# Patient Record
Sex: Male | Born: 1950 | Race: White | Hispanic: No | Smoking: Former smoker
Health system: Southern US, Community
[De-identification: ages and names within clinical notes are randomized; demographics above are authoritative.]

## PROBLEM LIST (undated history)

## (undated) DIAGNOSIS — I509 Heart failure, unspecified: Secondary | ICD-10-CM

## (undated) DIAGNOSIS — E119 Type 2 diabetes mellitus without complications: Secondary | ICD-10-CM

## (undated) DIAGNOSIS — M419 Scoliosis, unspecified: Secondary | ICD-10-CM

## (undated) DIAGNOSIS — I10 Essential (primary) hypertension: Secondary | ICD-10-CM

## (undated) DIAGNOSIS — E039 Hypothyroidism, unspecified: Secondary | ICD-10-CM

## (undated) DIAGNOSIS — T8859XA Other complications of anesthesia, initial encounter: Secondary | ICD-10-CM

## (undated) DIAGNOSIS — J45909 Unspecified asthma, uncomplicated: Secondary | ICD-10-CM

## (undated) DIAGNOSIS — Z8619 Personal history of other infectious and parasitic diseases: Secondary | ICD-10-CM

## (undated) DIAGNOSIS — E0590 Thyrotoxicosis, unspecified without thyrotoxic crisis or storm: Secondary | ICD-10-CM

## (undated) DIAGNOSIS — M545 Low back pain, unspecified: Secondary | ICD-10-CM

## (undated) DIAGNOSIS — N289 Disorder of kidney and ureter, unspecified: Secondary | ICD-10-CM

## (undated) DIAGNOSIS — Z85828 Personal history of other malignant neoplasm of skin: Secondary | ICD-10-CM

## (undated) DIAGNOSIS — G629 Polyneuropathy, unspecified: Secondary | ICD-10-CM

## (undated) DIAGNOSIS — G8929 Other chronic pain: Secondary | ICD-10-CM

## (undated) DIAGNOSIS — G4730 Sleep apnea, unspecified: Secondary | ICD-10-CM

## (undated) HISTORY — DX: Disorder of kidney and ureter, unspecified: N28.9

## (undated) HISTORY — DX: Thyrotoxicosis, unspecified without thyrotoxic crisis or storm: E05.90

## (undated) HISTORY — DX: Scoliosis, unspecified: M41.9

## (undated) HISTORY — PX: TONSILLECTOMY AND ADENOIDECTOMY: SUR1326

## (undated) HISTORY — DX: Essential (primary) hypertension: I10

## (undated) HISTORY — DX: Sleep apnea, unspecified: G47.30

## (undated) HISTORY — DX: Personal history of other malignant neoplasm of skin: Z85.828

## (undated) HISTORY — DX: Personal history of other infectious and parasitic diseases: Z86.19

---
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
## 2003-04-25 HISTORY — PX: NASAL SINUS SURGERY: SHX719

## 2004-01-28 ENCOUNTER — Ambulatory Visit: Payer: Self-pay | Admitting: Family Medicine

## 2004-06-16 ENCOUNTER — Other Ambulatory Visit: Payer: Self-pay

## 2004-06-23 ENCOUNTER — Ambulatory Visit: Payer: Self-pay | Admitting: Otolaryngology

## 2004-10-06 ENCOUNTER — Ambulatory Visit: Payer: Self-pay | Admitting: Otolaryngology

## 2005-01-06 ENCOUNTER — Ambulatory Visit: Payer: Self-pay | Admitting: Unknown Physician Specialty

## 2005-01-06 LAB — HM COLONOSCOPY

## 2009-10-27 ENCOUNTER — Ambulatory Visit: Payer: Self-pay | Admitting: Family Medicine

## 2010-02-10 ENCOUNTER — Ambulatory Visit: Payer: Self-pay | Admitting: Family Medicine

## 2011-02-09 ENCOUNTER — Observation Stay: Payer: Self-pay | Admitting: Internal Medicine

## 2011-06-06 ENCOUNTER — Ambulatory Visit: Payer: Self-pay | Admitting: Family Medicine

## 2011-06-21 ENCOUNTER — Ambulatory Visit: Payer: Self-pay | Admitting: Family Medicine

## 2011-07-25 ENCOUNTER — Encounter: Payer: Self-pay | Admitting: Family Medicine

## 2011-08-01 DIAGNOSIS — C44320 Squamous cell carcinoma of skin of unspecified parts of face: Secondary | ICD-10-CM | POA: Insufficient documentation

## 2011-08-01 DIAGNOSIS — C4330 Malignant melanoma of unspecified part of face: Secondary | ICD-10-CM | POA: Insufficient documentation

## 2011-08-23 ENCOUNTER — Encounter: Payer: Self-pay | Admitting: Family Medicine

## 2011-09-23 ENCOUNTER — Encounter: Payer: Self-pay | Admitting: Family Medicine

## 2011-10-23 ENCOUNTER — Encounter: Payer: Self-pay | Admitting: Family Medicine

## 2011-11-08 ENCOUNTER — Ambulatory Visit: Payer: Self-pay | Admitting: Family Medicine

## 2011-11-28 DIAGNOSIS — M419 Scoliosis, unspecified: Secondary | ICD-10-CM | POA: Insufficient documentation

## 2013-05-29 ENCOUNTER — Ambulatory Visit (INDEPENDENT_AMBULATORY_CARE_PROVIDER_SITE_OTHER): Payer: Medicare Other | Admitting: Pulmonary Disease

## 2013-05-29 ENCOUNTER — Encounter (INDEPENDENT_AMBULATORY_CARE_PROVIDER_SITE_OTHER): Payer: Self-pay

## 2013-05-29 ENCOUNTER — Encounter: Payer: Self-pay | Admitting: Pulmonary Disease

## 2013-05-29 VITALS — BP 138/90 | HR 81 | Ht 68.0 in | Wt 298.0 lb

## 2013-05-29 DIAGNOSIS — G4733 Obstructive sleep apnea (adult) (pediatric): Secondary | ICD-10-CM

## 2013-05-29 DIAGNOSIS — E669 Obesity, unspecified: Secondary | ICD-10-CM | POA: Insufficient documentation

## 2013-05-29 DIAGNOSIS — Z9989 Dependence on other enabling machines and devices: Secondary | ICD-10-CM

## 2013-05-29 DIAGNOSIS — J45909 Unspecified asthma, uncomplicated: Secondary | ICD-10-CM | POA: Insufficient documentation

## 2013-05-29 DIAGNOSIS — R0602 Shortness of breath: Secondary | ICD-10-CM

## 2013-05-29 NOTE — Assessment & Plan Note (Signed)
He has gained 100 pounds since his last CPAP titration study. He still has symptoms of obstructive sleep apnea including daytime somnolence.  Plan: -We need to repeat CPAP titration study to ensure that the pressure is adequate.

## 2013-05-29 NOTE — Progress Notes (Signed)
Subjective:    Patient ID: Jacob Herrera, male    DOB: [birth year], 63 y.o.   MRN: [id number]  HPI  [name] has been having trouble getting a deep breath for about 3-4 months.  He had a breathing test in his PCP's office which didn't show COPD.  He was referred to me for further evaluation.  He often wakes up in the middle of night gasping for air.  He used to be around 200 lbs, but in recent years he has gained about 90 lbs.  He has a lot of back pain and so he can't exercise so his weight has gone up.  He has had pain in his joints in his hands and joints, but he has not had frank weakness other than the weakness in his legs.  He has been told that the weakness in his legs is due to the spine problems.    He has had a lot of sinus infections and had sinus surgery in the past.  He can breath through his lungs.  He does cough and produce mucus in the mornings, but he doesn't usually cough during the daytime.  He smoked 1 ppd, maybe more but quit 40 years ago after about 15-17 years.    His childhood was noral without respiratory problems.  He has had walking pneumonia, and annual episodes of bronchitis.  He wa told that he had asthmatic bronchitis and was treated with prednisone and an antibiotic.    He notes that when he walks to the mailbox he gets back pain and shortness of breath and leg weakness.  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  He was evaluated by a neurosurgeon at [facility] for this who recommended weight loss alone and no surgery.  He went to rehab but he couldn't tolerate the exercise due to pain.  He lost about ten pounds.  He hasn't tried any other diet or weight loss plan.    Past Medical History  Diagnosis Date  . Hypertension   . Scoliosis   . Diabetes   . Hx of skin cancer, basal cell   . Sinus trouble   . Kidney disease   . Sleep apnea      Family History  Problem Relation Age of Onset  . Emphysema Father   . Cancer - Lung  Father   . Cancer - Ovarian Mother      History   Social History  . Marital Status: Single    Spouse Name: N/A    Number of Children: N/A  . Years of Education: N/A   Occupational History  . Not on file.   Social History Main Topics  . Smoking status: Former Smoker -- 1.00 packs/day for 25 years    Types: Cigarettes    Quit date: 05/29/1988  . Smokeless tobacco: Current User    Types: Chew     Comment: chews 1 bag of loose leaf chew/week  . Alcohol Use: Yes     Comment: moderate drinking per pt  . Drug Use: No  . Sexual Activity: Not on file   Other Topics Concern  . Not on file   Social History Narrative  . No narrative on file     Not on File   No outpatient prescriptions prior to visit.   No facility-administered medications prior to visit.      Review of Systems  Constitutional: Positive for unexpected weight change. Negative for fever.  HENT:  Negative for congestion, dental problem, ear pain, nosebleeds, postnasal drip, rhinorrhea, sinus pressure, sneezing, sore throat and trouble swallowing.   Eyes: Negative for redness and itching.  Respiratory: Positive for cough and shortness of breath. Negative for chest tightness and wheezing.   Cardiovascular: Negative for palpitations and leg swelling.  Gastrointestinal: Negative for nausea and vomiting.  Genitourinary: Negative for dysuria.  Musculoskeletal: Positive for joint swelling.  Skin: Negative for rash.  Neurological: Negative for headaches.  Hematological: Does not bruise/bleed easily.  Psychiatric/Behavioral: Negative for dysphoric mood. The patient is not nervous/anxious.        Objective:   Physical Exam  Filed Vitals:   05/29/13 1446  BP: 138/90  Pulse: 81  Height: 5\' 8"  (1.727 m)  Weight: 298 lb (135.172 kg)  SpO2: 95%  RA  Walked 500 feet on RA and stayed above 88%  Gen: obese, no acute distress HEENT: NCAT, PERRL, EOMi, OP clear, neck supple without masses PULM: Few crackles in  bases CV: RRR, no mgr, no JVD AB: BS+, soft, nontender, no hsm Ext: warm, no edema, no clubbing, no cyanosis Derm: no rash or skin breakdown Neuro: A&Ox4, CN II-XII intact, strength 5/5 in all 4 extremities  2013 chest x-ray> no frank parenchymal abnormality, I question pulmonary vascular hypertension.     Assessment & Plan:   Shortness of breath I explained to Remo Lipps today that I think the most likely cause for his shortness of breath is his obesity. His recent spirometry from his doctor's office was suggestive of restriction but certainly diagnostic diagnostic since there were no lung volumes. I did not see clear airflow obstruction. He has been told on occasion that he has asthmatic bronchitis and this may be true. Sometimes you can see asthma with normal spirometry.  I am going to give him a trial of Dulera to see if that helps with his shortness of breath. However, I think with the help him the most is to lose weight. We will get a set of full pulmonary function testing and a new chest x-ray to rule out any other pathology such as scarring or fibrosis.  I would also like to know the results of his cardiac evaluation including an echocardiogram from 2013.  Plan: -Obtain records from 2013 hospitalization including stress test and echocardiogram -Full pulmonary function testing -Chest x-ray -Advised to lose weight -Trial of Dulera 2 puffs twice a day.  OSA on CPAP He has gained 100 pounds since his last CPAP titration study. He still has symptoms of obstructive sleep apnea including daytime somnolence.  Plan: -We need to repeat CPAP titration study to ensure that the pressure is adequate.  Obesity, unspecified I advised him today at length to try to exercise and lose weight more. Unfortunately this is limited by severe back pain.   Updated Medication List Outpatient Encounter Prescriptions as of 05/29/2013  Medication Sig  . albuterol (PROVENTIL HFA;VENTOLIN HFA) 108 (90 BASE)  MCG/ACT inhaler Inhale 2 puffs into the lungs every 4 (four) hours as needed for wheezing or shortness of breath.  [name] Kitchen amLODipine-benazepril (LOTREL) 10-40 MG per capsule Take 1 capsule by mouth 2 (two) times daily.  [name] Kitchen aspirin 81 MG tablet Take 81 mg by mouth daily.  [name] Kitchen atenolol (TENORMIN) 50 MG tablet Take 50 mg by mouth daily.  [name] Kitchen atorvastatin (LIPITOR) 20 MG tablet Take 20 mg by mouth daily.  . furosemide (LASIX) 20 MG tablet Take 20 mg by mouth 2 (two) times daily.  [name] Kitchen glipiZIDE (GLUCOTROL XL) 10  MG 24 hr tablet Take 10 mg by mouth daily with breakfast.  . levothyroxine (SYNTHROID, LEVOTHROID) 100 MCG tablet Take 100 mcg by mouth daily before breakfast.  . Lysine 500 MG CAPS Take 1 capsule by mouth daily.  . metFORMIN (GLUCOPHAGE) 500 MG tablet Take by mouth 2 (two) times daily with a meal.  . Multiple Vitamins-Minerals ([employer] SPECTRAVITE ADULT 50+ PO) Take 1 tablet by mouth daily.  . sitaGLIPtin (JANUVIA) 100 MG tablet Take 100 mg by mouth daily.  . tamsulosin (FLOMAX) 0.4 MG CAPS capsule Take 0.4 mg by mouth daily.

## 2013-05-29 NOTE — Assessment & Plan Note (Signed)
I explained to Remo Lipps today that I think the most likely cause for his shortness of breath is his obesity. His recent spirometry from his doctor's office was suggestive of restriction but certainly diagnostic diagnostic since there were no lung volumes. I did not see clear airflow obstruction. He has been told on occasion that he has asthmatic bronchitis and this may be true. Sometimes you can see asthma with normal spirometry.  I am going to give him a trial of Dulera to see if that helps with his shortness of breath. However, I think with the help him the most is to lose weight. We will get a set of full pulmonary function testing and a new chest x-ray to rule out any other pathology such as scarring or fibrosis.  I would also like to know the results of his cardiac evaluation including an echocardiogram from 2013.  Plan: -Obtain records from 2013 hospitalization including stress test and echocardiogram -Full pulmonary function testing -Chest x-ray -Advised to lose weight -Trial of Dulera 2 puffs twice a day.

## 2013-05-29 NOTE — Patient Instructions (Signed)
We will get the records from your hospital stay in 2013 We will get a lung function test and chest x-ray We will set up a CPAP titration study Use the Dulera inhaler two puffs twice a day no matter how you feel We will see you back in 3-4 weeks or sooner if needed

## 2013-05-29 NOTE — Assessment & Plan Note (Signed)
I advised him today at length to try to exercise and lose weight more. Unfortunately this is limited by severe back pain.

## 2013-05-30 DIAGNOSIS — R350 Frequency of micturition: Secondary | ICD-10-CM | POA: Insufficient documentation

## 2013-05-30 DIAGNOSIS — N3941 Urge incontinence: Secondary | ICD-10-CM | POA: Insufficient documentation

## 2013-05-30 DIAGNOSIS — R351 Nocturia: Secondary | ICD-10-CM | POA: Insufficient documentation

## 2013-06-05 ENCOUNTER — Ambulatory Visit: Payer: Self-pay | Admitting: Pulmonary Disease

## 2013-06-05 LAB — PULMONARY FUNCTION TEST

## 2013-06-11 ENCOUNTER — Encounter: Payer: Self-pay | Admitting: Pulmonary Disease

## 2013-06-12 ENCOUNTER — Telehealth: Payer: Self-pay

## 2013-06-12 NOTE — Telephone Encounter (Signed)
Pt is aware of results and recs.  Nothing further needed at this time.

## 2013-06-12 NOTE — Telephone Encounter (Signed)
Message copied by Len Blalock on Thu Jun 12, 2013  1:30 PM ------      Message from: Simonne Maffucci B      Created: Wed Jun 11, 2013 10:52 PM       A,      Please let him know that his PFTs showed severe restriction in his ability to take a deep breath due to his obesity.            Thanks      B ------

## 2013-06-23 ENCOUNTER — Ambulatory Visit (INDEPENDENT_AMBULATORY_CARE_PROVIDER_SITE_OTHER): Payer: Medicare Other | Admitting: Pulmonary Disease

## 2013-06-23 ENCOUNTER — Encounter: Payer: Self-pay | Admitting: Pulmonary Disease

## 2013-06-23 VITALS — BP 144/86 | HR 82 | Ht 68.0 in | Wt 299.0 lb

## 2013-06-23 DIAGNOSIS — E669 Obesity, unspecified: Secondary | ICD-10-CM

## 2013-06-23 DIAGNOSIS — J45909 Unspecified asthma, uncomplicated: Secondary | ICD-10-CM

## 2013-06-23 MED ORDER — BUDESONIDE-FORMOTEROL FUMARATE 80-4.5 MCG/ACT IN AERO
2.0000 | INHALATION_SPRAY | Freq: Two times a day (BID) | RESPIRATORY_TRACT | Status: DC
Start: 2013-06-23 — End: 2014-02-02

## 2013-06-23 NOTE — Assessment & Plan Note (Signed)
Exercises is going to be difficult considering his back pain and knee pain.  Plan: -I recommended that he join [employer]

## 2013-06-23 NOTE — Patient Instructions (Signed)
Your insurance company prefers that you take symbicort 2 puffs twice a day, use this instead of the [facility] (they are equivalent) Do your best to lose weight through diet We will see you back in 6 months or sooner if needed

## 2013-06-23 NOTE — Assessment & Plan Note (Addendum)
There is no significant obstruction seen on pulmonary function testing but he responded well to inhaled bronchodilator/inhale corticosteroid therapy. He has a past medical history significant for asthma with intermittent wheezing. Because he has done well, I am inclined to continue the therapy for now and hope that he'll be able to exercise more and lose more weight. It is not uncommon to see asthma and people who have normal pulmonary function testing.  What is encouraging to me is that he did not have severe airflow obstruction.  Clearly, his major issues are deconditioning and obesity. We spent a long time today talking about his obesity and weight loss strategies.  Plan: -Lose weight, lose weight, lose weight -Symbicort medium dose 2 puffs twice a day -Followup 6 months

## 2013-06-23 NOTE — Progress Notes (Signed)
Subjective:    Patient ID: Jacob Herrera, male    DOB: 1950/05/14, 63 y.o.   MRN: [id number]  Synopsis: Jacob Herrera first saw the [facility] pulmonary clinic in February 2015 for evaluation of shortness of breath. He had a past medical history significant for asthma. Pulmonary function testing was normal with the exception of restriction secondary to obesity. He was started on a combination long acting bronchodilator/inhaled corticosteroid inhaler and he noted significant benefit from this.  HPI  06/23/2013 ROV> Jacob Herrera took the [facility] inhaler we gave him and he feels like his dypsnea has been beer. He feels like he could take a deeper breath than before and he doesn't wake up starving for oxygen throughout the night.  He is on a different prostate pill and is only up once per night.   Past Medical History  Diagnosis Date  . Hypertension   . Scoliosis   . Diabetes   . Hx of skin cancer, basal cell   . Sinus trouble   . Kidney disease   . Sleep apnea      Review of Systems  Constitutional: Positive for fatigue. Negative for fever and chills.  Respiratory: Positive for shortness of breath. Negative for chest tightness and wheezing.   Cardiovascular: Positive for leg swelling. Negative for chest pain and palpitations.  Musculoskeletal: Positive for arthralgias, back pain and joint swelling.       Objective:   Physical Exam Filed Vitals:   06/23/13 1517  BP: 144/86  Pulse: 82  Height: 5\' 8"  (1.727 m)  Weight: 299 lb (135.626 kg)  SpO2: 94%  RA   Gen: Morbidly obese, chronically ill appearing, no acute distress HEENT: NCAT, EOMi, OP clear, neck supple without masses PULM: CTA B CV: RRR, no mgr, no JVD AB: BS+, soft, nontender, no hsm Ext: warm,  no clubbing, no cyanosis  05/2013 Full FPT > ratio 82%, FEV1 1.95L (67% pred), no change with BD, TLC 3.91 L (64% pred), ERV 0.26L (18% pred), DLCO 15.6 (54% pred)     Assessment & Plan:   Asthma, chronic There is no  significant obstruction seen on pulmonary function testing but he responded well to inhaled bronchodilator/inhale corticosteroid therapy. He has a past medical history significant for asthma with intermittent wheezing. Because he has done well, I am inclined to continue the therapy for now and hope that he'll be able to exercise more and lose more weight. It is not uncommon to see asthma and people who have normal pulmonary function testing.  What is encouraging to me is that he did not have severe airflow obstruction.  Clearly, his major issues are deconditioning and obesity. We spent a long time today talking about his obesity and weight loss strategies.  Plan: -Lose weight, lose weight, lose weight -Symbicort medium dose 2 puffs twice a day -Followup 6 months  Obesity, unspecified Exercises is going to be difficult considering his back pain and knee pain.  Plan: -I recommended that he join Weight Watchers    Updated Medication List Outpatient Encounter Prescriptions as of 06/23/2013  Medication Sig  . albuterol (PROVENTIL HFA;VENTOLIN HFA) 108 (90 BASE) MCG/ACT inhaler Inhale 2 puffs into the lungs every 4 (four) hours as needed for wheezing or shortness of breath.  [name] Kitchen amLODipine-benazepril (LOTREL) 10-40 MG per capsule Take 1 capsule by mouth 2 (two) times daily.  [name] Kitchen aspirin 81 MG tablet Take 81 mg by mouth daily.  [name] Kitchen atenolol (TENORMIN) 50 MG tablet Take 50 mg by mouth daily.  [name] Kitchen  atorvastatin (LIPITOR) 20 MG tablet Take 20 mg by mouth daily.  . furosemide (LASIX) 20 MG tablet Take 20 mg by mouth 2 (two) times daily.  [name] Kitchen glipiZIDE (GLUCOTROL XL) 10 MG 24 hr tablet Take 10 mg by mouth daily with breakfast.  . levothyroxine (SYNTHROID, LEVOTHROID) 100 MCG tablet Take 100 mcg by mouth daily before breakfast.  . Lysine 500 MG CAPS Take 1 capsule by mouth daily.  . metFORMIN (GLUCOPHAGE) 500 MG tablet Take by mouth 2 (two) times daily with a meal.  . Multiple Vitamins-Minerals ([employer]S SPECTRAVITE  ADULT 50+ PO) Take 1 tablet by mouth daily.  . sitaGLIPtin (JANUVIA) 100 MG tablet Take 100 mg by mouth daily.  . solifenacin (VESICARE) 5 MG tablet Take 5 mg by mouth daily.  . tamsulosin (FLOMAX) 0.4 MG CAPS capsule Take 0.4 mg by mouth daily.  . budesonide-formoterol (SYMBICORT) 80-4.5 MCG/ACT inhaler Inhale 2 puffs into the lungs 2 (two) times daily.

## 2013-06-24 ENCOUNTER — Encounter: Payer: Self-pay | Admitting: Pulmonary Disease

## 2013-06-24 DIAGNOSIS — N401 Enlarged prostate with lower urinary tract symptoms: Secondary | ICD-10-CM

## 2013-06-24 DIAGNOSIS — N138 Other obstructive and reflux uropathy: Secondary | ICD-10-CM | POA: Insufficient documentation

## 2013-07-04 ENCOUNTER — Other Ambulatory Visit: Payer: Self-pay | Admitting: Nephrology

## 2013-07-04 LAB — CBC WITH DIFFERENTIAL/PLATELET
Basophil #: 0.1 10*3/uL (ref 0.0–0.1)
Basophil %: 1.4 %
Eosinophil #: 0.2 10*3/uL (ref 0.0–0.7)
Eosinophil %: 2.6 %
HCT: 42.9 % (ref 40.0–52.0)
HGB: 14.2 g/dL (ref 13.0–18.0)
Lymphocyte #: 2.2 10*3/uL (ref 1.0–3.6)
Lymphocyte %: 27 %
MCH: 30.7 pg (ref 26.0–34.0)
MCHC: 33.1 g/dL (ref 32.0–36.0)
MCV: 93 fL (ref 80–100)
Monocyte #: 0.5 x10 3/mm (ref 0.2–1.0)
Monocyte %: 6.6 %
Neutrophil #: 5.1 10*3/uL (ref 1.4–6.5)
Neutrophil %: 62.4 %
Platelet: 229 10*3/uL (ref 150–440)
RBC: 4.63 10*6/uL (ref 4.40–5.90)
RDW: 14 % (ref 11.5–14.5)
WBC: 8.2 10*3/uL (ref 3.8–10.6)

## 2013-07-04 LAB — COMPREHENSIVE METABOLIC PANEL
Albumin: 3.3 g/dL — ABNORMAL LOW (ref 3.4–5.0)
Alkaline Phosphatase: 53 U/L
Anion Gap: 3 — ABNORMAL LOW (ref 7–16)
BUN: 17 mg/dL (ref 7–18)
Bilirubin,Total: 0.4 mg/dL (ref 0.2–1.0)
Calcium, Total: 9.3 mg/dL (ref 8.5–10.1)
Chloride: 99 mmol/L (ref 98–107)
Co2: 31 mmol/L (ref 21–32)
Creatinine: 1.44 mg/dL — ABNORMAL HIGH (ref 0.60–1.30)
EGFR (African American): 59 — ABNORMAL LOW
EGFR (Non-African Amer.): 51 — ABNORMAL LOW
Glucose: 201 mg/dL — ABNORMAL HIGH (ref 65–99)
Osmolality: 274 (ref 275–301)
Potassium: 4.6 mmol/L (ref 3.5–5.1)
SGOT(AST): 32 U/L (ref 15–37)
SGPT (ALT): 34 U/L (ref 12–78)
Sodium: 133 mmol/L — ABNORMAL LOW (ref 136–145)
Total Protein: 7.5 g/dL (ref 6.4–8.2)

## 2013-07-04 LAB — URINALYSIS, COMPLETE
Bilirubin,UR: NEGATIVE
Blood: NEGATIVE
Glucose,UR: 50 mg/dL (ref 0–75)
Ketone: NEGATIVE
Leukocyte Esterase: NEGATIVE
Nitrite: NEGATIVE
Ph: 5 (ref 4.5–8.0)
Protein: >=500
RBC,UR: 1 /HPF (ref 0–5)
Specific Gravity: 1.02 (ref 1.003–1.030)
Squamous Epithelial: NONE SEEN
WBC UR: 2 /HPF (ref 0–5)

## 2013-07-04 LAB — APTT: Activated PTT: 29.1 secs (ref 23.6–35.9)

## 2013-07-04 LAB — PROTEIN / CREATININE RATIO, URINE
Creatinine, Urine: 152.8 mg/dL — ABNORMAL HIGH (ref 30.0–125.0)
Protein, Random Urine: 1013 mg/dL — ABNORMAL HIGH (ref 0–12)
Protein/Creat. Ratio: 6630 mg/gCREAT — ABNORMAL HIGH (ref 0–200)

## 2013-07-04 LAB — PROTIME-INR
INR: 1
Prothrombin Time: 12.8 secs (ref 11.5–14.7)

## 2013-07-07 ENCOUNTER — Observation Stay: Payer: Self-pay | Admitting: Nephrology

## 2013-07-07 LAB — CBC WITH DIFFERENTIAL/PLATELET
Basophil #: 0.1 10*3/uL (ref 0.0–0.1)
Basophil %: 1.2 %
Eosinophil #: 0.2 10*3/uL (ref 0.0–0.7)
Eosinophil %: 2.6 %
HCT: 43.3 % (ref 40.0–52.0)
HGB: 14 g/dL (ref 13.0–18.0)
Lymphocyte #: 2.8 10*3/uL (ref 1.0–3.6)
Lymphocyte %: 29.2 %
MCH: 29.9 pg (ref 26.0–34.0)
MCHC: 32.4 g/dL (ref 32.0–36.0)
MCV: 92 fL (ref 80–100)
Monocyte #: 0.8 x10 3/mm (ref 0.2–1.0)
Monocyte %: 7.8 %
Neutrophil #: 5.7 10*3/uL (ref 1.4–6.5)
Neutrophil %: 59.2 %
Platelet: 219 10*3/uL (ref 150–440)
RBC: 4.7 10*6/uL (ref 4.40–5.90)
RDW: 14.4 % (ref 11.5–14.5)
WBC: 9.6 10*3/uL (ref 3.8–10.6)

## 2013-07-08 LAB — CBC WITH DIFFERENTIAL/PLATELET
Basophil #: 0.1 10*3/uL (ref 0.0–0.1)
Basophil %: 0.8 %
Eosinophil #: 0.2 10*3/uL (ref 0.0–0.7)
Eosinophil %: 2.4 %
HCT: 41.7 % (ref 40.0–52.0)
HGB: 13.5 g/dL (ref 13.0–18.0)
Lymphocyte #: 2.5 10*3/uL (ref 1.0–3.6)
Lymphocyte %: 31 %
MCH: 30 pg (ref 26.0–34.0)
MCHC: 32.4 g/dL (ref 32.0–36.0)
MCV: 93 fL (ref 80–100)
Monocyte #: 0.4 x10 3/mm (ref 0.2–1.0)
Monocyte %: 5.5 %
Neutrophil #: 4.8 10*3/uL (ref 1.4–6.5)
Neutrophil %: 60.3 %
Platelet: 220 10*3/uL (ref 150–440)
RBC: 4.5 10*6/uL (ref 4.40–5.90)
RDW: 14.2 % (ref 11.5–14.5)
WBC: 8 10*3/uL (ref 3.8–10.6)

## 2013-07-08 LAB — BASIC METABOLIC PANEL
Anion Gap: 3 — ABNORMAL LOW (ref 7–16)
BUN: 20 mg/dL — ABNORMAL HIGH (ref 7–18)
Calcium, Total: 9.3 mg/dL (ref 8.5–10.1)
Chloride: 102 mmol/L (ref 98–107)
Co2: 31 mmol/L (ref 21–32)
Creatinine: 1.36 mg/dL — ABNORMAL HIGH (ref 0.60–1.30)
EGFR (African American): >60
EGFR (Non-African Amer.): 54 — ABNORMAL LOW
Glucose: 175 mg/dL — ABNORMAL HIGH (ref 65–99)
Osmolality: 279 (ref 275–301)
Potassium: 4 mmol/L (ref 3.5–5.1)
Sodium: 136 mmol/L (ref 136–145)

## 2013-07-12 ENCOUNTER — Encounter: Payer: Self-pay | Admitting: Pulmonary Disease

## 2013-07-17 ENCOUNTER — Encounter: Payer: Self-pay | Admitting: Pulmonary Disease

## 2013-11-19 ENCOUNTER — Ambulatory Visit: Payer: Self-pay | Admitting: Pain Medicine

## 2013-11-26 ENCOUNTER — Ambulatory Visit: Payer: Self-pay | Admitting: Pain Medicine

## 2013-12-23 ENCOUNTER — Ambulatory Visit: Payer: Self-pay | Admitting: Pain Medicine

## 2014-01-22 ENCOUNTER — Ambulatory Visit: Payer: Self-pay | Admitting: Pain Medicine

## 2014-02-02 ENCOUNTER — Encounter: Payer: Self-pay | Admitting: Pulmonary Disease

## 2014-02-02 ENCOUNTER — Ambulatory Visit (INDEPENDENT_AMBULATORY_CARE_PROVIDER_SITE_OTHER): Payer: Medicare Other | Admitting: Pulmonary Disease

## 2014-02-02 VITALS — BP 146/88 | HR 80 | Ht 68.0 in | Wt 288.0 lb

## 2014-02-02 DIAGNOSIS — J4530 Mild persistent asthma, uncomplicated: Secondary | ICD-10-CM

## 2014-02-02 MED ORDER — BUDESONIDE-FORMOTEROL FUMARATE 80-4.5 MCG/ACT IN AERO: 2.0000 | INHALATION_SPRAY | Freq: Two times a day (BID) | RESPIRATORY_TRACT | Status: DC

## 2014-02-02 NOTE — Assessment & Plan Note (Addendum)
Jacob Herrera has been experiencing more dyspnea and wheezing lately mostly because he has not been using his Symbicort.  He is not taking the medication for financial reasons.  Certainly a lot of his shortness of breath is related to deconditioning and obesity.  His flu shot is up-to-date  Plan: -Restart Symbicort -We will apply for financial assistance for the Symbicort -I encouraged him to rinse out mouth after using Symbicort -if the Symbicort is still too expensive after the financial assistance then we can change him to Pulmicort and Brovana -Stay active, keep losing weight -Followup 6 months

## 2014-02-02 NOTE — Progress Notes (Signed)
Subjective:    Patient ID: Jacob Herrera, male    DOB: 1951-01-23, 63 y.o.   MRN: [id number]  Synopsis: Jacob Herrera first saw the [facility] pulmonary clinic in February 2015 for evaluation of shortness of breath. He had a past medical history significant for asthma. Pulmonary function testing was normal with the exception of restriction secondary to obesity. He was started on a combination long acting bronchodilator/inhaled corticosteroid inhaler and he noted significant benefit from this.  HPI  Chief Complaint  Patient presents with  . Follow-up    Pt has been struggling to keep his glucose down, was in the 400's until being put on insulin X6 weeks ago.  C/o SOB with exertion, dificulty taking in a deep breath.      02/02/2014 ROV > Jacob Herrera has lost about 11 pounds since the last visit.  He has been having a lot of back and joint pain since the last visit.  He has been having nerve block type procedures on his lumbar spine lately.  The last one helped for a about 4-6 weeks. He had some injections in his knee which helped a lot.  His blood sugar has been higher.  He has not been prescribed prednisone, but apparentl the injection in his back was a steroid injection.  This has controlled his blood sugar better.   His breathing has been OK during this time.  He says that every once in a while he will feel some shortness of breath, primarily when sleeping at night.  He will wake up in the middle of the night with dypnea.  He will wheeze sometimes when this happens.  Sinus drainage will make this worse.  He has not taken the symbicort in several months because of the cost of the medicine. His flu shot is up to date and he was given a pneumonia shot with it 4 weeks ago.  Past Medical History  Diagnosis Date  . Hypertension   . Scoliosis   . Diabetes   . Hx of skin cancer, basal cell   . Sinus trouble   . Kidney disease   . Sleep apnea      Review of Systems  Constitutional:  Positive for fatigue. Negative for fever and chills.  Respiratory: Positive for shortness of breath. Negative for chest tightness and wheezing.   Cardiovascular: Positive for leg swelling. Negative for chest pain and palpitations.  Musculoskeletal: Positive for arthralgias, back pain and joint swelling.       Objective:   Physical Exam  Filed Vitals:   02/02/14 1132  BP: 146/88  Pulse: 80  Height: 5\' 8"  (1.727 m)  Weight: 288 lb (130.636 kg)  SpO2: 96%  RA   Gen: Morbidly obese, chronically ill appearing, no acute distress HEENT: NCAT, EOMi, OP clear, neck supple without masses PULM: CTA B CV: RRR, no mgr, no JVD AB: BS+, soft, nontender Ext: warm,  no clubbing, no cyanosis  05/2013 Full FPT > ratio 82%, FEV1 1.95L (67% pred), no change with BD, TLC 3.91 L (64% pred), ERV 0.26L (18% pred), DLCO 15.6 (54% pred)     Assessment & Plan:   Asthma, chronic Jacob Herrera has been experiencing more dyspnea and wheezing lately mostly because he has not been using his Symbicort.  He is not taking the medication for financial reasons.  Certainly a lot of his shortness of breath is related to deconditioning and obesity.  His flu shot is up-to-date  Plan: -Restart Symbicort -We will apply for  financial assistance for the Symbicort -I encouraged him to rinse out mouth after using Symbicort -if the Symbicort is still too expensive after the financial assistance then we can change him to Pulmicort and Brovana -Stay active, keep losing weight -Followup 6 months    Updated Medication List Outpatient Encounter Prescriptions as of 02/02/2014  Medication Sig  . acetaminophen (TYLENOL) 500 MG tablet Take 500 mg by mouth every 6 (six) hours as needed.  [name] Kitchen albuterol (PROVENTIL HFA;VENTOLIN HFA) 108 (90 BASE) MCG/ACT inhaler Inhale 2 puffs into the lungs every 4 (four) hours as needed for wheezing or shortness of breath.  [name] Kitchen amLODipine-benazepril (LOTREL) 10-40 MG per capsule Take 1 capsule by  mouth 2 (two) times daily.  [name] Kitchen aspirin 81 MG tablet Take 81 mg by mouth daily.  [name] Kitchen atorvastatin (LIPITOR) 20 MG tablet Take 20 mg by mouth daily.  . budesonide-formoterol (SYMBICORT) 80-4.5 MCG/ACT inhaler Inhale 2 puffs into the lungs 2 (two) times daily.  . carvedilol (COREG) 12.5 MG tablet Take 12.5 mg by mouth 2 (two) times daily with a meal.  . furosemide (LASIX) 20 MG tablet Take 20 mg by mouth 2 (two) times daily.  [name] Kitchen glipiZIDE (GLUCOTROL XL) 10 MG 24 hr tablet Take 10 mg by mouth daily with breakfast.  . HYDROcodone-acetaminophen (NORCO) 7.5-325 MG per tablet Take 1 tablet by mouth every 6 (six) hours as needed for moderate pain.  [name] Kitchen insulin aspart (NOVOLOG) 100 UNIT/ML injection Inject into the skin 3 (three) times daily before meals. Sliding scale based on glucose readings  . levothyroxine (SYNTHROID, LEVOTHROID) 100 MCG tablet Take 100 mcg by mouth daily before breakfast.  . Lysine 500 MG CAPS Take 1 capsule by mouth daily.  . metFORMIN (GLUCOPHAGE) 500 MG tablet Take by mouth 2 (two) times daily with a meal.  . Multiple Vitamins-Minerals (CVS SPECTRAVITE ADULT 50+ PO) Take 1 tablet by mouth daily.  [name] Kitchen omeprazole (PRILOSEC) 40 MG capsule Take 40 mg by mouth daily.  . pioglitazone (ACTOS) 30 MG tablet Take 30 mg by mouth daily.  . sitaGLIPtin (JANUVIA) 100 MG tablet Take 100 mg by mouth daily.  . solifenacin (VESICARE) 5 MG tablet Take 5 mg by mouth daily.  . tamsulosin (FLOMAX) 0.4 MG CAPS capsule Take 0.4 mg by mouth daily.  . traMADol (ULTRAM) 50 MG tablet Take 50 mg by mouth every 8 (eight) hours as needed.  . [DISCONTINUED] budesonide-formoterol (SYMBICORT) 80-4.5 MCG/ACT inhaler Inhale 2 puffs into the lungs 2 (two) times daily.  . [DISCONTINUED] atenolol (TENORMIN) 50 MG tablet Take 50 mg by mouth daily.

## 2014-02-02 NOTE — Patient Instructions (Addendum)
Resume the symbicort 2 puffs twice a day and apply for financial assistance for this Try to stay active to keep your weight down We will see you back in 6 months or sooner if needed

## 2014-02-04 ENCOUNTER — Ambulatory Visit: Payer: Self-pay | Admitting: Pain Medicine

## 2014-02-06 ENCOUNTER — Other Ambulatory Visit: Payer: Self-pay

## 2014-02-06 MED ORDER — BUDESONIDE-FORMOTEROL FUMARATE 80-4.5 MCG/ACT IN AERO: 2.0000 | INHALATION_SPRAY | Freq: Two times a day (BID) | RESPIRATORY_TRACT | Status: DC

## 2014-03-03 ENCOUNTER — Ambulatory Visit: Payer: Self-pay | Admitting: Pain Medicine

## 2014-03-09 ENCOUNTER — Ambulatory Visit: Payer: Self-pay | Admitting: Pain Medicine

## 2014-03-16 ENCOUNTER — Ambulatory Visit: Payer: Self-pay | Admitting: Family Medicine

## 2014-03-24 ENCOUNTER — Ambulatory Visit: Payer: Self-pay | Admitting: Family Medicine

## 2014-03-31 ENCOUNTER — Ambulatory Visit: Payer: Self-pay | Admitting: Pain Medicine

## 2014-04-08 ENCOUNTER — Ambulatory Visit: Payer: Self-pay | Admitting: Pain Medicine

## 2014-04-24 ENCOUNTER — Ambulatory Visit: Payer: Self-pay | Admitting: Family Medicine

## 2014-04-30 ENCOUNTER — Ambulatory Visit: Payer: Self-pay | Admitting: Pain Medicine

## 2014-05-20 LAB — LIPID PANEL
Cholesterol: 212 mg/dL — AB (ref 0–200)
HDL: 45 mg/dL (ref 35–70)
LDL Cholesterol: 114 mg/dL
Triglycerides: 263 mg/dL — AB (ref 40–160)

## 2014-05-20 LAB — TSH: TSH: 5.89 u[IU]/mL (ref <=5.90)

## 2014-06-01 ENCOUNTER — Ambulatory Visit: Payer: Self-pay | Admitting: Pain Medicine

## 2014-06-30 ENCOUNTER — Ambulatory Visit: Payer: Self-pay | Admitting: Pain Medicine

## 2014-07-08 ENCOUNTER — Ambulatory Visit: Payer: Self-pay | Admitting: Pain Medicine

## 2014-08-11 ENCOUNTER — Ambulatory Visit
Admit: 2014-08-11 | Disposition: A | Discharge status: Home / Self Care | Payer: Self-pay | Attending: Pain Medicine | Admitting: Pain Medicine

## 2014-08-12 ENCOUNTER — Encounter: Payer: Self-pay | Admitting: Pulmonary Disease

## 2014-08-12 ENCOUNTER — Ambulatory Visit (INDEPENDENT_AMBULATORY_CARE_PROVIDER_SITE_OTHER): Payer: PPO | Admitting: Pulmonary Disease

## 2014-08-12 ENCOUNTER — Encounter (INDEPENDENT_AMBULATORY_CARE_PROVIDER_SITE_OTHER): Payer: Self-pay

## 2014-08-12 VITALS — BP 138/76 | HR 88 | Ht 68.0 in | Wt 300.0 lb

## 2014-08-12 DIAGNOSIS — J4540 Moderate persistent asthma, uncomplicated: Secondary | ICD-10-CM | POA: Diagnosis not present

## 2014-08-12 DIAGNOSIS — G4733 Obstructive sleep apnea (adult) (pediatric): Secondary | ICD-10-CM | POA: Diagnosis not present

## 2014-08-12 DIAGNOSIS — Z9989 Dependence on other enabling machines and devices: Secondary | ICD-10-CM

## 2014-08-12 DIAGNOSIS — J3089 Other allergic rhinitis: Secondary | ICD-10-CM

## 2014-08-12 DIAGNOSIS — J309 Allergic rhinitis, unspecified: Secondary | ICD-10-CM | POA: Insufficient documentation

## 2014-08-12 MED ORDER — MOMETASONE FUROATE 50 MCG/ACT NA SUSP: 2.0000 | Freq: Every day | NASAL | Status: DC

## 2014-08-12 NOTE — Assessment & Plan Note (Signed)
This problem has been worsening recently and I have not previously evaluated him for this in the past. His symptoms are consistent with seasonal allergic rhinitis. No further workup as needed.  Plan: -Restart Nasonex, I will prescribe, he was given advice on the basic pharmacokinetics and appropriate use of this medication

## 2014-08-12 NOTE — Assessment & Plan Note (Signed)
This has been a stable interval for Jacob Herrera. He continues to use and benefit from his Symbicort.  Plan: -Continue Symbicort 2 puffs twice a day -Flu shot in the fall -Follow-up in 6 months

## 2014-08-12 NOTE — Patient Instructions (Signed)
Keep taking the Symbicort 2 puffs twice a day no matter how you feel Get a flu shot when they come out in the fall Take Nasonex 2 puffs each nostril daily Keep using her CPAP machine at night We will see you back in 6 months or sooner if needed

## 2014-08-12 NOTE — Assessment & Plan Note (Signed)
He has remained compliant with his CPAP therapy and he has minimal symptoms of fatigue.  Plan: -Continue CPAP daily at bedtime

## 2014-08-12 NOTE — Progress Notes (Signed)
Subjective:    Patient ID: Jacob Herrera, male    DOB: [birth year], 64 y.o.   MRN: [id number]  Synopsis: Jacob Herrera first saw the [facility] pulmonary clinic in February 2015 for evaluation of shortness of breath. He had a past medical history significant for asthma. Pulmonary function testing was normal with the exception of restriction secondary to obesity. He was started on a combination long acting bronchodilator/inhaled corticosteroid inhaler and he noted significant benefit from this.  HPI Chief Complaint  Patient presents with  . Follow-up    pt c/o sob with any exertion, nonprod cough with exertion.      Jacob Herrera has been doing well recently and enjoying the warm weather. He went fishing a couple of days ago.    He says that his breathing has been about the same recently.  He continues to have some dyspnea when he exerts himself, but he paces himself.  He has not limited his activity due to breathing.  He continues to take Symbicort and he has received assistance from the company for this.  He has a new IT consultant and he has been able to afford it now.  He still feels like it helps with his breathing and it clears up the mucus out of lungs.    He complains of sinus drainage, it has been a little worse lately with the pollen in the last few weeks.  He does not take anything for his sinus congestion or allergies.  It is not associated with fever or headache.  He continues to have back and knee pain which limits him significantly.  He says that even standing still causes pain in his back.  He is supposed to see Dr. Sherwood Gambler in [place] soon.  He continues to use his CPAP at night and is not having problems with it.  He sometimes takes a nap in the afternoon but he doesn't feel excessively sleepy.     Past Medical History  Diagnosis Date  . Hypertension   . Scoliosis   . Diabetes   . Hx of skin cancer, basal cell   . Sinus trouble   . Kidney disease   . Sleep  apnea      Review of Systems  Constitutional: Positive for fatigue. Negative for fever and chills.  HENT: Positive for postnasal drip, rhinorrhea and sinus pressure.   Respiratory: Negative for chest tightness, shortness of breath and wheezing.   Cardiovascular: Positive for leg swelling. Negative for chest pain and palpitations.  Musculoskeletal: Positive for back pain, joint swelling and arthralgias.       Objective:   Physical Exam Filed Vitals:   08/12/14 1159  BP: 138/76  Pulse: 88  Height: 5\' 8"  (1.727 m)  Weight: 300 lb (136.079 kg)  SpO2: 96%  RA  Gen: Chronically ill appearing, obese, no acute distress HENT: OP clear, TM's clear, neck supple PULM: CTA B, normal percussion CV: RRR, no mgr,  notable leg edema GI: BS+, soft, nontender Derm: no cyanosis or rash Psyche: normal mood and affect   05/2013 Full FPT > ratio 82%, FEV1 1.95L (67% pred), no change with BD, TLC 3.91 L (64% pred), ERV 0.26L (18% pred), DLCO 15.6 (54% pred)     Assessment & Plan:   Asthma, chronic This has been a stable interval for Jacob Herrera. He continues to use and benefit from his Symbicort.  Plan: -Continue Symbicort 2 puffs twice a day -Flu shot in the fall -Follow-up in 6 months  OSA on CPAP He has remained compliant with his CPAP therapy and he has minimal symptoms of fatigue.  Plan: -Continue CPAP daily at bedtime   Allergic rhinitis This problem has been worsening recently and I have not previously evaluated him for this in the past. His symptoms are consistent with seasonal allergic rhinitis. No further workup as needed.  Plan: -Restart Nasonex, I will prescribe, he was given advice on the basic pharmacokinetics and appropriate use of this medication     Updated Medication List Outpatient Encounter Prescriptions as of 08/12/2014  Medication Sig  . acetaminophen (TYLENOL) 500 MG tablet Take 500 mg by mouth every 6 (six) hours as needed.  [name] Kitchen albuterol (PROVENTIL  HFA;VENTOLIN HFA) 108 (90 BASE) MCG/ACT inhaler Inhale 2 puffs into the lungs every 4 (four) hours as needed for wheezing or shortness of breath.  [name] Kitchen amLODipine-benazepril (LOTREL) 10-40 MG per capsule Take 1 capsule by mouth 2 (two) times daily.  [name] Kitchen aspirin 81 MG tablet Take 81 mg by mouth daily.  [name] Kitchen atorvastatin (LIPITOR) 20 MG tablet Take 20 mg by mouth daily.  . budesonide-formoterol (SYMBICORT) 80-4.5 MCG/ACT inhaler Inhale 2 puffs into the lungs 2 (two) times daily.  . carvedilol (COREG) 12.5 MG tablet Take 12.5 mg by mouth 2 (two) times daily with a meal.  . furosemide (LASIX) 20 MG tablet Take 20 mg by mouth 2 (two) times daily.  [name] Kitchen glipiZIDE (GLUCOTROL XL) 10 MG 24 hr tablet Take 10 mg by mouth daily with breakfast.  . HYDROcodone-acetaminophen (NORCO) 7.5-325 MG per tablet Take 1 tablet by mouth every 6 (six) hours as needed for moderate pain.  [name] Kitchen insulin aspart (NOVOLOG) 100 UNIT/ML injection Inject into the skin 3 (three) times daily before meals. Sliding scale based on glucose readings  . levothyroxine (SYNTHROID, LEVOTHROID) 100 MCG tablet Take 100 mcg by mouth daily before breakfast.  . Lysine 500 MG CAPS Take 1 capsule by mouth daily.  . metFORMIN (GLUCOPHAGE) 500 MG tablet Take by mouth 2 (two) times daily with a meal.  . Multiple Vitamins-Minerals ([facility] SPECTRAVITE ADULT 50+ PO) Take 1 tablet by mouth daily.  [name] Kitchen omeprazole (PRILOSEC) 40 MG capsule Take 40 mg by mouth daily.  [name] Kitchen oxybutynin (DITROPAN) 5 MG tablet Take 5 mg by mouth 2 (two) times daily.  . pioglitazone (ACTOS) 30 MG tablet Take 30 mg by mouth daily.  . sitaGLIPtin (JANUVIA) 100 MG tablet Take 100 mg by mouth daily.  . tamsulosin (FLOMAX) 0.4 MG CAPS capsule Take 0.4 mg by mouth daily.  . traMADol (ULTRAM) 50 MG tablet Take 50 mg by mouth every 8 (eight) hours as needed.  . mometasone (NASONEX) 50 MCG/ACT nasal spray Place 2 sprays into the nose daily.  . [DISCONTINUED] solifenacin (VESICARE) 5 MG tablet Take 5 mg by mouth  daily.

## 2014-09-08 LAB — HEMOGLOBIN A1C: Hgb A1c MFr Bld: 8.4 % — AB (ref 4.0–6.0)

## 2014-09-09 ENCOUNTER — Other Ambulatory Visit: Payer: Self-pay | Admitting: Pain Medicine

## 2014-09-09 ENCOUNTER — Encounter: Payer: Self-pay | Admitting: Pain Medicine

## 2014-09-09 ENCOUNTER — Ambulatory Visit: Payer: PPO | Attending: Pain Medicine | Admitting: Pain Medicine

## 2014-09-09 VITALS — BP 170/87 | HR 81 | Temp 97.6°F | Resp 18 | Ht 68.0 in | Wt 303.0 lb

## 2014-09-09 DIAGNOSIS — M47816 Spondylosis without myelopathy or radiculopathy, lumbar region: Secondary | ICD-10-CM

## 2014-09-09 DIAGNOSIS — M5136 Other intervertebral disc degeneration, lumbar region: Secondary | ICD-10-CM | POA: Diagnosis not present

## 2014-09-09 DIAGNOSIS — M5126 Other intervertebral disc displacement, lumbar region: Secondary | ICD-10-CM | POA: Diagnosis not present

## 2014-09-09 DIAGNOSIS — M7061 Trochanteric bursitis, right hip: Secondary | ICD-10-CM

## 2014-09-09 DIAGNOSIS — M7062 Trochanteric bursitis, left hip: Secondary | ICD-10-CM

## 2014-09-09 DIAGNOSIS — M79605 Pain in left leg: Secondary | ICD-10-CM | POA: Diagnosis present

## 2014-09-09 DIAGNOSIS — M79604 Pain in right leg: Secondary | ICD-10-CM | POA: Diagnosis present

## 2014-09-09 DIAGNOSIS — M533 Sacrococcygeal disorders, not elsewhere classified: Secondary | ICD-10-CM | POA: Diagnosis not present

## 2014-09-09 DIAGNOSIS — M545 Low back pain: Secondary | ICD-10-CM | POA: Diagnosis present

## 2014-09-09 DIAGNOSIS — M51369 Other intervertebral disc degeneration, lumbar region without mention of lumbar back pain or lower extremity pain: Secondary | ICD-10-CM

## 2014-09-09 MED ORDER — OXYCODONE HCL 5 MG PO CAPS: ORAL_CAPSULE | ORAL | Status: DC

## 2014-09-09 NOTE — Progress Notes (Signed)
   Subjective:    Patient ID: Jacob Herrera, male    DOB: [birth year], 64 y.o.   MRN: [id number]  HPI    Review of Systems     Objective:   Physical Exam        Assessment & Plan:

## 2014-09-09 NOTE — Progress Notes (Signed)
   Subjective:    Patient ID: Jacob Herrera, male    DOB: 17-Sep-1950, 64 y.o.   MRN: [id number]  HPI   patient is a 64 year old gentleman who [facility] and treatment of pain involving the lower back and lower extremity regions. Patient has some improvement of his pain with prior interventional treatment and [facility] at this time will undergo further evaluation consisting of neurosurgical evaluation. We will continue present medications oxycodone and remain available to consider modification of treatment regimen pending follow-up evaluation certification time we will discuss neurosurgical evaluation. Patient without trauma or change in visit dated 11 of significant degree at this time.           Review of Systems     Objective:   Physical Exam   there was tenderness over the splenius capitis muscles andpalpation over the occipitalis muscles reproduce milddiscomfort palpation of the thoracic paraspinal muscles and lumbar paraspinal musculature region reproduces moderate discomfort. Crepitus of the thoracic region noted patient with bilaterally equal grip strength and Tinel's and Phalen's maneuver without increased pain of significant degree. Patient of the PSIS and PII S region reproduced pain of moderate degree. Raising tolerates approximately 20 without a definite increase of pain with dorsiflexion noted mild tenderness of the greater trochanteric region and iliotibial band region noted no definite sensory deficit of dermatomal distribution detected. Abdomen nontender without tenderness to palpation noted is to vertebral angle tenderness noted.        Assessment & Plan:         degenerative disc disease lumbar spine   multilevel involvement L2-3 to L5-S1 , annular disc bulging, facet proceed with hypertrophy, areas of neural foraminal narrowing,   Ligamentum flavum hypertrophy flattening of the lateral recesses.    sacroiliac joint dysfunction sacroiliac joint disease    lumbar facet syndrome

## 2014-09-09 NOTE — Patient Instructions (Addendum)
Continue present medications.  F/U PCP for evaliation of  BP and general medical  condition.  F/U surgical evaluation. [name] asked [name] bleeding you will have your neurosurgical evaluation try to get the appointment date for you leaves today  F/U neurological evaluation.  May consider radiofrequency rhizolysis or intraspinal procedures pending response to present treatment and F/U evaluation.  Patient to call [facility] should patient have concerns prior to scheduled return appointment.

## 2014-09-09 NOTE — Progress Notes (Signed)
0944   Patient discharged to home. Script for oxycodone given. Teachback 3 complete.     Bethann Humble RN

## 2014-09-15 DIAGNOSIS — Z85828 Personal history of other malignant neoplasm of skin: Secondary | ICD-10-CM | POA: Insufficient documentation

## 2014-09-30 ENCOUNTER — Encounter: Payer: Self-pay | Admitting: Family Medicine

## 2014-10-02 ENCOUNTER — Other Ambulatory Visit: Payer: Self-pay | Admitting: Family Medicine

## 2014-10-02 NOTE — Telephone Encounter (Signed)
Pt stated he is out of medication and refills at [facility]. [facility] stated [facility] to contact our office for a refill request. We last sent RX on 02/15/14 with 3 refills and pt stated he had it filled last on 08/24/14. Pt's last OV was 09/08/14. Thanks TNP

## 2014-10-04 ENCOUNTER — Other Ambulatory Visit: Payer: Self-pay | Admitting: Family Medicine

## 2014-10-05 ENCOUNTER — Other Ambulatory Visit: Payer: Self-pay | Admitting: Family Medicine

## 2014-10-05 NOTE — Telephone Encounter (Signed)
Pt is requesting Samples of Toujeo Slolstar 300 unit. If we do nor have samples please send a refill to [place].  [id number]

## 2014-10-06 ENCOUNTER — Telehealth: Payer: Self-pay

## 2014-10-06 MED ORDER — INSULIN GLARGINE 300 UNIT/ML ~~LOC~~ SOPN: 1.0000 | PEN_INJECTOR | Freq: Every day | SUBCUTANEOUS | Status: DC

## 2014-10-06 NOTE — Telephone Encounter (Signed)
Please advise we are out of samples, but have sent rx to pharamacy.

## 2014-10-06 NOTE — Telephone Encounter (Signed)
May fill 3 pens. He may use up to 50 units per day.

## 2014-10-06 NOTE — Telephone Encounter (Signed)
Pharmacist notified.

## 2014-10-06 NOTE — Telephone Encounter (Signed)
Pharmacist Tristen [facility] called stating she received a e rx for [employer]. She needs to know the maximum amount of units the patient will be using daily. Also the rx was sent in for 5 pens, but the pens only come 3 to a box and she is not allowed to take from another box. So she needs the max amount of daily units in order to calculate how many pens the patient will need. Call back is [phone].

## 2014-10-13 ENCOUNTER — Encounter: Payer: PPO | Admitting: Pain Medicine

## 2014-10-21 ENCOUNTER — Telehealth: Payer: Self-pay | Admitting: Family Medicine

## 2014-10-21 MED ORDER — INSULIN GLARGINE 100 UNIT/ML SOLOSTAR PEN: PEN_INJECTOR | SUBCUTANEOUS | Status: DC

## 2014-10-21 NOTE — Telephone Encounter (Signed)
Please advise patient that his insurance does not cover Tujeo, but it does cover Lantus, which is the same thing. Need to know how many units of Tujeo he has been using in order to send in rx for Lantus. Thanks.

## 2014-10-21 NOTE — Telephone Encounter (Signed)
Spoke with patient. He states he has been using 14-18 units of Tujeo daily.

## 2014-10-27 ENCOUNTER — Telehealth: Payer: Self-pay | Admitting: Family Medicine

## 2014-10-27 DIAGNOSIS — E1329 Other specified diabetes mellitus with other diabetic kidney complication: Secondary | ICD-10-CM

## 2014-10-27 MED ORDER — INSULIN PEN NEEDLE 32G X 4 MM MISC: Status: DC

## 2014-10-27 NOTE — Telephone Encounter (Signed)
Needs needle for his insulin.  Pins/  [facility].

## 2014-10-27 NOTE — Telephone Encounter (Signed)
Ok to call in rx.  Thanks.  

## 2014-10-27 NOTE — Telephone Encounter (Signed)
Patient request refill on insulin pen needles. Patient was previously using Novofine 32G . Patient request a generic pen needle be sent into his pharmacy to be sure his insurance will cover it.

## 2014-11-09 ENCOUNTER — Encounter: Payer: Self-pay | Admitting: Family Medicine

## 2014-11-09 ENCOUNTER — Ambulatory Visit (INDEPENDENT_AMBULATORY_CARE_PROVIDER_SITE_OTHER): Payer: PPO | Admitting: Family Medicine

## 2014-11-09 VITALS — BP 152/90 | HR 68 | Temp 97.8°F | Resp 18 | Ht 68.0 in | Wt 313.0 lb

## 2014-11-09 DIAGNOSIS — J984 Other disorders of lung: Secondary | ICD-10-CM

## 2014-11-09 DIAGNOSIS — F329 Major depressive disorder, single episode, unspecified: Secondary | ICD-10-CM | POA: Diagnosis not present

## 2014-11-09 DIAGNOSIS — E669 Obesity, unspecified: Secondary | ICD-10-CM

## 2014-11-09 DIAGNOSIS — N183 Chronic kidney disease, stage 3 unspecified: Secondary | ICD-10-CM

## 2014-11-09 DIAGNOSIS — G4700 Insomnia, unspecified: Secondary | ICD-10-CM | POA: Diagnosis not present

## 2014-11-09 DIAGNOSIS — M545 Low back pain: Secondary | ICD-10-CM

## 2014-11-09 DIAGNOSIS — R0609 Other forms of dyspnea: Secondary | ICD-10-CM | POA: Diagnosis not present

## 2014-11-09 DIAGNOSIS — Z86010 Personal history of colonic polyps: Secondary | ICD-10-CM | POA: Insufficient documentation

## 2014-11-09 DIAGNOSIS — E785 Hyperlipidemia, unspecified: Secondary | ICD-10-CM

## 2014-11-09 DIAGNOSIS — E1165 Type 2 diabetes mellitus with hyperglycemia: Secondary | ICD-10-CM

## 2014-11-09 DIAGNOSIS — E039 Hypothyroidism, unspecified: Secondary | ICD-10-CM

## 2014-11-09 DIAGNOSIS — E1121 Type 2 diabetes mellitus with diabetic nephropathy: Secondary | ICD-10-CM | POA: Diagnosis not present

## 2014-11-09 DIAGNOSIS — F32A Depression, unspecified: Secondary | ICD-10-CM | POA: Insufficient documentation

## 2014-11-09 DIAGNOSIS — R0600 Dyspnea, unspecified: Secondary | ICD-10-CM | POA: Insufficient documentation

## 2014-11-09 DIAGNOSIS — I10 Essential (primary) hypertension: Secondary | ICD-10-CM

## 2014-11-09 DIAGNOSIS — N184 Chronic kidney disease, stage 4 (severe): Secondary | ICD-10-CM | POA: Insufficient documentation

## 2014-11-09 DIAGNOSIS — K219 Gastro-esophageal reflux disease without esophagitis: Secondary | ICD-10-CM | POA: Insufficient documentation

## 2014-11-09 DIAGNOSIS — M47816 Spondylosis without myelopathy or radiculopathy, lumbar region: Secondary | ICD-10-CM

## 2014-11-09 DIAGNOSIS — IMO0002 Reserved for concepts with insufficient information to code with codable children: Secondary | ICD-10-CM

## 2014-11-09 LAB — POCT GLYCOSYLATED HEMOGLOBIN (HGB A1C)
Est. average glucose Bld gHb Est-mCnc: 166
Hemoglobin A1C: 7.4

## 2014-11-09 NOTE — Progress Notes (Signed)
Patient: Jacob Herrera Male    DOB: 06/19/1950   64 y.o.   MRN: [id number] Visit Date: 11/09/2014  Today's Provider: Lelon Huh, MD   Chief Complaint  Patient presents with  . Diabetes    follow up   Subjective:    HPI      Diabetes Mellitus Type II, Follow-up:   Lab Results  Component Value Date   HGBA1C 8.4* 09/08/2014    Last seen for diabetes 2 months ago.  Management changes included  Discontinuing Januvia and starting Toujeo. He reports good compliance with treatment. He is not having side effects.  Current symptoms include polydipsia and have been stable. Home blood sugar records: 120-160 fasting  Episodes of hypoglycemia? Yes (occurs when taking Insulin in the mornings)   Current Insulin Regimen:  Toujeo 14-15 units daily Most Recent Eye Exam: <1 year Weight trend: increasing steadily Prior visit with dietician: no Current diet: in general, an "unhealthy" diet Current exercise: none  Pertinent Labs:    Component Value Date/Time   CHOL 212* 05/20/2014   TRIG 263* 05/20/2014   CREATININE 1.36* 07/08/2013 0555    Wt Readings from Last 3 Encounters:  11/09/14 313 lb (141.976 kg)  09/09/14 303 lb (137.44 kg)  08/12/14 300 lb (136.079 kg)    ------------------------------------------------------------------------  Dyspnea on exertion  States over the last several weeks he has had increasing dyspnea on exertion limiting his activities. No exacerbating factors. Not associated with chest pain or pressure. No change in medications.    Lipid/Cholesterol, Follow-up:   Last seen for this3 months ago.  Management changes since that visit include none. . Last Lipid Panel:    Component Value Date/Time   CHOL 144 11/09/2014 1030   CHOL 212* 05/20/2014   TRIG 109 11/09/2014 1030   HDL 46 11/09/2014 1030   HDL 45 05/20/2014   CHOLHDL 3.1 11/09/2014 1030   LDLCALC 76 11/09/2014 1030   LDLCALC 114 05/20/2014      He reports good  compliance with treatment. He is not having side effects.  Current symptoms include none Current diet: in general, an "unhealthy" diet Current exercise: none  Wt Readings from Last 3 Encounters:  11/09/14 313 lb (141.976 kg)  09/09/14 303 lb (137.44 kg)  08/12/14 300 lb (136.079 kg)    -------------------------------------------------------------------    No Known Allergies Previous Medications   ALBUTEROL (PROVENTIL HFA;VENTOLIN HFA) 108 (90 BASE) MCG/ACT INHALER    Inhale 2 puffs into the lungs every 4 (four) hours as needed for wheezing or shortness of breath.   AMLODIPINE-BENAZEPRIL (LOTREL) 10-40 MG PER CAPSULE    Take 1 capsule by mouth daily.    ASPIRIN 81 MG TABLET    Take 81 mg by mouth daily.   ATORVASTATIN (LIPITOR) 40 MG TABLET    Take 40 mg by mouth daily.   BUDESONIDE-FORMOTEROL (SYMBICORT) 80-4.5 MCG/ACT INHALER    Inhale 2 puffs into the lungs 2 (two) times daily.   CARVEDILOL (COREG) 12.5 MG TABLET    Take 12.5 mg by mouth 2 (two) times daily with a meal.   FLUTICASONE (FLONASE) 50 MCG/ACT NASAL SPRAY    Place 2 sprays into both nostrils daily.   FUROSEMIDE (LASIX) 40 MG TABLET    Take 40 mg by mouth daily.   GLIPIZIDE (GLUCOTROL XL) 10 MG 24 HR TABLET    Take 10 mg by mouth daily with breakfast.   HYDRALAZINE (APRESOLINE) 10 MG TABLET    Take 10 mg  by mouth 2 (two) times daily.   HYDROCODONE-ACETAMINOPHEN (NORCO) 7.5-325 MG PER TABLET    Take 1 tablet by mouth every 6 (six) hours as needed for moderate pain.   INSULIN ASPART (NOVOLOG) 100 UNIT/ML INJECTION    Inject into the skin 3 (three) times daily before meals. Sliding scale based on glucose readings   INSULIN GLARGINE (LANTUS SOLOSTAR) 100 UNIT/ML SOLOSTAR PEN    Inject up to 30 units a day as directed   INSULIN GLARGINE (TOUJEO SOLOSTAR) 300 UNIT/ML SOPN    Inject 14-15 Units into the skin daily.   INSULIN PEN NEEDLE 32G X 4 MM MISC    Use daily as directed   LEVOTHYROXINE (SYNTHROID, LEVOTHROID) 100 MCG  TABLET    Take 100 mcg by mouth daily before breakfast.   LYSINE 500 MG CAPS    Take 1 capsule by mouth daily.   METFORMIN (GLUCOPHAGE) 500 MG TABLET    Take by mouth 2 (two) times daily with a meal.   MULTIPLE VITAMINS-MINERALS ([facility] SPECTRAVITE ADULT 50+ PO)    Take 1 tablet by mouth daily.   NEBIVOLOL (BYSTOLIC) 10 MG TABLET    Take 10 mg by mouth daily.   OMEPRAZOLE (PRILOSEC) 40 MG CAPSULE    Take 1 capsule (40 mg total) by mouth daily.   OXYBUTYNIN (DITROPAN) 5 MG TABLET    Take 5 mg by mouth 3 (three) times daily.    OXYCODONE (OXY-IR) 5 MG CAPSULE    Limit 1/2-1 tab by mouth per day if tolerated   PIOGLITAZONE (ACTOS) 30 MG TABLET    Take 1 tablet (30 mg total) by mouth daily.   SITAGLIPTIN (JANUVIA) 100 MG TABLET    Take 100 mg by mouth daily.   TAMSULOSIN (FLOMAX) 0.4 MG CAPS CAPSULE    Take 0.4 mg by mouth daily.   TRAMADOL (ULTRAM) 50 MG TABLET    Take 50 mg by mouth every 8 (eight) hours as needed.    Review of Systems  Constitutional: Negative for fever, chills and fatigue.  Eyes: Negative for visual disturbance.  Respiratory: Positive for cough (dry cough started 2 weeks ago), shortness of breath and wheezing. Negative for chest tightness.   Cardiovascular: Positive for leg swelling.  Gastrointestinal: Negative for abdominal pain.  Endocrine: Positive for polydipsia.  Genitourinary: Positive for frequency. Negative for dysuria.  Musculoskeletal: Positive for myalgias and arthralgias. Negative for joint swelling, neck pain and neck stiffness.  Neurological: Negative for dizziness, tremors, light-headedness, numbness and headaches.    History  Substance Use Topics  . Smoking status: Former Smoker -- 1.00 packs/day for 25 years    Types: Cigarettes    Quit date: 05/29/1988  . Smokeless tobacco: Current User    Types: Chew     Comment: chews 1 bag of loose leaf chew/week  . Alcohol Use: Yes     Comment: moderate drinking per pt   Objective:   BP 152/90 mmHg  Pulse 68   Temp(Src) 97.8 F (36.6 C) (Oral)  Resp 18  Ht 5\' 8"  (1.727 m)  Wt 313 lb (141.976 kg)  BMI 47.60 kg/m2  SpO2 90%  Physical Exam  Results for orders placed or performed in visit on 11/09/14  POCT HgB A1C  Result Value Ref Range   Hemoglobin A1C 7.4    Est. average glucose Bld gHb Est-mCnc 166        Assessment & Plan:      1. Diabetes type 2, uncontrolled Doing well current medications. Doing  well since starting samples of Tujeo. Will change to Lantus when samples run out due to formulary.  - POCT HgB A1C  - Insulin Glargine (TOUJEO SOLOSTAR) 300 UNIT/ML SOPN; Inject 14-15 Units into the skin daily.  2. Hypothyroidism, unspecified hypothyroidism type Due to check TSG - TSH  3. Type 2 diabetes mellitus with diabetic nephropathy   4. Obesity Counseled to improve diet and increase physical activity.  5. Hyperlipidemia He is tolerating atorvastatin well with no adverse effects.   - Lipid panel   8. Essential hypertension Farily well controlled. Continue current medications.    9. Chronic kidney disease (CKD), stage III (moderate) Stable. Continue routine follow up nephrology.   10. Dyspnea on exertion  - Brain natriuretic peptide - CBC  11. Facet syndrome, lumbar Reasonably well controlled pain. Continue current medications.    12. Restrictive lung disease May be contributing to DOE.      Addressed extensive list of chronic and acute medical problems today requiring extensive time in counseling and coordination care.  Over half of this 45 minute visit were spent in counseling and coordinating care of multiple medical problems.   Lelon Huh, MD  [facility]

## 2014-11-09 NOTE — Patient Instructions (Signed)
   Don't take Novolog Insulin until just before you start your meals.   May change Tujeo to Lantus Insulin using the same number of units when the Tujeo runs out.

## 2014-11-10 DIAGNOSIS — J984 Other disorders of lung: Secondary | ICD-10-CM | POA: Insufficient documentation

## 2014-11-10 LAB — LIPID PANEL
Chol/HDL Ratio: 3.1 ratio units (ref 0.0–5.0)
Cholesterol, Total: 144 mg/dL (ref 100–199)
HDL: 46 mg/dL (ref >39)
LDL Calculated: 76 mg/dL (ref 0–99)
Triglycerides: 109 mg/dL (ref 0–149)
VLDL Cholesterol Cal: 22 mg/dL (ref 5–40)

## 2014-11-10 LAB — CBC
Hematocrit: 38.9 % (ref 37.5–51.0)
Hemoglobin: 12.4 g/dL — ABNORMAL LOW (ref 12.6–17.7)
MCH: 27.3 pg (ref 26.6–33.0)
MCHC: 31.9 g/dL (ref 31.5–35.7)
MCV: 86 fL (ref 79–97)
Platelets: 241 10*3/uL (ref 150–379)
RBC: 4.55 x10E6/uL (ref 4.14–5.80)
RDW: 13.9 % (ref 12.3–15.4)
WBC: 6.6 10*3/uL (ref 3.4–10.8)

## 2014-11-10 LAB — BRAIN NATRIURETIC PEPTIDE: BNP: 154.9 pg/mL — ABNORMAL HIGH (ref 0.0–100.0)

## 2014-11-10 LAB — TSH: TSH: 6.51 u[IU]/mL — ABNORMAL HIGH (ref 0.450–4.500)

## 2014-11-13 ENCOUNTER — Telehealth: Payer: Self-pay | Admitting: *Deleted

## 2014-11-13 ENCOUNTER — Telehealth: Payer: Self-pay | Admitting: Family Medicine

## 2014-11-13 ENCOUNTER — Other Ambulatory Visit: Payer: Self-pay | Admitting: Family Medicine

## 2014-11-13 DIAGNOSIS — R079 Chest pain, unspecified: Secondary | ICD-10-CM

## 2014-11-13 DIAGNOSIS — R0600 Dyspnea, unspecified: Secondary | ICD-10-CM

## 2014-11-13 DIAGNOSIS — R0609 Other forms of dyspnea: Secondary | ICD-10-CM

## 2014-11-13 MED ORDER — LEVOTHYROXINE SODIUM 125 MCG PO TABS: 125.0000 ug | ORAL_TABLET | Freq: Every day | ORAL | Status: DC

## 2014-11-13 NOTE — Progress Notes (Unsigned)
Please refer cardiology for evaluation of chest pain and dyspnea on exertion

## 2014-11-13 NOTE — Telephone Encounter (Signed)
Please advise pt's lab results.

## 2014-11-13 NOTE — Telephone Encounter (Signed)
Patient notified of results. Patient stated that he is still having sob and chest tightness. Patient wants to know if its ok to wait 3 months to come back or should he come in sooner?

## 2014-11-13 NOTE — Telephone Encounter (Signed)
-----   Message from [name], MD sent at 11/13/2014  4:31 PM EDT ----- Thyroid functions are a little off. Need to increase levothyroxine to 125 mcg one tablet daily, #30, rf x 3. Otherwise labs are normal. Follow up in 3 months.

## 2014-11-13 NOTE — Telephone Encounter (Signed)
Pt was in Monday for labs .  He hasnt heard back.  Please call him at [phone].  Thanks, [employer]

## 2014-11-14 ENCOUNTER — Other Ambulatory Visit: Payer: Self-pay | Admitting: Family Medicine

## 2014-11-16 NOTE — Telephone Encounter (Signed)
Please refer to cardiology for chest pain and shortness of breath on exertion. Thanks.

## 2014-11-18 NOTE — Telephone Encounter (Signed)
FYI--Pt refused appointment to see cardiologist.He states he is able to breath better and does not feel he needs referral

## 2014-11-18 NOTE — Telephone Encounter (Signed)
Pt states that he is no longer having shortness of breath and has not had any chest pain.Pt refused referral

## 2014-12-29 ENCOUNTER — Other Ambulatory Visit: Payer: Self-pay | Admitting: Family Medicine

## 2015-01-20 LAB — HM DIABETES EYE EXAM

## 2015-01-27 ENCOUNTER — Encounter: Payer: Self-pay | Admitting: *Deleted

## 2015-02-09 ENCOUNTER — Encounter: Payer: Self-pay | Admitting: Family Medicine

## 2015-02-09 ENCOUNTER — Ambulatory Visit
Admission: RE | Admit: 2015-02-09 | Discharge: 2015-02-09 | Disposition: A | Discharge status: Home / Self Care | Payer: PPO | Source: Ambulatory Visit | Attending: Family Medicine | Admitting: Family Medicine

## 2015-02-09 ENCOUNTER — Other Ambulatory Visit: Payer: Self-pay | Admitting: Family Medicine

## 2015-02-09 ENCOUNTER — Ambulatory Visit (INDEPENDENT_AMBULATORY_CARE_PROVIDER_SITE_OTHER): Payer: PPO | Admitting: Family Medicine

## 2015-02-09 VITALS — BP 124/88 | HR 72 | Temp 98.0°F | Resp 20 | Wt 323.0 lb

## 2015-02-09 DIAGNOSIS — R0609 Other forms of dyspnea: Secondary | ICD-10-CM

## 2015-02-09 DIAGNOSIS — I517 Cardiomegaly: Secondary | ICD-10-CM | POA: Insufficient documentation

## 2015-02-09 DIAGNOSIS — E039 Hypothyroidism, unspecified: Secondary | ICD-10-CM | POA: Diagnosis not present

## 2015-02-09 DIAGNOSIS — R05 Cough: Secondary | ICD-10-CM

## 2015-02-09 DIAGNOSIS — R609 Edema, unspecified: Secondary | ICD-10-CM | POA: Insufficient documentation

## 2015-02-09 DIAGNOSIS — Z23 Encounter for immunization: Secondary | ICD-10-CM

## 2015-02-09 DIAGNOSIS — E1121 Type 2 diabetes mellitus with diabetic nephropathy: Secondary | ICD-10-CM

## 2015-02-09 DIAGNOSIS — R0600 Dyspnea, unspecified: Secondary | ICD-10-CM

## 2015-02-09 DIAGNOSIS — I10 Essential (primary) hypertension: Secondary | ICD-10-CM

## 2015-02-09 DIAGNOSIS — R059 Cough, unspecified: Secondary | ICD-10-CM

## 2015-02-09 LAB — POCT GLYCOSYLATED HEMOGLOBIN (HGB A1C)
Est. average glucose Bld gHb Est-mCnc: 186
Hemoglobin A1C: 8.1

## 2015-02-09 NOTE — Progress Notes (Signed)
Patient: Jacob Herrera Male    DOB: 02-19-1951   64 y.o.   MRN: [id number] Visit Date: 02/09/2015  Today's Provider: Lelon Huh, MD   Chief Complaint  Patient presents with  . Follow-up  . Hypertension  . Hypothyroidism  . Diabetes   Subjective:    HPI   Follow-up for hypothyroidism  Last seen 11/09/2014; increased levothyroxine to 125 mcg qd.  Lab Results  Component Value Date   TSH 6.510* 11/09/2014      Diabetes Mellitus Type II, Follow-up:   Lab Results  Component Value Date   HGBA1C 7.4 11/09/2014   HGBA1C 8.4* 09/08/2014   Last seen for diabetes 3 months ago.  Management since then includes; changed from tujeo to lantus due to formulary. He reports good compliance with treatment. He is not having side effects. none Current symptoms include none and have been resolved. Home blood sugar records: fasting range: 160-180  Episodes of hypoglycemia? no   Current Insulin Regimen: Lantus and Humalog Most Recent Eye Exam: 2 weeks ago Weight trend: fluctuating a bit Prior visit with dietician: no Current diet: in general, an "unhealthy" diet Current exercise: none  ------------------------------------------------------------------------   Hypertension, follow-up:  BP Readings from Last 3 Encounters:  02/09/15 124/88  11/09/14 152/90  09/09/14 170/87    He was last seen for hypertension 3 months ago.  BP at that visit was 152/90. Management since that visit includes; none.He reports good compliance with treatment. He is not having side effects. none  He is not exercising. He is not adherent to low salt diet.   Outside blood pressures are n/a. He is experiencing none.  Patient denies none.   Cardiovascular risk factors include diabetes mellitus.  Use of agents associated with hypertension: none.    . Wt Readings from Last 3 Encounters:  02/09/15 323 lb (146.512 kg)  11/09/14 313 lb (141.976 kg)  09/09/14 303 lb (137.44 kg)     ----------------------------------------------------------------------    Dyspnea on exertion: Has felt increasingly short of breath worsening as he continues to gain weight. He had mildly elevated BNP in July likely affected by CKD. He has also developed a persistent hacking cough over the last several weeks. Has not had chest XR for a few years.     No Known Allergies Previous Medications   ALBUTEROL (PROVENTIL HFA;VENTOLIN HFA) 108 (90 BASE) MCG/ACT INHALER    Inhale 2 puffs into the lungs every 4 (four) hours as needed for wheezing or shortness of breath.   AMLODIPINE (NORVASC) 10 MG TABLET    Take 10 mg by mouth daily.   AMLODIPINE-BENAZEPRIL (LOTREL) 10-40 MG PER CAPSULE    Take 1 capsule by mouth daily.    ASPIRIN 81 MG TABLET    Take 81 mg by mouth daily.   ATORVASTATIN (LIPITOR) 40 MG TABLET    TAKE 1 TABLET BY MOUTH AT BEDTIME   BUDESONIDE-FORMOTEROL (SYMBICORT) 80-4.5 MCG/ACT INHALER    Inhale 2 puffs into the lungs 2 (two) times daily.   CARVEDILOL (COREG) 12.5 MG TABLET    Take 12.5 mg by mouth 2 (two) times daily with a meal.   FLUTICASONE (FLONASE) 50 MCG/ACT NASAL SPRAY    Place 2 sprays into both nostrils daily.   FUROSEMIDE (LASIX) 40 MG TABLET    Take 40 mg by mouth daily.   GLIPIZIDE (GLUCOTROL XL) 10 MG 24 HR TABLET    Take 10 mg by mouth daily with breakfast.   HYDRALAZINE (APRESOLINE)  10 MG TABLET    Take 10 mg by mouth 2 (two) times daily.   HYDROCODONE-ACETAMINOPHEN (NORCO) 7.5-325 MG PER TABLET    Take 1 tablet by mouth every 6 (six) hours as needed for moderate pain.   INSULIN ASPART (NOVOLOG) 100 UNIT/ML INJECTION    Inject into the skin 3 (three) times daily before meals. Sliding scale based on glucose readings   INSULIN GLARGINE (LANTUS SOLOSTAR) 100 UNIT/ML SOLOSTAR PEN    Inject into the skin daily at 10 pm.   INSULIN GLARGINE (TOUJEO SOLOSTAR) 300 UNIT/ML SOPN    Inject 14-15 Units into the skin daily.   INSULIN LISPRO (HUMALOG KWIKPEN) 100 UNIT/ML  KIWKPEN    Inject into the skin.   INSULIN PEN NEEDLE 32G X 4 MM MISC    Use daily as directed   LEVOTHYROXINE (SYNTHROID, LEVOTHROID) 100 MCG TABLET    TAKE 1 TABLET EVERY DAY   LEVOTHYROXINE (SYNTHROID, LEVOTHROID) 125 MCG TABLET    Take 1 tablet (125 mcg total) by mouth daily before breakfast.   LOSARTAN (COZAAR) 100 MG TABLET    Take 100 mg by mouth daily.   LYSINE 500 MG CAPS    Take 1 capsule by mouth daily.   METFORMIN (GLUCOPHAGE) 500 MG TABLET    Take by mouth 2 (two) times daily with a meal.   METFORMIN (GLUCOPHAGE-XR) 500 MG 24 HR TABLET    TAKE 1 (ONE) TABLET BY MOUTH 2 TIMES DAILY   MULTIPLE VITAMINS-MINERALS ([employer] SPECTRAVITE ADULT 50+ PO)    Take 1 tablet by mouth daily.   NEBIVOLOL (BYSTOLIC) 10 MG TABLET    Take 10 mg by mouth daily.   OMEPRAZOLE (PRILOSEC) 40 MG CAPSULE    TAKE 1 CAPSULE BY MOUTH DAILY   OXYBUTYNIN (DITROPAN) 5 MG TABLET    Take 5 mg by mouth 3 (three) times daily.    OXYCODONE (OXY-IR) 5 MG CAPSULE    Limit 1/2-1 tab by mouth per day if tolerated   PIOGLITAZONE (ACTOS) 30 MG TABLET    TAKE 1 TABLET DAILY   SITAGLIPTIN (JANUVIA) 100 MG TABLET    Take 100 mg by mouth daily.   TAMSULOSIN (FLOMAX) 0.4 MG CAPS CAPSULE    Take 0.4 mg by mouth daily.   TRAMADOL (ULTRAM) 50 MG TABLET    Take 50 mg by mouth every 8 (eight) hours as needed.    Review of Systems  Respiratory: Positive for cough, shortness of breath and wheezing.   Cardiovascular: Positive for leg swelling. Negative for chest pain and palpitations.  Musculoskeletal: Positive for back pain.  Neurological: Positive for light-headedness. Negative for dizziness and headaches.    Social History  Substance Use Topics  . Smoking status: Former Smoker -- 1.00 packs/day for 25 years    Types: Cigarettes    Quit date: 05/29/1988  . Smokeless tobacco: Current User    Types: Chew     Comment: chews 1 bag of loose leaf chew/week  . Alcohol Use: Yes     Comment: moderate drinking per pt   Objective:    BP 124/88 mmHg  Pulse 72  Temp(Src) 98 F (36.7 C) (Oral)  Resp 20  Wt 323 lb (146.512 kg)  SpO2 92%  Physical Exam  General Appearance:    Alert, cooperative, no distress, morbidly obese  Eyes:    PERRL, conjunctiva/corneas clear, EOM's intact       Lungs:     Clear to auscultation bilaterally, respirations unlabored  Heart:  Regular rate and rhythm  Neurologic:   Awake, alert, oriented x 3. No apparent focal neurological           defect.   Ext:   2+ bilateral LE edema.       Results for orders placed or performed in visit on 02/09/15  POCT glycosylated hemoglobin (Hb A1C)  Result Value Ref Range   Hemoglobin A1C 8.1    Est. average glucose Bld gHb Est-mCnc 186        Assessment & Plan:     1. Diabetes mellitus with nephropathy (HCC) A1c slightly improved on current insulin regiment, but he is not compliant with diet and continuing to gain weight. Counseled extensively on importance on reducing calories to lose weight. Discussed getting back to [facility] which he declined  - POCT glycosylated hemoglobin (Hb A1C)  2. Essential hypertension Well controlled.  Mostly managed by nephrology. Continue current medications.    3. Hypothyroidism, unspecified hypothyroidism type Tolerating increased dose of levothyroxine.   4. Morbid obesity, unspecified obesity type ([place]) Strongly encouraged he make drastic reduction in calorie count. He decline referral to [facility].   5. Dyspnea on exertion Likely due to deconditioning. Mildly elevated BNP in July.  - DG Chest 2 View; Future  6. Cough  - DG Chest 2 View; Future  7. Need for influenza vaccination  - Flu vaccine HIGH DOSE PF  Return in about 3 months (around 05/12/2015).       Lelon Huh, MD  [facility]

## 2015-02-09 NOTE — Patient Instructions (Signed)
Go to [facility] for chest X-ray.   Reduce portion sizes of meals and avoid all sweets and starchy foods  Walk for exercise at least 20 minutes every day.

## 2015-02-09 NOTE — Progress Notes (Unsigned)
Please schedule echocardiogram for cardiomegaly, dyspnea on exertion.

## 2015-02-12 ENCOUNTER — Ambulatory Visit: Payer: Self-pay | Admitting: Family Medicine

## 2015-02-15 ENCOUNTER — Other Ambulatory Visit: Payer: Self-pay | Admitting: Family Medicine

## 2015-02-15 ENCOUNTER — Ambulatory Visit
Admission: RE | Admit: 2015-02-15 | Discharge: 2015-02-15 | Disposition: A | Discharge status: Home / Self Care | Payer: PPO | Source: Ambulatory Visit | Attending: Family Medicine | Admitting: Family Medicine

## 2015-02-15 DIAGNOSIS — R0609 Other forms of dyspnea: Secondary | ICD-10-CM

## 2015-02-15 DIAGNOSIS — I517 Cardiomegaly: Secondary | ICD-10-CM | POA: Insufficient documentation

## 2015-02-15 DIAGNOSIS — I5032 Chronic diastolic (congestive) heart failure: Secondary | ICD-10-CM

## 2015-02-15 DIAGNOSIS — R931 Abnormal findings on diagnostic imaging of heart and coronary circulation: Secondary | ICD-10-CM | POA: Insufficient documentation

## 2015-02-15 DIAGNOSIS — I5030 Unspecified diastolic (congestive) heart failure: Secondary | ICD-10-CM | POA: Insufficient documentation

## 2015-02-15 DIAGNOSIS — R0600 Dyspnea, unspecified: Secondary | ICD-10-CM | POA: Insufficient documentation

## 2015-02-15 NOTE — Progress Notes (Signed)
*  PRELIMINARY RESULTS* Echocardiogram 2D Echocardiogram has been performed.  Laqueta Jean Hege 02/15/2015, 11:16 AM

## 2015-03-06 ENCOUNTER — Other Ambulatory Visit: Payer: Self-pay | Admitting: Family Medicine

## 2015-03-31 ENCOUNTER — Encounter
Admission: RE | Disposition: A | Discharge status: Home / Self Care | Payer: Self-pay | Source: Ambulatory Visit | Attending: Cardiology

## 2015-03-31 ENCOUNTER — Observation Stay
Admission: RE | Admit: 2015-03-31 | Discharge: 2015-04-02 | Disposition: A | Discharge status: Home / Self Care | Payer: PPO | Source: Ambulatory Visit | Attending: Cardiology | Admitting: Cardiology

## 2015-03-31 ENCOUNTER — Encounter: Payer: Self-pay | Admitting: Cardiology

## 2015-03-31 DIAGNOSIS — I259 Chronic ischemic heart disease, unspecified: Secondary | ICD-10-CM | POA: Diagnosis not present

## 2015-03-31 DIAGNOSIS — I5032 Chronic diastolic (congestive) heart failure: Secondary | ICD-10-CM | POA: Insufficient documentation

## 2015-03-31 DIAGNOSIS — I517 Cardiomegaly: Secondary | ICD-10-CM | POA: Diagnosis not present

## 2015-03-31 DIAGNOSIS — I130 Hypertensive heart and chronic kidney disease with heart failure and stage 1 through stage 4 chronic kidney disease, or unspecified chronic kidney disease: Secondary | ICD-10-CM | POA: Insufficient documentation

## 2015-03-31 DIAGNOSIS — G4733 Obstructive sleep apnea (adult) (pediatric): Secondary | ICD-10-CM | POA: Diagnosis not present

## 2015-03-31 DIAGNOSIS — E782 Mixed hyperlipidemia: Secondary | ICD-10-CM | POA: Insufficient documentation

## 2015-03-31 DIAGNOSIS — R0602 Shortness of breath: Secondary | ICD-10-CM

## 2015-03-31 DIAGNOSIS — M419 Scoliosis, unspecified: Secondary | ICD-10-CM | POA: Insufficient documentation

## 2015-03-31 DIAGNOSIS — R079 Chest pain, unspecified: Secondary | ICD-10-CM | POA: Diagnosis present

## 2015-03-31 DIAGNOSIS — E669 Obesity, unspecified: Secondary | ICD-10-CM | POA: Insufficient documentation

## 2015-03-31 DIAGNOSIS — Z87891 Personal history of nicotine dependence: Secondary | ICD-10-CM | POA: Insufficient documentation

## 2015-03-31 DIAGNOSIS — Z794 Long term (current) use of insulin: Secondary | ICD-10-CM | POA: Diagnosis not present

## 2015-03-31 DIAGNOSIS — Z7982 Long term (current) use of aspirin: Secondary | ICD-10-CM | POA: Insufficient documentation

## 2015-03-31 DIAGNOSIS — J449 Chronic obstructive pulmonary disease, unspecified: Secondary | ICD-10-CM | POA: Diagnosis not present

## 2015-03-31 DIAGNOSIS — Z85828 Personal history of other malignant neoplasm of skin: Secondary | ICD-10-CM | POA: Insufficient documentation

## 2015-03-31 DIAGNOSIS — I25110 Atherosclerotic heart disease of native coronary artery with unstable angina pectoris: Secondary | ICD-10-CM | POA: Diagnosis not present

## 2015-03-31 DIAGNOSIS — R918 Other nonspecific abnormal finding of lung field: Secondary | ICD-10-CM | POA: Diagnosis not present

## 2015-03-31 DIAGNOSIS — Z6841 Body Mass Index (BMI) 40.0 and over, adult: Secondary | ICD-10-CM | POA: Diagnosis not present

## 2015-03-31 DIAGNOSIS — N183 Chronic kidney disease, stage 3 (moderate): Secondary | ICD-10-CM | POA: Diagnosis not present

## 2015-03-31 DIAGNOSIS — I429 Cardiomyopathy, unspecified: Secondary | ICD-10-CM | POA: Diagnosis present

## 2015-03-31 HISTORY — PX: CARDIAC CATHETERIZATION: SHX172

## 2015-03-31 LAB — GLUCOSE, CAPILLARY: Glucose-Capillary: 173 mg/dL — ABNORMAL HIGH (ref 65–99)

## 2015-03-31 SURGERY — LEFT HEART CATH AND CORONARY ANGIOGRAPHY
Anesthesia: Moderate Sedation

## 2015-03-31 MED ORDER — HEPARIN (PORCINE) IN NACL 2-0.9 UNIT/ML-% IJ SOLN
INTRAMUSCULAR | Status: AC
  Filled 2015-03-31: qty 1000

## 2015-03-31 MED ORDER — SODIUM CHLORIDE 0.9 % IJ SOLN
3.0000 mL | Freq: Two times a day (BID) | INTRAMUSCULAR | Status: DC
  Administered 2015-03-31 – 2015-04-02 (×5): 3 mL via INTRAVENOUS

## 2015-03-31 MED ORDER — ACETAMINOPHEN 325 MG PO TABS: 650.0000 mg | ORAL_TABLET | ORAL | Status: DC | PRN

## 2015-03-31 MED ORDER — SODIUM CHLORIDE 0.9 % IJ SOLN: 3.0000 mL | Freq: Two times a day (BID) | INTRAMUSCULAR | Status: DC

## 2015-03-31 MED ORDER — HYDRALAZINE HCL 20 MG/ML IJ SOLN
INTRAMUSCULAR | Status: AC
  Administered 2015-03-31: 10 mg via INTRAVENOUS
  Filled 2015-03-31: qty 1

## 2015-03-31 MED ORDER — SODIUM CHLORIDE 0.9 % IV SOLN
250.0000 mg | INTRAVENOUS | Status: DC | PRN
  Administered 2015-03-31: 1.75 mg/kg/h via INTRAVENOUS

## 2015-03-31 MED ORDER — FENTANYL CITRATE (PF) 100 MCG/2ML IJ SOLN
INTRAMUSCULAR | Status: AC
Start: 2015-03-31 — End: 2015-03-31
  Filled 2015-03-31: qty 2

## 2015-03-31 MED ORDER — MIDAZOLAM HCL 2 MG/2ML IJ SOLN
INTRAMUSCULAR | Status: AC
  Filled 2015-03-31: qty 2

## 2015-03-31 MED ORDER — TICAGRELOR 90 MG PO TABS
ORAL_TABLET | ORAL | Status: AC
Start: 2015-03-31 — End: 2015-03-31
  Filled 2015-03-31: qty 2

## 2015-03-31 MED ORDER — FENTANYL CITRATE (PF) 100 MCG/2ML IJ SOLN
INTRAMUSCULAR | Status: DC | PRN
Start: 2015-03-31 — End: 2015-03-31
  Administered 2015-03-31: 25 ug via INTRAVENOUS

## 2015-03-31 MED ORDER — SODIUM CHLORIDE 0.9 % IJ SOLN: 3.0000 mL | INTRAMUSCULAR | Status: DC | PRN

## 2015-03-31 MED ORDER — TICAGRELOR 90 MG PO TABS
90.0000 mg | ORAL_TABLET | Freq: Two times a day (BID) | ORAL | Status: DC
  Administered 2015-03-31: 90 mg via ORAL
  Filled 2015-03-31: qty 1

## 2015-03-31 MED ORDER — HYDRALAZINE HCL 20 MG/ML IJ SOLN
10.0000 mg | Freq: Four times a day (QID) | INTRAMUSCULAR | Status: DC | PRN
  Administered 2015-03-31 – 2015-04-01 (×2): 10 mg via INTRAVENOUS
  Filled 2015-03-31: qty 1

## 2015-03-31 MED ORDER — ASPIRIN 81 MG PO CHEW
CHEWABLE_TABLET | ORAL | Status: AC
  Filled 2015-03-31: qty 4

## 2015-03-31 MED ORDER — SODIUM CHLORIDE 0.9 % IV SOLN
INTRAVENOUS | Status: DC
  Administered 2015-03-31: 08:00:00 via INTRAVENOUS

## 2015-03-31 MED ORDER — SODIUM CHLORIDE 0.9 % WEIGHT BASED INFUSION: 3.0000 mL/kg/h | INTRAVENOUS | Status: AC

## 2015-03-31 MED ORDER — SODIUM CHLORIDE 0.9 % IV SOLN: 250.0000 mL | INTRAVENOUS | Status: DC | PRN

## 2015-03-31 MED ORDER — BIVALIRUDIN BOLUS VIA INFUSION - CUPID
INTRAVENOUS | Status: DC | PRN
  Administered 2015-03-31: 104.475 mg via INTRAVENOUS
  Administered 2015-03-31: .25 mg via INTRAVENOUS

## 2015-03-31 MED ORDER — ONDANSETRON HCL 4 MG/2ML IJ SOLN: 4.0000 mg | Freq: Four times a day (QID) | INTRAMUSCULAR | Status: DC | PRN

## 2015-03-31 MED ORDER — IOHEXOL 300 MG/ML  SOLN
INTRAMUSCULAR | Status: DC | PRN
  Administered 2015-03-31: 270 mL via INTRA_ARTERIAL

## 2015-03-31 MED ORDER — BIVALIRUDIN 250 MG IV SOLR
INTRAVENOUS | Status: AC
  Filled 2015-03-31: qty 250

## 2015-03-31 MED ORDER — NITROGLYCERIN 5 MG/ML IV SOLN
INTRAVENOUS | Status: AC
  Filled 2015-03-31: qty 10

## 2015-03-31 MED ORDER — OXYCODONE-ACETAMINOPHEN 5-325 MG PO TABS: 1.0000 | ORAL_TABLET | ORAL | Status: DC | PRN

## 2015-03-31 MED ORDER — MIDAZOLAM HCL 2 MG/2ML IJ SOLN
INTRAMUSCULAR | Status: DC | PRN
  Administered 2015-03-31: 0.5 mg via INTRAVENOUS

## 2015-03-31 MED ORDER — ASPIRIN 81 MG PO CHEW
81.0000 mg | CHEWABLE_TABLET | Freq: Every day | ORAL | Status: DC
  Administered 2015-04-01 – 2015-04-02 (×2): 81 mg via ORAL
  Filled 2015-03-31 (×2): qty 1

## 2015-03-31 SURGICAL SUPPLY — 17 items
BALLN TREK RX 3.0X15 (BALLOONS) ×2
BALLOON TREK RX 3.0X15 (BALLOONS) IMPLANT
CATH INFINITI 5FR ANG PIGTAIL (CATHETERS) ×2 IMPLANT
CATH INFINITI 5FR JL4 (CATHETERS) ×2 IMPLANT
CATH INFINITI JR4 5F (CATHETERS) ×2 IMPLANT
CATH VISTA GUIDE 6FR XB3.5 SH (CATHETERS) ×1 IMPLANT
DEVICE CLOSURE MYNXGRIP 6/7F (Vascular Products) ×1 IMPLANT
DEVICE INFLAT 30 PLUS (MISCELLANEOUS) ×1 IMPLANT
KIT MANI 3VAL PERCEP (MISCELLANEOUS) ×2 IMPLANT
NDL PERC 18GX7CM (NEEDLE) ×1 IMPLANT
NEEDLE PERC 18GX7CM (NEEDLE) ×2 IMPLANT
PACK CARDIAC CATH (CUSTOM PROCEDURE TRAY) ×2 IMPLANT
SHEATH AVANTI 5FR X 11CM (SHEATH) ×2 IMPLANT
SHEATH AVANTI 6FR X 11CM (SHEATH) ×1 IMPLANT
STENT XIENCE ALPINE RX 3.0X15 (Permanent Stent) ×1 IMPLANT
WIRE EMERALD 3MM-J .035X150CM (WIRE) ×2 IMPLANT
WIRE G HI TQ BMW 190 (WIRE) ×1 IMPLANT

## 2015-03-31 NOTE — H&P (Signed)
Chief Complaint: Chief Complaint  Patient presents with  . 4 week followup  started on lasix and started dash diet  Date of Service: 03/24/2015 Date of Birth: 1951/04/01 PCP: [name], MD  History of Present Illness: Jacob Herrera is a 64 y.o.male patient who is referred for evaluation of shortness of breath weight gain and after being noted to have decreased LV function on an echocardiogram. In septal hypokinesis. Symptoms continued to be present. Consideration for invasive evaluation would need to be raised. Will need to evaluate his renal function prior to this. He is being followed by Nephrology for is hypertension. He is hemodynamically stable. He complains of exertional shortness of breath and exertional chest tightness. Etiology of this may be related to ischemia given his regional wall motion abnormality on echo. Risk and benefits a left cardiac catheterization were explained the patient and he agrees to proceed.. Most recent renal function this spring showed a serum creatinine of 1.36 with a GFR of greater than 54.  Past Medical and Surgical History  Past Medical History History reviewed. No pertinent past medical history.  Past Surgical History He has no past surgical history on file.   Medications and Allergies  Current Medications  Current Outpatient Prescriptions  Medication Sig Dispense Refill  . aspirin 81 MG EC tablet Take 81 mg by mouth once daily.  [name] Kitchen atorvastatin (LIPITOR) 40 MG tablet Take 40 mg by mouth once daily.  . budesonide-formoterol (SYMBICORT) 160-4.5 mcg/actuation inhaler Inhale 2 inhalations into the lungs 2 (two) times daily.  . carvedilol (COREG) 12.5 MG tablet Take 12.5 mg by mouth 2 (two) times daily with meals.  . FUROsemide (LASIX) 40 MG tablet Take 40 mg by mouth once daily.  [name] Kitchen glipiZIDE (GLUCOTROL) 10 MG XL tablet  . hydrALAZINE (APRESOLINE) 10 MG tablet Take 10 mg by mouth 2 (two) times daily. 3  . HYDROcodone-acetaminophen (NORCO) 7.5-325 mg  tablet Take by mouth.  . insulin glargine 300 unit/mL (1.5 mL) InPn Inject subcutaneously.  . insulin LISPRO (HUMALOG KWIKPEN) 100 unit/mL pen injector Inject subcutaneously 3 (three) times daily before meals.  [name] Kitchen levothyroxine (SYNTHROID, LEVOTHROID) 100 MCG tablet Take 100 mcg by mouth once daily. Take on an empty stomach with a glass of water at least 30-60 minutes before breakfast.  . losartan (COZAAR) 100 MG tablet TAKE 1 TABLET BY MOUTH ONCE A DAY 3  . lysine 500 mg Cap Take by mouth.  . metFORMIN (GLUCOPHAGE) 500 MG tablet Take 500 mg by mouth 2 (two) times daily with meals.  . multivitamin tablet Take 1 tablet by mouth once daily.  . naproxen (NAPROSYN) 500 MG tablet  . omeprazole (PRILOSEC) 40 MG DR capsule Take 40 mg by mouth once daily.  [name] Kitchen oxybutynin (DITROPAN) 5 mg tablet Take 5 mg by mouth 3 (three) times daily.   No current facility-administered medications for this visit.   Allergies: Review of patient's allergies indicates no known allergies.  Social and Family History  Social History reports that he has quit smoking. He uses smokeless tobacco.  Family History History reviewed. No pertinent family history.  Review of Systems  Review of Systems  Constitutional: Negative for chills, diaphoresis, fever, malaise/fatigue and weight loss.  HENT: Negative for congestion, ear discharge, hearing loss and tinnitus.  Eyes: Negative for blurred vision.  Respiratory: Positive for shortness of breath. Negative for cough, hemoptysis, sputum production and wheezing.  Cardiovascular: Positive for leg swelling. Negative for chest pain, palpitations, orthopnea, claudication and PND.  Gastrointestinal: Negative for  abdominal pain, blood in stool, constipation, diarrhea, heartburn, melena, nausea and vomiting.  Genitourinary: Negative for dysuria, frequency, hematuria and urgency.  Musculoskeletal: Negative for back pain, falls, joint pain and myalgias.  Skin: Negative for itching and  rash.  Neurological: Negative for dizziness, tingling, focal weakness, loss of consciousness, weakness and headaches.  Endo/Heme/Allergies: Negative for polydipsia. Does not bruise/bleed easily.  Psychiatric/Behavioral: Negative for depression, memory loss and substance abuse. The patient is not nervous/anxious.   Physical Examination   Vitals: Visit Vitals  . BP (!) 140/92 (BP Location: Left upper arm, Patient Position: Sitting, BP Cuff Size: Large Adult)  . Pulse 80  . Resp 12  . Ht 172.7 cm (5\' 8" )  . Wt (!) 142.4 kg (314 lb)  . BMI 47.74 kg/m2   Ht:172.7 cm (5\' 8" ) Wt:(!) 142.4 kg (314 lb) [id number] surface area is 2.61 meters squared. Body mass index is 47.74 kg/(m^2).  Wt Readings from Last 3 Encounters:  03/24/15 (!) 142.4 kg (314 lb)  02/23/15 (!) 145.2 kg (320 lb)  09/15/14 (!) 136.1 kg (300 lb)   BP Readings from Last 3 Encounters:  03/24/15 (!) 140/92  02/23/15 130/88  09/15/14 168/72   General appearance appears in no acute distress  Head Mouth and Eye exam Normocephalic, without obvious abnormality, atraumatic Dentition is good Eyes appear anicteric   Neck exam Thyroid: normal  Nodes: no obvious adenopathy  LUNGS Breath Sounds: Normal Percussion: Normal  CARDIOVASCULAR JVP CV wave: no HJR: no Elevation at 90 degrees: None Carotid Pulse: normal pulsation bilaterally Bruit: None Apex: apical impulse normal  Auscultation Rhythm: normal sinus rhythm S1: normal S2: normal Clicks: no Rub: no Murmurs: 1/6 medium pitched mid systolic blowing at lower left sternal border  Gallop: None ABDOMEN Liver enlargement: no Pulsatile aorta: no Ascites: no Bruits: no  EXTREMITIES Clubbing: no Edema: 2+ bilateral pedal edema Pulses: peripheral pulses symmetrical Femoral Bruits: no Amputation: no SKIN Rash: no Cyanosis: no Embolic phemonenon: no Bruising: no NEURO Alert and Oriented to person, place and time: yes Non focal: yes  PSYCH: Pt  appears to have normal affect  Diagnostic Studies Reviewed:  EKG EKG demonstrated normal sinus rhythm, nonspecific ST and T waves changes.  CXR Chest X-Ray: pulmonary edema and cardiomegaly.  ECHO Echocardiogram revealed quantitative regurgitation and Normal aortic valve and Mild reduced LV function with septal hypokinesis with EF of 50 percent.  Assessment and Plan   64 y.o. male with  ICD-10-CM ICD-9-CM  1. Chronic diastolic congestive heart failure-ejection fraction mildly reduced. Does have evidence of regional wall motion abnormality by echo report. Will proceed to left cardiac catheterization to evaluate coronary anatomy. Does not appear to be in heart failure at present. He has lost approximately 12 lb since last being seen. Further recommendations after cardiac catheterization is completed I50.32 428.32  428.0  2. Essential hypertension with goal blood pressure less than 130/80-continue with carvedilol from hydrochlorothiazide and follow blood pressure. Low-sodium diet is recommended I10 401.9  3. Mixed hyperlipidemia. Continue with a statin. E78.2 272.2   Return in about 2 weeks (around 04/07/2015).  These notes generated with voice recognition software. I apologize for typographical errors.  Sydnee Levans, MD

## 2015-03-31 NOTE — Progress Notes (Signed)
Pts is s/p stent placement. Pt is SB on telemetry, HR as low as 26 reported by telemetry clerk. Pt also has a 3.7 sec pause. Pt reports feeling light headed. MD notified. No new orders, will continue to monitor.

## 2015-03-31 NOTE — Progress Notes (Signed)
Pt placed on [facility] Cpap unit C-5 with 2L O2 bled in line. Pt tolerating well.

## 2015-04-01 ENCOUNTER — Telehealth: Payer: Self-pay | Admitting: *Deleted

## 2015-04-01 ENCOUNTER — Encounter: Payer: Self-pay | Admitting: Cardiology

## 2015-04-01 DIAGNOSIS — R600 Localized edema: Secondary | ICD-10-CM

## 2015-04-01 DIAGNOSIS — J4540 Moderate persistent asthma, uncomplicated: Secondary | ICD-10-CM | POA: Diagnosis not present

## 2015-04-01 DIAGNOSIS — R0600 Dyspnea, unspecified: Secondary | ICD-10-CM

## 2015-04-01 DIAGNOSIS — I25110 Atherosclerotic heart disease of native coronary artery with unstable angina pectoris: Secondary | ICD-10-CM | POA: Diagnosis not present

## 2015-04-01 LAB — BASIC METABOLIC PANEL
Anion gap: 8 (ref 5–15)
BUN: 21 mg/dL — ABNORMAL HIGH (ref 6–20)
CO2: 30 mmol/L (ref 22–32)
Calcium: 9.3 mg/dL (ref 8.9–10.3)
Chloride: 99 mmol/L — ABNORMAL LOW (ref 101–111)
Creatinine, Ser: 1.38 mg/dL — ABNORMAL HIGH (ref 0.61–1.24)
GFR calc Af Amer: 60 mL/min — ABNORMAL LOW (ref >60)
GFR calc non Af Amer: 52 mL/min — ABNORMAL LOW (ref >60)
Glucose, Bld: 152 mg/dL — ABNORMAL HIGH (ref 65–99)
Potassium: 3.9 mmol/L (ref 3.5–5.1)
Sodium: 137 mmol/L (ref 135–145)

## 2015-04-01 MED ORDER — CLOPIDOGREL BISULFATE 75 MG PO TABS
75.0000 mg | ORAL_TABLET | Freq: Every day | ORAL | Status: DC
  Administered 2015-04-01 – 2015-04-02 (×2): 75 mg via ORAL
  Filled 2015-04-01 (×2): qty 1

## 2015-04-01 MED ORDER — LINAGLIPTIN 5 MG PO TABS
5.0000 mg | ORAL_TABLET | Freq: Every day | ORAL | Status: DC
  Filled 2015-04-01: qty 1

## 2015-04-01 MED ORDER — NAPROXEN 500 MG PO TABS
500.0000 mg | ORAL_TABLET | Freq: Two times a day (BID) | ORAL | Status: DC
  Administered 2015-04-01: 500 mg via ORAL
  Filled 2015-04-01 (×3): qty 1

## 2015-04-01 MED ORDER — FUROSEMIDE 10 MG/ML IJ SOLN
40.0000 mg | Freq: Once | INTRAMUSCULAR | Status: DC
  Administered 2015-04-01: 40 mg via INTRAVENOUS

## 2015-04-01 MED ORDER — LEVALBUTEROL HCL 1.25 MG/0.5ML IN NEBU
1.2500 mg | INHALATION_SOLUTION | Freq: Four times a day (QID) | RESPIRATORY_TRACT | Status: AC
  Administered 2015-04-01: 1.25 mg via RESPIRATORY_TRACT
  Filled 2015-04-01 (×2): qty 0.5

## 2015-04-01 MED ORDER — AMLODIPINE BESYLATE 10 MG PO TABS
10.0000 mg | ORAL_TABLET | Freq: Every day | ORAL | Status: DC
  Administered 2015-04-01: 10 mg via ORAL
  Filled 2015-04-01 (×2): qty 1

## 2015-04-01 MED ORDER — TAMSULOSIN HCL 0.4 MG PO CAPS
0.4000 mg | ORAL_CAPSULE | Freq: Every day | ORAL | Status: DC
  Administered 2015-04-01 – 2015-04-02 (×2): 0.4 mg via ORAL
  Filled 2015-04-01 (×2): qty 1

## 2015-04-01 MED ORDER — HYDRALAZINE HCL 10 MG PO TABS
10.0000 mg | ORAL_TABLET | Freq: Two times a day (BID) | ORAL | Status: DC
  Administered 2015-04-01 – 2015-04-02 (×3): 10 mg via ORAL
  Filled 2015-04-01 (×3): qty 1

## 2015-04-01 MED ORDER — LOSARTAN POTASSIUM 50 MG PO TABS
100.0000 mg | ORAL_TABLET | Freq: Every day | ORAL | Status: DC
  Administered 2015-04-01 – 2015-04-02 (×2): 100 mg via ORAL
  Filled 2015-04-01 (×2): qty 2

## 2015-04-01 MED ORDER — CLOPIDOGREL BISULFATE 75 MG PO TABS: 75.0000 mg | ORAL_TABLET | Freq: Every day | ORAL | Status: DC

## 2015-04-01 MED ORDER — ADULT MULTIVITAMIN W/MINERALS CH
1.0000 | ORAL_TABLET | Freq: Every day | ORAL | Status: DC
  Administered 2015-04-01 – 2015-04-02 (×2): 1 via ORAL
  Filled 2015-04-01 (×2): qty 1

## 2015-04-01 MED ORDER — MORPHINE SULFATE (PF) 2 MG/ML IV SOLN
1.0000 mg | Freq: Once | INTRAVENOUS | Status: DC
  Filled 2015-04-01: qty 1

## 2015-04-01 MED ORDER — OXYBUTYNIN CHLORIDE 5 MG PO TABS
5.0000 mg | ORAL_TABLET | Freq: Three times a day (TID) | ORAL | Status: DC
  Administered 2015-04-01 – 2015-04-02 (×3): 5 mg via ORAL
  Filled 2015-04-01 (×3): qty 1

## 2015-04-01 MED ORDER — PANTOPRAZOLE SODIUM 40 MG PO TBEC
40.0000 mg | DELAYED_RELEASE_TABLET | Freq: Every day | ORAL | Status: DC
  Administered 2015-04-02: 40 mg via ORAL
  Filled 2015-04-01: qty 1

## 2015-04-01 MED ORDER — CARVEDILOL 12.5 MG PO TABS
12.5000 mg | ORAL_TABLET | Freq: Two times a day (BID) | ORAL | Status: DC
  Administered 2015-04-01 – 2015-04-02 (×2): 12.5 mg via ORAL
  Filled 2015-04-01 (×2): qty 1

## 2015-04-01 MED ORDER — GLIPIZIDE 10 MG PO TABS
10.0000 mg | ORAL_TABLET | Freq: Every day | ORAL | Status: DC
  Administered 2015-04-02: 10 mg via ORAL
  Filled 2015-04-01: qty 1

## 2015-04-01 MED ORDER — BUDESONIDE-FORMOTEROL FUMARATE 80-4.5 MCG/ACT IN AERO
2.0000 | INHALATION_SPRAY | Freq: Two times a day (BID) | RESPIRATORY_TRACT | Status: DC
  Administered 2015-04-01 – 2015-04-02 (×2): 2 via RESPIRATORY_TRACT
  Filled 2015-04-01: qty 6.9

## 2015-04-01 MED ORDER — LEVOTHYROXINE SODIUM 125 MCG PO TABS
125.0000 ug | ORAL_TABLET | Freq: Every day | ORAL | Status: DC
  Administered 2015-04-02: 125 ug via ORAL
  Filled 2015-04-01: qty 1

## 2015-04-01 MED ORDER — ATORVASTATIN CALCIUM 20 MG PO TABS
40.0000 mg | ORAL_TABLET | Freq: Every day | ORAL | Status: DC
  Administered 2015-04-01: 40 mg via ORAL
  Filled 2015-04-01: qty 2

## 2015-04-01 MED ORDER — METFORMIN HCL 500 MG PO TABS
500.0000 mg | ORAL_TABLET | Freq: Two times a day (BID) | ORAL | Status: DC
  Administered 2015-04-01 – 2015-04-02 (×2): 500 mg via ORAL
  Filled 2015-04-01 (×2): qty 1

## 2015-04-01 MED ORDER — FLUTICASONE PROPIONATE 50 MCG/ACT NA SUSP
2.0000 | Freq: Every day | NASAL | Status: DC
  Administered 2015-04-01 – 2015-04-02 (×2): 2 via NASAL
  Filled 2015-04-01 (×2): qty 16

## 2015-04-01 MED ORDER — FUROSEMIDE 40 MG PO TABS
40.0000 mg | ORAL_TABLET | Freq: Every day | ORAL | Status: DC
  Administered 2015-04-02: 40 mg via ORAL
  Filled 2015-04-01: qty 1

## 2015-04-01 MED ORDER — FUROSEMIDE 10 MG/ML IJ SOLN
INTRAMUSCULAR | Status: AC
  Filled 2015-04-01: qty 4

## 2015-04-01 NOTE — Discharge Summary (Signed)
Physician Discharge Summary  Patient ID: Jacob Herrera MRN: [id number] DOB/AGE: 66-May-1952 66 y.o.  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)   Discharged Condition: good  Hospital Course: Pt presented as outpatient for cardiac cath due to cardiomyopathy and chest pain. Cath revealed 85% proximal lad lesion. Left circumflex and rca were without signficiant disease. Pt underwent pci of lad. Has done well post pci with no complications.   Consults: None  Significant Diagnostic Studies: labs: met b  Treatments: procedures: pci  Discharge Exam: Blood pressure 163/78, pulse 85, temperature 98.6 F (37 C), temperature source Oral, resp. rate 20, height $RemoveBe'5\' 8"'PtcPSGnwr$  (1.727 m), weight 140.479 kg (309 lb 11.2 oz), SpO2 91 %. General appearance: alert and cooperative Resp: clear to auscultation bilaterally Cardio: regular rate and rhythm GI: soft, non-tender; bowel sounds normal; no masses,  no organomegaly Extremities: extremities normal, atraumatic, no cyanosis or edema Neurologic: Grossly normal  Disposition: Final discharge disposition not confirmed  Discharge Instructions    AMB Referral to Cardiac Rehabilitation - Phase II    Complete by:  As directed   Diagnosis:  PCI            Medication List    TAKE these medications        aspirin 81 MG tablet  Take 81 mg by mouth daily.     atorvastatin 40 MG tablet  Commonly known as:  LIPITOR  TAKE 1 TABLET BY MOUTH AT BEDTIME     budesonide-formoterol 80-4.5 MCG/ACT inhaler  Commonly known as:  SYMBICORT  Inhale 2 puffs into the lungs 2 (two) times daily.     carvedilol 12.5 MG tablet  Commonly known as:  COREG  Take 12.5 mg by mouth 2 (two) times daily with a meal.     clopidogrel 75 MG tablet  Commonly known as:  PLAVIX  Take 1 tablet (75 mg total) by mouth daily.     [employer]S SPECTRAVITE  ADULT 50+ PO  Take 1 tablet by mouth daily.     furosemide 40 MG tablet  Commonly known as:  LASIX  Take 40 mg by mouth daily.     glipiZIDE 10 MG 24 hr tablet  Commonly known as:  GLUCOTROL XL  Take 10 mg by mouth daily with breakfast.     HUMALOG KWIKPEN 100 UNIT/ML KiwkPen  Generic drug:  insulin lispro  Inject into the skin.     hydrALAZINE 10 MG tablet  Commonly known as:  APRESOLINE  Take 10 mg by mouth 2 (two) times daily.     HYDROcodone-acetaminophen 7.5-325 MG tablet  Commonly known as:  NORCO  Take 1 tablet by mouth every 6 (six) hours as needed for moderate pain.     LANTUS SOLOSTAR 100 UNIT/ML Solostar Pen  Generic drug:  Insulin Glargine  Inject into the skin daily at 10 pm.     levothyroxine 125 MCG tablet  Commonly known as:  SYNTHROID, LEVOTHROID  Take 1 tablet (125 mcg total) by mouth daily.     losartan 100 MG tablet  Commonly known as:  COZAAR  Take 100 mg by mouth daily.     Lysine 500 MG Caps  Take 1 capsule by mouth daily.     metFORMIN 500 MG tablet  Commonly known as:  GLUCOPHAGE  Take by mouth 2 (two) times daily with a meal.     naproxen 500 MG tablet  Commonly known as:  NAPROSYN  Take 500 mg by mouth 2 (two) times daily with a meal.     omeprazole 40 MG capsule  Commonly known as:  PRILOSEC  TAKE 1 CAPSULE BY MOUTH DAILY     oxybutynin 5 MG tablet  Commonly known as:  DITROPAN  Take 5 mg by mouth 3 (three) times daily.           Follow-up Information    Follow up with Bartholome Bill A., MD In 1 week.   Specialty:  Cardiology   Why:  Post PCI   Contact information:   [address] [place] [address] [phone]       Signed: Bartholome Bill A. 04/01/2015, 8:19 AM

## 2015-04-01 NOTE — Telephone Encounter (Signed)
-----   Message from Vilinda Boehringer, MD sent at 04/01/2015  3:53 PM EST ----- Regarding: Hospital Follow-up Please schedule follow-up next available provider. Hospital follow-up in 2 weeks.  Formal [facility] patient.  Thanks

## 2015-04-01 NOTE — Care Management (Signed)
Presented for elective cardiac cath which resulted in PCI. Is not discharging home on a cost prohibitive anti coagulant/anti platelet

## 2015-04-01 NOTE — Consult Note (Signed)
PULMONARY / CRITICAL CARE MEDICINE   Name: Jacob Herrera MRN: [id number] DOB: 1950-12-26    ADMISSION DATE:  03/31/2015 CONSULTATION DATE: 03/31/15  REFERRING MD :  Dr. Clayborn Bigness   CHIEF COMPLAINT:     Reason for consult-shortness of breath   HISTORY OF PRESENT ILLNESS  64 y.o.male patient who is referred for evaluation of shortness of breath weight gain and after being noted to have decreased LV function on an echocardiogram with septal hypokinesis. Symptoms continued to be present. He has a history of coronary artery disease, was seen and evaluated by interventional cardiology, Dr. Ubaldo Glassing. Cardiology performed a cardiac catheterization, he had PCI and stent placed approximately. Also noted to have 80% proximal stenosis and a large diagnostic complex lesion. He has a stent in the LAD and is currently on her Lantus and aspirin. Today noted to have shortness of breath when laying flat. He states that he usually has some shortness of breath with recumbent position, but this is a little worse than normal. He has a history of asthma treated with Symbicort. He is not on any chronic oxygen at home, nor does he require it. Pulmonary consult is for dyspnea after cardiac catheterization      SIGNIFICANT EVENTS   12/7>> cardiac catheterization with stent placement in the LAD   PAST MEDICAL HISTORY    :  Past Medical History  Diagnosis Date  . Scoliosis   . Hx of skin cancer, basal cell   . Kidney disease   . Thyrotoxicosis     2010  . History of chicken pox    Past Surgical History  Procedure Laterality Date  . Nasal sinus surgery  2005  . Tonsillectomy and adenoidectomy  1950  . Cardiac catheterization N/A 03/31/2015    Procedure: Left Heart Cath and Coronary Angiography;  Surgeon: Teodoro Spray, MD;  Location: [facility];  Service: Cardiovascular;  Laterality: N/A;  . Cardiac catheterization N/A 03/31/2015    Procedure: Coronary Stent Intervention;  Surgeon: Teodoro Spray, MD;  Location: [facility];  Service: Cardiovascular;  Laterality: N/A;  . Cardiac catheterization N/A 03/31/2015    Procedure: Coronary Stent Intervention;  Surgeon: Yolonda Kida, MD;  Location: [facility];  Service: Cardiovascular;  Laterality: N/A;   Prior to Admission medications   Medication Sig Start Date End Date Taking? Authorizing Provider  aspirin 81 MG tablet Take 81 mg by mouth daily.   Yes Historical Provider, MD  atorvastatin (LIPITOR) 40 MG tablet TAKE 1 TABLET BY MOUTH AT BEDTIME 11/14/14  Yes [name], MD  budesonide-formoterol [facility]) 80-4.5 MCG/ACT inhaler Inhale 2 puffs into the lungs 2 (two) times daily. 02/06/14  Yes Juanito Doom, MD  carvedilol (COREG) 12.5 MG tablet Take 12.5 mg by mouth 2 (two) times daily with a meal.   Yes Historical Provider, MD  furosemide (LASIX) 40 MG tablet Take 40 mg by mouth daily.   Yes Historical Provider, MD  glipiZIDE (GLUCOTROL XL) 10 MG 24 hr tablet Take 10 mg by mouth daily with breakfast.   Yes Historical Provider, MD  hydrALAZINE (APRESOLINE) 10 MG tablet Take 10 mg by mouth 2 (two) times daily.   Yes Historical Provider, MD  HYDROcodone-acetaminophen (NORCO) 7.5-325 MG per tablet Take 1 tablet by mouth every 6 (six) hours as needed for moderate pain.   Yes Historical Provider, MD  Insulin Glargine (LANTUS SOLOSTAR) 100 UNIT/ML Solostar Pen Inject into the skin daily at 10 pm.  Yes Historical Provider, MD  insulin lispro (HUMALOG KWIKPEN) 100 UNIT/ML KiwkPen Inject into the skin.   Yes Historical Provider, MD  levothyroxine (SYNTHROID, LEVOTHROID) 125 MCG tablet Take 1 tablet (125 mcg total) by mouth daily. 03/06/15  Yes [name], MD  losartan (COZAAR) 100 MG tablet Take 100 mg by mouth daily.   Yes Historical Provider, MD  Lysine 500 MG CAPS Take 1 capsule by mouth daily.   Yes Historical Provider, MD  metFORMIN (GLUCOPHAGE) 500 MG tablet Take by mouth 2 (two) times daily with a meal.    Yes Historical Provider, MD  Multiple Vitamins-Minerals ([employer] SPECTRAVITE ADULT 50+ PO) Take 1 tablet by mouth daily.   Yes Historical Provider, MD  naproxen (NAPROSYN) 500 MG tablet Take 500 mg by mouth 2 (two) times daily with a meal.   Yes Historical Provider, MD  omeprazole (PRILOSEC) 40 MG capsule TAKE 1 CAPSULE BY MOUTH DAILY 11/14/14  Yes [name], MD  oxybutynin (DITROPAN) 5 MG tablet Take 5 mg by mouth 3 (three) times daily.    Yes Historical Provider, MD  clopidogrel (PLAVIX) 75 MG tablet Take 1 tablet (75 mg total) by mouth daily. 04/01/15   Teodoro Spray, MD  clopidogrel (PLAVIX) 75 MG tablet Take 1 tablet (75 mg total) by mouth daily. 04/01/15   Teodoro Spray, MD   No Known Allergies   FAMILY HISTORY   Family History  Problem Relation Age of Onset  . Emphysema Father   . Cancer - Lung Father   . Cancer - Ovarian Mother       SOCIAL HISTORY    reports that he quit smoking about 26 years ago. His smoking use included Cigarettes. He has a 25 pack-year smoking history. His smokeless tobacco use includes Chew. He reports that he drinks alcohol. He reports that he does not use illicit drugs.  Review of Systems  Constitutional: Negative for fever, chills, weight loss, malaise/fatigue and diaphoresis.  Eyes: Negative for blurred vision and double vision.  Respiratory: Positive for cough and shortness of breath. Negative for hemoptysis, sputum production and wheezing.   Cardiovascular: Positive for leg swelling. Negative for chest pain and palpitations.  Gastrointestinal: Negative for heartburn, nausea, vomiting and abdominal pain.  Genitourinary: Negative for dysuria.  Musculoskeletal: Negative for myalgias.  Neurological: Negative for dizziness.  Endo/Heme/Allergies: Does not bruise/bleed easily.  Psychiatric/Behavioral: Negative for depression.      VITAL SIGNS    Temp:  [98 F (36.7 C)-98.6 F (37 C)] 98 F (36.7 C) (12/08 1117) Pulse Rate:  [60-101] 64  (12/08 1117) Resp:  [17-22] 20 (12/08 1117) BP: (158-191)/(78-93) 158/80 mmHg (12/08 1117) SpO2:  [91 %-95 %] 94 % (12/08 1117) HEMODYNAMICS:   VENTILATOR SETTINGS:   INTAKE / OUTPUT:  Intake/Output Summary (Last 24 hours) at 04/01/15 1555 Last data filed at 04/01/15 1130  Gross per 24 hour  Intake    960 ml  Output   1600 ml  Net   -640 ml       PHYSICAL EXAM   Physical Exam  Constitutional: He is oriented to person, place, and time. He appears well-developed and well-nourished.  Eyes: Conjunctivae are normal. Pupils are equal, round, and reactive to light.  Neck: Normal range of motion. Neck supple. No tracheal deviation present. No thyromegaly present.  Cardiovascular: Normal rate, regular rhythm, normal heart sounds and intact distal pulses.   Pulmonary/Chest: Effort normal. No respiratory distress. He has no wheezes. He exhibits no tenderness.  Mild  decreased breath sounds at the bilateral bases, very mild fine crackles at the bases.  Abdominal: Soft. Bowel sounds are normal. He exhibits no distension.  Musculoskeletal: Normal range of motion. He exhibits edema.  1-2+ edema to the knee  Neurological: He is alert and oriented to person, place, and time. No cranial nerve deficit. Coordination normal.  Skin: Skin is warm and dry.  Mild erythema of the bilateral lower extremities  Nursing note and vitals reviewed.      LABS   LABS:  CBC No results for input(s): WBC, HGB, HCT, PLT in the last 168 hours. Coag's No results for input(s): APTT, INR in the last 168 hours. BMET  Recent Labs Lab 04/01/15 0906  NA 137  K 3.9  CL 99*  CO2 30  BUN 21*  CREATININE 1.38*  GLUCOSE 152*   Electrolytes  Recent Labs Lab 04/01/15 0906  CALCIUM 9.3   Sepsis Markers No results for input(s): LATICACIDVEN, PROCALCITON, O2SATVEN in the last 168 hours. ABG No results for input(s): PHART, PCO2ART, PO2ART in the last 168 hours. Liver Enzymes No results for input(s):  AST, ALT, ALKPHOS, BILITOT, ALBUMIN in the last 168 hours. Cardiac Enzymes No results for input(s): TROPONINI, PROBNP in the last 168 hours. Glucose  Recent Labs Lab 03/31/15 2207  GLUCAP 173*     No results found for this or any previous visit (from the past 240 hour(s)).   Current facility-administered medications:  .  0.9 %  sodium chloride infusion, 250 mL, Intravenous, PRN, Dwayne D Callwood, MD .  acetaminophen (TYLENOL) tablet 650 mg, 650 mg, Oral, Q4H PRN, Dwayne D Callwood, MD .  amLODipine (NORVASC) tablet 10 mg, 10 mg, Oral, Daily, Dwayne D Callwood, MD .  aspirin chewable tablet 81 mg, 81 mg, Oral, Daily, Dwayne D Callwood, MD, 81 mg at 04/01/15 0939 .  atorvastatin (LIPITOR) tablet 40 mg, 40 mg, Oral, QHS, Dwayne D Callwood, MD .  budesonide-formoterol (SYMBICORT) 80-4.5 MCG/ACT inhaler 2 puff, 2 puff, Inhalation, BID, Dwayne D Callwood, MD .  carvedilol (COREG) tablet 12.5 mg, 12.5 mg, Oral, BID WC, Dwayne D Callwood, MD .  clopidogrel (PLAVIX) tablet 75 mg, 75 mg, Oral, Daily, Teodoro Spray, MD, 75 mg at 04/01/15 0939 .  fluticasone (FLONASE) 50 MCG/ACT nasal spray 2 spray, 2 spray, Each Nare, Daily, Dwayne D Callwood, MD .  furosemide (LASIX) 10 MG/ML injection, , , ,  .  furosemide (LASIX) tablet 40 mg, 40 mg, Oral, Daily, Dwayne D Callwood, MD, 40 mg at 04/01/15 1533 .  [START ON 04/02/2015] glipiZIDE (GLUCOTROL) tablet 10 mg, 10 mg, Oral, QAC breakfast, Dwayne D Callwood, MD .  hydrALAZINE (APRESOLINE) injection 10 mg, 10 mg, Intravenous, Q6H PRN, Vaughan Basta, MD, 10 mg at 04/01/15 0950 .  hydrALAZINE (APRESOLINE) tablet 10 mg, 10 mg, Oral, BID, Dwayne D Callwood, MD .  Derrill Memo ON 04/02/2015] levothyroxine (SYNTHROID, LEVOTHROID) tablet 125 mcg, 125 mcg, Oral, QAC breakfast, Dwayne D Callwood, MD .  linagliptin (TRADJENTA) tablet 5 mg, 5 mg, Oral, Daily, Dwayne D Callwood, MD .  losartan (COZAAR) tablet 100 mg, 100 mg, Oral, Daily, Dwayne D Callwood, MD .   metFORMIN (GLUCOPHAGE) tablet 500 mg, 500 mg, Oral, BID WC, Dwayne D Callwood, MD .  multivitamin with minerals tablet 1 tablet, 1 tablet, Oral, Daily, Dwayne D Callwood, MD .  naproxen (NAPROSYN) tablet 500 mg, 500 mg, Oral, BID WC, Dwayne D Callwood, MD .  ondansetron (ZOFRAN) injection 4 mg, 4 mg, Intravenous, Q6H PRN, Dwayne D  Callwood, MD .  oxybutynin (DITROPAN) tablet 5 mg, 5 mg, Oral, TID, Dwayne D Callwood, MD .  oxyCODONE-acetaminophen (PERCOCET/ROXICET) 5-325 MG per tablet 1-2 tablet, 1-2 tablet, Oral, Q4H PRN, Yolonda Kida, MD .  Derrill Memo ON 04/02/2015] pantoprazole (PROTONIX) EC tablet 40 mg, 40 mg, Oral, QAC breakfast, Dwayne D Callwood, MD .  sodium chloride 0.9 % injection 3 mL, 3 mL, Intravenous, Q12H, Dwayne D Callwood, MD, 3 mL at 04/01/15 1000 .  sodium chloride 0.9 % injection 3 mL, 3 mL, Intravenous, PRN, Dwayne D Callwood, MD .  tamsulosin (FLOMAX) capsule 0.4 mg, 0.4 mg, Oral, Daily, Dwayne D Callwood, MD  IMAGING    No results found.    Indwelling Urinary Catheter continued, requirement due to   Reason to continue Indwelling Urinary Catheter for strict Intake/Output monitoring for hemodynamic instability   Central Line continued, requirement due to   Reason to continue [employer] Monitoring of central venous pressure or other hemodynamic parameters   Ventilator continued, requirement due to, resp failure    Ventilator Sedation RASS 0 to -2   Cultures: BCx2  UC  Sputum  Antibiotics:  Lines:   ASSESSMENT/PLAN  64 year old male past medical history of coronary artery disease status post cardiac catheterization with stent placement in the LAD. Continued shortness of breath.  Dyspnea Chronic asthma Obesity Obstructive sleep apnea Deconditioning -This episode of dyspnea especially in the recumbent position is most likely due to mild fluid overload possibly occurring during his cardiac catheterization. Unfortunately his creatinine is mildly  elevated and would not recommend at this time any further diuresis. I have added levalbuterol schedule nebulizers and advised patient to continue wearing his CPAP at night. -Monitor I's and O's -Morphine 1 mg 1 dose -Chest x-ray -Restrict fluids -Continue with Symbicort 2 puffs twice a day -Pulmonary office will schedule outpatient follow-up with the patient  Thank you for consulting [facility]. Please feel free to contacts [place] with any questions at [phone] (please enter 7-digits).    Pulmonary consult time devoted to this patient-45 minutes  Vilinda Boehringer, MD [facility] Pager [phone] (please enter 7-digits) On Call Pager [phone] (please enter 7-digits)     04/01/2015, 3:55 PM  Note: This note was prepared with Dragon dictation along with smaller phrase technology. Any transcriptional errors that result from this process are unintentional.

## 2015-04-01 NOTE — CV Procedure (Signed)
Date of procedure March 31, 2015  Have talked PCI and stent to the proximal LAD patient referred by Dr. Ubaldo Glassing  patient had 80% proximal and with a large diagonal involved in a complex lesion  sheath was exchanged for 6 [place]  XB guide 3.5 with side holes  BM[employer] wire  anticoagulation with Angiomax  the patient was treated with Brilinta and aspirin  lesion was pre-dilated with  2.5 x 67mm trek  stent was deployed the 3.0 x 51mm  patient maintained TIMI 3 flow pre and postop  patient was transported to telemetry  anticipated discharge within 23 hours  case discussed and reviewed with Dr. Ubaldo Glassing

## 2015-04-01 NOTE — Progress Notes (Signed)
Pt placed on CPAP for sleep with 2L O2 bled inline.

## 2015-04-01 NOTE — Progress Notes (Signed)
Subjective:  Patient complains of dyspnea shortness of breath cough with leg edema PND orthopnea. Denies chest pain no sputum production no fever. Patient states she is too short of breath to go home.  Objective:  Vital Signs in the last 24 hours: Temp:  [98 F (36.7 C)-98.6 F (37 C)] 98 F (36.7 C) (12/08 1117) Pulse Rate:  [60-101] 64 (12/08 1117) Resp:  [17-22] 20 (12/08 1117) BP: (158-191)/(78-93) 158/80 mmHg (12/08 1117) SpO2:  [91 %-95 %] 94 % (12/08 1117)  Intake/Output from previous day: 12/07 0701 - 12/08 0700 In: 720 [P.O.:720] Out: 2250 [Urine:2250] Intake/Output from this shift: Total I/O In: 240 [P.O.:240] Out: 600 [Urine:600]  Physical Exam: General appearance: alert, cooperative and appears stated age Neck: no adenopathy, no carotid bruit, no JVD, supple, symmetrical, trachea midline and thyroid not enlarged, symmetric, no tenderness/mass/nodules Lungs: diminished breath sounds bibasilar and bilaterally, rhonchi bibasilar and wheezes bibasilar Heart: regular rate and rhythm, S1, S2 normal, no murmur, click, rub or gallop Abdomen: soft, non-tender; bowel sounds normal; no masses,  no organomegaly Extremities: edema 2 3+ pitting edema Pulses: 2+ and symmetric Skin: Skin color, texture, turgor normal. No rashes or lesions Neurologic: Alert and oriented X 3, normal strength and tone. Normal symmetric reflexes. Normal coordination and gait  Lab Results: No results for input(s): WBC, HGB, PLT in the last 72 hours.  Recent Labs  04/01/15 0906  NA 137  K 3.9  CL 99*  CO2 30  GLUCOSE 152*  BUN 21*  CREATININE 1.38*   No results for input(s): TROPONINI in the last 72 hours.  Invalid input(s): CK, MB Hepatic Function Panel No results for input(s): PROT, ALBUMIN, AST, ALT, ALKPHOS, BILITOT, BILIDIR, IBILI in the last 72 hours. No results for input(s): CHOL in the last 72 hours. No results for input(s): PROTIME in the last 72 hours.  Imaging: Imaging  results have been reviewed  Cardiac Studies:  Assessment/Plan:  Angina Chest Pain CHF Coronary Artery Disease Edema Shortness of Breath   obstructive sleep apnea  diabetes type 2  chronic renal insufficiency stage III  COPD  hypertension  hyperlipidemia  obesity  PCI and stent DES to the LAD  hyperlipidemia . PLAN  continue telemetry therapy  continue supplemental oxygen therapy  CPAP for obstructive sleep apnea  recommend weight loss exercise portion control  Lasix therapy intravenously for heart failure  continue inhalers for COPD  diabetes continue current therapy  Metformin glipizide insulin  hypertension continue hydralazine losartan Coreg  Amlodipine  continue lipid management with Lipitor therapy  recommend pulmonary console for evaluation and treatment of dyspnea  possible diastolic heart failure continue Lasix therapy Coreg to losartan  agree with Protonix therapy for reflux symptoms  chronic renal insufficiency with monitor BUN and creatinine consider follow-up with Nephrology  continue aspirin and Brilinta for DES stent placement      Sebastien Jackson D. 04/01/2015, 4:29 PM

## 2015-04-01 NOTE — Telephone Encounter (Signed)
Spoke with [facility] in [facility] 2nd floor and gave f/u appt per VM for 04/15/15  @ 9:20am w/ KK. Nothing further needed.

## 2015-04-01 NOTE — Care Management Obs Status (Signed)
[facility] NOTIFICATION   Patient Details  Name: Gumecindo Kehoe MRN: [id number] Date of Birth: June 09, 1950   Medicare Observation Status Notification Given:  Yes    Katrina Stack, RN 04/01/2015, 9:18 AM

## 2015-04-02 ENCOUNTER — Observation Stay: Payer: PPO

## 2015-04-02 DIAGNOSIS — I25110 Atherosclerotic heart disease of native coronary artery with unstable angina pectoris: Secondary | ICD-10-CM | POA: Diagnosis not present

## 2015-04-02 LAB — GLUCOSE, CAPILLARY: Glucose-Capillary: 310 mg/dL — ABNORMAL HIGH (ref 65–99)

## 2015-04-02 MED ORDER — AMLODIPINE BESYLATE 10 MG PO TABS: 10.0000 mg | ORAL_TABLET | Freq: Every day | ORAL | Status: DC

## 2015-04-02 MED ORDER — TAMSULOSIN HCL 0.4 MG PO CAPS: 0.4000 mg | ORAL_CAPSULE | Freq: Every day | ORAL | Status: DC

## 2015-04-02 MED ORDER — OXYBUTYNIN CHLORIDE 5 MG PO TABS: 5.0000 mg | ORAL_TABLET | Freq: Three times a day (TID) | ORAL | Status: DC

## 2015-04-02 MED ORDER — PANTOPRAZOLE SODIUM 40 MG PO TBEC: 40.0000 mg | DELAYED_RELEASE_TABLET | Freq: Every day | ORAL | Status: DC

## 2015-04-02 MED ORDER — FLUTICASONE PROPIONATE 50 MCG/ACT NA SUSP: 2.0000 | Freq: Every day | NASAL | Status: DC

## 2015-04-02 MED ORDER — GLIPIZIDE 10 MG PO TABS: 10.0000 mg | ORAL_TABLET | Freq: Every day | ORAL | Status: DC

## 2015-04-02 MED ORDER — FUROSEMIDE 40 MG PO TABS: 40.0000 mg | ORAL_TABLET | Freq: Every day | ORAL | Status: DC

## 2015-04-02 MED ORDER — NAPROXEN 500 MG PO TABS: 500.0000 mg | ORAL_TABLET | Freq: Two times a day (BID) | ORAL | Status: DC

## 2015-04-02 MED ORDER — LOSARTAN POTASSIUM 100 MG PO TABS: 100.0000 mg | ORAL_TABLET | Freq: Every day | ORAL | Status: AC

## 2015-04-02 MED ORDER — METFORMIN HCL 500 MG PO TABS: 500.0000 mg | ORAL_TABLET | Freq: Two times a day (BID) | ORAL | Status: DC

## 2015-04-02 MED ORDER — ADULT MULTIVITAMIN W/MINERALS CH: 1.0000 | ORAL_TABLET | Freq: Every day | ORAL | Status: DC

## 2015-04-02 NOTE — Discharge Instructions (Signed)
[place] AND 02  [id number]

## 2015-04-02 NOTE — Care Management (Signed)
Patient admitted for elective cardiac cath that resulted in PCI.  Patient's o2 sats were in the low 80s during the procedure and primary nurse has qualified patient for home 02.  Dr Ubaldo Glassing says that the chronic diagnosis of copd.  Patient does have cpap at home.  Lives alone.  Patient is agreeable to home health nurse.  Agency preference is "one that is in my network."  [facility] is in network and referral made for SN and continuous 02.

## 2015-04-02 NOTE — Progress Notes (Signed)
Pt is a&o, VSS, NSR with a 1st degree HB on tele, no complaints of pain or discomfort. O2 assessed on ambulation was 85%. MD and CM notified and home O2 set up. Orders to d/c pt to home. Discharge instructions, post stent booklet and prescription given to pt with verbal acknowledgment of understanding. IV and tele removed and pt to be escorted off unit via wheelchair by volunteer services.

## 2015-04-02 NOTE — Progress Notes (Signed)
SATURATION QUALIFICATIONS: (This note is used to comply with regulatory documentation for home oxygen)  Patient Saturations on Room Air at Rest = 90%  Patient Saturations on Room Air while Ambulating = 85%  Patient Saturations on 2 Liters of oxygen while Ambulating = 93%  Please briefly explain why patient needs home oxygen:

## 2015-04-02 NOTE — Progress Notes (Signed)
Patient ID: Jacob Herrera, male   DOB: 03/11/1951, 64 y.o.   MRN: [id number] DOing well on discharge day other than desaturating his oxygen to below 85 with ambulation with room air. Will need home oxygen. Home Health will also be required.

## 2015-04-13 ENCOUNTER — Encounter: Payer: Self-pay | Admitting: Cardiology

## 2015-04-14 DIAGNOSIS — I5032 Chronic diastolic (congestive) heart failure: Secondary | ICD-10-CM | POA: Diagnosis not present

## 2015-04-14 DIAGNOSIS — I25111 Atherosclerotic heart disease of native coronary artery with angina pectoris with documented spasm: Secondary | ICD-10-CM | POA: Diagnosis not present

## 2015-04-14 DIAGNOSIS — E1122 Type 2 diabetes mellitus with diabetic chronic kidney disease: Secondary | ICD-10-CM | POA: Diagnosis not present

## 2015-04-15 ENCOUNTER — Encounter: Payer: Self-pay | Admitting: Internal Medicine

## 2015-04-15 ENCOUNTER — Ambulatory Visit (INDEPENDENT_AMBULATORY_CARE_PROVIDER_SITE_OTHER): Payer: PPO | Admitting: Internal Medicine

## 2015-04-15 ENCOUNTER — Telehealth: Payer: Self-pay | Admitting: Internal Medicine

## 2015-04-15 VITALS — BP 136/72 | HR 80 | Ht 68.0 in | Wt 310.4 lb

## 2015-04-15 DIAGNOSIS — G4730 Sleep apnea, unspecified: Secondary | ICD-10-CM | POA: Diagnosis not present

## 2015-04-15 DIAGNOSIS — J45909 Unspecified asthma, uncomplicated: Secondary | ICD-10-CM

## 2015-04-15 MED ORDER — ALBUTEROL SULFATE (2.5 MG/3ML) 0.083% IN NEBU: 2.5000 mg | INHALATION_SOLUTION | RESPIRATORY_TRACT | Status: AC

## 2015-04-15 NOTE — Telephone Encounter (Signed)
Hey ladies, I received this message. Do you know why we need to resend this?

## 2015-04-15 NOTE — Progress Notes (Signed)
   Subjective:    Patient ID: Jacob Herrera, male    DOB: 08-20-1950, 64 y.o.   MRN: [id number]  Synopsis: Jacob Herrera first saw the [facility] in February 2015 for evaluation of shortness of breath. He had a past medical history significant for asthma. Herrera function testing was normal with the exception of restriction secondary to obesity. He was started on a combination long acting bronchodilator/inhaled corticosteroid inhaler and he noted significant benefit from this.   CC: follow up SOB  HPI  Patient is here for follow up hospital admission for acute CHF with CAD s/p cath Patient states that his SOB has improved, still with lower ext swelling Patient with persistent wheezing, is on IC/LABA  No signs of infection at this time  Patient is non compliant with biPAP at this time, has been on oxygen since discharge Patient morbidly obese  Patient does NOT smoke at this time   Review of Systems  Constitutional: Positive for fatigue. Negative for fever and chills.  HENT: Positive for postnasal drip, rhinorrhea and sinus pressure.   Respiratory: Negative for chest tightness, shortness of breath and wheezing.   Cardiovascular: Positive for leg swelling. Negative for chest pain and palpitations.  Musculoskeletal: Positive for back pain, joint swelling and arthralgias.       Objective:   Physical Exam Filed Vitals:   04/15/15 0935  BP: 136/72  Pulse: 80  Height: 5\' 8"  (1.727 m)  Weight: 310 lb 6.4 oz (140.797 kg)  SpO2: 93%  RA  Gen: Chronically ill appearing, obese, no acute distress HENT: OP clear, TM's clear, neck supple PULM: CTA B, normal percussion CV: RRR, no mgr,  notable leg edema GI: BS+, soft, nontender Derm: no cyanosis or rash Psyche: normal mood and affect   05/2013 Full FPT > ratio 82%, FEV1 1.95L (67% pred), no change with BD, TLC 3.91 L (64% pred), ERV 0.26L (18% pred), DLCO 15.6 (54% pred)     Assessment & Plan:   64 yo  morbidly bese white male with untreated OSA with cardiomyopathy with s/p stent with Reactive airways disease   Overall, patient with very poor prognosis. Patient with multiorgan dysfunction.   1.will need to re-evaluate sleep assessment with split night study 2.continue oxygen as needed 3.albuterol as needed 4.continue symbicort 5.follow up cardiology 6.follow up nephrology   The Patient requires high complexity decision making for assessment and support, frequent evaluation and titration of therapies. Patient satisfied with Plan of action and management. All questions answered  Corrin Parker, M.D.  [facility]  Medical Director [place] Director [facility] Card[facility] Department

## 2015-04-15 NOTE — Telephone Encounter (Signed)
I spoke with Darlina Guys @[facility]  and she states Ludwig Clarks works at the [employer].  Lenna Sciara is unsure why the Order needs to be resent.  Anyway I have faxed the Order to Attn: Ludwig Clarks [phone].  I have also advised Misty, LPN for Dr. Mortimer Fries in the [facility] office.

## 2015-04-15 NOTE — Telephone Encounter (Signed)
Neb machine order to be resent to [facility] [phone] attention Ludwig Clarks

## 2015-04-15 NOTE — Patient Instructions (Signed)

## 2015-04-27 DIAGNOSIS — L905 Scar conditions and fibrosis of skin: Secondary | ICD-10-CM | POA: Diagnosis not present

## 2015-04-27 DIAGNOSIS — C44519 Basal cell carcinoma of skin of other part of trunk: Secondary | ICD-10-CM | POA: Diagnosis not present

## 2015-04-28 DIAGNOSIS — E782 Mixed hyperlipidemia: Secondary | ICD-10-CM | POA: Diagnosis not present

## 2015-04-28 DIAGNOSIS — Z794 Long term (current) use of insulin: Secondary | ICD-10-CM | POA: Diagnosis not present

## 2015-04-28 DIAGNOSIS — I130 Hypertensive heart and chronic kidney disease with heart failure and stage 1 through stage 4 chronic kidney disease, or unspecified chronic kidney disease: Secondary | ICD-10-CM | POA: Diagnosis not present

## 2015-04-28 DIAGNOSIS — J449 Chronic obstructive pulmonary disease, unspecified: Secondary | ICD-10-CM | POA: Diagnosis not present

## 2015-04-28 DIAGNOSIS — Z7984 Long term (current) use of oral hypoglycemic drugs: Secondary | ICD-10-CM | POA: Diagnosis not present

## 2015-04-28 DIAGNOSIS — Z720 Tobacco use: Secondary | ICD-10-CM | POA: Diagnosis not present

## 2015-04-28 DIAGNOSIS — N183 Chronic kidney disease, stage 3 (moderate): Secondary | ICD-10-CM | POA: Diagnosis not present

## 2015-04-28 DIAGNOSIS — M419 Scoliosis, unspecified: Secondary | ICD-10-CM | POA: Diagnosis not present

## 2015-04-28 DIAGNOSIS — E1122 Type 2 diabetes mellitus with diabetic chronic kidney disease: Secondary | ICD-10-CM | POA: Diagnosis not present

## 2015-04-28 DIAGNOSIS — Z85828 Personal history of other malignant neoplasm of skin: Secondary | ICD-10-CM | POA: Diagnosis not present

## 2015-04-28 DIAGNOSIS — I5032 Chronic diastolic (congestive) heart failure: Secondary | ICD-10-CM | POA: Diagnosis not present

## 2015-04-28 DIAGNOSIS — E669 Obesity, unspecified: Secondary | ICD-10-CM | POA: Diagnosis not present

## 2015-04-28 DIAGNOSIS — I25111 Atherosclerotic heart disease of native coronary artery with angina pectoris with documented spasm: Secondary | ICD-10-CM | POA: Diagnosis not present

## 2015-04-28 DIAGNOSIS — G4733 Obstructive sleep apnea (adult) (pediatric): Secondary | ICD-10-CM | POA: Diagnosis not present

## 2015-05-03 DIAGNOSIS — I5022 Chronic systolic (congestive) heart failure: Secondary | ICD-10-CM | POA: Diagnosis not present

## 2015-05-05 ENCOUNTER — Telehealth: Payer: Self-pay | Admitting: Internal Medicine

## 2015-05-05 DIAGNOSIS — N183 Chronic kidney disease, stage 3 (moderate): Secondary | ICD-10-CM | POA: Diagnosis not present

## 2015-05-05 DIAGNOSIS — E782 Mixed hyperlipidemia: Secondary | ICD-10-CM | POA: Diagnosis not present

## 2015-05-05 DIAGNOSIS — N2581 Secondary hyperparathyroidism of renal origin: Secondary | ICD-10-CM | POA: Diagnosis not present

## 2015-05-05 DIAGNOSIS — E1122 Type 2 diabetes mellitus with diabetic chronic kidney disease: Secondary | ICD-10-CM | POA: Diagnosis not present

## 2015-05-05 DIAGNOSIS — R809 Proteinuria, unspecified: Secondary | ICD-10-CM | POA: Diagnosis not present

## 2015-05-05 DIAGNOSIS — N041 Nephrotic syndrome with focal and segmental glomerular lesions: Secondary | ICD-10-CM | POA: Diagnosis not present

## 2015-05-05 DIAGNOSIS — I25110 Atherosclerotic heart disease of native coronary artery with unstable angina pectoris: Secondary | ICD-10-CM | POA: Diagnosis not present

## 2015-05-05 DIAGNOSIS — I5032 Chronic diastolic (congestive) heart failure: Secondary | ICD-10-CM | POA: Diagnosis not present

## 2015-05-05 DIAGNOSIS — I10 Essential (primary) hypertension: Secondary | ICD-10-CM | POA: Diagnosis not present

## 2015-05-05 NOTE — Telephone Encounter (Signed)
Please advise 

## 2015-05-05 NOTE — Telephone Encounter (Signed)
Sleep Study is scheduled for Thurs 05/06/15- Just FYI. Rhonda J Cobb

## 2015-05-05 NOTE — Telephone Encounter (Signed)
Annette with [facility] stated that per pt he doesn't use the o2 every night just as needed. [name] stated that to be able to get patient's first part of the study done on o2, pt may not reach the requirements to preform the split, which the patient needs.  Please advise if it is ok for sleep med to start the study off o2 and apply o2 as needed? Rhonda J Cobb

## 2015-05-06 ENCOUNTER — Ambulatory Visit: Payer: PPO | Attending: Pulmonary Disease

## 2015-05-06 DIAGNOSIS — G4733 Obstructive sleep apnea (adult) (pediatric): Secondary | ICD-10-CM | POA: Diagnosis not present

## 2015-05-06 NOTE — Telephone Encounter (Signed)
That's ok.

## 2015-05-06 NOTE — Telephone Encounter (Signed)
Per KK it is ok for pt to start sleep study off of O2 and apply O2 as needed.

## 2015-05-06 NOTE — Telephone Encounter (Signed)
Called [facility] and spoke with [name] and gave verbal order per Bruce "that is is ok for pt to start sleep study off of o2 and apply o2 as needed".  [name] voiced understanding and took this down as a verbal order from [place]. Nothing else needed at this time. Jacob Herrera

## 2015-05-07 DIAGNOSIS — Z794 Long term (current) use of insulin: Secondary | ICD-10-CM | POA: Diagnosis not present

## 2015-05-07 DIAGNOSIS — M419 Scoliosis, unspecified: Secondary | ICD-10-CM | POA: Diagnosis not present

## 2015-05-07 DIAGNOSIS — N183 Chronic kidney disease, stage 3 (moderate): Secondary | ICD-10-CM | POA: Diagnosis not present

## 2015-05-07 DIAGNOSIS — Z720 Tobacco use: Secondary | ICD-10-CM | POA: Diagnosis not present

## 2015-05-07 DIAGNOSIS — E669 Obesity, unspecified: Secondary | ICD-10-CM | POA: Diagnosis not present

## 2015-05-07 DIAGNOSIS — J449 Chronic obstructive pulmonary disease, unspecified: Secondary | ICD-10-CM | POA: Diagnosis not present

## 2015-05-07 DIAGNOSIS — E1122 Type 2 diabetes mellitus with diabetic chronic kidney disease: Secondary | ICD-10-CM | POA: Diagnosis not present

## 2015-05-07 DIAGNOSIS — Z7984 Long term (current) use of oral hypoglycemic drugs: Secondary | ICD-10-CM | POA: Diagnosis not present

## 2015-05-07 DIAGNOSIS — E782 Mixed hyperlipidemia: Secondary | ICD-10-CM | POA: Diagnosis not present

## 2015-05-07 DIAGNOSIS — I5032 Chronic diastolic (congestive) heart failure: Secondary | ICD-10-CM | POA: Diagnosis not present

## 2015-05-07 DIAGNOSIS — I25111 Atherosclerotic heart disease of native coronary artery with angina pectoris with documented spasm: Secondary | ICD-10-CM | POA: Diagnosis not present

## 2015-05-07 DIAGNOSIS — Z85828 Personal history of other malignant neoplasm of skin: Secondary | ICD-10-CM | POA: Diagnosis not present

## 2015-05-07 DIAGNOSIS — I130 Hypertensive heart and chronic kidney disease with heart failure and stage 1 through stage 4 chronic kidney disease, or unspecified chronic kidney disease: Secondary | ICD-10-CM | POA: Diagnosis not present

## 2015-05-07 DIAGNOSIS — G4733 Obstructive sleep apnea (adult) (pediatric): Secondary | ICD-10-CM | POA: Diagnosis not present

## 2015-05-08 ENCOUNTER — Other Ambulatory Visit: Payer: Self-pay | Admitting: Family Medicine

## 2015-05-12 ENCOUNTER — Ambulatory Visit (INDEPENDENT_AMBULATORY_CARE_PROVIDER_SITE_OTHER): Payer: PPO | Admitting: Family Medicine

## 2015-05-12 ENCOUNTER — Encounter: Payer: Self-pay | Admitting: Family Medicine

## 2015-05-12 VITALS — BP 140/80 | HR 76 | Temp 98.3°F | Resp 18 | Ht 68.0 in | Wt 307.0 lb

## 2015-05-12 DIAGNOSIS — E1121 Type 2 diabetes mellitus with diabetic nephropathy: Secondary | ICD-10-CM | POA: Diagnosis not present

## 2015-05-12 DIAGNOSIS — I5032 Chronic diastolic (congestive) heart failure: Secondary | ICD-10-CM

## 2015-05-12 DIAGNOSIS — N183 Chronic kidney disease, stage 3 unspecified: Secondary | ICD-10-CM

## 2015-05-12 LAB — POCT GLYCOSYLATED HEMOGLOBIN (HGB A1C)
Est. average glucose Bld gHb Est-mCnc: 220
Hemoglobin A1C: 9.3

## 2015-05-12 MED ORDER — GLUCOSE BLOOD VI STRP: ORAL_STRIP | Status: DC

## 2015-05-12 MED ORDER — INSULIN GLARGINE 100 UNIT/ML SOLOSTAR PEN: PEN_INJECTOR | SUBCUTANEOUS | Status: DC

## 2015-05-12 NOTE — Progress Notes (Signed)
Patient: Jacob Herrera Male    DOB: 02-03-1951   65 y.o.   MRN: [id number] Visit Date: 05/12/2015  Today's Provider: Lelon Huh, MD   Chief Complaint  Patient presents with  . Follow-up  . Diabetes  . Hypertension  . Hypothyroidism   Subjective:    HPI   No Follow-up on file.-up for hypothyroidism from 02/09/2015;  No changes.    Diabetes Mellitus Type II, Follow-up:   Lab Results  Component Value Date   HGBA1C 8.1 02/09/2015   HGBA1C 7.4 11/09/2014   HGBA1C 8.4* 09/08/2014   Last seen for diabetes 3 months ago.  Management since then includes; improve diet and lose weight. Recommended decreasing calorie intake. He reports good compliance with treatment. He is not having side effects. none Current symptoms include none and have been unchanged. Home blood sugar records: fasting range: 245, 300's, 400's He was hospitalized in December for new onset CHF and had DES placed in his LAD by Dr. Ubaldo Glassing. He had follow up with nephrology and was taken off of metformin, after which he states his blood sugar shot up into the 400s. [name] KitchenHe had creatinine of 1.77 when last checked on 05-06-15  He states he has been taking Lantus 28 units in the morning, and 10-12 units at night, but fasting sugars consistently in the 200s and 300s.   ----------------------------------------------------------------------   Hypertension, follow-up:  BP Readings from Last 3 Encounters:  05/12/15 140/80  04/15/15 136/72  04/02/15 145/69    He was last seen for hypertension 3 months ago.  BP at that visit was 124/88. Management since that visit includes; no changes. Mostly managed by Neurology.He reports good compliance with treatment. He is not having side effects. none  He is not exercising. He is not adherent to low salt diet.   Outside blood pressures are 130/80. He is experiencing none.  Patient denies none.   Cardiovascular risk factors include diabetes mellitus.  Use of agents  associated with hypertension: none.   ----------------------------------------------------------------------  Having some constipation, hard stools. Has seen some blood in stool.   No Known Allergies Previous Medications   ALBUTEROL (PROVENTIL) (2.5 MG/3ML) 0.083% NEBULIZER SOLUTION    Take 3 mLs (2.5 mg total) by nebulization every 4 (four) hours. And PRN   AMLODIPINE (NORVASC) 10 MG TABLET    Take 1 tablet (10 mg total) by mouth daily.   ASPIRIN 81 MG TABLET    Take 81 mg by mouth daily.   ATORVASTATIN (LIPITOR) 40 MG TABLET    TAKE 1 TABLET BY MOUTH AT BEDTIME   BUDESONIDE-FORMOTEROL (SYMBICORT) 80-4.5 MCG/ACT INHALER    Inhale 2 puffs into the lungs 2 (two) times daily.   CARVEDILOL (COREG) 12.5 MG TABLET    Take 12.5 mg by mouth 2 (two) times daily with a meal.   CLOPIDOGREL (PLAVIX) 75 MG TABLET    Take 1 tablet (75 mg total) by mouth daily.   ERGOCALCIFEROL (VITAMIN D2) 50000 UNITS CAPSULE    Take 50,000 Units by mouth every 30 (thirty) days.   FLUTICASONE (FLONASE) 50 MCG/ACT NASAL SPRAY    Place 2 sprays into both nostrils daily.   FUROSEMIDE (LASIX) 40 MG TABLET    Take 40 mg by mouth 2 (two) times daily.    GLIPIZIDE (GLUCOTROL) 10 MG TABLET    Take 1 tablet (10 mg total) by mouth daily before breakfast.   HYDRALAZINE (APRESOLINE) 10 MG TABLET    Take 10 mg by mouth  2 (two) times daily.   INSULIN LISPRO (HUMALOG KWIKPEN) 100 UNIT/ML KIWKPEN    Inject into the skin.   LEVOTHYROXINE (SYNTHROID, LEVOTHROID) 125 MCG TABLET    Take 1 tablet (125 mcg total) by mouth daily.   LOSARTAN (COZAAR) 100 MG TABLET    Take 1 tablet (100 mg total) by mouth daily.   LYSINE 500 MG CAPS    Take 1 capsule by mouth daily.   MULTIPLE VITAMINS-MINERALS ([facility] SPECTRAVITE ADULT 50+ PO)    Take 1 tablet by mouth daily.   OMEPRAZOLE (PRILOSEC) 40 MG CAPSULE    TAKE 1 CAPSULE BY MOUTH DAILY   OXYBUTYNIN (DITROPAN) 5 MG TABLET    Take 1 tablet (5 mg total) by mouth 3 (three) times daily.   PANTOPRAZOLE  (PROTONIX) 40 MG TABLET    Take 1 tablet (40 mg total) by mouth daily before breakfast.   SPIRONOLACTONE (ALDACTONE) 25 MG TABLET    Take 25 mg by mouth daily.   TAMSULOSIN (FLOMAX) 0.4 MG CAPS CAPSULE    Take 1 capsule (0.4 mg total) by mouth daily.    Review of Systems  Constitutional: Negative for fever, chills and appetite change.  Respiratory: Positive for chest tightness, shortness of breath and wheezing.   Cardiovascular: Positive for leg swelling. Negative for chest pain and palpitations.  Gastrointestinal: Positive for constipation. Negative for nausea, vomiting and abdominal pain.       Hard stools    Social History  Substance Use Topics  . Smoking status: Former Smoker -- 1.00 packs/day for 25 years    Types: Cigarettes    Quit date: 05/29/1988  . Smokeless tobacco: Current User    Types: Chew     Comment: chews 1 bag of loose leaf chew/week  . Alcohol Use: Yes     Comment: moderate drinking per pt   Objective:   BP 140/80 mmHg  Pulse 76  Temp(Src) 98.3 F (36.8 C) (Oral)  Resp 18  Ht 5\' 8"  (1.727 m)  Wt 307 lb (139.254 kg)  BMI 46.69 kg/m2  SpO2 93%  Physical Exam  General Appearance:    Alert, cooperative, no distress, obese  Eyes:    PERRL, conjunctiva/corneas clear, EOM's intact       Lungs:     Clear to auscultation bilaterally, respirations unlabored  Heart:    Regular rate and rhythm  Neurologic:   Awake, alert, oriented x 3. No apparent focal neurological           defect.           Assessment & Plan:     1. Diabetes mellitus with nephropathy ([place]) Much worse since being taken off of metformin - POCT glycosylated hemoglobin (Hb A1C) Patient Instructions  Start on 40 units of Lantus once a day Increase by 2 units every day until your fasting blood sugars are under 120 Continue taking Humalog before each meal according to sliding scale.   Follow up to review blood sugar readings in 1 month.   2. Chronic kidney disease (CKD), stage III  (moderate) Follow up nephrology in March, as scheduled.   3. Chronic diastolic heart failure (HCC)        Lelon Huh, MD  [facility]

## 2015-05-12 NOTE — Patient Instructions (Addendum)
Start on 40 units of Lantus once a day Increase by 2 units every day until your fasting blood sugars are under 120 Continue taking Humalog before each meal according to sliding scale.

## 2015-05-13 DIAGNOSIS — M419 Scoliosis, unspecified: Secondary | ICD-10-CM | POA: Diagnosis not present

## 2015-05-13 DIAGNOSIS — Z7984 Long term (current) use of oral hypoglycemic drugs: Secondary | ICD-10-CM | POA: Diagnosis not present

## 2015-05-13 DIAGNOSIS — E782 Mixed hyperlipidemia: Secondary | ICD-10-CM | POA: Diagnosis not present

## 2015-05-13 DIAGNOSIS — E669 Obesity, unspecified: Secondary | ICD-10-CM | POA: Diagnosis not present

## 2015-05-13 DIAGNOSIS — J449 Chronic obstructive pulmonary disease, unspecified: Secondary | ICD-10-CM | POA: Diagnosis not present

## 2015-05-13 DIAGNOSIS — I25111 Atherosclerotic heart disease of native coronary artery with angina pectoris with documented spasm: Secondary | ICD-10-CM | POA: Diagnosis not present

## 2015-05-13 DIAGNOSIS — Z794 Long term (current) use of insulin: Secondary | ICD-10-CM | POA: Diagnosis not present

## 2015-05-13 DIAGNOSIS — I5032 Chronic diastolic (congestive) heart failure: Secondary | ICD-10-CM | POA: Diagnosis not present

## 2015-05-13 DIAGNOSIS — Z720 Tobacco use: Secondary | ICD-10-CM | POA: Diagnosis not present

## 2015-05-13 DIAGNOSIS — I130 Hypertensive heart and chronic kidney disease with heart failure and stage 1 through stage 4 chronic kidney disease, or unspecified chronic kidney disease: Secondary | ICD-10-CM | POA: Diagnosis not present

## 2015-05-13 DIAGNOSIS — G4733 Obstructive sleep apnea (adult) (pediatric): Secondary | ICD-10-CM | POA: Diagnosis not present

## 2015-05-13 DIAGNOSIS — E1122 Type 2 diabetes mellitus with diabetic chronic kidney disease: Secondary | ICD-10-CM | POA: Diagnosis not present

## 2015-05-13 DIAGNOSIS — N183 Chronic kidney disease, stage 3 (moderate): Secondary | ICD-10-CM | POA: Diagnosis not present

## 2015-05-13 DIAGNOSIS — Z85828 Personal history of other malignant neoplasm of skin: Secondary | ICD-10-CM | POA: Diagnosis not present

## 2015-05-15 ENCOUNTER — Other Ambulatory Visit: Payer: Self-pay | Admitting: Family Medicine

## 2015-05-17 ENCOUNTER — Ambulatory Visit: Payer: PPO | Admitting: Family Medicine

## 2015-05-17 DIAGNOSIS — I5022 Chronic systolic (congestive) heart failure: Secondary | ICD-10-CM | POA: Diagnosis not present

## 2015-05-17 DIAGNOSIS — I5032 Chronic diastolic (congestive) heart failure: Secondary | ICD-10-CM | POA: Diagnosis not present

## 2015-05-17 DIAGNOSIS — J45909 Unspecified asthma, uncomplicated: Secondary | ICD-10-CM | POA: Diagnosis not present

## 2015-05-19 DIAGNOSIS — G4733 Obstructive sleep apnea (adult) (pediatric): Secondary | ICD-10-CM | POA: Diagnosis not present

## 2015-05-20 ENCOUNTER — Other Ambulatory Visit: Payer: Self-pay | Admitting: Family Medicine

## 2015-05-20 DIAGNOSIS — E1121 Type 2 diabetes mellitus with diabetic nephropathy: Secondary | ICD-10-CM

## 2015-05-21 ENCOUNTER — Telehealth: Payer: Self-pay | Admitting: *Deleted

## 2015-05-21 DIAGNOSIS — G4733 Obstructive sleep apnea (adult) (pediatric): Secondary | ICD-10-CM

## 2015-05-21 MED ORDER — BUDESONIDE-FORMOTEROL FUMARATE 80-4.5 MCG/ACT IN AERO: 2.0000 | INHALATION_SPRAY | Freq: Two times a day (BID) | RESPIRATORY_TRACT | Status: DC

## 2015-05-21 NOTE — Telephone Encounter (Signed)
BiPAP titration ordered. Pt informed. Symbicort sent to pharmacy for pt. Nothing further needed.

## 2015-05-24 DIAGNOSIS — Z720 Tobacco use: Secondary | ICD-10-CM | POA: Diagnosis not present

## 2015-05-24 DIAGNOSIS — Z7984 Long term (current) use of oral hypoglycemic drugs: Secondary | ICD-10-CM | POA: Diagnosis not present

## 2015-05-24 DIAGNOSIS — N183 Chronic kidney disease, stage 3 (moderate): Secondary | ICD-10-CM | POA: Diagnosis not present

## 2015-05-24 DIAGNOSIS — I25111 Atherosclerotic heart disease of native coronary artery with angina pectoris with documented spasm: Secondary | ICD-10-CM | POA: Diagnosis not present

## 2015-05-24 DIAGNOSIS — G4733 Obstructive sleep apnea (adult) (pediatric): Secondary | ICD-10-CM | POA: Diagnosis not present

## 2015-05-24 DIAGNOSIS — E1122 Type 2 diabetes mellitus with diabetic chronic kidney disease: Secondary | ICD-10-CM | POA: Diagnosis not present

## 2015-05-24 DIAGNOSIS — J449 Chronic obstructive pulmonary disease, unspecified: Secondary | ICD-10-CM | POA: Diagnosis not present

## 2015-05-24 DIAGNOSIS — E782 Mixed hyperlipidemia: Secondary | ICD-10-CM | POA: Diagnosis not present

## 2015-05-24 DIAGNOSIS — I5032 Chronic diastolic (congestive) heart failure: Secondary | ICD-10-CM | POA: Diagnosis not present

## 2015-05-24 DIAGNOSIS — Z85828 Personal history of other malignant neoplasm of skin: Secondary | ICD-10-CM | POA: Diagnosis not present

## 2015-05-24 DIAGNOSIS — I130 Hypertensive heart and chronic kidney disease with heart failure and stage 1 through stage 4 chronic kidney disease, or unspecified chronic kidney disease: Secondary | ICD-10-CM | POA: Diagnosis not present

## 2015-05-24 DIAGNOSIS — E669 Obesity, unspecified: Secondary | ICD-10-CM | POA: Diagnosis not present

## 2015-05-24 DIAGNOSIS — Z794 Long term (current) use of insulin: Secondary | ICD-10-CM | POA: Diagnosis not present

## 2015-05-24 DIAGNOSIS — M419 Scoliosis, unspecified: Secondary | ICD-10-CM | POA: Diagnosis not present

## 2015-06-01 ENCOUNTER — Telehealth: Payer: Self-pay | Admitting: *Deleted

## 2015-06-01 DIAGNOSIS — G4733 Obstructive sleep apnea (adult) (pediatric): Secondary | ICD-10-CM

## 2015-06-01 NOTE — Telephone Encounter (Signed)
Order placed for Auto bipap

## 2015-06-02 ENCOUNTER — Other Ambulatory Visit: Payer: Self-pay | Admitting: *Deleted

## 2015-06-02 MED ORDER — GLIPIZIDE 10 MG PO TABS: 10.0000 mg | ORAL_TABLET | Freq: Every day | ORAL | Status: DC

## 2015-06-02 NOTE — Telephone Encounter (Signed)
Requesting 90 day supply, [facility].

## 2015-06-03 DIAGNOSIS — I5022 Chronic systolic (congestive) heart failure: Secondary | ICD-10-CM | POA: Diagnosis not present

## 2015-06-07 DIAGNOSIS — I5032 Chronic diastolic (congestive) heart failure: Secondary | ICD-10-CM | POA: Diagnosis not present

## 2015-06-07 DIAGNOSIS — I5022 Chronic systolic (congestive) heart failure: Secondary | ICD-10-CM | POA: Diagnosis not present

## 2015-06-07 DIAGNOSIS — G4733 Obstructive sleep apnea (adult) (pediatric): Secondary | ICD-10-CM | POA: Diagnosis not present

## 2015-06-07 DIAGNOSIS — J45909 Unspecified asthma, uncomplicated: Secondary | ICD-10-CM | POA: Diagnosis not present

## 2015-06-11 ENCOUNTER — Telehealth: Payer: Self-pay | Admitting: Family Medicine

## 2015-06-11 ENCOUNTER — Ambulatory Visit (INDEPENDENT_AMBULATORY_CARE_PROVIDER_SITE_OTHER): Payer: PPO | Admitting: Family Medicine

## 2015-06-11 ENCOUNTER — Encounter: Payer: Self-pay | Admitting: Family Medicine

## 2015-06-11 VITALS — BP 124/78 | HR 74 | Temp 98.2°F | Resp 18 | Ht 68.0 in | Wt 313.0 lb

## 2015-06-11 DIAGNOSIS — E1121 Type 2 diabetes mellitus with diabetic nephropathy: Secondary | ICD-10-CM | POA: Diagnosis not present

## 2015-06-11 DIAGNOSIS — G4733 Obstructive sleep apnea (adult) (pediatric): Secondary | ICD-10-CM | POA: Diagnosis not present

## 2015-06-11 LAB — POCT CBG (FASTING - GLUCOSE)-MANUAL ENTRY: Glucose Fasting, POC: 192 mg/dL — AB (ref 70–99)

## 2015-06-11 NOTE — Patient Instructions (Signed)
   Check with insurance coverage for Trulicity, Victoza, or Bydureon    You need to get at least 150 minutes of exercise every week

## 2015-06-11 NOTE — Telephone Encounter (Signed)
Pt calling back regarding his medications and whether insurance is going to pay or not.  He had some information to give but wasn't very clear.  Please call him back [phone]  [facility]

## 2015-06-11 NOTE — Telephone Encounter (Signed)
Patient was calling to let Dr. Caryn Section know that his insurance will cover Victoza with a $45 co-pay. Also patient is requesting that his rx for test strips be rewritten for 2-3 x qd.

## 2015-06-11 NOTE — Progress Notes (Signed)
Patient: Jacob Herrera Male    DOB: Jan 30, 1951   65 y.o.   MRN: [id number] Visit Date: 06/11/2015  Today's Provider: Lelon Huh, MD   Chief Complaint  Patient presents with  . Follow-up  . Diabetes   Subjective:    HPI   Diabetes Mellitus Type II, Follow-up:   Lab Results  Component Value Date   HGBA1C 9.3 05/12/2015   HGBA1C 8.1 02/09/2015   HGBA1C 7.4 11/09/2014    Last seen for diabetes 1 months ago.  Management since then includes; started on 40 units of Lantus qd. Increase by 2 units qd until fasting blood sugars are under 120. Continue taking Humalog before each meal according to sliding scale. He reports good compliance with treatment. He is having side effects. Numbness in left foot Current symptoms include none and have been unchanged. Home blood sugar records: fasting range: 229-374   He was taken off Actos due to CHF and metformin due to CKD last year and sugars have been much worse since. He is very inactive due to fatigue and back pain  Episodes of hypoglycemia? no   Current Insulin Regimen: 98 units of Lantus qd Most Recent Eye Exam: 03/2015 Weight trend: stable Prior visit with dietician: no Current diet: well balanced Current exercise: none  Pertinent Labs:    Component Value Date/Time   CHOL 144 11/09/2014 1030   CHOL 212* 05/20/2014   TRIG 109 11/09/2014 1030   HDL 46 11/09/2014 1030   HDL 45 05/20/2014   LDLCALC 76 11/09/2014 1030   LDLCALC 114 05/20/2014   CREATININE 1.38* 04/01/2015 0906   CREATININE 1.36* 07/08/2013 0555    Wt Readings from Last 3 Encounters:  06/11/15 313 lb (141.976 kg)  05/12/15 307 lb (139.254 kg)  04/15/15 310 lb 6.4 oz (140.797 kg)    ----------------------------------------------------------------------    No Known Allergies Previous Medications   ALBUTEROL (PROVENTIL) (2.5 MG/3ML) 0.083% NEBULIZER SOLUTION    Take 3 mLs (2.5 mg total) by nebulization every 4 (four) hours. And PRN   AMLODIPINE (NORVASC) 10 MG TABLET    Take 1 tablet (10 mg total) by mouth daily.   ASPIRIN 81 MG TABLET    Take 81 mg by mouth daily.   ATORVASTATIN (LIPITOR) 40 MG TABLET    TAKE 1 TABLET BY MOUTH AT BEDTIME   BUDESONIDE-FORMOTEROL (SYMBICORT) 80-4.5 MCG/ACT INHALER    Inhale 2 puffs into the lungs 2 (two) times daily.   CARVEDILOL (COREG) 12.5 MG TABLET    Take 12.5 mg by mouth 2 (two) times daily with a meal.   CLOPIDOGREL (PLAVIX) 75 MG TABLET    Take 1 tablet (75 mg total) by mouth daily.   ERGOCALCIFEROL (VITAMIN D2) 50000 UNITS CAPSULE    Take 50,000 Units by mouth every 30 (thirty) days.   FLUTICASONE (FLONASE) 50 MCG/ACT NASAL SPRAY    Place 2 sprays into both nostrils daily.   FUROSEMIDE (LASIX) 40 MG TABLET    Take 40 mg by mouth 2 (two) times daily.    GLIPIZIDE (GLUCOTROL) 10 MG TABLET    Take 1 tablet (10 mg total) by mouth daily before breakfast.   GLUCOSE BLOOD (ONE TOUCH ULTRA TEST) TEST STRIP    USE ONE STRIP ONCE DAILY   HUMALOG KWIKPEN 100 UNIT/ML KIWKPEN    INJECT UP TO 12 UNITS UNITS SUBCUTANEOUS FOUR TIMES DAILY BEFORE MEALS   HYDRALAZINE (APRESOLINE) 10 MG TABLET    Take 10 mg by mouth 2 (  two) times daily.   INSULIN GLARGINE (LANTUS SOLOSTAR) 100 UNIT/ML SOLOSTAR PEN    Titrate up to 80 units, once a day   LEVOTHYROXINE (SYNTHROID, LEVOTHROID) 125 MCG TABLET    Take 1 tablet (125 mcg total) by mouth daily.   LOSARTAN (COZAAR) 100 MG TABLET    Take 1 tablet (100 mg total) by mouth daily.   LYSINE 500 MG CAPS    Take 1 capsule by mouth daily.   MULTIPLE VITAMINS-MINERALS ([facility] SPECTRAVITE ADULT 50+ PO)    Take 1 tablet by mouth daily.   OMEPRAZOLE (PRILOSEC) 40 MG CAPSULE    TAKE 1 CAPSULE BY MOUTH DAILY   OXYBUTYNIN (DITROPAN) 5 MG TABLET    Take 1 tablet (5 mg total) by mouth 3 (three) times daily.   PANTOPRAZOLE (PROTONIX) 40 MG TABLET    Take 1 tablet (40 mg total) by mouth daily before breakfast.   SPIRONOLACTONE (ALDACTONE) 25 MG TABLET    Take 25 mg by mouth daily.    TAMSULOSIN (FLOMAX) 0.4 MG CAPS CAPSULE    Take 1 capsule (0.4 mg total) by mouth daily.    Review of Systems  Constitutional: Negative for fever, chills and appetite change.  Respiratory: Negative for chest tightness, shortness of breath and wheezing.   Cardiovascular: Negative for chest pain and palpitations.  Gastrointestinal: Negative for nausea, vomiting and abdominal pain.       Gas in upper right side of abdomain    Social History  Substance Use Topics  . Smoking status: Former Smoker -- 1.00 packs/day for 25 years    Types: Cigarettes    Quit date: 05/29/1988  . Smokeless tobacco: Current User    Types: Chew     Comment: chews 1 bag of loose leaf chew/week  . Alcohol Use: Yes     Comment: moderate drinking per pt   Objective:   BP 124/78 mmHg  Pulse 74  Temp(Src) 98.2 F (36.8 C) (Oral)  Resp 18  Ht 5\' 8"  (1.727 m)  Wt 313 lb (141.976 kg)  BMI 47.60 kg/m2  SpO2 94%  Physical Exam  General Appearance:    Alert, cooperative, no distress,  Morbidly obese  Eyes:    PERRL, conjunctiva/corneas clear, EOM's intact       Lungs:     Clear to auscultation bilaterally, respirations unlabored  Heart:    Regular rate and rhythm  Neurologic:   Awake, alert, oriented x 3. No apparent focal neurological           defect.       Results for orders placed or performed in visit on 06/11/15  POCT CBG (Fasting - Glucose)  Result Value Ref Range   Glucose Fasting, POC 192 (A) 70 - 99 mg/dL        Assessment & Plan:     1. Diabetes mellitus with nephropathy ([place]) Much worse since having to stop Actos and metformin. Poor compliance with diet and exercise. Extensively counseled on importance of getting more physical activity and losing weight. He is interested in trying Trulicity or similar medication. He is going to check with his insurance regarding coverage. Also discussed option of starting back on low dose of metformin, however, would like have more significantly improvement  with GLP-1 agonist.   - POCT CBG (Fasting - Glucose)  Over half of this 30 minute visit was spent in counseling and coordinating care of medical problems.   2. OSA Recently started Bipap which he states he is tolerating much  better.       Lelon Huh, MD  [facility]

## 2015-06-14 MED ORDER — GLUCOSE BLOOD VI STRP: ORAL_STRIP | Status: DC

## 2015-06-14 MED ORDER — LIRAGLUTIDE 18 MG/3ML ~~LOC~~ SOPN: 1.8000 mg | PEN_INJECTOR | Freq: Every day | SUBCUTANEOUS | Status: DC

## 2015-06-14 MED ORDER — INSULIN GLARGINE 100 UNIT/ML SOLOSTAR PEN: PEN_INJECTOR | SUBCUTANEOUS | Status: DC

## 2015-06-14 NOTE — Telephone Encounter (Signed)
Pt has called back .  Please call him back at [phone]  [facility]

## 2015-06-14 NOTE — Telephone Encounter (Signed)
OK, he can try sample of Victoza before getting prescription filled to make sure he tolerates it. Can have one sample, start with 0.6mg  daily for 7 days, then 1.2mg  daily for 7 days, then 1.8mg  daily. Have printed prescriptions which he can fill once sample is gone. Follow up o.v. 8 weeks.

## 2015-06-14 NOTE — Telephone Encounter (Signed)
Patient was notified.

## 2015-06-14 NOTE — Telephone Encounter (Signed)
Patient is also requesting that his rx for test strips be rewritten for 2-3 x qd. Patient stated that pharmacy is not giving him enough to last a month.

## 2015-06-17 DIAGNOSIS — I5022 Chronic systolic (congestive) heart failure: Secondary | ICD-10-CM | POA: Diagnosis not present

## 2015-06-17 DIAGNOSIS — J45909 Unspecified asthma, uncomplicated: Secondary | ICD-10-CM | POA: Diagnosis not present

## 2015-06-17 DIAGNOSIS — I5032 Chronic diastolic (congestive) heart failure: Secondary | ICD-10-CM | POA: Diagnosis not present

## 2015-06-18 ENCOUNTER — Ambulatory Visit: Payer: PPO | Admitting: Family Medicine

## 2015-06-24 DIAGNOSIS — I25110 Atherosclerotic heart disease of native coronary artery with unstable angina pectoris: Secondary | ICD-10-CM | POA: Diagnosis not present

## 2015-06-24 DIAGNOSIS — M542 Cervicalgia: Secondary | ICD-10-CM | POA: Diagnosis not present

## 2015-06-24 DIAGNOSIS — E782 Mixed hyperlipidemia: Secondary | ICD-10-CM | POA: Diagnosis not present

## 2015-06-24 DIAGNOSIS — I10 Essential (primary) hypertension: Secondary | ICD-10-CM | POA: Diagnosis not present

## 2015-06-24 DIAGNOSIS — I5032 Chronic diastolic (congestive) heart failure: Secondary | ICD-10-CM | POA: Diagnosis not present

## 2015-07-01 DIAGNOSIS — M542 Cervicalgia: Secondary | ICD-10-CM | POA: Diagnosis not present

## 2015-07-01 DIAGNOSIS — M5412 Radiculopathy, cervical region: Secondary | ICD-10-CM | POA: Diagnosis not present

## 2015-07-01 DIAGNOSIS — I5022 Chronic systolic (congestive) heart failure: Secondary | ICD-10-CM | POA: Diagnosis not present

## 2015-07-01 DIAGNOSIS — M25511 Pain in right shoulder: Secondary | ICD-10-CM | POA: Diagnosis not present

## 2015-07-02 ENCOUNTER — Other Ambulatory Visit: Payer: Self-pay | Admitting: Family Medicine

## 2015-07-05 DIAGNOSIS — G4733 Obstructive sleep apnea (adult) (pediatric): Secondary | ICD-10-CM | POA: Diagnosis not present

## 2015-07-05 DIAGNOSIS — I5022 Chronic systolic (congestive) heart failure: Secondary | ICD-10-CM | POA: Diagnosis not present

## 2015-07-05 DIAGNOSIS — J45909 Unspecified asthma, uncomplicated: Secondary | ICD-10-CM | POA: Diagnosis not present

## 2015-07-05 DIAGNOSIS — I5032 Chronic diastolic (congestive) heart failure: Secondary | ICD-10-CM | POA: Diagnosis not present

## 2015-07-15 DIAGNOSIS — J45909 Unspecified asthma, uncomplicated: Secondary | ICD-10-CM | POA: Diagnosis not present

## 2015-07-15 DIAGNOSIS — I5022 Chronic systolic (congestive) heart failure: Secondary | ICD-10-CM | POA: Diagnosis not present

## 2015-07-15 DIAGNOSIS — I5032 Chronic diastolic (congestive) heart failure: Secondary | ICD-10-CM | POA: Diagnosis not present

## 2015-07-16 DIAGNOSIS — N183 Chronic kidney disease, stage 3 (moderate): Secondary | ICD-10-CM | POA: Diagnosis not present

## 2015-07-16 DIAGNOSIS — R809 Proteinuria, unspecified: Secondary | ICD-10-CM | POA: Diagnosis not present

## 2015-07-16 DIAGNOSIS — I129 Hypertensive chronic kidney disease with stage 1 through stage 4 chronic kidney disease, or unspecified chronic kidney disease: Secondary | ICD-10-CM | POA: Diagnosis not present

## 2015-07-16 DIAGNOSIS — E1122 Type 2 diabetes mellitus with diabetic chronic kidney disease: Secondary | ICD-10-CM | POA: Diagnosis not present

## 2015-07-16 DIAGNOSIS — N2581 Secondary hyperparathyroidism of renal origin: Secondary | ICD-10-CM | POA: Diagnosis not present

## 2015-07-22 ENCOUNTER — Encounter: Payer: Self-pay | Admitting: Internal Medicine

## 2015-07-23 ENCOUNTER — Other Ambulatory Visit: Payer: Self-pay | Admitting: Family Medicine

## 2015-07-23 MED ORDER — LIRAGLUTIDE 18 MG/3ML ~~LOC~~ SOPN: PEN_INJECTOR | SUBCUTANEOUS | Status: DC

## 2015-07-26 DIAGNOSIS — Z85820 Personal history of malignant melanoma of skin: Secondary | ICD-10-CM | POA: Diagnosis not present

## 2015-07-26 DIAGNOSIS — H0015 Chalazion left lower eyelid: Secondary | ICD-10-CM | POA: Diagnosis not present

## 2015-07-26 DIAGNOSIS — L0232 Furuncle of buttock: Secondary | ICD-10-CM | POA: Diagnosis not present

## 2015-07-26 DIAGNOSIS — L905 Scar conditions and fibrosis of skin: Secondary | ICD-10-CM | POA: Diagnosis not present

## 2015-07-26 DIAGNOSIS — C44612 Basal cell carcinoma of skin of right upper limb, including shoulder: Secondary | ICD-10-CM | POA: Diagnosis not present

## 2015-07-26 DIAGNOSIS — D485 Neoplasm of uncertain behavior of skin: Secondary | ICD-10-CM | POA: Diagnosis not present

## 2015-07-26 DIAGNOSIS — Z85828 Personal history of other malignant neoplasm of skin: Secondary | ICD-10-CM | POA: Diagnosis not present

## 2015-07-26 DIAGNOSIS — L570 Actinic keratosis: Secondary | ICD-10-CM | POA: Diagnosis not present

## 2015-07-26 DIAGNOSIS — X32XXXA Exposure to sunlight, initial encounter: Secondary | ICD-10-CM | POA: Diagnosis not present

## 2015-07-28 DIAGNOSIS — M5412 Radiculopathy, cervical region: Secondary | ICD-10-CM | POA: Diagnosis not present

## 2015-07-28 DIAGNOSIS — G5601 Carpal tunnel syndrome, right upper limb: Secondary | ICD-10-CM | POA: Diagnosis not present

## 2015-08-01 DIAGNOSIS — I5022 Chronic systolic (congestive) heart failure: Secondary | ICD-10-CM | POA: Diagnosis not present

## 2015-08-02 DIAGNOSIS — N138 Other obstructive and reflux uropathy: Secondary | ICD-10-CM | POA: Diagnosis not present

## 2015-08-02 DIAGNOSIS — R351 Nocturia: Secondary | ICD-10-CM | POA: Diagnosis not present

## 2015-08-02 DIAGNOSIS — N401 Enlarged prostate with lower urinary tract symptoms: Secondary | ICD-10-CM | POA: Diagnosis not present

## 2015-08-05 DIAGNOSIS — I5032 Chronic diastolic (congestive) heart failure: Secondary | ICD-10-CM | POA: Diagnosis not present

## 2015-08-05 DIAGNOSIS — J45909 Unspecified asthma, uncomplicated: Secondary | ICD-10-CM | POA: Diagnosis not present

## 2015-08-05 DIAGNOSIS — I5022 Chronic systolic (congestive) heart failure: Secondary | ICD-10-CM | POA: Diagnosis not present

## 2015-08-05 DIAGNOSIS — G4733 Obstructive sleep apnea (adult) (pediatric): Secondary | ICD-10-CM | POA: Diagnosis not present

## 2015-08-09 ENCOUNTER — Ambulatory Visit (INDEPENDENT_AMBULATORY_CARE_PROVIDER_SITE_OTHER): Payer: PPO | Admitting: Family Medicine

## 2015-08-09 ENCOUNTER — Encounter: Payer: Self-pay | Admitting: Family Medicine

## 2015-08-09 VITALS — BP 154/75 | HR 72 | Temp 97.7°F | Resp 20 | Wt 305.0 lb

## 2015-08-09 DIAGNOSIS — IMO0002 Reserved for concepts with insufficient information to code with codable children: Secondary | ICD-10-CM

## 2015-08-09 DIAGNOSIS — M545 Low back pain, unspecified: Secondary | ICD-10-CM

## 2015-08-09 DIAGNOSIS — E1165 Type 2 diabetes mellitus with hyperglycemia: Secondary | ICD-10-CM

## 2015-08-09 DIAGNOSIS — E118 Type 2 diabetes mellitus with unspecified complications: Secondary | ICD-10-CM | POA: Diagnosis not present

## 2015-08-09 DIAGNOSIS — Z794 Long term (current) use of insulin: Secondary | ICD-10-CM

## 2015-08-09 LAB — POCT GLYCOSYLATED HEMOGLOBIN (HGB A1C)
Est. average glucose Bld gHb Est-mCnc: 186
Hemoglobin A1C: 8.1

## 2015-08-09 MED ORDER — EXENATIDE ER 2 MG ~~LOC~~ PEN: 2.0000 mg | PEN_INJECTOR | SUBCUTANEOUS | Status: DC

## 2015-08-09 MED ORDER — OXYCODONE HCL 5 MG PO TABS: 5.0000 mg | ORAL_TABLET | Freq: Every day | ORAL | Status: DC | PRN

## 2015-08-09 NOTE — Progress Notes (Signed)
Patient: Jacob Herrera Male    DOB: 08/13/1950   65 y.o.   MRN: [id number] Visit Date: 08/09/2015  Today's Provider: Lelon Huh, MD   Chief Complaint  Patient presents with  . Diabetes    follow up  . Chronic Kidney Disease    follow up   Subjective:    HPI  Diabetes Mellitus Type II, Follow-up:   Lab Results  Component Value Date   HGBA1C 9.3 05/12/2015   HGBA1C 8.1 02/09/2015   HGBA1C 7.4 11/09/2014    Last seen for diabetes 3 months ago.  Management since then includes starting Victoza and Titrating dose up. He reports fair compliance with treatment. Has been out of Victoza since 07/22/2015 due to being in donut hole with insurance. He is not having side effects.  Current symptoms include visual disturbances and have been stable. Home blood sugar records: fasting range: 130-250 since being off Victoza  Episodes of hypoglycemia? yes - last night   Current Insulin Regimen: Lantus 50 units daily, Humalog sliding scale Most Recent Eye Exam: < 1 year Weight trend: decreasing steadily Prior visit with dietician: no Current diet: in general, an "unhealthy" diet Current exercise: none  Pertinent Labs:    Component Value Date/Time   CHOL 144 11/09/2014 1030   CHOL 212* 05/20/2014   TRIG 109 11/09/2014 1030   HDL 46 11/09/2014 1030   HDL 45 05/20/2014   LDLCALC 76 11/09/2014 1030   LDLCALC 114 05/20/2014   CREATININE 1.38* 04/01/2015 0906   CREATININE 1.36* 07/08/2013 0555    Wt Readings from Last 3 Encounters:  08/09/15 305 lb (138.347 kg)  06/11/15 313 lb (141.976 kg)  05/12/15 307 lb (139.254 kg)    ------------------------------------------------------------------------ Follow up CKD: Last office visit was 3 months ago and no changes were made. Patient was to follow up with Nephrologist as scheduled. Patient reports that he has seen his Nephrologist since the last visit.      No Known Allergies Previous Medications   ALBUTEROL  (PROVENTIL) (2.5 MG/3ML) 0.083% NEBULIZER SOLUTION    Take 3 mLs (2.5 mg total) by nebulization every 4 (four) hours. And PRN   AMLODIPINE (NORVASC) 10 MG TABLET    Take 1 tablet (10 mg total) by mouth daily.   ASPIRIN 81 MG TABLET    Take 81 mg by mouth daily.   ATORVASTATIN (LIPITOR) 40 MG TABLET    TAKE 1 TABLET BY MOUTH AT BEDTIME   BUDESONIDE-FORMOTEROL (SYMBICORT) 80-4.5 MCG/ACT INHALER    Inhale 2 puffs into the lungs 2 (two) times daily.   CARVEDILOL (COREG) 12.5 MG TABLET    Take 12.5 mg by mouth 2 (two) times daily with a meal.   CLOPIDOGREL (PLAVIX) 75 MG TABLET    Take 1 tablet (75 mg total) by mouth daily.   ERGOCALCIFEROL (VITAMIN D2) 50000 UNITS CAPSULE    Take 50,000 Units by mouth every 30 (thirty) days. Reported on 08/09/2015   FLUTICASONE (FLONASE) 50 MCG/ACT NASAL SPRAY    Place 2 sprays into both nostrils daily.   FUROSEMIDE (LASIX) 40 MG TABLET    Take 40 mg by mouth 2 (two) times daily.    GLIPIZIDE (GLUCOTROL) 10 MG TABLET    Take 1 tablet (10 mg total) by mouth daily before breakfast.   GLUCOSE BLOOD (ONE TOUCH ULTRA TEST) TEST STRIP    USE ONE STRIP THREE TIMES DAILY   HUMALOG KWIKPEN 100 UNIT/ML KIWKPEN    INJECT UP TO 12  UNITS UNITS SUBCUTANEOUS FOUR TIMES DAILY BEFORE MEALS   HYDRALAZINE (APRESOLINE) 10 MG TABLET    Take 10 mg by mouth 2 (two) times daily.   INSULIN GLARGINE (LANTUS SOLOSTAR) 100 UNIT/ML SOLOSTAR PEN    Titrate up to 80 units, once a day   LEVOTHYROXINE (SYNTHROID, LEVOTHROID) 125 MCG TABLET    TAKE 1 TABLET (125 MCG TOTAL) BY MOUTH DAILY.   LIRAGLUTIDE (VICTOZA) 18 MG/3ML SOPN    Inject 1.8mg  daily   LOSARTAN (COZAAR) 100 MG TABLET    Take 1 tablet (100 mg total) by mouth daily.   LYSINE 500 MG CAPS    Take 1 capsule by mouth daily.   MULTIPLE VITAMINS-MINERALS ([facility] SPECTRAVITE ADULT 50+ PO)    Take 1 tablet by mouth daily.   OMEPRAZOLE (PRILOSEC) 40 MG CAPSULE    TAKE 1 CAPSULE BY MOUTH DAILY   OXYBUTYNIN (DITROPAN) 5 MG TABLET    Take 1 tablet (5  mg total) by mouth 3 (three) times daily.   PANTOPRAZOLE (PROTONIX) 40 MG TABLET    Take 1 tablet (40 mg total) by mouth daily before breakfast.   SPIRONOLACTONE (ALDACTONE) 25 MG TABLET    Take 25 mg by mouth daily.   TAMSULOSIN (FLOMAX) 0.4 MG CAPS CAPSULE    Take 1 capsule (0.4 mg total) by mouth daily.    Review of Systems  Constitutional: Negative for fever, chills and appetite change.  Eyes: Positive for visual disturbance.  Respiratory: Positive for shortness of breath. Negative for chest tightness and wheezing.   Cardiovascular: Positive for leg swelling. Negative for chest pain and palpitations.  Gastrointestinal: Negative for nausea, vomiting and abdominal pain.  Endocrine: Positive for polydipsia and polyuria. Negative for cold intolerance, heat intolerance and polyphagia.  Genitourinary: Positive for frequency.    Social History  Substance Use Topics  . Smoking status: Former Smoker -- 1.00 packs/day for 25 years    Types: Cigarettes    Quit date: 05/29/1988  . Smokeless tobacco: Current User    Types: Chew     Comment: chews 1 bag of loose leaf chew/week  . Alcohol Use: No     Comment: rare   Objective:   BP 154/75 mmHg  Pulse 72  Temp(Src) 97.7 F (36.5 C) (Oral)  Resp 20  Wt 305 lb (138.347 kg)  SpO2 95%  Physical Exam   General Appearance:    Alert, cooperative, no distress, obese  Eyes:    PERRL, conjunctiva/corneas clear, EOM's intact       Lungs:     Clear to auscultation bilaterally, respirations unlabored  Heart:    Regular rate and rhythm  Neurologic:   Awake, alert, oriented x 3. No apparent focal neurological           defect.       Results for orders placed or performed in visit on 08/09/15  POCT HgB A1C  Result Value Ref Range   Hemoglobin A1C 8.1    Est. average glucose Bld gHb Est-mCnc 186         Assessment & Plan:     1. Uncontrolled type 2 diabetes mellitus with complication, with long-term current use of insulin (HCC) Improved  with Victoza, but is two expensive. Bydureon appears to be preferred on formulary, so will change.  - POCT HgB A1C - Exenatide ER (BYDUREON) 2 MG PEN; Inject 2 mg into the skin once a week.  Dispense: 4 each; Refill: 12  2. Bilateral low back pain without sciatica He  occasionally takes 5mg  oxycodone which works well and is well tolerating. He has not had it refilled since last year. - oxyCODONE (OXY IR/ROXICODONE) 5 MG immediate release tablet; Take 1 tablet (5 mg total) by mouth daily as needed for severe pain.  Dispense: 30 tablet; Refill: 0         Lelon Huh, MD  [facility]

## 2015-08-10 ENCOUNTER — Telehealth: Payer: Self-pay | Admitting: Family Medicine

## 2015-08-10 NOTE — Telephone Encounter (Signed)
Pt stated that he was supposed to call back to let us know the dosage that is on his levothyroxine (SYNTHROID, LEVOTHROID) 125 MCG tablet bottle. Pt stated it is 125 MCG and he takes it once a day.  Pt stated that the new medication pt was supposed to start taking once a week Exenatide ER (BYDUREON) 2 MG PEN was going to cost 250$ out of pocket. Pt stated he didn't get it b/c of the cost. Pt request that Linus Salmons return his call. Thanks TNP

## 2015-08-10 NOTE — Telephone Encounter (Signed)
Patient was seen yesterday in the office and while I was reviewing his medications he was unsure of the current dose of Levothyroxine he was taking.  I asked patient to call back when he got home after checking the medication bottle. Patient called back this morning verifying that the correct dose is Levothyroxine 120mcg daily which is what is currently document in his chart.  Also patient states the Bydureon that was prescribed yesterday is going to cost him $250 out of pocket. Patient wants to know if there is something else we can do or prescribe that is more affordable?

## 2015-08-11 NOTE — Telephone Encounter (Signed)
There's not anything cheaper that he can take, but we have got samples of Bydureon. He can have 4 sample pens. Each pen is a single dose which he is to take once a week. We get quite a few samples of Bydureon, just call 4 or 5 days before he runs out to see if we have any more here.

## 2015-08-11 NOTE — Telephone Encounter (Signed)
Patient was notified.

## 2015-08-15 DIAGNOSIS — I5032 Chronic diastolic (congestive) heart failure: Secondary | ICD-10-CM | POA: Diagnosis not present

## 2015-08-15 DIAGNOSIS — J45909 Unspecified asthma, uncomplicated: Secondary | ICD-10-CM | POA: Diagnosis not present

## 2015-08-15 DIAGNOSIS — I5022 Chronic systolic (congestive) heart failure: Secondary | ICD-10-CM | POA: Diagnosis not present

## 2015-08-17 ENCOUNTER — Encounter: Payer: Self-pay | Admitting: Internal Medicine

## 2015-08-17 ENCOUNTER — Ambulatory Visit (INDEPENDENT_AMBULATORY_CARE_PROVIDER_SITE_OTHER): Payer: PPO | Admitting: Internal Medicine

## 2015-08-17 VITALS — BP 150/78 | HR 97 | Ht 68.0 in | Wt 303.4 lb

## 2015-08-17 DIAGNOSIS — G4733 Obstructive sleep apnea (adult) (pediatric): Secondary | ICD-10-CM

## 2015-08-17 MED ORDER — UMECLIDINIUM BROMIDE 62.5 MCG/INH IN AEPB: 1.0000 | INHALATION_SPRAY | Freq: Every day | RESPIRATORY_TRACT | Status: AC

## 2015-08-17 NOTE — Progress Notes (Signed)
   Subjective:    Patient ID: Jacob Herrera, male    DOB: 1951-03-12, 65 y.o.   MRN: [id number]  Synopsis: Jacob Herrera first saw the [facility] pulmonary clinic in February 2015 for evaluation of shortness of breath. He had a past medical history significant for asthma. Pulmonary function testing was normal with the exception of restriction secondary to obesity. He was started on a combination long acting bronchodilator/inhaled corticosteroid inhaler and he noted significant benefit from this.   CC: follow up SOB  HPI  H/o  acute CHF with CAD s/p cath Patient states that his SOB has improved, still with lower ext swelling is on IC/LABA, has chronic SOB/DOE  No signs of infection at this time  Patient is compliant with biPAP at this time, has been on oxygen since discharge Patient morbidly obese  Patient does NOT smoke at this time  30 day Compliance Report:  97% compliance Leak 107 L/Min AHI 10   Review of Systems  Constitutional: Positive for fatigue. Negative for fever and chills.  HENT: Positive for postnasal drip, rhinorrhea and sinus pressure.   Respiratory: Negative for chest tightness, shortness of breath and wheezing.   Cardiovascular: Positive for leg swelling. Negative for chest pain and palpitations.  Musculoskeletal: Positive for back pain, joint swelling and arthralgias.       Objective:   Physical Exam Filed Vitals:   08/17/15 0957  BP: 150/78  Pulse: 97  Height: 5\' 8"  (1.727 m)  Weight: 303 lb 6.4 oz (137.621 kg)  SpO2: 90%  RA  Gen: obese, no acute distress HENT: OP clear, TM's clear, neck supple PULM: CTA B, normal percussion CV: RRR, no mgr,  notable leg edema GI: BS+, soft, nontender Derm: no cyanosis or rash Psyche: normal mood and affect   05/2013 Full FPT > ratio 82%, FEV1 1.95L (67% pred), no change with BD, TLC 3.91 L (64% pred), ERV 0.26L (18% pred), DLCO 15.6 (54% pred)     Assessment & Plan:   64 yo morbidly bese white  male with  OSA with cardiomyopathy with s/p stent with Reactive airways disease and chronic kidney disease   Overall, patient with very poor prognosis. Patient with multiorgan dysfunction.   1.continue biPAP as prescribed, will need to adjust mask to to decrease leaks, referral made 2.re-test overnight pulse oximetry 3.albuterol as needed 4.continue symbicort, will start Incruse 5.follow up cardiology 6.follow up nephrology   The Patient requires high complexity decision making for assessment and support, frequent evaluation and titration of therapies. Patient satisfied with Plan of action and management. All questions answered  Jacob Herrera, M.D.  [facility]  Medical Director [place] Director [facility] Cardio-Pulmonary Department

## 2015-08-17 NOTE — Patient Instructions (Signed)
Sleep Apnea  Sleep apnea is a sleep disorder characterized by abnormal pauses in breathing while you sleep. When your breathing pauses, the level of oxygen in your blood decreases. This causes you to move out of deep sleep and into light sleep. As a result, your quality of sleep is poor, and the system that carries your blood throughout your body (cardiovascular system) experiences stress. If sleep apnea remains untreated, the following conditions can develop:  High blood pressure (hypertension).  Coronary artery disease.  Inability to achieve or maintain an erection (impotence).  Impairment of your thought process (cognitive dysfunction). There are three types of sleep apnea: 1. Obstructive sleep apnea--Pauses in breathing during sleep because of a blocked airway. 2. Central sleep apnea--Pauses in breathing during sleep because the area of the brain that controls your breathing does not send the correct signals to the muscles that control breathing. 3. Mixed sleep apnea--A combination of both obstructive and central sleep apnea. RISK FACTORS The following risk factors can increase your risk of developing sleep apnea:  Being overweight.  Smoking.  Having narrow passages in your nose and throat.  Being of older age.  Being male.  Alcohol use.  Sedative and tranquilizer use.  Ethnicity. Among individuals younger than 35 years, African Americans are at increased risk of sleep apnea. SYMPTOMS   Difficulty staying asleep.  Daytime sleepiness and fatigue.  Loss of energy.  Irritability.  Loud, heavy snoring.  Morning headaches.  Trouble concentrating.  Forgetfulness.  Decreased interest in sex.  Unexplained sleepiness. DIAGNOSIS  In order to diagnose sleep apnea, your caregiver will perform a physical examination. A sleep study done in the comfort of your own home may be appropriate if you are otherwise healthy. Your caregiver may also recommend that you spend the  night in a sleep lab. In the sleep lab, several monitors record information about your heart, lungs, and brain while you sleep. Your leg and arm movements and blood oxygen level are also recorded. TREATMENT The following actions may help to resolve mild sleep apnea:  Sleeping on your side.   Using a decongestant if you have nasal congestion.   Avoiding the use of depressants, including alcohol, sedatives, and narcotics.   Losing weight and modifying your diet if you are overweight. There also are devices and treatments to help open your airway:  Oral appliances. These are custom-made mouthpieces that shift your lower jaw forward and slightly open your bite. This opens your airway.  Devices that create positive airway pressure. This positive pressure "splints" your airway open to help you breathe better during sleep. The following devices create positive airway pressure:  Continuous positive airway pressure (CPAP) device. The CPAP device creates a continuous level of air pressure with an air pump. The air is delivered to your airway through a mask while you sleep. This continuous pressure keeps your airway open.  Nasal expiratory positive airway pressure (EPAP) device. The EPAP device creates positive air pressure as you exhale. The device consists of single-use valves, which are inserted into each nostril and held in place by adhesive. The valves create very little resistance when you inhale but create much more resistance when you exhale. That increased resistance creates the positive airway pressure. This positive pressure while you exhale keeps your airway open, making it easier to breath when you inhale again.  Bilevel positive airway pressure (BPAP) device. The BPAP device is used mainly in patients with central sleep apnea. This device is similar to the CPAP device because   it also uses an air pump to deliver continuous air pressure through a mask. However, with the BPAP machine, the  pressure is set at two different levels. The pressure when you exhale is lower than the pressure when you inhale.  Surgery. Typically, surgery is only done if you cannot comply with less invasive treatments or if the less invasive treatments do not improve your condition. Surgery involves removing excess tissue in your airway to create a wider passage way.   This information is not intended to replace advice given to you by your health care provider. Make sure you discuss any questions you have with your health care provider.   Document Released: 03/31/2002 Document Revised: 05/01/2014 Document Reviewed: 08/17/2011 Elsevier Interactive Patient Education 2016 Elsevier Inc.   

## 2015-08-25 ENCOUNTER — Encounter: Payer: Self-pay | Admitting: Internal Medicine

## 2015-08-25 DIAGNOSIS — I5022 Chronic systolic (congestive) heart failure: Secondary | ICD-10-CM | POA: Diagnosis not present

## 2015-08-25 DIAGNOSIS — G4733 Obstructive sleep apnea (adult) (pediatric): Secondary | ICD-10-CM | POA: Diagnosis not present

## 2015-08-25 DIAGNOSIS — J45909 Unspecified asthma, uncomplicated: Secondary | ICD-10-CM | POA: Diagnosis not present

## 2015-08-25 DIAGNOSIS — I5032 Chronic diastolic (congestive) heart failure: Secondary | ICD-10-CM | POA: Diagnosis not present

## 2015-08-30 ENCOUNTER — Telehealth: Payer: Self-pay | Admitting: *Deleted

## 2015-08-30 DIAGNOSIS — J984 Other disorders of lung: Secondary | ICD-10-CM

## 2015-08-30 NOTE — Telephone Encounter (Signed)
Per KK pt needs 2L o2 attached to BiPAP machine. Pt informed. Order placed.

## 2015-08-31 DIAGNOSIS — I5022 Chronic systolic (congestive) heart failure: Secondary | ICD-10-CM | POA: Diagnosis not present

## 2015-09-01 ENCOUNTER — Telehealth: Payer: Self-pay | Admitting: *Deleted

## 2015-09-01 NOTE — Telephone Encounter (Signed)
Patient called office to schedule ov appt. Patient is having sob, pain in chest, dizziness, and a flushed sensation. Patient stated that he has had this symptoms for 3-4 days. Per Dr. Caryn Section patient was advised to go to ER. Patient refused and stated that he will wait until the morning to see Dr. Caryn Section in office.

## 2015-09-02 ENCOUNTER — Ambulatory Visit
Admission: RE | Admit: 2015-09-02 | Discharge: 2015-09-02 | Disposition: A | Discharge status: Home / Self Care | Payer: PPO | Source: Ambulatory Visit | Attending: Family Medicine | Admitting: Family Medicine

## 2015-09-02 ENCOUNTER — Ambulatory Visit (INDEPENDENT_AMBULATORY_CARE_PROVIDER_SITE_OTHER): Payer: PPO | Admitting: Family Medicine

## 2015-09-02 ENCOUNTER — Encounter: Payer: Self-pay | Admitting: Family Medicine

## 2015-09-02 VITALS — BP 144/66 | HR 76 | Temp 97.8°F | Resp 28 | Wt 304.0 lb

## 2015-09-02 DIAGNOSIS — J189 Pneumonia, unspecified organism: Secondary | ICD-10-CM | POA: Insufficient documentation

## 2015-09-02 DIAGNOSIS — J449 Chronic obstructive pulmonary disease, unspecified: Secondary | ICD-10-CM | POA: Insufficient documentation

## 2015-09-02 DIAGNOSIS — R0602 Shortness of breath: Secondary | ICD-10-CM

## 2015-09-02 DIAGNOSIS — J181 Lobar pneumonia, unspecified organism: Secondary | ICD-10-CM

## 2015-09-02 DIAGNOSIS — R05 Cough: Secondary | ICD-10-CM

## 2015-09-02 DIAGNOSIS — R059 Cough, unspecified: Secondary | ICD-10-CM

## 2015-09-02 DIAGNOSIS — J45909 Unspecified asthma, uncomplicated: Secondary | ICD-10-CM | POA: Diagnosis not present

## 2015-09-02 MED ORDER — LEVOFLOXACIN 500 MG PO TABS: 500.0000 mg | ORAL_TABLET | Freq: Every day | ORAL | Status: DC

## 2015-09-02 NOTE — Progress Notes (Signed)
Patient: Jacob Herrera Male    DOB: 06-06-1950   65 y.o.   MRN: [id number] Visit Date: 09/02/2015  Today's Provider: Lelon Huh, MD   Chief Complaint  Patient presents with  . Shortness of Breath   Subjective:    Shortness of Breath This is a chronic problem. Episode onset: 6 days ago. The problem occurs intermittently. The problem has been gradually worsening. Associated symptoms include abdominal pain (upper abdomen; improved), a fever (subjective), headaches, neck pain (back of the neck), sputum production (yellow colored) and wheezing. Pertinent negatives include no chest pain, ear pain, hemoptysis, leg pain, leg swelling, PND, rash, rhinorrhea, sore throat, syncope or vomiting. The symptoms are aggravated by exercise and any activity (exertion). He has tried rest (presription inhalers) for the symptoms. The treatment provided mild relief.  Was having pain in ribs when taking a breath. He took Tylenol yesterday afternoon and pain has since mostly resolved.   Doing well with Bydureon, fasting sugars have in the low to mid 100s. No low.     No Known Allergies Previous Medications   ALBUTEROL (PROVENTIL) (2.5 MG/3ML) 0.083% NEBULIZER SOLUTION    Take 3 mLs (2.5 mg total) by nebulization every 4 (four) hours. And PRN   AMLODIPINE (NORVASC) 10 MG TABLET    Take 1 tablet (10 mg total) by mouth daily.   ASPIRIN 81 MG TABLET    Take 81 mg by mouth daily.   ATORVASTATIN (LIPITOR) 40 MG TABLET    TAKE 1 TABLET BY MOUTH AT BEDTIME   BUDESONIDE-FORMOTEROL (SYMBICORT) 80-4.5 MCG/ACT INHALER    Inhale 2 puffs into the lungs 2 (two) times daily.   CARVEDILOL (COREG) 12.5 MG TABLET    Take 12.5 mg by mouth 2 (two) times daily with a meal.   CLOPIDOGREL (PLAVIX) 75 MG TABLET    Take 1 tablet (75 mg total) by mouth daily.   EXENATIDE ER (BYDUREON) 2 MG PEN    Inject 2 mg into the skin once a week.   FLUTICASONE (FLONASE) 50 MCG/ACT NASAL SPRAY    Place 2 sprays into both nostrils daily.    FUROSEMIDE (LASIX) 40 MG TABLET    Take 40 mg by mouth 2 (two) times daily.    GLIPIZIDE (GLUCOTROL) 10 MG TABLET    Take 1 tablet (10 mg total) by mouth daily before breakfast.   GLUCOSE BLOOD (ONE TOUCH ULTRA TEST) TEST STRIP    USE ONE STRIP THREE TIMES DAILY   HUMALOG KWIKPEN 100 UNIT/ML KIWKPEN    INJECT UP TO 12 UNITS UNITS SUBCUTANEOUS FOUR TIMES DAILY BEFORE MEALS   HYDRALAZINE (APRESOLINE) 10 MG TABLET    Take 10 mg by mouth 2 (two) times daily.   INSULIN GLARGINE (LANTUS SOLOSTAR) 100 UNIT/ML SOLOSTAR PEN    Titrate up to 80 units, once a day   LEVOTHYROXINE (SYNTHROID, LEVOTHROID) 125 MCG TABLET    TAKE 1 TABLET (125 MCG TOTAL) BY MOUTH DAILY.   LOSARTAN (COZAAR) 100 MG TABLET    Take 1 tablet (100 mg total) by mouth daily.   LYSINE 500 MG CAPS    Take 1 capsule by mouth daily.   MULTIPLE VITAMINS-MINERALS ([employer] SPECTRAVITE ADULT 50+ PO)    Take 1 tablet by mouth daily.   OMEPRAZOLE (PRILOSEC) 40 MG CAPSULE    TAKE 1 CAPSULE BY MOUTH DAILY   OXYBUTYNIN (DITROPAN) 5 MG TABLET    Take 1 tablet (5 mg total) by mouth 3 (three) times daily.  OXYCODONE (OXY IR/ROXICODONE) 5 MG IMMEDIATE RELEASE TABLET    Take 1 tablet (5 mg total) by mouth daily as needed for severe pain.   PANTOPRAZOLE (PROTONIX) 40 MG TABLET    Take 1 tablet (40 mg total) by mouth daily before breakfast.   SPIRONOLACTONE (ALDACTONE) 25 MG TABLET    Take 25 mg by mouth daily.   TAMSULOSIN (FLOMAX) 0.4 MG CAPS CAPSULE    Take 1 capsule (0.4 mg total) by mouth daily.   UMECLIDINIUM BROMIDE (INCRUSE ELLIPTA) 62.5 MCG/INH AEPB    Inhale 1 puff into the lungs daily.    Review of Systems  Constitutional: Positive for fever (subjective), chills and fatigue. Negative for diaphoresis and appetite change.  HENT: Positive for congestion (sinus), postnasal drip and sneezing. Negative for ear pain, mouth sores, nosebleeds, rhinorrhea, sinus pressure and sore throat.   Eyes: Positive for discharge (watery eyes).  Respiratory:  Positive for sputum production (yellow colored), shortness of breath and wheezing. Negative for cough, hemoptysis and chest tightness.   Cardiovascular: Negative for chest pain, palpitations, leg swelling, syncope and PND.  Gastrointestinal: Positive for abdominal pain (upper abdomen; improved). Negative for nausea and vomiting.  Musculoskeletal: Positive for neck pain (back of the neck).  Skin: Negative for rash.  Neurological: Positive for dizziness, light-headedness and headaches.    Social History  Substance Use Topics  . Smoking status: Former Smoker -- 1.00 packs/day for 25 years    Types: Cigarettes    Quit date: 05/29/1988  . Smokeless tobacco: Current User    Types: Chew     Comment: chews 1 bag of loose leaf chew/week  . Alcohol Use: No     Comment: rare   Objective:   BP 144/66 mmHg  Pulse 76  Temp(Src) 97.8 F (36.6 C) (Oral)  Resp 28  Wt 304 lb (137.893 kg)  SpO2 92%  Physical Exam  General Appearance:    Alert, cooperative, no distress  HENT:   ENT exam normal, no neck nodes or sinus tenderness  Eyes:    PERRL, conjunctiva/corneas clear, EOM's intact       Lungs:     Occasional faint left lower lung crackles, no wheezing respirations unlabored  Heart:    Regular rate and rhythm  Neurologic:   Awake, alert, oriented x 3. No apparent focal neurological           defect.       Dg Chest 2 View  09/02/2015  CLINICAL DATA:  4-5 day history of shortness of breath; history of asthma, previous episodes of pneumonia ; coronary stent placement in December 2016, former smoker.  EXAM: CHEST  2 VIEW  COMPARISON:  Portable chest x-ray of April 02, 2015.  FINDINGS: The lungs are adequately inflated. There are coarse lung markings in the left lower lobe. A trace of pleural fluid on the left is suspected. The cardiac silhouette is enlarged. The pulmonary vascularity is not engorged. The trachea is midline. The bony thorax exhibits no acute abnormality.  IMPRESSION: Left lower  lobe pneumonia.  Underlying COPD-reactive airway disease. Followup PA and lateral chest X-ray is recommended in 3-4 weeks following trial of antibiotic therapy to ensure resolution and exclude underlying malignancy. Electronically Signed   By: David  Martinique M.D.   On: 09/02/2015 10:26       Assessment & Plan:     1. Cough  - DG Chest 2 View; Future  2. Shortness of breath  - DG Chest 2 View; Future  3. Left  lower lobe pneumonia  - levofloxacin (LEVAQUIN) 500 MG tablet; Take 1 tablet (500 mg total) by mouth daily.  Dispense: 10 tablet; Refill: 0     The entirety of the information documented in the History of Present Illness, Review of Systems and Physical Exam were personally obtained by me. Portions of this information were initially documented by Meyer Cory, CMA  and reviewed by me for thoroughness and accuracy.       Lelon Huh, MD  [facility]

## 2015-09-04 DIAGNOSIS — I5022 Chronic systolic (congestive) heart failure: Secondary | ICD-10-CM | POA: Diagnosis not present

## 2015-09-04 DIAGNOSIS — I5032 Chronic diastolic (congestive) heart failure: Secondary | ICD-10-CM | POA: Diagnosis not present

## 2015-09-04 DIAGNOSIS — J45909 Unspecified asthma, uncomplicated: Secondary | ICD-10-CM | POA: Diagnosis not present

## 2015-09-04 DIAGNOSIS — G4733 Obstructive sleep apnea (adult) (pediatric): Secondary | ICD-10-CM | POA: Diagnosis not present

## 2015-09-09 ENCOUNTER — Ambulatory Visit: Payer: PPO | Admitting: Family Medicine

## 2015-09-10 ENCOUNTER — Ambulatory Visit
Admission: RE | Admit: 2015-09-10 | Discharge: 2015-09-10 | Disposition: A | Discharge status: Home / Self Care | Payer: PPO | Source: Ambulatory Visit | Attending: Family Medicine | Admitting: Family Medicine

## 2015-09-10 ENCOUNTER — Ambulatory Visit (INDEPENDENT_AMBULATORY_CARE_PROVIDER_SITE_OTHER): Payer: PPO | Admitting: Family Medicine

## 2015-09-10 VITALS — BP 142/60 | HR 80 | Temp 97.7°F | Resp 18 | Wt 300.0 lb

## 2015-09-10 DIAGNOSIS — J90 Pleural effusion, not elsewhere classified: Secondary | ICD-10-CM | POA: Diagnosis not present

## 2015-09-10 DIAGNOSIS — Z8701 Personal history of pneumonia (recurrent): Secondary | ICD-10-CM | POA: Insufficient documentation

## 2015-09-10 DIAGNOSIS — Z09 Encounter for follow-up examination after completed treatment for conditions other than malignant neoplasm: Secondary | ICD-10-CM | POA: Insufficient documentation

## 2015-09-10 DIAGNOSIS — J189 Pneumonia, unspecified organism: Secondary | ICD-10-CM | POA: Diagnosis not present

## 2015-09-10 DIAGNOSIS — Z1211 Encounter for screening for malignant neoplasm of colon: Secondary | ICD-10-CM

## 2015-09-10 DIAGNOSIS — I517 Cardiomegaly: Secondary | ICD-10-CM | POA: Insufficient documentation

## 2015-09-10 DIAGNOSIS — K5900 Constipation, unspecified: Secondary | ICD-10-CM

## 2015-09-10 DIAGNOSIS — K5909 Other constipation: Secondary | ICD-10-CM

## 2015-09-10 NOTE — Progress Notes (Signed)
Patient ID: Jacob Herrera, male   DOB: 17-Oct-1950, 65 y.o.   MRN: [id number]       Patient: Jacob Herrera Male    DOB: 1951/02/23   65 y.o.   MRN: [id number] Visit Date: 09/10/2015  Today's Provider: Lelon Huh, MD   Chief Complaint  Patient presents with  . Pneumonia    follow up   Subjective:    HPI Pt is here today for a follow up of Pneumonia. He was seen on 09/02/15 for l, cough, SOB and pleuritic pain. Chest x ray was ordered and confirmed eft lower lobe pneumonia. Pt was started on Levaquin and advised to cut Glipizide in half while taking the Levaquin due to interaction. Pt reports that he is feeling much better. His chest pain has resolved, his shortness of breath is better. He is slowly getting his appetite back. After walking back to the exam room his O2 stats were 90% then went up to 96%  Also complains of frequent constipation getting worse over the last few years. Is taking 2 stool softeners every day. No blood in stool. Last colonoscopy was in 2006.     No Known Allergies Previous Medications   ALBUTEROL (PROVENTIL) (2.5 MG/3ML) 0.083% NEBULIZER SOLUTION    Take 3 mLs (2.5 mg total) by nebulization every 4 (four) hours. And PRN   AMLODIPINE (NORVASC) 10 MG TABLET    Take 1 tablet (10 mg total) by mouth daily.   ASPIRIN 81 MG TABLET    Take 81 mg by mouth daily.   ATORVASTATIN (LIPITOR) 40 MG TABLET    TAKE 1 TABLET BY MOUTH AT BEDTIME   BUDESONIDE-FORMOTEROL (SYMBICORT) 80-4.5 MCG/ACT INHALER    Inhale 2 puffs into the lungs 2 (two) times daily.   CARVEDILOL (COREG) 12.5 MG TABLET    Take 12.5 mg by mouth 2 (two) times daily with a meal.   CLOPIDOGREL (PLAVIX) 75 MG TABLET    Take 1 tablet (75 mg total) by mouth daily.   EXENATIDE ER (BYDUREON) 2 MG PEN    Inject 2 mg into the skin once a week.   FLUTICASONE (FLONASE) 50 MCG/ACT NASAL SPRAY    Place 2 sprays into both nostrils daily.   FUROSEMIDE (LASIX) 40 MG TABLET    Take 40 mg by mouth 2 (two) times daily.     GLIPIZIDE (GLUCOTROL) 10 MG TABLET    Take 1 tablet (10 mg total) by mouth daily before breakfast.   GLUCOSE BLOOD (ONE TOUCH ULTRA TEST) TEST STRIP    USE ONE STRIP THREE TIMES DAILY   HUMALOG KWIKPEN 100 UNIT/ML KIWKPEN    INJECT UP TO 12 UNITS UNITS SUBCUTANEOUS FOUR TIMES DAILY BEFORE MEALS   HYDRALAZINE (APRESOLINE) 10 MG TABLET    Take 10 mg by mouth 2 (two) times daily.   INSULIN GLARGINE (LANTUS SOLOSTAR) 100 UNIT/ML SOLOSTAR PEN    Titrate up to 80 units, once a day   LEVOFLOXACIN (LEVAQUIN) 500 MG TABLET    Take 1 tablet (500 mg total) by mouth daily.   LEVOTHYROXINE (SYNTHROID, LEVOTHROID) 125 MCG TABLET    TAKE 1 TABLET (125 MCG TOTAL) BY MOUTH DAILY.   LOSARTAN (COZAAR) 100 MG TABLET    Take 1 tablet (100 mg total) by mouth daily.   LYSINE 500 MG CAPS    Take 1 capsule by mouth daily.   MULTIPLE VITAMINS-MINERALS ([employer]S SPECTRAVITE ADULT 50+ PO)    Take 1 tablet by mouth daily.   OMEPRAZOLE (PRILOSEC) 40  MG CAPSULE    TAKE 1 CAPSULE BY MOUTH DAILY   OXYBUTYNIN (DITROPAN) 5 MG TABLET    Take 1 tablet (5 mg total) by mouth 3 (three) times daily.   OXYCODONE (OXY IR/ROXICODONE) 5 MG IMMEDIATE RELEASE TABLET    Take 1 tablet (5 mg total) by mouth daily as needed for severe pain.   PANTOPRAZOLE (PROTONIX) 40 MG TABLET    Take 1 tablet (40 mg total) by mouth daily before breakfast.   SPIRONOLACTONE (ALDACTONE) 25 MG TABLET    Take 25 mg by mouth daily.   TAMSULOSIN (FLOMAX) 0.4 MG CAPS CAPSULE    Take 1 capsule (0.4 mg total) by mouth daily.   UMECLIDINIUM BROMIDE (INCRUSE ELLIPTA) 62.5 MCG/INH AEPB    Inhale 1 puff into the lungs daily.    Review of Systems  Constitutional: Positive for appetite change.  HENT: Negative.   Eyes: Negative.   Respiratory: Positive for cough (mostly in the mornings, not a constant cough) and shortness of breath.   Cardiovascular: Negative.  Negative for chest pain.  Gastrointestinal: Negative.   Endocrine: Negative.   Genitourinary: Negative.     Musculoskeletal: Negative.   Skin: Negative.   Allergic/Immunologic: Negative.   Neurological: Negative.   Hematological: Negative.   Psychiatric/Behavioral: Negative.     Social History  Substance Use Topics  . Smoking status: Former Smoker -- 1.00 packs/day for 25 years    Types: Cigarettes    Quit date: 05/29/1988  . Smokeless tobacco: Current User    Types: Chew     Comment: chews 1 bag of loose leaf chew/week  . Alcohol Use: No     Comment: rare   Objective:   BP 142/60 mmHg  Pulse 80  Temp(Src) 97.7 F (36.5 C) (Oral)  Resp 18  Wt 300 lb (136.079 kg)  SpO2 96%  Physical Exam   General Appearance:    Alert, cooperative, no distress  Eyes:    PERRL, conjunctiva/corneas clear, EOM's intact       Lungs:     Clear to auscultation bilaterally, respirations unlabored  Heart:    Regular rate and rhythm  Neurologic:   Awake, alert, oriented x 3. No apparent focal neurological           defect.          Assessment & Plan:     1. Pneumonia, unspecified laterality, unspecified part of lung Symptomatically resolved. Check for radiologic clearing.  - DG Chest 2 View; Future  2. Chronic constipation OTC metamucil every day  3. Colon cancer screening Does not want another colonoscopy.  - Cologuard    The entirety of the information documented in the History of Present Illness, Review of Systems and Physical Exam were personally obtained by me. Portions of this information were initially documented by Webb Laws, [place] and reviewed by me for thoroughness and accuracy.       Lelon Huh, MD  [facility]

## 2015-09-10 NOTE — Patient Instructions (Addendum)
Recommend that you take OTC Metamucil every night for constipation  Go to the [facility] on [facility] for Chest Xray

## 2015-09-13 ENCOUNTER — Telehealth: Payer: Self-pay

## 2015-09-13 DIAGNOSIS — J189 Pneumonia, unspecified organism: Secondary | ICD-10-CM

## 2015-09-13 DIAGNOSIS — J181 Lobar pneumonia, unspecified organism: Principal | ICD-10-CM

## 2015-09-13 MED ORDER — LEVOFLOXACIN 500 MG PO TABS: 500.0000 mg | ORAL_TABLET | Freq: Every day | ORAL | Status: AC

## 2015-09-13 NOTE — Telephone Encounter (Signed)
Advised  ED 

## 2015-09-13 NOTE — Telephone Encounter (Signed)
-----   Message from [name], MD sent at 09/11/2015 12:58 PM EDT ----- Lung infection has completely cleared. No more antiobiotics are needed.

## 2015-09-13 NOTE — Telephone Encounter (Signed)
Patient advised of the results of his x-ray.  He states he was going to call you today because he finished the antibiotics on Saturday and on Sunday he started having chest pain when he breaths in and it feels just like when he started with the pneumonia.  He said that today it is slightly worse.  Please advised.

## 2015-09-13 NOTE — Telephone Encounter (Signed)
Have sent in 10 more days of Levaquin. Remember to only take 1/2 tablet of Glipizide while he is taking levoquin. Call if not rapidly improving or if not completely better when finished with Levaquin.

## 2015-09-14 DIAGNOSIS — I5032 Chronic diastolic (congestive) heart failure: Secondary | ICD-10-CM | POA: Diagnosis not present

## 2015-09-14 DIAGNOSIS — J45909 Unspecified asthma, uncomplicated: Secondary | ICD-10-CM | POA: Diagnosis not present

## 2015-09-14 DIAGNOSIS — I5022 Chronic systolic (congestive) heart failure: Secondary | ICD-10-CM | POA: Diagnosis not present

## 2015-09-17 DIAGNOSIS — I5022 Chronic systolic (congestive) heart failure: Secondary | ICD-10-CM | POA: Diagnosis not present

## 2015-09-17 DIAGNOSIS — I5032 Chronic diastolic (congestive) heart failure: Secondary | ICD-10-CM | POA: Diagnosis not present

## 2015-09-17 DIAGNOSIS — J45909 Unspecified asthma, uncomplicated: Secondary | ICD-10-CM | POA: Diagnosis not present

## 2015-09-17 DIAGNOSIS — C44612 Basal cell carcinoma of skin of right upper limb, including shoulder: Secondary | ICD-10-CM | POA: Diagnosis not present

## 2015-09-17 DIAGNOSIS — G4733 Obstructive sleep apnea (adult) (pediatric): Secondary | ICD-10-CM | POA: Diagnosis not present

## 2015-09-23 ENCOUNTER — Other Ambulatory Visit: Payer: Self-pay

## 2015-09-23 ENCOUNTER — Telehealth: Payer: Self-pay | Admitting: Family Medicine

## 2015-09-23 MED ORDER — UMECLIDINIUM BROMIDE 62.5 MCG/INH IN AEPB: 1.0000 | INHALATION_SPRAY | Freq: Every day | RESPIRATORY_TRACT | Status: DC

## 2015-09-23 NOTE — Telephone Encounter (Signed)
Pt wanted to see if we have samples of Exenatide ER (BYDUREON) 2 MG PEN and if we do can he pick them up. Pt stated he was advised to contact the office to see if we have samples because his out of pocket cost is so high. Please advise. Thanks TNP

## 2015-09-24 NOTE — Telephone Encounter (Signed)
There is one sample bottle in the fridge he can have.

## 2015-09-24 NOTE — Telephone Encounter (Signed)
Patient was notified.

## 2015-09-24 NOTE — Telephone Encounter (Signed)
Please advise 

## 2015-10-01 DIAGNOSIS — I5022 Chronic systolic (congestive) heart failure: Secondary | ICD-10-CM | POA: Diagnosis not present

## 2015-10-05 DIAGNOSIS — J45909 Unspecified asthma, uncomplicated: Secondary | ICD-10-CM | POA: Diagnosis not present

## 2015-10-05 DIAGNOSIS — I5032 Chronic diastolic (congestive) heart failure: Secondary | ICD-10-CM | POA: Diagnosis not present

## 2015-10-05 DIAGNOSIS — G4733 Obstructive sleep apnea (adult) (pediatric): Secondary | ICD-10-CM | POA: Diagnosis not present

## 2015-10-05 DIAGNOSIS — I5022 Chronic systolic (congestive) heart failure: Secondary | ICD-10-CM | POA: Diagnosis not present

## 2015-10-06 DIAGNOSIS — E1122 Type 2 diabetes mellitus with diabetic chronic kidney disease: Secondary | ICD-10-CM | POA: Diagnosis not present

## 2015-10-06 DIAGNOSIS — R809 Proteinuria, unspecified: Secondary | ICD-10-CM | POA: Diagnosis not present

## 2015-10-06 DIAGNOSIS — N183 Chronic kidney disease, stage 3 (moderate): Secondary | ICD-10-CM | POA: Diagnosis not present

## 2015-10-06 DIAGNOSIS — I129 Hypertensive chronic kidney disease with stage 1 through stage 4 chronic kidney disease, or unspecified chronic kidney disease: Secondary | ICD-10-CM | POA: Diagnosis not present

## 2015-10-15 DIAGNOSIS — J45909 Unspecified asthma, uncomplicated: Secondary | ICD-10-CM | POA: Diagnosis not present

## 2015-10-15 DIAGNOSIS — I5022 Chronic systolic (congestive) heart failure: Secondary | ICD-10-CM | POA: Diagnosis not present

## 2015-10-15 DIAGNOSIS — I5032 Chronic diastolic (congestive) heart failure: Secondary | ICD-10-CM | POA: Diagnosis not present

## 2015-10-22 DIAGNOSIS — E1122 Type 2 diabetes mellitus with diabetic chronic kidney disease: Secondary | ICD-10-CM | POA: Diagnosis not present

## 2015-10-22 DIAGNOSIS — N041 Nephrotic syndrome with focal and segmental glomerular lesions: Secondary | ICD-10-CM | POA: Diagnosis not present

## 2015-10-22 DIAGNOSIS — N183 Chronic kidney disease, stage 3 (moderate): Secondary | ICD-10-CM | POA: Diagnosis not present

## 2015-10-22 DIAGNOSIS — I10 Essential (primary) hypertension: Secondary | ICD-10-CM | POA: Diagnosis not present

## 2015-10-22 DIAGNOSIS — R809 Proteinuria, unspecified: Secondary | ICD-10-CM | POA: Diagnosis not present

## 2015-10-22 DIAGNOSIS — N2581 Secondary hyperparathyroidism of renal origin: Secondary | ICD-10-CM | POA: Diagnosis not present

## 2015-10-31 DIAGNOSIS — I5022 Chronic systolic (congestive) heart failure: Secondary | ICD-10-CM | POA: Diagnosis not present

## 2015-11-04 DIAGNOSIS — G4733 Obstructive sleep apnea (adult) (pediatric): Secondary | ICD-10-CM | POA: Diagnosis not present

## 2015-11-04 DIAGNOSIS — I5032 Chronic diastolic (congestive) heart failure: Secondary | ICD-10-CM | POA: Diagnosis not present

## 2015-11-04 DIAGNOSIS — I5022 Chronic systolic (congestive) heart failure: Secondary | ICD-10-CM | POA: Diagnosis not present

## 2015-11-04 DIAGNOSIS — J45909 Unspecified asthma, uncomplicated: Secondary | ICD-10-CM | POA: Diagnosis not present

## 2015-11-07 ENCOUNTER — Other Ambulatory Visit: Payer: Self-pay | Admitting: Family Medicine

## 2015-11-09 ENCOUNTER — Encounter: Payer: Self-pay | Admitting: Family Medicine

## 2015-11-09 ENCOUNTER — Ambulatory Visit (INDEPENDENT_AMBULATORY_CARE_PROVIDER_SITE_OTHER): Payer: PPO | Admitting: Family Medicine

## 2015-11-09 VITALS — BP 130/80 | HR 95 | Temp 98.0°F | Resp 18 | Wt 289.0 lb

## 2015-11-09 DIAGNOSIS — E1121 Type 2 diabetes mellitus with diabetic nephropathy: Secondary | ICD-10-CM

## 2015-11-09 DIAGNOSIS — I25110 Atherosclerotic heart disease of native coronary artery with unstable angina pectoris: Secondary | ICD-10-CM

## 2015-11-09 DIAGNOSIS — E039 Hypothyroidism, unspecified: Secondary | ICD-10-CM | POA: Diagnosis not present

## 2015-11-09 DIAGNOSIS — M545 Low back pain, unspecified: Secondary | ICD-10-CM

## 2015-11-09 LAB — POCT GLYCOSYLATED HEMOGLOBIN (HGB A1C)
Est. average glucose Bld gHb Est-mCnc: 146
Hemoglobin A1C: 6.7

## 2015-11-09 MED ORDER — OXYCODONE HCL 5 MG PO TABS: 5.0000 mg | ORAL_TABLET | Freq: Every day | ORAL | Status: DC | PRN

## 2015-11-09 NOTE — Progress Notes (Signed)
Patient: Jacob Herrera Male    DOB: 1950/08/11   65 y.o.   MRN: [id number] Visit Date: 11/09/2015  Today's Provider: Lelon Huh, MD   Chief Complaint  Patient presents with  . Follow-up  . Diabetes  . Hypertension  . Back Pain   Subjective:    HPI   Bilateral low back pain without sciatica: He occasionally takes 5mg  oxycodone which works well and is well tolerated  Chronic kidney disease (CKD), stage III (moderate): Stable. Continue regular follow up with nephrology.     Diabetes Mellitus Type II, Follow-up:   Lab Results  Component Value Date   HGBA1C 8.1 08/09/2015   HGBA1C 9.3 05/12/2015   HGBA1C 8.1 02/09/2015   Last seen for diabetes 4 months ago.  Management since then includes; changed victoza to bydureon due to formulary. He reports good compliance with treatment. He is not having side effects. nnoe Current symptoms include none and have been unchanged. Home blood sugar records: fasting range: 147-201  Episodes of hypoglycemia? no   Current Insulin Regimen: 30-60 units of bydureon  Most Recent Eye Exam: 01/20/15 Weight trend: decreasing steadily Prior visit with dietician: no Current diet: well balanced Current exercise: none  ----------------------------------------------------------------------    Hypertension, follow-up:  BP Readings from Last 3 Encounters:  09/10/15 142/60  09/02/15 144/66  08/17/15 150/78    He was last seen for hypertension 10 months ago.  BP at that visit was 124/88. Management since that visit includes; Well controlled. Mostly managed by nephrology. Continue current medications.He reports good compliance with treatment. He is not having side effects. none  He is not exercising. He is adherent to low salt diet.   Outside blood pressures are n/a. He is experiencing none.  Patient denies none.   Cardiovascular risk factors include diabetes mellitus.  Use of agents associated with hypertension: none.    ----------------------------------------------------------------------     No Known Allergies Current Meds  Medication Sig  . albuterol (PROVENTIL) (2.5 MG/3ML) 0.083% nebulizer solution Take 3 mLs (2.5 mg total) by nebulization every 4 (four) hours. And PRN  . amLODipine (NORVASC) 10 MG tablet Take 1 tablet (10 mg total) by mouth daily.  [name] Kitchen aspirin 81 MG tablet Take 81 mg by mouth daily.  [name] Kitchen atorvastatin (LIPITOR) 40 MG tablet TAKE 1 TABLET BY MOUTH AT BEDTIME  . budesonide-formoterol (SYMBICORT) 80-4.5 MCG/ACT inhaler Inhale 2 puffs into the lungs 2 (two) times daily.  . carvedilol (COREG) 12.5 MG tablet Take 12.5 mg by mouth 2 (two) times daily with a meal.  . clopidogrel (PLAVIX) 75 MG tablet Take 1 tablet (75 mg total) by mouth daily.  . Exenatide ER (BYDUREON) 2 MG PEN Inject 2 mg into the skin once a week.  . fluticasone (FLONASE) 50 MCG/ACT nasal spray Place 2 sprays into both nostrils daily.  . furosemide (LASIX) 40 MG tablet Take 40 mg by mouth 2 (two) times daily.   [name] Kitchen glipiZIDE (GLUCOTROL) 10 MG tablet Take 1 tablet (10 mg total) by mouth daily before breakfast.  . glucose blood (ONE TOUCH ULTRA TEST) test strip USE ONE STRIP THREE TIMES DAILY  . HUMALOG KWIKPEN 100 UNIT/ML KiwkPen INJECT UP TO 12 UNITS UNITS SUBCUTANEOUS FOUR TIMES DAILY BEFORE MEALS  . hydrALAZINE (APRESOLINE) 10 MG tablet Take 10 mg by mouth 2 (two) times daily.  . Insulin Glargine (LANTUS SOLOSTAR) 100 UNIT/ML Solostar Pen Titrate up to 80 units, once a day  . levothyroxine (SYNTHROID, LEVOTHROID) 125 MCG tablet  TAKE 1 TABLET (125 MCG TOTAL) BY MOUTH DAILY.  [name] Kitchen losartan (COZAAR) 100 MG tablet Take 1 tablet (100 mg total) by mouth daily.  [name] Kitchen Lysine 500 MG CAPS Take 1 capsule by mouth daily.  . Multiple Vitamins-Minerals ([employer] SPECTRAVITE ADULT 50+ PO) Take 1 tablet by mouth daily.  [name] Kitchen omeprazole (PRILOSEC) 40 MG capsule TAKE 1 CAPSULE BY MOUTH DAILY  . oxybutynin (DITROPAN) 5 MG tablet Take 1 tablet (5 mg  total) by mouth 3 (three) times daily. (Patient taking differently: Take 5 mg by mouth daily. )  . oxyCODONE (OXY IR/ROXICODONE) 5 MG immediate release tablet Take 1 tablet (5 mg total) by mouth daily as needed for severe pain.  . pantoprazole (PROTONIX) 40 MG tablet Take 1 tablet (40 mg total) by mouth daily before breakfast.  . sodium bicarbonate 650 MG tablet Take 650 mg by mouth 2 (two) times daily.  [name] Kitchen spironolactone (ALDACTONE) 25 MG tablet Take 25 mg by mouth daily.  . tamsulosin (FLOMAX) 0.4 MG CAPS capsule Take 1 capsule (0.4 mg total) by mouth daily. (Patient taking differently: Take 0.4 mg by mouth 2 (two) times daily. )  . umeclidinium bromide (INCRUSE ELLIPTA) 62.5 MCG/INH AEPB Inhale 1 puff into the lungs daily.    Review of Systems  Constitutional: Positive for appetite change. Negative for fever and chills.  Respiratory: Negative for chest tightness, shortness of breath and wheezing.   Cardiovascular: Negative for chest pain and palpitations.  Gastrointestinal: Negative for nausea, vomiting and abdominal pain.    Social History  Substance Use Topics  . Smoking status: Former Smoker -- 1.00 packs/day for 25 years    Types: Cigarettes    Quit date: 05/29/1988  . Smokeless tobacco: Current User    Types: Chew     Comment: chews 1 bag of loose leaf chew/week  . Alcohol Use: No     Comment: rare   Objective:   BP 130/80 mmHg  Pulse 95  Temp(Src) 98 F (36.7 C) (Oral)  Resp 18  Wt 289 lb (131.09 kg)  SpO2 95%  Physical Exam  General Appearance:    Alert, cooperative, no distress, obese  Eyes:    PERRL, conjunctiva/corneas clear, EOM's intact       Lungs:     Clear to auscultation bilaterally, respirations unlabored  Heart:    Regular rate and rhythm  Neurologic:   Awake, alert, oriented x 3. No apparent focal neurological           defect.       Results for orders placed or performed in visit on 11/09/15  POCT glycosylated hemoglobin (Hb A1C)  Result Value Ref  Range   Hemoglobin A1C 6.7    Est. average glucose Bld gHb Est-mCnc 146        Assessment & Plan:     1. Diabetes mellitus with nephropathy ([place]) Doing much better with a1c below goal. Continue current medications.   - POCT glycosylated hemoglobin (Hb A1C)  2. Bilateral low back pain without sciatica Stable. Refill oxycodone - oxyCODONE (OXY IR/ROXICODONE) 5 MG immediate release tablet; Take 1 tablet (5 mg total) by mouth daily as needed for severe pain.  Dispense: 30 tablet; Refill: 0  3. Hypothyroidism, unspecified hypothyroidism type Due to check tsh - TSH  4. Coronary artery disease involving native coronary artery of native heart with unstable angina pectoris (HCC) Asymptomatic. Compliant with medication.  Continue aggressive risk factor modification.   - Lipid panel  Return in about 4 months (around  03/11/2016).      The entirety of the information documented in the History of Present Illness, Review of Systems and Physical Exam were personally obtained by me. Portions of this information were initially documented by April M. Sabra Heck, CMA and reviewed by me for thoroughness and accuracy.    Lelon Huh, MD  [facility]

## 2015-11-10 ENCOUNTER — Other Ambulatory Visit: Payer: Self-pay | Admitting: Family Medicine

## 2015-11-10 DIAGNOSIS — I25110 Atherosclerotic heart disease of native coronary artery with unstable angina pectoris: Secondary | ICD-10-CM | POA: Diagnosis not present

## 2015-11-10 DIAGNOSIS — E039 Hypothyroidism, unspecified: Secondary | ICD-10-CM | POA: Diagnosis not present

## 2015-11-11 LAB — LIPID PANEL
Chol/HDL Ratio: 5.5 ratio units — ABNORMAL HIGH (ref 0.0–5.0)
Cholesterol, Total: 153 mg/dL (ref 100–199)
HDL: 28 mg/dL — ABNORMAL LOW (ref >39)
LDL Calculated: 69 mg/dL (ref 0–99)
Triglycerides: 282 mg/dL — ABNORMAL HIGH (ref 0–149)
VLDL Cholesterol Cal: 56 mg/dL — ABNORMAL HIGH (ref 5–40)

## 2015-11-11 LAB — TSH: TSH: 4.08 u[IU]/mL (ref 0.450–4.500)

## 2015-11-14 DIAGNOSIS — I5032 Chronic diastolic (congestive) heart failure: Secondary | ICD-10-CM | POA: Diagnosis not present

## 2015-11-14 DIAGNOSIS — J45909 Unspecified asthma, uncomplicated: Secondary | ICD-10-CM | POA: Diagnosis not present

## 2015-11-14 DIAGNOSIS — I5022 Chronic systolic (congestive) heart failure: Secondary | ICD-10-CM | POA: Diagnosis not present

## 2015-11-15 ENCOUNTER — Telehealth: Payer: Self-pay | Admitting: Family Medicine

## 2015-11-15 ENCOUNTER — Telehealth: Payer: Self-pay

## 2015-11-15 ENCOUNTER — Encounter: Payer: Self-pay | Admitting: Internal Medicine

## 2015-11-15 ENCOUNTER — Ambulatory Visit (INDEPENDENT_AMBULATORY_CARE_PROVIDER_SITE_OTHER): Payer: PPO | Admitting: Internal Medicine

## 2015-11-15 VITALS — BP 124/80 | HR 85 | Ht 68.0 in | Wt 291.8 lb

## 2015-11-15 DIAGNOSIS — J9611 Chronic respiratory failure with hypoxia: Secondary | ICD-10-CM | POA: Diagnosis not present

## 2015-11-15 NOTE — Telephone Encounter (Signed)
Pt advised.   Thanks,   -Gladie Gravette  

## 2015-11-15 NOTE — Progress Notes (Signed)
   Subjective:    Patient ID: Jacob Herrera, male    DOB: [birth year], 65 y.o.   MRN: [id number]  Synopsis: Jacob Herrera first saw the [facility] pulmonary clinic in February 2015 for evaluation of shortness of breath. He had a past medical history significant for asthma. Pulmonary function testing was normal with the exception of restriction secondary to obesity. He was started on a combination long acting bronchodilator/inhaled corticosteroid inhaler and he noted significant benefit from this.   CC: follow up SOB  HPI  H/o  acute CHF with CAD s/p cath Patient states that his SOB has improved, still with lower ext swelling on lasix is on IC/LABA,(symbicort)has chronic SOB/DOE Tried incruse but did not seem to help  No signs of infection at this time  Patient is compliant with biPAP at this time, has been on oxygen since discharge Patient morbidly obese  Patient does NOT smoke at this time  30 day Compliance Report:  97% compliance Leak 107 L/Min AHI 10  10/2015  100% compliance Leak 117.4L/min AHI 12.9  Review of Systems  Constitutional: Negative for chills, fatigue and fever.  HENT: Negative for postnasal drip.   Respiratory: Negative for chest tightness, shortness of breath and wheezing.   Cardiovascular: Positive for leg swelling. Negative for chest pain and palpitations.  Musculoskeletal: Positive for back pain and joint swelling.  All other systems reviewed and are negative.      Objective:   Physical Exam Vitals:   11/15/15 1331  BP: 124/80  BP Location: Left Arm  Cuff Size: Normal  Pulse: 85  SpO2: 92%  Weight: 291 lb 12.8 oz (132.4 kg)  Height: 5\' 8"  (1.727 m)  RA  Gen: obese, no acute distress HENT:  neck supple PULM: CTA B, normal percussion CV: RRR, no mgr,  notable leg edema GI: BS+, soft, nontender Derm: no cyanosis or rash Psyche: normal mood and affect   05/2013 Full FPT > ratio 82%, FEV1 1.95L (67% pred), no change with BD, TLC  3.91 L (64% pred), ERV 0.26L (18% pred), DLCO 15.6 (54% pred)     Assessment & Plan:   65 yo morbidly bese white male with  OSA with cardiomyopathy with s/p stent with Reactive airways disease and chronic kidney disease  With chronic hypoxic resp failure Overall, patient with very poor prognosis. Patient with multiorgan dysfunction.   1.continue biPAP as prescribed, will need to adjust mask to to decrease leaks, referral made-but patient does NOT want to drive to assess mask 2.albuterol as needed 3.continue symbicort, will stop Incruse 4.follow up cardiology as needed 5.follow up nephrology as needed   The Patient requires high complexity decision making for assessment and support, frequent evaluation and titration of therapies. Patient satisfied with Plan of action and management. All questions answered  Corrin Parker, M.D.  [facility]  Medical Director [place] Director [facility] Cardio-Pulmonary Department

## 2015-11-15 NOTE — Patient Instructions (Addendum)
Continue Symbicort Stop Incruse  Sleep Apnea  Sleep apnea is a sleep disorder characterized by abnormal pauses in breathing while you sleep. When your breathing pauses, the level of oxygen in your blood decreases. This causes you to move out of deep sleep and into light sleep. As a result, your quality of sleep is poor, and the system that carries your blood throughout your body (cardiovascular system) experiences stress. If sleep apnea remains untreated, the following conditions can develop:  High blood pressure (hypertension).  Coronary artery disease.  Inability to achieve or maintain an erection (impotence).  Impairment of your thought process (cognitive dysfunction). There are three types of sleep apnea: 1. Obstructive sleep apnea--Pauses in breathing during sleep because of a blocked airway. 2. Central sleep apnea--Pauses in breathing during sleep because the area of the brain that controls your breathing does not send the correct signals to the muscles that control breathing. 3. Mixed sleep apnea--A combination of both obstructive and central sleep apnea. RISK FACTORS The following risk factors can increase your risk of developing sleep apnea:  Being overweight.  Smoking.  Having narrow passages in your nose and throat.  Being of older age.  Being male.  Alcohol use.  Sedative and tranquilizer use.  Ethnicity. Among individuals younger than 35 years, African Americans are at increased risk of sleep apnea. SYMPTOMS   Difficulty staying asleep.  Daytime sleepiness and fatigue.  Loss of energy.  Irritability.  Loud, heavy snoring.  Morning headaches.  Trouble concentrating.  Forgetfulness.  Decreased interest in sex.  Unexplained sleepiness. DIAGNOSIS  In order to diagnose sleep apnea, your caregiver will perform a physical examination. A sleep study done in the comfort of your own home may be appropriate if you are otherwise healthy. Your caregiver may  also recommend that you spend the night in a sleep lab. In the sleep lab, several monitors record information about your heart, lungs, and brain while you sleep. Your leg and arm movements and blood oxygen level are also recorded. TREATMENT The following actions may help to resolve mild sleep apnea:  Sleeping on your side.   Using a decongestant if you have nasal congestion.   Avoiding the use of depressants, including alcohol, sedatives, and narcotics.   Losing weight and modifying your diet if you are overweight. There also are devices and treatments to help open your airway:  Oral appliances. These are custom-made mouthpieces that shift your lower jaw forward and slightly open your bite. This opens your airway.  Devices that create positive airway pressure. This positive pressure "splints" your airway open to help you breathe better during sleep. The following devices create positive airway pressure:  Continuous positive airway pressure (CPAP) device. The CPAP device creates a continuous level of air pressure with an air pump. The air is delivered to your airway through a mask while you sleep. This continuous pressure keeps your airway open.  Nasal expiratory positive airway pressure (EPAP) device. The EPAP device creates positive air pressure as you exhale. The device consists of single-use valves, which are inserted into each nostril and held in place by adhesive. The valves create very little resistance when you inhale but create much more resistance when you exhale. That increased resistance creates the positive airway pressure. This positive pressure while you exhale keeps your airway open, making it easier to breath when you inhale again.  Bilevel positive airway pressure (BPAP) device. The BPAP device is used mainly in patients with central sleep apnea. This device is similar  to the CPAP device because it also uses an air pump to deliver continuous air pressure through a mask.  However, with the BPAP machine, the pressure is set at two different levels. The pressure when you exhale is lower than the pressure when you inhale.  Surgery. Typically, surgery is only done if you cannot comply with less invasive treatments or if the less invasive treatments do not improve your condition. Surgery involves removing excess tissue in your airway to create a wider passage way.   This information is not intended to replace advice given to you by your health care provider. Make sure you discuss any questions you have with your health care provider.   Document Released: 03/31/2002 Document Revised: 05/01/2014 Document Reviewed: 08/17/2011 Elsevier Interactive Patient Education [employer].

## 2015-11-15 NOTE — Telephone Encounter (Signed)
Jacob Herrera had Cologuard ordered in May, but he never heard anything from company. Can you check and see if this was every ordered. Thanks.

## 2015-11-15 NOTE — Telephone Encounter (Signed)
-----   Message from [name], MD sent at 11/15/2015  1:43 PM EDT ----- Cholesterol is very good at 153. Thyroid functions normal. Continue current medications.  Follow up in November as scheduled.

## 2015-11-26 ENCOUNTER — Other Ambulatory Visit: Payer: Self-pay | Admitting: Family Medicine

## 2015-11-26 DIAGNOSIS — E1121 Type 2 diabetes mellitus with diabetic nephropathy: Secondary | ICD-10-CM

## 2015-12-01 DIAGNOSIS — I5022 Chronic systolic (congestive) heart failure: Secondary | ICD-10-CM | POA: Diagnosis not present

## 2015-12-02 NOTE — Telephone Encounter (Signed)
Order for cologuard faxed to [facility] °

## 2015-12-03 ENCOUNTER — Other Ambulatory Visit: Payer: Self-pay | Admitting: Family Medicine

## 2015-12-05 DIAGNOSIS — I5022 Chronic systolic (congestive) heart failure: Secondary | ICD-10-CM | POA: Diagnosis not present

## 2015-12-05 DIAGNOSIS — J45909 Unspecified asthma, uncomplicated: Secondary | ICD-10-CM | POA: Diagnosis not present

## 2015-12-05 DIAGNOSIS — I5032 Chronic diastolic (congestive) heart failure: Secondary | ICD-10-CM | POA: Diagnosis not present

## 2015-12-05 DIAGNOSIS — G4733 Obstructive sleep apnea (adult) (pediatric): Secondary | ICD-10-CM | POA: Diagnosis not present

## 2015-12-15 DIAGNOSIS — I5022 Chronic systolic (congestive) heart failure: Secondary | ICD-10-CM | POA: Diagnosis not present

## 2015-12-15 DIAGNOSIS — I5032 Chronic diastolic (congestive) heart failure: Secondary | ICD-10-CM | POA: Diagnosis not present

## 2015-12-15 DIAGNOSIS — J45909 Unspecified asthma, uncomplicated: Secondary | ICD-10-CM | POA: Diagnosis not present

## 2015-12-24 ENCOUNTER — Other Ambulatory Visit: Payer: Self-pay | Admitting: Family Medicine

## 2015-12-24 DIAGNOSIS — E1121 Type 2 diabetes mellitus with diabetic nephropathy: Secondary | ICD-10-CM

## 2016-01-01 DIAGNOSIS — I5022 Chronic systolic (congestive) heart failure: Secondary | ICD-10-CM | POA: Diagnosis not present

## 2016-01-05 DIAGNOSIS — G4733 Obstructive sleep apnea (adult) (pediatric): Secondary | ICD-10-CM | POA: Diagnosis not present

## 2016-01-05 DIAGNOSIS — I5022 Chronic systolic (congestive) heart failure: Secondary | ICD-10-CM | POA: Diagnosis not present

## 2016-01-05 DIAGNOSIS — I5032 Chronic diastolic (congestive) heart failure: Secondary | ICD-10-CM | POA: Diagnosis not present

## 2016-01-05 DIAGNOSIS — J45909 Unspecified asthma, uncomplicated: Secondary | ICD-10-CM | POA: Diagnosis not present

## 2016-01-11 DIAGNOSIS — E119 Type 2 diabetes mellitus without complications: Secondary | ICD-10-CM | POA: Diagnosis not present

## 2016-01-12 DIAGNOSIS — I10 Essential (primary) hypertension: Secondary | ICD-10-CM | POA: Diagnosis not present

## 2016-01-12 DIAGNOSIS — I5032 Chronic diastolic (congestive) heart failure: Secondary | ICD-10-CM | POA: Diagnosis not present

## 2016-01-12 DIAGNOSIS — E782 Mixed hyperlipidemia: Secondary | ICD-10-CM | POA: Diagnosis not present

## 2016-01-12 DIAGNOSIS — I25110 Atherosclerotic heart disease of native coronary artery with unstable angina pectoris: Secondary | ICD-10-CM | POA: Diagnosis not present

## 2016-01-13 ENCOUNTER — Encounter: Payer: Self-pay | Admitting: Family Medicine

## 2016-01-13 DIAGNOSIS — N183 Chronic kidney disease, stage 3 (moderate): Secondary | ICD-10-CM | POA: Diagnosis not present

## 2016-01-13 DIAGNOSIS — I129 Hypertensive chronic kidney disease with stage 1 through stage 4 chronic kidney disease, or unspecified chronic kidney disease: Secondary | ICD-10-CM | POA: Diagnosis not present

## 2016-01-13 DIAGNOSIS — R809 Proteinuria, unspecified: Secondary | ICD-10-CM | POA: Diagnosis not present

## 2016-01-13 DIAGNOSIS — E1122 Type 2 diabetes mellitus with diabetic chronic kidney disease: Secondary | ICD-10-CM | POA: Diagnosis not present

## 2016-01-13 DIAGNOSIS — N2581 Secondary hyperparathyroidism of renal origin: Secondary | ICD-10-CM | POA: Diagnosis not present

## 2016-01-14 ENCOUNTER — Other Ambulatory Visit: Payer: Self-pay | Admitting: Family Medicine

## 2016-01-15 DIAGNOSIS — J45909 Unspecified asthma, uncomplicated: Secondary | ICD-10-CM | POA: Diagnosis not present

## 2016-01-15 DIAGNOSIS — I5032 Chronic diastolic (congestive) heart failure: Secondary | ICD-10-CM | POA: Diagnosis not present

## 2016-01-15 DIAGNOSIS — I5022 Chronic systolic (congestive) heart failure: Secondary | ICD-10-CM | POA: Diagnosis not present

## 2016-01-18 DIAGNOSIS — D0439 Carcinoma in situ of skin of other parts of face: Secondary | ICD-10-CM | POA: Diagnosis not present

## 2016-01-18 DIAGNOSIS — X32XXXA Exposure to sunlight, initial encounter: Secondary | ICD-10-CM | POA: Diagnosis not present

## 2016-01-18 DIAGNOSIS — B079 Viral wart, unspecified: Secondary | ICD-10-CM | POA: Diagnosis not present

## 2016-01-18 DIAGNOSIS — Z85820 Personal history of malignant melanoma of skin: Secondary | ICD-10-CM | POA: Diagnosis not present

## 2016-01-18 DIAGNOSIS — D485 Neoplasm of uncertain behavior of skin: Secondary | ICD-10-CM | POA: Diagnosis not present

## 2016-01-18 DIAGNOSIS — D2261 Melanocytic nevi of right upper limb, including shoulder: Secondary | ICD-10-CM | POA: Diagnosis not present

## 2016-01-18 DIAGNOSIS — C44311 Basal cell carcinoma of skin of nose: Secondary | ICD-10-CM | POA: Diagnosis not present

## 2016-01-18 DIAGNOSIS — L570 Actinic keratosis: Secondary | ICD-10-CM | POA: Diagnosis not present

## 2016-01-18 DIAGNOSIS — D0472 Carcinoma in situ of skin of left lower limb, including hip: Secondary | ICD-10-CM | POA: Diagnosis not present

## 2016-01-18 DIAGNOSIS — D225 Melanocytic nevi of trunk: Secondary | ICD-10-CM | POA: Diagnosis not present

## 2016-01-18 DIAGNOSIS — D2272 Melanocytic nevi of left lower limb, including hip: Secondary | ICD-10-CM | POA: Diagnosis not present

## 2016-01-24 DIAGNOSIS — D0472 Carcinoma in situ of skin of left lower limb, including hip: Secondary | ICD-10-CM | POA: Diagnosis not present

## 2016-01-26 DIAGNOSIS — C44311 Basal cell carcinoma of skin of nose: Secondary | ICD-10-CM | POA: Diagnosis not present

## 2016-01-26 DIAGNOSIS — Z85828 Personal history of other malignant neoplasm of skin: Secondary | ICD-10-CM | POA: Diagnosis not present

## 2016-01-26 DIAGNOSIS — C44321 Squamous cell carcinoma of skin of nose: Secondary | ICD-10-CM | POA: Diagnosis not present

## 2016-01-31 DIAGNOSIS — I5022 Chronic systolic (congestive) heart failure: Secondary | ICD-10-CM | POA: Diagnosis not present

## 2016-02-04 DIAGNOSIS — I5032 Chronic diastolic (congestive) heart failure: Secondary | ICD-10-CM | POA: Diagnosis not present

## 2016-02-04 DIAGNOSIS — I5022 Chronic systolic (congestive) heart failure: Secondary | ICD-10-CM | POA: Diagnosis not present

## 2016-02-04 DIAGNOSIS — G4733 Obstructive sleep apnea (adult) (pediatric): Secondary | ICD-10-CM | POA: Diagnosis not present

## 2016-02-04 DIAGNOSIS — J45909 Unspecified asthma, uncomplicated: Secondary | ICD-10-CM | POA: Diagnosis not present

## 2016-02-14 DIAGNOSIS — J45909 Unspecified asthma, uncomplicated: Secondary | ICD-10-CM | POA: Diagnosis not present

## 2016-02-14 DIAGNOSIS — I5022 Chronic systolic (congestive) heart failure: Secondary | ICD-10-CM | POA: Diagnosis not present

## 2016-02-14 DIAGNOSIS — I5032 Chronic diastolic (congestive) heart failure: Secondary | ICD-10-CM | POA: Diagnosis not present

## 2016-02-19 ENCOUNTER — Other Ambulatory Visit: Payer: Self-pay | Admitting: Family Medicine

## 2016-02-22 ENCOUNTER — Other Ambulatory Visit: Payer: Self-pay | Admitting: Family Medicine

## 2016-02-23 ENCOUNTER — Other Ambulatory Visit: Payer: Self-pay | Admitting: *Deleted

## 2016-02-23 MED ORDER — BUDESONIDE-FORMOTEROL FUMARATE 80-4.5 MCG/ACT IN AERO: 2.0000 | INHALATION_SPRAY | Freq: Two times a day (BID) | RESPIRATORY_TRACT | 2 refills | Status: DC

## 2016-03-02 DIAGNOSIS — I5022 Chronic systolic (congestive) heart failure: Secondary | ICD-10-CM | POA: Diagnosis not present

## 2016-03-06 DIAGNOSIS — I5022 Chronic systolic (congestive) heart failure: Secondary | ICD-10-CM | POA: Diagnosis not present

## 2016-03-06 DIAGNOSIS — G4733 Obstructive sleep apnea (adult) (pediatric): Secondary | ICD-10-CM | POA: Diagnosis not present

## 2016-03-06 DIAGNOSIS — J45909 Unspecified asthma, uncomplicated: Secondary | ICD-10-CM | POA: Diagnosis not present

## 2016-03-06 DIAGNOSIS — I5032 Chronic diastolic (congestive) heart failure: Secondary | ICD-10-CM | POA: Diagnosis not present

## 2016-03-07 ENCOUNTER — Encounter: Payer: Self-pay | Admitting: Family Medicine

## 2016-03-07 ENCOUNTER — Ambulatory Visit (INDEPENDENT_AMBULATORY_CARE_PROVIDER_SITE_OTHER): Payer: PPO | Admitting: Family Medicine

## 2016-03-07 VITALS — BP 122/70 | HR 92 | Temp 97.8°F | Resp 16 | Wt 282.0 lb

## 2016-03-07 DIAGNOSIS — G4733 Obstructive sleep apnea (adult) (pediatric): Secondary | ICD-10-CM | POA: Diagnosis not present

## 2016-03-07 DIAGNOSIS — Z23 Encounter for immunization: Secondary | ICD-10-CM | POA: Diagnosis not present

## 2016-03-07 DIAGNOSIS — N183 Chronic kidney disease, stage 3 unspecified: Secondary | ICD-10-CM

## 2016-03-07 DIAGNOSIS — E1121 Type 2 diabetes mellitus with diabetic nephropathy: Secondary | ICD-10-CM

## 2016-03-07 DIAGNOSIS — I25110 Atherosclerotic heart disease of native coronary artery with unstable angina pectoris: Secondary | ICD-10-CM

## 2016-03-07 LAB — POCT GLYCOSYLATED HEMOGLOBIN (HGB A1C)
Est. average glucose Bld gHb Est-mCnc: 143
Hemoglobin A1C: 6.6

## 2016-03-07 NOTE — Progress Notes (Signed)
Patient: Jacob Herrera Male    DOB: 1950/10/22   65 y.o.   MRN: [id number] Visit Date: 03/07/2016  Today's Provider: Lelon Huh, MD   Chief Complaint  Patient presents with  . Hypertension    follow up  . Diabetes    follow up  . Hypothyroidism    follow up  . Coronary Artery Disease    follow up   Subjective:    HPI  Hypertension, follow-up:  BP Readings from Last 3 Encounters:  11/15/15 124/80  11/09/15 130/80  09/10/15 (!) 142/60    He was last seen for hypertension 1 years ago.  BP at that visit was 124/88. Management since that visit includes no changes; mostly managed by Nephrolgy. He reports good compliance with treatment. He is not having side effects.  He is not exercising. He is adherent to low salt diet.   Outside blood pressures are not being checked. He is experiencing none.  Patient denies chest pain, chest pressure/discomfort, claudication, dyspnea, exertional chest pressure/discomfort, fatigue, irregular heart beat, lower extremity edema, near-syncope, orthopnea, palpitations, paroxysmal nocturnal dyspnea, syncope and tachypnea.   Cardiovascular risk factors include advanced age (older than 57 for men, 51 for women), diabetes mellitus, hypertension, male gender and sedentary lifestyle.  Use of agents associated with hypertension: NSAIDS.     Weight trend: decreasing steadily Wt Readings from Last 3 Encounters:  11/15/15 291 lb 12.8 oz (132.4 kg)  11/09/15 289 lb (131.1 kg)  09/10/15 300 lb (136.1 kg)    Current diet: well balanced  ------------------------------------------------------------------------ Follow up CAD: Patient was last seen 4 months ago and no changes were made. Patient reports good compliance with treatment. Denies any chest pains or worsening shortness of breath. Has follow up with Dr. Ubaldo Glassing every 6 months, last was 920/17   Diabetes Mellitus Type II, Follow-up:   Lab Results  Component Value Date   HGBA1C  6.7 11/09/2015   HGBA1C 8.1 08/09/2015   HGBA1C 9.3 05/12/2015    Last seen for diabetes 4 months ago.  Management since then includes no changes. He reports good compliance with treatment. He is not having side effects.  Current symptoms include polydipsia and polyuria and have been stable. Home blood sugar records: fasting range: 190-216  Episodes of hypoglycemia? no   Current Insulin Regimen: Lantus 50-55 units daily and Humalog 35 units daily Most Recent Eye Exam: <1 year Weight trend: decreasing steadily Prior visit with dietician: no Current diet: well balanced Current exercise: none  Pertinent Labs:    Component Value Date/Time   CHOL CANCELED 11/10/2015 0909   TRIG CANCELED 11/10/2015 0909   HDL CANCELED 11/10/2015 0909   LDLCALC 69 11/10/2015 0000   CREATININE 1.38 (H) 04/01/2015 0906   CREATININE 1.36 (H) 07/08/2013 0555    Wt Readings from Last 3 Encounters:  11/15/15 291 lb 12.8 oz (132.4 kg)  11/09/15 289 lb (131.1 kg)  09/10/15 300 lb (136.1 kg)    ------------------------------------------------------------------------ Follow up Hypothyroidism: Patient was last seen 4 months ago and no changes were made. Patient reports good compliance with treatment and good tolerance.  Lab Results  Component Value Date   TSH 4.080 11/10/2015   Follow up sleep apnea He continues to use Bipap with oxygen every day and feels less fatigued and less sleepy.     No Known Allergies   Current Outpatient Prescriptions:  .  albuterol (PROVENTIL) (2.5 MG/3ML) 0.083% nebulizer solution, Take 3 mLs (2.5 mg total) by nebulization  every 4 (four) hours. And PRN, Disp: 75 mL, Rfl: 12 .  amLODipine (NORVASC) 10 MG tablet, Take 1 tablet (10 mg total) by mouth daily., Disp: 30 tablet, Rfl: 1 .  aspirin 81 MG tablet, Take 81 mg by mouth daily., Disp: , Rfl:  .  atorvastatin (LIPITOR) 40 MG tablet, TAKE 1 TABLET BY MOUTH AT BEDTIME, Disp: 90 tablet, Rfl: 4 .  BD PEN NEEDLE NANO U/F  32G X 4 MM MISC, USE DAILY AS DIRECTED, Disp: 100 each, Rfl: 4 .  budesonide-formoterol (SYMBICORT) 80-4.5 MCG/ACT inhaler, Inhale 2 puffs into the lungs 2 (two) times daily., Disp: 3 Inhaler, Rfl: 2 .  carvedilol (COREG) 12.5 MG tablet, Take 12.5 mg by mouth 2 (two) times daily with a meal., Disp: , Rfl:  .  clopidogrel (PLAVIX) 75 MG tablet, Take 1 tablet (75 mg total) by mouth daily., Disp: 30 tablet, Rfl: 11 .  Exenatide ER (BYDUREON) 2 MG PEN, Inject 2 mg into the skin once a week., Disp: 4 each, Rfl: 12 .  fluticasone (FLONASE) 50 MCG/ACT nasal spray, Place 2 sprays into both nostrils daily., Disp: 1 g, Rfl: 2 .  furosemide (LASIX) 40 MG tablet, Take 40 mg by mouth 2 (two) times daily. , Disp: , Rfl:  .  glipiZIDE (GLUCOTROL) 10 MG tablet, Take 1 tablet (10 mg total) by mouth daily before breakfast., Disp: 90 tablet, Rfl: 4 .  glucose blood (ONE TOUCH ULTRA TEST) test strip, USE ONE STRIP THREE TIMES DAILY, Disp: 100 each, Rfl: 4 .  HUMALOG KWIKPEN 100 UNIT/ML KiwkPen, INJECT UP TO 12 UNITS UNITS SUBCUTANEOUS FOUR TIMES DAILY BEFORE MEALS, Disp: 15 mL, Rfl: 5 .  hydrALAZINE (APRESOLINE) 10 MG tablet, Take 10 mg by mouth 2 (two) times daily., Disp: , Rfl:  .  LANTUS SOLOSTAR 100 UNIT/ML Solostar Pen, TITRATE UP TO 80 UNITS, ONCE A DAY, Disp: 8 pen, Rfl: 12 .  levothyroxine (SYNTHROID, LEVOTHROID) 125 MCG tablet, TAKE 1 TABLET (125 MCG TOTAL) BY MOUTH DAILY., Disp: 30 tablet, Rfl: 6 .  losartan (COZAAR) 100 MG tablet, Take 1 tablet (100 mg total) by mouth daily., Disp: 30 tablet, Rfl: 1 .  Lysine 500 MG CAPS, Take 1 capsule by mouth daily., Disp: , Rfl:  .  omeprazole (PRILOSEC) 40 MG capsule, TAKE 1 CAPSULE BY MOUTH DAILY, Disp: 30 capsule, Rfl: 12 .  oxybutynin (DITROPAN) 5 MG tablet, Take 1 tablet (5 mg total) by mouth 3 (three) times daily. (Patient taking differently: Take 5 mg by mouth daily. ), Disp: 30 tablet, Rfl: 1 .  oxyCODONE (OXY IR/ROXICODONE) 5 MG immediate release tablet, Take 1  tablet (5 mg total) by mouth daily as needed for severe pain., Disp: 30 tablet, Rfl: 0 .  pantoprazole (PROTONIX) 40 MG tablet, Take 1 tablet (40 mg total) by mouth daily before breakfast., Disp: 30 tablet, Rfl: 3 .  sodium bicarbonate 650 MG tablet, Take 650 mg by mouth 2 (two) times daily., Disp: , Rfl:  .  spironolactone (ALDACTONE) 25 MG tablet, Take 25 mg by mouth daily., Disp: , Rfl:  .  tamsulosin (FLOMAX) 0.4 MG CAPS capsule, Take 1 capsule (0.4 mg total) by mouth daily. (Patient taking differently: Take 0.4 mg by mouth 2 (two) times daily. ), Disp: 30 capsule, Rfl: 3  Review of Systems  Constitutional: Negative for appetite change, chills and fever.  Respiratory: Negative for chest tightness, shortness of breath and wheezing.   Cardiovascular: Negative for chest pain and palpitations.  Gastrointestinal: Negative for  abdominal pain, nausea and vomiting.  Endocrine: Positive for polyphagia and polyuria. Negative for cold intolerance, heat intolerance and polydipsia.  Neurological: Positive for light-headedness.    Social History  Substance Use Topics  . Smoking status: Former Smoker    Packs/day: 1.00    Years: 25.00    Types: Cigarettes    Quit date: 05/29/1988  . Smokeless tobacco: Current User    Types: Chew     Comment: chews 1 bag of loose leaf chew/week  . Alcohol use No   Objective:   BP 122/70 (BP Location: Right Arm, Patient Position: Sitting, Cuff Size: Large)   Pulse 92   Temp 97.8 F (36.6 C) (Oral)   Resp 16   Wt 282 lb (127.9 kg)   SpO2 97% Comment: room air  BMI 42.88 kg/m   Physical Exam  General Appearance:    Alert, cooperative, no distress, obese  Eyes:    PERRL, conjunctiva/corneas clear, EOM's intact       Lungs:     Clear to auscultation bilaterally, respirations unlabored  Heart:    Regular rate and rhythm  Neurologic:   Awake, alert, oriented x 3. No apparent focal neurological           defect.        Results for orders placed or performed  in visit on 03/07/16  POCT HgB A1C  Result Value Ref Range   Hemoglobin A1C 6.6    Est. average glucose Bld gHb Est-mCnc 143        Assessment & Plan:     1. Diabetes mellitus with nephropathy (HCC) A1c is well controlled. Fastings have been running high lately and advised he may adjust Lantus up by 2 units a day when fastings are over 200.  - POCT HgB A1C  2. Coronary artery disease involving native coronary artery of native heart with unstable angina pectoris (HCC) Asymptomatic. Compliant with medication.  Continue aggressive risk factor modification.    3. OSA treated with BiPAP Doing well on Bipap with O2  4. Chronic kidney disease (CKD), stage III (moderate) Stable, continue regular follow up with nephrology.   5. Need for pneumococcal vaccination Pneumovax-23 today.   6. Need for influenza vaccination  - Flu vaccine HIGH DOSE PF (Fluzone High dose)  Return in about 3 months (around 06/12/2016).     The entirety of the information documented in the History of Present Illness, Review of Systems and Physical Exam were personally obtained by me. Portions of this information were initially documented by Meyer Cory, CMA and reviewed by me for thoroughness and accuracy.    Lelon Huh, MD  [facility]

## 2016-03-16 ENCOUNTER — Other Ambulatory Visit: Payer: Self-pay | Admitting: Family Medicine

## 2016-03-16 DIAGNOSIS — J45909 Unspecified asthma, uncomplicated: Secondary | ICD-10-CM | POA: Diagnosis not present

## 2016-03-16 DIAGNOSIS — I5022 Chronic systolic (congestive) heart failure: Secondary | ICD-10-CM | POA: Diagnosis not present

## 2016-03-16 DIAGNOSIS — I5032 Chronic diastolic (congestive) heart failure: Secondary | ICD-10-CM | POA: Diagnosis not present

## 2016-03-22 ENCOUNTER — Telehealth: Payer: Self-pay | Admitting: Family Medicine

## 2016-04-01 DIAGNOSIS — I5022 Chronic systolic (congestive) heart failure: Secondary | ICD-10-CM | POA: Diagnosis not present

## 2016-04-05 DIAGNOSIS — I5032 Chronic diastolic (congestive) heart failure: Secondary | ICD-10-CM | POA: Diagnosis not present

## 2016-04-05 DIAGNOSIS — J45909 Unspecified asthma, uncomplicated: Secondary | ICD-10-CM | POA: Diagnosis not present

## 2016-04-05 DIAGNOSIS — I5022 Chronic systolic (congestive) heart failure: Secondary | ICD-10-CM | POA: Diagnosis not present

## 2016-04-05 DIAGNOSIS — G4733 Obstructive sleep apnea (adult) (pediatric): Secondary | ICD-10-CM | POA: Diagnosis not present

## 2016-04-13 DIAGNOSIS — E875 Hyperkalemia: Secondary | ICD-10-CM | POA: Diagnosis not present

## 2016-04-13 DIAGNOSIS — I129 Hypertensive chronic kidney disease with stage 1 through stage 4 chronic kidney disease, or unspecified chronic kidney disease: Secondary | ICD-10-CM | POA: Diagnosis not present

## 2016-04-13 DIAGNOSIS — R809 Proteinuria, unspecified: Secondary | ICD-10-CM | POA: Diagnosis not present

## 2016-04-13 DIAGNOSIS — N184 Chronic kidney disease, stage 4 (severe): Secondary | ICD-10-CM | POA: Diagnosis not present

## 2016-04-13 DIAGNOSIS — E1122 Type 2 diabetes mellitus with diabetic chronic kidney disease: Secondary | ICD-10-CM | POA: Diagnosis not present

## 2016-04-13 DIAGNOSIS — E872 Acidosis: Secondary | ICD-10-CM | POA: Diagnosis not present

## 2016-04-13 DIAGNOSIS — N2581 Secondary hyperparathyroidism of renal origin: Secondary | ICD-10-CM | POA: Diagnosis not present

## 2016-04-15 DIAGNOSIS — J45909 Unspecified asthma, uncomplicated: Secondary | ICD-10-CM | POA: Diagnosis not present

## 2016-04-15 DIAGNOSIS — I5032 Chronic diastolic (congestive) heart failure: Secondary | ICD-10-CM | POA: Diagnosis not present

## 2016-04-15 DIAGNOSIS — I5022 Chronic systolic (congestive) heart failure: Secondary | ICD-10-CM | POA: Diagnosis not present

## 2016-04-18 DIAGNOSIS — I5032 Chronic diastolic (congestive) heart failure: Secondary | ICD-10-CM | POA: Diagnosis not present

## 2016-04-18 DIAGNOSIS — G4733 Obstructive sleep apnea (adult) (pediatric): Secondary | ICD-10-CM | POA: Diagnosis not present

## 2016-04-18 DIAGNOSIS — J45909 Unspecified asthma, uncomplicated: Secondary | ICD-10-CM | POA: Diagnosis not present

## 2016-04-18 DIAGNOSIS — I5022 Chronic systolic (congestive) heart failure: Secondary | ICD-10-CM | POA: Diagnosis not present

## 2016-04-19 DIAGNOSIS — G5601 Carpal tunnel syndrome, right upper limb: Secondary | ICD-10-CM | POA: Diagnosis not present

## 2016-04-25 ENCOUNTER — Telehealth: Payer: Self-pay | Admitting: Family Medicine

## 2016-04-25 NOTE — Telephone Encounter (Signed)
Called Pt to schedule AWV with [facility] for 2/15 @8 :45am move f/up W Dr. Caryn Section to 9:30am- knb

## 2016-04-26 ENCOUNTER — Ambulatory Visit: Payer: PPO | Admitting: Urology

## 2016-04-26 VITALS — Ht 68.0 in | Wt 285.1 lb

## 2016-04-26 DIAGNOSIS — N3281 Overactive bladder: Secondary | ICD-10-CM | POA: Diagnosis not present

## 2016-04-26 DIAGNOSIS — N400 Enlarged prostate without lower urinary tract symptoms: Secondary | ICD-10-CM | POA: Diagnosis not present

## 2016-04-26 MED ORDER — FINASTERIDE 5 MG PO TABS: 5.0000 mg | ORAL_TABLET | Freq: Every day | ORAL | 11 refills | Status: DC

## 2016-04-26 NOTE — Telephone Encounter (Signed)
Patient came into the office and stated that he did not want to change his appointment with Dr. Caryn Section and does not want to schedule an appointment with [facility] for AWE.  Patient stated that he had been contacted about this before and he told them that he wasn't interested.  He said if he decides to do this he will discuss it with Dr. Caryn Section and make the appointment himself.  Patient request that he be removed from your list.

## 2016-04-26 NOTE — Progress Notes (Signed)
04/26/2016 2:36 PM   Jacob Herrera 29-Nov-1950 [id number]  Referring provider: [name], MD [address] [place], [place] [address]  Chief Complaint  Patient presents with  . New Patient (Initial Visit)    BPH    HPI: The patient is a 66 year old male with multiple medical co-morbiditides including CKD stage IV who presents today for evaluation of BPH of flomax 0.8 mg and ditropan.  His biggest complaint at this time is his weak urinary stream. He feels a cystoscopy struggle to urinate and that his urine sometimes only has a very weak stream. He does have urinary urgency with occasional urge incontinence but he does not find this bothersome. This is the reason he is on Ditropan. He feels that he empties his bladder. He does also notes daytime urinary frequency. Again this is not as bothersome as his weak stream and having to strain to urinate.  He also has sleep apnea and is on a CPAP machine. This also helps with his nocturia.   PMH: Past Medical History:  Diagnosis Date  . History of chicken pox   . Hx of skin cancer, basal cell   . Kidney disease   . Scoliosis   . Thyrotoxicosis    2010    Surgical History: Past Surgical History:  Procedure Laterality Date  . CARDIAC CATHETERIZATION N/A 03/31/2015   Procedure: Left Heart Cath and Coronary Angiography;  Surgeon: Teodoro Spray, MD;  Location: [facility];  Service: Cardiovascular;  Laterality: N/A;  . CARDIAC CATHETERIZATION N/A 03/31/2015   Procedure: Coronary Stent Intervention;  Surgeon: Teodoro Spray, MD;  Location: [facility];  Service: Cardiovascular;  Laterality: N/A;  . CARDIAC CATHETERIZATION N/A 03/31/2015   Procedure: Coronary Stent Intervention;  Surgeon: Yolonda Kida, MD;  Location: [facility];  Service: Cardiovascular;  Laterality: N/A;  . NASAL SINUS SURGERY  2005  . TONSILLECTOMY AND ADENOIDECTOMY  1950    Home Medications:  Allergies as of  04/26/2016   No Known Allergies     Medication List       Accurate as of 04/26/16  2:36 PM. Always use your most recent med list.          albuterol (2.5 MG/3ML) 0.083% nebulizer solution Commonly known as:  PROVENTIL Take 3 mLs (2.5 mg total) by nebulization every 4 (four) hours. And PRN   amLODipine 10 MG tablet Commonly known as:  NORVASC Take 1 tablet (10 mg total) by mouth daily.   aspirin 81 MG tablet Take 81 mg by mouth daily.   atorvastatin 40 MG tablet Commonly known as:  LIPITOR TAKE 1 TABLET BY MOUTH AT BEDTIME   BD PEN NEEDLE NANO U/F 32G X 4 MM Misc Generic drug:  Insulin Pen Needle USE DAILY AS DIRECTED   budesonide-formoterol 80-4.5 MCG/ACT inhaler Commonly known as:  SYMBICORT Inhale 2 puffs into the lungs 2 (two) times daily.   carvedilol 12.5 MG tablet Commonly known as:  COREG Take 12.5 mg by mouth 2 (two) times daily with a meal.   clopidogrel 75 MG tablet Commonly known as:  PLAVIX Take 1 tablet (75 mg total) by mouth daily.   Exenatide ER 2 MG Pen Commonly known as:  BYDUREON Inject 2 mg into the skin once a week.   finasteride 5 MG tablet Commonly known as:  PROSCAR Take 1 tablet (5 mg total) by mouth daily.   fluticasone 50 MCG/ACT nasal spray Commonly known as:  FLONASE Place 2 sprays into both nostrils daily.   furosemide 40 MG tablet Commonly known as:  LASIX Take 40 mg by mouth 2 (two) times daily.   glipiZIDE 10 MG tablet Commonly known as:  GLUCOTROL Take 1 tablet (10 mg total) by mouth daily before breakfast.   HUMALOG KWIKPEN 100 UNIT/ML KiwkPen Generic drug:  insulin lispro INJECT UP TO 12 UNITS UNITS SUBCUTANEOUS FOUR TIMES DAILY BEFORE MEALS   hydrALAZINE 10 MG tablet Commonly known as:  APRESOLINE Take 10 mg by mouth 2 (two) times daily.   LANTUS SOLOSTAR 100 UNIT/ML Solostar Pen Generic drug:  Insulin Glargine TITRATE UP TO 80 UNITS, ONCE A DAY   levothyroxine 125 MCG tablet Commonly known as:  SYNTHROID,  LEVOTHROID TAKE 1 TABLET (125 MCG TOTAL) BY MOUTH DAILY.   losartan 100 MG tablet Commonly known as:  COZAAR Take 1 tablet (100 mg total) by mouth daily.   Lysine 500 MG Caps Take 1 capsule by mouth daily.   omeprazole 40 MG capsule Commonly known as:  PRILOSEC TAKE 1 CAPSULE BY MOUTH DAILY   ONE TOUCH ULTRA TEST test strip Generic drug:  glucose blood USE ONE STRIP THREE TIMES DAILY   oxybutynin 5 MG tablet Commonly known as:  DITROPAN Take 1 tablet (5 mg total) by mouth 3 (three) times daily.   oxyCODONE 5 MG immediate release tablet Commonly known as:  Oxy IR/ROXICODONE Take 1 tablet (5 mg total) by mouth daily as needed for severe pain.   pantoprazole 40 MG tablet Commonly known as:  PROTONIX Take 1 tablet (40 mg total) by mouth daily before breakfast.   sodium bicarbonate 650 MG tablet Take 650 mg by mouth 2 (two) times daily.   spironolactone 25 MG tablet Commonly known as:  ALDACTONE Take 25 mg by mouth daily.   tamsulosin 0.4 MG Caps capsule Commonly known as:  FLOMAX Take 1 capsule (0.4 mg total) by mouth daily.       Allergies: No Known Allergies  Family History: Family History  Problem Relation Age of Onset  . Emphysema Father   . Cancer - Lung Father   . Cancer - Ovarian Mother     Social History:  reports that he quit smoking about 27 years ago. His smoking use included Cigarettes. He has a 25.00 pack-year smoking history. His smokeless tobacco use includes Chew. He reports that he does not drink alcohol or use drugs.  ROS: UROLOGY Frequent Urination?: Yes Hard to postpone urination?: Yes Burning/pain with urination?: No Get up at night to urinate?: Yes Leakage of urine?: Yes Urine stream starts and stops?: Yes Trouble starting stream?: No Do you have to strain to urinate?: Yes Blood in urine?: Yes Urinary tract infection?: No Sexually transmitted disease?: No Injury to kidneys or bladder?: Yes Painful intercourse?: No Weak stream?:  No Erection problems?: Yes Penile pain?: No  Gastrointestinal Nausea?: No Vomiting?: No Indigestion/heartburn?: No Diarrhea?: No Constipation?: No  Constitutional Fever: No                                Physical Exam: Ht 5\' 8"  (1.727 m)   Wt 285 lb 1.6 oz (129.3 kg)   BMI 43.35 kg/m   Constitutional:  Alert and oriented, No acute distress. HEENT: [place] AT, moist mucus membranes.  Trachea midline, no masses. Cardiovascular: No clubbing, cyanosis, or edema. Respiratory: Normal respiratory effort, no increased work of breathing. GI: Abdomen is soft, nontender,  nondistended, no abdominal masses GU: No CVA tenderness. DRE: 2+ smooth benign. Skin: No rashes, bruises or suspicious lesions. Lymph: No cervical or inguinal adenopathy. Neurologic: Grossly intact, no focal deficits, moving all 4 extremities. Psychiatric: Normal mood and affect.  Laboratory Data: Lab Results  Component Value Date   WBC 6.6 11/09/2014   HGB 13.5 07/08/2013   HCT 38.9 11/09/2014   MCV 86 11/09/2014   PLT 241 11/09/2014    Lab Results  Component Value Date   CREATININE 1.38 (H) 04/01/2015    No results found for: PSA  No results found for: TESTOSTERONE  Lab Results  Component Value Date   HGBA1C 6.6 03/07/2016    Urinalysis    Component Value Date/Time   COLORURINE Yellow 07/04/2013 1226   APPEARANCEUR Hazy 07/04/2013 1226   LABSPEC 1.020 07/04/2013 1226   PHURINE 5.0 07/04/2013 1226   GLUCOSEU 50 mg/dL 07/04/2013 1226   HGBUR Negative 07/04/2013 1226   BILIRUBINUR Negative 07/04/2013 1226   KETONESUR Negative 07/04/2013 1226   PROTEINUR >=500 07/04/2013 1226   NITRITE Negative 07/04/2013 1226   LEUKOCYTESUR Negative 07/04/2013 1226    Assessment & Plan:    1. BPH 2. Urinary urgency with incontinence The patient seems to be more bothered by his obstructive voiding symptoms rather than his urinary urgency with urge incontinence. As is his main concern, I will  have him stop his oxybutynin for now to see if this improves his weak stream and having to strain to urinate. He'll continue his Flomax 0 point milligrams daily. I will also add finasteride to his regimen. He was informed that finasteride takes at least 6 weeks to have any noticeable effect in 6 months to have its full effect. He'll follow-up in a few months to assess his progress.  Return in about 3 months (around 07/25/2016).  Nickie Retort, MD  [facility] [address] [place], [place] [address] [phone]

## 2016-05-02 DIAGNOSIS — I5022 Chronic systolic (congestive) heart failure: Secondary | ICD-10-CM | POA: Diagnosis not present

## 2016-05-04 DIAGNOSIS — D485 Neoplasm of uncertain behavior of skin: Secondary | ICD-10-CM | POA: Diagnosis not present

## 2016-05-04 DIAGNOSIS — L820 Inflamed seborrheic keratosis: Secondary | ICD-10-CM | POA: Diagnosis not present

## 2016-05-04 DIAGNOSIS — L538 Other specified erythematous conditions: Secondary | ICD-10-CM | POA: Diagnosis not present

## 2016-05-06 DIAGNOSIS — I5032 Chronic diastolic (congestive) heart failure: Secondary | ICD-10-CM | POA: Diagnosis not present

## 2016-05-06 DIAGNOSIS — J45909 Unspecified asthma, uncomplicated: Secondary | ICD-10-CM | POA: Diagnosis not present

## 2016-05-06 DIAGNOSIS — I5022 Chronic systolic (congestive) heart failure: Secondary | ICD-10-CM | POA: Diagnosis not present

## 2016-05-06 DIAGNOSIS — G4733 Obstructive sleep apnea (adult) (pediatric): Secondary | ICD-10-CM | POA: Diagnosis not present

## 2016-05-08 ENCOUNTER — Telehealth: Payer: Self-pay | Admitting: Family Medicine

## 2016-05-08 NOTE — Telephone Encounter (Signed)
Received form for this today, gave to provider.

## 2016-05-08 NOTE — Telephone Encounter (Signed)
Peggy with [employer] [facility] called for PA  Ref # [id number]  Exenatide ER (BYDUREON) 2 MG PEN  Call back is [phone]  Thanks Con Memos

## 2016-05-25 ENCOUNTER — Ambulatory Visit: Payer: PPO | Admitting: Internal Medicine

## 2016-06-02 DIAGNOSIS — I5022 Chronic systolic (congestive) heart failure: Secondary | ICD-10-CM | POA: Diagnosis not present

## 2016-06-06 DIAGNOSIS — I5022 Chronic systolic (congestive) heart failure: Secondary | ICD-10-CM | POA: Diagnosis not present

## 2016-06-06 DIAGNOSIS — G4733 Obstructive sleep apnea (adult) (pediatric): Secondary | ICD-10-CM | POA: Diagnosis not present

## 2016-06-06 DIAGNOSIS — I5032 Chronic diastolic (congestive) heart failure: Secondary | ICD-10-CM | POA: Diagnosis not present

## 2016-06-06 DIAGNOSIS — J45909 Unspecified asthma, uncomplicated: Secondary | ICD-10-CM | POA: Diagnosis not present

## 2016-06-08 ENCOUNTER — Encounter: Payer: Self-pay | Admitting: Family Medicine

## 2016-06-08 ENCOUNTER — Ambulatory Visit (INDEPENDENT_AMBULATORY_CARE_PROVIDER_SITE_OTHER): Payer: PPO | Admitting: Family Medicine

## 2016-06-08 VITALS — BP 120/64 | HR 98 | Temp 98.3°F | Resp 18 | Ht 68.0 in | Wt 291.0 lb

## 2016-06-08 DIAGNOSIS — K5900 Constipation, unspecified: Secondary | ICD-10-CM

## 2016-06-08 DIAGNOSIS — E1121 Type 2 diabetes mellitus with diabetic nephropathy: Secondary | ICD-10-CM

## 2016-06-08 LAB — POCT GLYCOSYLATED HEMOGLOBIN (HGB A1C)
Est. average glucose Bld gHb Est-mCnc: 146
Hemoglobin A1C: 6.7

## 2016-06-08 MED ORDER — GLIPIZIDE 5 MG PO TABS: 5.0000 mg | ORAL_TABLET | Freq: Every day | ORAL | 0 refills | Status: DC

## 2016-06-08 NOTE — Patient Instructions (Signed)
Try OTC Fleet's enema for constipation

## 2016-06-08 NOTE — Progress Notes (Signed)
Patient: Jacob Herrera Male    DOB: July 21, 1950   66 y.o.   MRN: [id number] Visit Date: 06/08/2016  Today's Provider: Lelon Huh, MD   Chief Complaint  Patient presents with  . Follow-up  . Diabetes  . Hypertension  . Coronary Artery Disease  . Sleep Apnea  . Chronic Kidney Disease   Subjective:    HPI  Coronary artery disease involving native coronary artery of native heart with unstable angina pectoris  From 03/07/16-no changes.  continues regular follow up Dr. Ubaldo Glassing. No chest pains.   OSA treated with BiPAP From 03/07/16-Doing well on Bipap with O2.  Using CPAP every night.   Chronic kidney disease (CKD), stage III (moderate) From 03/07/16-Stable, continue regular follow up with nephrology.    Diabetes Mellitus Type II, Follow-up:   Lab Results  Component Value Date   HGBA1C 6.7 06/08/2016   HGBA1C 6.6 03/07/2016   HGBA1C 6.7 11/09/2015   Last seen for diabetes 3 months ago.  Management since then includes; advised patient may adjust Lantus up by 2 units a day when fastings are over 200. He reports good compliance with treatment. He is not having side effects. none Current symptoms include none and have been unchanged. Home blood sugar records: fasting range: 190-280  Episodes of hypoglycemia? no   Current Insulin Regimen: 60 units Lantuss and 30 humalog before breakfast Most Recent Eye Exam: due Weight trend: stable Prior visit with dietician: no Current diet: well balanced Current exercise: none  ----------------------------------------------------------------   Hypertension, follow-up:  BP Readings from Last 3 Encounters:  06/08/16 120/64  03/07/16 122/70  11/15/15 124/80    He was last seen for hypertension 3 months ago.  BP at that visit was 124/88. Management since that visit includes; no changes.He reports good compliance with treatment. He is not having side effects. none He is not exercising. He is adherent to low salt  diet.   Outside blood pressures are not checking. He is experiencing none.  Patient denies none.   Cardiovascular risk factors include diabetes mellitus.  Use of agents associated with hypertension: none.   ----------------------------------------------------------------   No Known Allergies   Current Outpatient Prescriptions:  .  albuterol (PROVENTIL) (2.5 MG/3ML) 0.083% nebulizer solution, Take 3 mLs (2.5 mg total) by nebulization every 4 (four) hours. And PRN, Disp: 75 mL, Rfl: 12 .  amLODipine (NORVASC) 10 MG tablet, Take 1 tablet (10 mg total) by mouth daily., Disp: 30 tablet, Rfl: 1 .  aspirin 81 MG tablet, Take 81 mg by mouth daily., Disp: , Rfl:  .  atorvastatin (LIPITOR) 40 MG tablet, TAKE 1 TABLET BY MOUTH AT BEDTIME, Disp: 90 tablet, Rfl: 4 .  BD PEN NEEDLE NANO U/F 32G X 4 MM MISC, USE DAILY AS DIRECTED, Disp: 100 each, Rfl: 4 .  budesonide-formoterol (SYMBICORT) 80-4.5 MCG/ACT inhaler, Inhale 2 puffs into the lungs 2 (two) times daily., Disp: 3 Inhaler, Rfl: 2 .  carvedilol (COREG) 12.5 MG tablet, Take 12.5 mg by mouth 2 (two) times daily with a meal., Disp: , Rfl:  .  clopidogrel (PLAVIX) 75 MG tablet, Take 1 tablet (75 mg total) by mouth daily., Disp: 30 tablet, Rfl: 11 .  Exenatide ER (BYDUREON) 2 MG PEN, Inject 2 mg into the skin once a week., Disp: 4 each, Rfl: 12 .  finasteride (PROSCAR) 5 MG tablet, Take 1 tablet (5 mg total) by mouth daily., Disp: 30 tablet, Rfl: 11 .  fluticasone (FLONASE) 50 MCG/ACT nasal  spray, Place 2 sprays into both nostrils daily., Disp: 1 g, Rfl: 2 .  furosemide (LASIX) 40 MG tablet, Take 40 mg by mouth 2 (two) times daily. , Disp: , Rfl:  .  glipiZIDE (GLUCOTROL) 10 MG tablet, Take 1 tablet (10 mg total) by mouth daily before breakfast., Disp: 90 tablet, Rfl: 4 .  HUMALOG KWIKPEN 100 UNIT/ML KiwkPen, INJECT UP TO 12 UNITS UNITS SUBCUTANEOUS FOUR TIMES DAILY BEFORE MEALS, Disp: 15 mL, Rfl: 5 .  hydrALAZINE (APRESOLINE) 10 MG tablet, Take 10 mg  by mouth 2 (two) times daily., Disp: , Rfl:  .  LANTUS SOLOSTAR 100 UNIT/ML Solostar Pen, TITRATE UP TO 80 UNITS, ONCE A DAY, Disp: 8 pen, Rfl: 12 .  levothyroxine (SYNTHROID, LEVOTHROID) 125 MCG tablet, TAKE 1 TABLET (125 MCG TOTAL) BY MOUTH DAILY., Disp: 30 tablet, Rfl: 6 .  losartan (COZAAR) 100 MG tablet, Take 1 tablet (100 mg total) by mouth daily., Disp: 30 tablet, Rfl: 1 .  Lysine 500 MG CAPS, Take 1 capsule by mouth daily., Disp: , Rfl:  .  omeprazole (PRILOSEC) 40 MG capsule, TAKE 1 CAPSULE BY MOUTH DAILY, Disp: 30 capsule, Rfl: 12 .  ONE TOUCH ULTRA TEST test strip, USE ONE STRIP THREE TIMES DAILY, Disp: 100 each, Rfl: 12 .  oxyCODONE (OXY IR/ROXICODONE) 5 MG immediate release tablet, Take 1 tablet (5 mg total) by mouth daily as needed for severe pain., Disp: 30 tablet, Rfl: 0 .  pantoprazole (PROTONIX) 40 MG tablet, Take 1 tablet (40 mg total) by mouth daily before breakfast., Disp: 30 tablet, Rfl: 3 .  sodium bicarbonate 650 MG tablet, Take 650 mg by mouth 2 (two) times daily., Disp: , Rfl:  .  spironolactone (ALDACTONE) 25 MG tablet, Take 25 mg by mouth daily., Disp: , Rfl:  .  tamsulosin (FLOMAX) 0.4 MG CAPS capsule, Take 1 capsule (0.4 mg total) by mouth daily. (Patient taking differently: Take 0.8 mg by mouth 2 (two) times daily. ), Disp: 30 capsule, Rfl: 3  Review of Systems  Constitutional: Negative for appetite change, chills and fever.  Respiratory: Negative for chest tightness, shortness of breath and wheezing.   Cardiovascular: Negative for chest pain and palpitations.  Gastrointestinal: Positive for constipation. Negative for abdominal pain, nausea and vomiting.    Social History  Substance Use Topics  . Smoking status: Former Smoker    Packs/day: 1.00    Years: 25.00    Types: Cigarettes    Quit date: 05/29/1988  . Smokeless tobacco: Current User    Types: Chew     Comment: chews 1 bag of loose leaf chew/week  . Alcohol use No   Objective:   BP 120/64 (BP  Location: Left Arm, Patient Position: Sitting, Cuff Size: Large)   Pulse 98   Temp 98.3 F (36.8 C) (Oral)   Resp 18   Ht 5\' 8"  (1.727 m)   Wt 291 lb (132 kg)   SpO2 95%   BMI 44.25 kg/m   Physical Exam  General Appearance:    Alert, cooperative, no distress, obese  Eyes:    PERRL, conjunctiva/corneas clear, EOM's intact       Lungs:     Clear to auscultation bilaterally, respirations unlabored  Heart:    Regular rate and rhythm  Neurologic:   Awake, alert, oriented x 3. No apparent focal neurological           defect.       Results for orders placed or performed in visit on 06/08/16  POCT glycosylated hemoglobin (Hb A1C)  Result Value Ref Range   Hemoglobin A1C 6.7    Est. average glucose Bld gHb Est-mCnc 146        Assessment & Plan:     1. Diabetes mellitus with nephropathy (HCC) Stable. Will reduce glipizide to 5mg  with next refill (about a month away). Follow up to check A1c in 3 months.  - POCT glycosylated hemoglobin (Hb A1C)  2. Constipation, unspecified constipation type UTD on colonoscopy. Recommend metamucil powder every day.        Lelon Huh, MD  [facility]

## 2016-06-13 ENCOUNTER — Other Ambulatory Visit: Payer: Self-pay | Admitting: Family Medicine

## 2016-06-13 DIAGNOSIS — E1121 Type 2 diabetes mellitus with diabetic nephropathy: Secondary | ICD-10-CM

## 2016-06-23 ENCOUNTER — Other Ambulatory Visit: Payer: Self-pay | Admitting: Family Medicine

## 2016-06-26 ENCOUNTER — Encounter: Payer: Self-pay | Admitting: Internal Medicine

## 2016-06-26 ENCOUNTER — Ambulatory Visit (INDEPENDENT_AMBULATORY_CARE_PROVIDER_SITE_OTHER): Payer: PPO | Admitting: Internal Medicine

## 2016-06-26 VITALS — BP 142/86 | HR 85 | Wt 287.0 lb

## 2016-06-26 DIAGNOSIS — Z09 Encounter for follow-up examination after completed treatment for conditions other than malignant neoplasm: Secondary | ICD-10-CM | POA: Diagnosis not present

## 2016-06-26 DIAGNOSIS — L821 Other seborrheic keratosis: Secondary | ICD-10-CM | POA: Diagnosis not present

## 2016-06-26 DIAGNOSIS — C44619 Basal cell carcinoma of skin of left upper limb, including shoulder: Secondary | ICD-10-CM | POA: Diagnosis not present

## 2016-06-26 DIAGNOSIS — G4733 Obstructive sleep apnea (adult) (pediatric): Secondary | ICD-10-CM | POA: Diagnosis not present

## 2016-06-26 DIAGNOSIS — Z872 Personal history of diseases of the skin and subcutaneous tissue: Secondary | ICD-10-CM | POA: Diagnosis not present

## 2016-06-26 DIAGNOSIS — X32XXXA Exposure to sunlight, initial encounter: Secondary | ICD-10-CM | POA: Diagnosis not present

## 2016-06-26 DIAGNOSIS — Z85828 Personal history of other malignant neoplasm of skin: Secondary | ICD-10-CM | POA: Diagnosis not present

## 2016-06-26 DIAGNOSIS — L570 Actinic keratosis: Secondary | ICD-10-CM | POA: Diagnosis not present

## 2016-06-26 DIAGNOSIS — D485 Neoplasm of uncertain behavior of skin: Secondary | ICD-10-CM | POA: Diagnosis not present

## 2016-06-26 NOTE — Progress Notes (Signed)
   Subjective:    Patient ID: Jacob Herrera, male    DOB: June 05, 1950, 66 y.o.   MRN: [id number]  Synopsis: Jacob Herrera first saw the [facility] pulmonary clinic in February 2015 for evaluation of shortness of breath. He had a past medical history significant for asthma. Pulmonary function testing was normal with the exception of restriction secondary to obesity. He was started on a combination long acting bronchodilator/inhaled corticosteroid inhaler and he noted significant benefit from this.   CC: follow up SOB  HPI  H/o  acute CHF with CAD s/p cath is on IC/LABA,(symbicort)has chronic SOB/DOE  No signs of infection at this time  Patient is compliant with biPAP at this time, has been on oxygen since discharge Patient morbidly obese  Patient does NOT smoke at this time  30 day Compliance Report:  97% compliance Leak 107 L/Min AHI 10  10/2015  100% compliance Leak 117.4L/min AHI 12.9  06/2016 compliance report 100% compliance Leak 118 L/min AHI 9.1  Review of Systems  Constitutional: Negative for chills, fatigue and fever.  HENT: Negative for postnasal drip.   Respiratory: Negative for chest tightness, shortness of breath and wheezing.   Cardiovascular: Positive for leg swelling. Negative for chest pain and palpitations.  Musculoskeletal: Positive for back pain and joint swelling.  All other systems reviewed and are negative.      Objective:   Physical Exam Vitals:   06/26/16 1547  BP: (!) 142/86  BP Location: Right Arm  Cuff Size: Normal  Pulse: 85  SpO2: 97%  Weight: 287 lb (130.2 kg)  RA  Gen: obese, no acute distress HENT:  neck supple PULM: CTA B, normal percussion CV: RRR, no mgr,  notable leg edema GI: BS+, soft, nontender Derm: no cyanosis or rash Psyche: normal mood and affect   05/2013 Full FPT > ratio 82%, FEV1 1.95L (67% pred), no change with BD, TLC 3.91 L (64% pred), ERV 0.26L (18% pred), DLCO 15.6 (54% pred)     Assessment &  Plan:   66 yo morbidly bese white male with  OSA with cardiomyopathy with s/p stent with Reactive airways disease and chronic kidney disease  With chronic hypoxic resp failure Overall, patient with very poor prognosis. Patient with multiorgan dysfunction.   1.continue biPAP as prescribed, will need to adjust mask to to decrease leaks, referral made-but patient does NOT want to drive to assess mask 2.albuterol as needed 3.continue symbicort 4.follow up cardiology as needed 5.follow up nephrology as needed   Patient satisfied with Plan of action and management. All questions answered  Corrin Parker, M.D.  [facility]  Medical Director [place] Director [facility] Cardio-Pulmonary Department

## 2016-06-26 NOTE — Patient Instructions (Signed)
Start zyrtec 10 mg  daily at night Mask fitting assessment according to patients schedule

## 2016-06-30 DIAGNOSIS — I5022 Chronic systolic (congestive) heart failure: Secondary | ICD-10-CM | POA: Diagnosis not present

## 2016-07-04 DIAGNOSIS — C44619 Basal cell carcinoma of skin of left upper limb, including shoulder: Secondary | ICD-10-CM | POA: Diagnosis not present

## 2016-07-10 ENCOUNTER — Other Ambulatory Visit (HOSPITAL_BASED_OUTPATIENT_CLINIC_OR_DEPARTMENT_OTHER): Payer: PPO

## 2016-07-10 DIAGNOSIS — N183 Chronic kidney disease, stage 3 (moderate): Secondary | ICD-10-CM | POA: Diagnosis not present

## 2016-07-10 DIAGNOSIS — I129 Hypertensive chronic kidney disease with stage 1 through stage 4 chronic kidney disease, or unspecified chronic kidney disease: Secondary | ICD-10-CM | POA: Diagnosis not present

## 2016-07-10 DIAGNOSIS — E872 Acidosis: Secondary | ICD-10-CM | POA: Diagnosis not present

## 2016-07-10 DIAGNOSIS — R809 Proteinuria, unspecified: Secondary | ICD-10-CM | POA: Diagnosis not present

## 2016-07-10 DIAGNOSIS — E1122 Type 2 diabetes mellitus with diabetic chronic kidney disease: Secondary | ICD-10-CM | POA: Diagnosis not present

## 2016-07-12 DIAGNOSIS — R0602 Shortness of breath: Secondary | ICD-10-CM | POA: Diagnosis not present

## 2016-07-12 DIAGNOSIS — I5032 Chronic diastolic (congestive) heart failure: Secondary | ICD-10-CM | POA: Diagnosis not present

## 2016-07-12 DIAGNOSIS — I2510 Atherosclerotic heart disease of native coronary artery without angina pectoris: Secondary | ICD-10-CM | POA: Diagnosis not present

## 2016-07-12 DIAGNOSIS — I10 Essential (primary) hypertension: Secondary | ICD-10-CM | POA: Diagnosis not present

## 2016-07-12 DIAGNOSIS — I25110 Atherosclerotic heart disease of native coronary artery with unstable angina pectoris: Secondary | ICD-10-CM | POA: Diagnosis not present

## 2016-07-12 DIAGNOSIS — N183 Chronic kidney disease, stage 3 (moderate): Secondary | ICD-10-CM | POA: Diagnosis not present

## 2016-07-12 DIAGNOSIS — E782 Mixed hyperlipidemia: Secondary | ICD-10-CM | POA: Diagnosis not present

## 2016-07-14 ENCOUNTER — Telehealth: Payer: Self-pay | Admitting: Family Medicine

## 2016-07-14 MED ORDER — INSULIN ASPART 100 UNIT/ML FLEXPEN: PEN_INJECTOR | SUBCUTANEOUS | 12 refills | Status: DC

## 2016-07-14 NOTE — Telephone Encounter (Signed)
Pt stated that he would like a nurse to return his call. Pt has questions about the medications he is taking and would like to go over them with a nurse. Thanks TNP

## 2016-07-14 NOTE — Telephone Encounter (Signed)
Medication updated in the chart, pt advised and RX sent in for insulin-aa

## 2016-07-14 NOTE — Telephone Encounter (Signed)
OK to change to Novolog, same directions quantity and refill He does not need to take both omeprazole and pantoprazole. I would suggest he stop omeprazole and continue pantoprazole for the time being. If his reflux remains controlled we will work on getting off of pantoprazole in the future. This medication can rarely cause kidney problems.

## 2016-07-14 NOTE — Telephone Encounter (Signed)
Spoke with patient: 1) He states he was informed by pharmacy medication management that he is taking similar medication in regards to Omeprazole and Pantoprazole, he is taking both he states Omeprazole was started by [place] and Pantoprazole by cardiologist about 2 years ago. He was also informed that these 2 medications are not the best with his kidney issue. 2) Also can he switch from Humalog to Novolog due to Novolog is cheaper now.  Please review-aa

## 2016-07-22 ENCOUNTER — Other Ambulatory Visit: Payer: Self-pay | Admitting: Family Medicine

## 2016-07-25 ENCOUNTER — Encounter: Payer: Self-pay | Admitting: Urology

## 2016-07-25 ENCOUNTER — Ambulatory Visit: Payer: PPO | Admitting: Urology

## 2016-07-25 VITALS — BP 173/82 | HR 76 | Ht 68.0 in | Wt 288.8 lb

## 2016-07-25 DIAGNOSIS — N400 Enlarged prostate without lower urinary tract symptoms: Secondary | ICD-10-CM

## 2016-07-25 DIAGNOSIS — R3915 Urgency of urination: Secondary | ICD-10-CM

## 2016-07-25 NOTE — Progress Notes (Signed)
07/25/2016 2:49 PM   [name] Oct 12, 1950 [id number]  Referring provider: [name], MD [address] [place], [place] [address]  Chief Complaint  Patient presents with  . Benign Prostatic Hypertrophy    HPI: The patient is a 66 year old gentleman with multiple medical comorbidities including C Katie stage IV presents today for reevaluation of his BPH. He is currently on Flomax 0.8 mg for this.  At his last visit, his biggest complaint was having to strain to urinate and having a weak urinary stream. At that time, he was also on Ditropan for his urinary urgency with occasional urge incontinence. He did not find these irritative symptoms nearly as bothersome as having to strain and is weak stream, so his Ditropan was discontinued at his last visit. He was also started on finasteride at this visit 3 months ago.  He has noted a dramatic improvement in his symptoms. Today his I PSS score is 6/2. He has nocturia 1. His mild incomplete emptying, frequency, and urgency symptoms. He denies intermittency, weak stream, and straining. He feels that he empties his bladder. He is overall very happy with his improvement.   He also has sleep apnea and is on a CPAP machine. This has helped with his nocturia. PMH: Past Medical History:  Diagnosis Date  . History of chicken pox   . Hx of skin cancer, basal cell   . Kidney disease   . Scoliosis   . Thyrotoxicosis    2010    Surgical History: Past Surgical History:  Procedure Laterality Date  . CARDIAC CATHETERIZATION N/A 03/31/2015   Procedure: Left Heart Cath and Coronary Angiography;  Surgeon: Teodoro Spray, MD;  Location: [facility];  Service: Cardiovascular;  Laterality: N/A;  . CARDIAC CATHETERIZATION N/A 03/31/2015   Procedure: Coronary Stent Intervention;  Surgeon: Teodoro Spray, MD;  Location: [facility];  Service: Cardiovascular;  Laterality: N/A;  . CARDIAC CATHETERIZATION N/A 03/31/2015   Procedure: Coronary Stent Intervention;  Surgeon: Yolonda Kida, MD;  Location: [facility];  Service: Cardiovascular;  Laterality: N/A;  . NASAL SINUS SURGERY  2005  . TONSILLECTOMY AND ADENOIDECTOMY  1950    Home Medications:  Allergies as of 07/25/2016   No Known Allergies     Medication List       Accurate as of 07/25/16  2:49 PM. Always use your most recent med list.          albuterol (2.5 MG/3ML) 0.083% nebulizer solution Commonly known as:  PROVENTIL Take 3 mLs (2.5 mg total) by nebulization every 4 (four) hours. And PRN   amLODipine 10 MG tablet Commonly known as:  NORVASC Take 1 tablet (10 mg total) by mouth daily.   aspirin 81 MG tablet Take 81 mg by mouth daily.   atorvastatin 40 MG tablet Commonly known as:  LIPITOR TAKE 1 TABLET BY MOUTH AT BEDTIME   BD PEN NEEDLE NANO U/F 32G X 4 MM Misc Generic drug:  Insulin Pen Needle USE DAILY AS DIRECTED   budesonide-formoterol 80-4.5 MCG/ACT inhaler Commonly known as:  SYMBICORT Inhale 2 puffs into the lungs 2 (two) times daily.   carvedilol 12.5 MG tablet Commonly known as:  COREG Take 12.5 mg by mouth 2 (two) times daily with a meal.   clopidogrel 75 MG tablet Commonly known as:  PLAVIX Take 1 tablet (75 mg total) by mouth daily.   Exenatide ER 2 MG Pen Commonly known as:  BYDUREON Inject  2 mg into the skin once a week.   finasteride 5 MG tablet Commonly known as:  PROSCAR Take 1 tablet (5 mg total) by mouth daily.   fluticasone 50 MCG/ACT nasal spray Commonly known as:  FLONASE Place 2 sprays into both nostrils daily.   furosemide 40 MG tablet Commonly known as:  LASIX Take 40 mg by mouth 2 (two) times daily.   glipiZIDE 5 MG tablet Commonly known as:  GLUCOTROL Take 1 tablet (5 mg total) by mouth daily before breakfast.   hydrALAZINE 10 MG tablet Commonly known as:  APRESOLINE Take 10 mg by mouth 2 (two) times daily.   insulin aspart 100 UNIT/ML FlexPen Commonly known as:   NOVOLOG FLEXPEN INJECT UP TO 12 UNITS UNITS SUBCUTANEOUS FOUR TIMES DAILY BEFORE MEALS   LANTUS SOLOSTAR 100 UNIT/ML Solostar Pen Generic drug:  Insulin Glargine TITRATE UP TO 80 UNITS, ONCE A DAY   levothyroxine 125 MCG tablet Commonly known as:  SYNTHROID, LEVOTHROID TAKE 1 TABLET (125 MCG TOTAL) BY MOUTH DAILY.   losartan 100 MG tablet Commonly known as:  COZAAR Take 1 tablet (100 mg total) by mouth daily.   Lysine 500 MG Caps Take 1 capsule by mouth daily.   ONE TOUCH ULTRA TEST test strip Generic drug:  glucose blood USE ONE STRIP ONCE DAILY   oxyCODONE 5 MG immediate release tablet Commonly known as:  Oxy IR/ROXICODONE Take 1 tablet (5 mg total) by mouth daily as needed for severe pain.   pantoprazole 40 MG tablet Commonly known as:  PROTONIX Take 1 tablet (40 mg total) by mouth daily before breakfast.   sodium bicarbonate 650 MG tablet Take 650 mg by mouth 2 (two) times daily.   spironolactone 25 MG tablet Commonly known as:  ALDACTONE Take 25 mg by mouth daily.   tamsulosin 0.4 MG Caps capsule Commonly known as:  FLOMAX Take 1 capsule (0.4 mg total) by mouth daily.       Allergies: No Known Allergies  Family History: Family History  Problem Relation Age of Onset  . Emphysema Father   . Cancer - Lung Father   . Cancer - Ovarian Mother     Social History:  reports that he quit smoking about 28 years ago. His smoking use included Cigarettes. He has a 25.00 pack-year smoking history. His smokeless tobacco use includes Chew. He reports that he does not drink alcohol or use drugs.  ROS: UROLOGY Frequent Urination?: Yes Hard to postpone urination?: Yes Burning/pain with urination?: No Get up at night to urinate?: Yes Leakage of urine?: No Urine stream starts and stops?: No Trouble starting stream?: No Do you have to strain to urinate?: No Blood in urine?: No Urinary tract infection?: No Sexually transmitted disease?: No Injury to kidneys or  bladder?: No Painful intercourse?: No Weak stream?: No Erection problems?: No Penile pain?: No  Gastrointestinal Nausea?: No Vomiting?: No Indigestion/heartburn?: No Diarrhea?: No Constipation?: Yes  Constitutional Fever: No Night sweats?: No Weight loss?: No Fatigue?: No  Skin Skin rash/lesions?: No Itching?: No  Eyes Blurred vision?: No Double vision?: No  Ears/Nose/Throat Sore throat?: No Sinus problems?: Yes  Hematologic/Lymphatic Swollen glands?: No Easy bruising?: No  Cardiovascular Leg swelling?: No Chest pain?: No  Respiratory Cough?: No Shortness of breath?: Yes  Endocrine Excessive thirst?: Yes  Musculoskeletal Back pain?: Yes Joint pain?: No  Neurological Headaches?: No Dizziness?: No  Psychologic Depression?: No Anxiety?: No  Physical Exam: BP (!) 173/82 (BP Location: Left Arm, Patient Position: Sitting, Cuff  Size: Large)   Pulse 76   Ht 5\' 8"  (1.727 m)   Wt 288 lb 12.8 oz (131 kg)   BMI 43.91 kg/m   Constitutional:  Alert and oriented, No acute distress. HEENT: [place] AT, moist mucus membranes.  Trachea midline, no masses. Cardiovascular: No clubbing, cyanosis, or edema. Respiratory: Normal respiratory effort, no increased work of breathing. GI: Abdomen is soft, nontender, nondistended, no abdominal masses GU: No CVA tenderness.  Skin: No rashes, bruises or suspicious lesions. Lymph: No cervical or inguinal adenopathy. Neurologic: Grossly intact, no focal deficits, moving all 4 extremities. Psychiatric: Normal mood and affect.  Laboratory Data: Lab Results  Component Value Date   WBC 6.6 11/09/2014   HGB 13.5 07/08/2013   HCT 38.9 11/09/2014   MCV 86 11/09/2014   PLT 241 11/09/2014    Lab Results  Component Value Date   CREATININE 1.38 (H) 04/01/2015    No results found for: PSA  No results found for: TESTOSTERONE  Lab Results  Component Value Date   HGBA1C 6.7 06/08/2016    Urinalysis    Component Value  Date/Time   COLORURINE Yellow 07/04/2013 1226   APPEARANCEUR Hazy 07/04/2013 1226   LABSPEC 1.020 07/04/2013 1226   PHURINE 5.0 07/04/2013 1226   GLUCOSEU 50 mg/dL 07/04/2013 1226   HGBUR Negative 07/04/2013 1226   BILIRUBINUR Negative 07/04/2013 1226   KETONESUR Negative 07/04/2013 1226   PROTEINUR >=500 07/04/2013 1226   NITRITE Negative 07/04/2013 1226   LEUKOCYTESUR Negative 07/04/2013 1226     Assessment & Plan:    1. BPH -continue flomax 0.8 mg daily -continue finasteride -follow up annually  Return in about 1 year (around 07/25/2017).  Nickie Retort, MD  [facility] [address] [place], [place] [address] [phone]

## 2016-07-28 ENCOUNTER — Telehealth: Payer: Self-pay | Admitting: Family Medicine

## 2016-07-28 DIAGNOSIS — M25541 Pain in joints of right hand: Secondary | ICD-10-CM | POA: Diagnosis not present

## 2016-07-28 DIAGNOSIS — M1811 Unilateral primary osteoarthritis of first carpometacarpal joint, right hand: Secondary | ICD-10-CM | POA: Diagnosis not present

## 2016-07-28 NOTE — Telephone Encounter (Signed)
Patient came to window and wanted to talk with nurse, wanted to ask a question about bariatric surgery. Told him he may have to make appt for that. He wanted someone to call him first before making appt

## 2016-07-28 NOTE — Telephone Encounter (Signed)
Called pt. Was asking if he was a candidate for bypass surgery. Pt advised he has a BMI of 44, with co-morbidities, therefore is a candidate for the surgery. Appointment scheduled for 08/07/2016 to discuss. Renaldo Fiddler, CMA

## 2016-07-30 DIAGNOSIS — M1811 Unilateral primary osteoarthritis of first carpometacarpal joint, right hand: Secondary | ICD-10-CM | POA: Insufficient documentation

## 2016-07-31 DIAGNOSIS — I5022 Chronic systolic (congestive) heart failure: Secondary | ICD-10-CM | POA: Diagnosis not present

## 2016-08-02 ENCOUNTER — Other Ambulatory Visit: Payer: Self-pay | Admitting: Family Medicine

## 2016-08-04 ENCOUNTER — Telehealth: Payer: Self-pay | Admitting: Internal Medicine

## 2016-08-04 NOTE — Telephone Encounter (Signed)
Pt calling wanting to get Dr Zoila Shutter feedback on him getting bariatric surgery Please call back

## 2016-08-04 NOTE — Telephone Encounter (Signed)
Spoke with pt, who states he would like he would like DK's on feedback on bariatric surgery. Pt states after he had cardiac stent placed, he was placed on oxygen temporary. Pt currently wears 2L O2 qhs bled in cpap. Last OV was 06/26/2016.  DK please advise. Thanks.

## 2016-08-07 ENCOUNTER — Other Ambulatory Visit: Payer: Self-pay | Admitting: Family Medicine

## 2016-08-07 ENCOUNTER — Encounter: Payer: Self-pay | Admitting: Family Medicine

## 2016-08-07 ENCOUNTER — Ambulatory Visit (INDEPENDENT_AMBULATORY_CARE_PROVIDER_SITE_OTHER): Payer: PPO | Admitting: Family Medicine

## 2016-08-07 MED ORDER — GLIPIZIDE 5 MG PO TABS: 5.0000 mg | ORAL_TABLET | Freq: Every day | ORAL | 2 refills | Status: DC

## 2016-08-07 NOTE — Progress Notes (Signed)
Patient: Jacob Herrera Male    DOB: [birth year]   66 y.o.   MRN: [id number] Visit Date: 08/07/2016  Today's Provider: Lelon Huh, MD   Chief Complaint  Patient presents with  . Obesity   Subjective:    HPI Obesity:  Patientpresents for a consultation to discuss Bariatric surgery. Patient has tried loosing weight through exercise, but states he is limited to what he can do due to back issues. He has not been any structured diets, but has tried losing by reducing portion size, which has not been effecitve   Wt Readings from Last 3 Encounters:  08/07/16 292 lb (132.5 kg)  07/25/16 288 lb 12.8 oz (131 kg)  06/26/16 287 lb (130.2 kg)    Been to program for elevated blood sugar about 3 years ago.     Patient Active Problem List   Diagnosis Date Noted  . 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 Diastolic heart failure ([place]) 02/15/2015  . Regional wall motion abnormality of heart 02/15/2015  . Restrictive lung disease 11/10/2014  . History of adenomatous polyp of colon 11/09/2014  . Hypothyroidism 11/09/2014  . Diabetes mellitus with nephropathy ([place]) 11/09/2014  . Hyperlipidemia 11/09/2014  . Depression 11/09/2014  . Insomnia 11/09/2014  . Hypertension 11/09/2014  . GERD (gastroesophageal reflux disease) 11/09/2014  . Chronic kidney disease (CKD), stage III (moderate) 11/09/2014  . Dyspnea on exertion 11/09/2014  . DDD (degenerative disc disease), lumbar 09/09/2014  . Facet syndrome, lumbar ([place]) 09/09/2014  . 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 Greater trochanteric bursitis of both hips 09/09/2014  . Allergic rhinitis 08/12/2014  . Benign prostatic hyperplasia with urinary obstruction 06/24/2013  . Nocturia 05/30/2013  . Asthma, chronic 05/29/2013  . Morbid obesity ([place]) 05/29/2013  . OSA treated with BiPAP 05/29/2013  . Malignant melanoma of skin of face ([place]) 08/01/2011  . SCC (squamous cell carcinoma), face 08/01/2011    Past Medical History:  Diagnosis Date  . History of chicken pox   . Hx of skin cancer, basal cell   . Kidney disease   . Scoliosis   . Thyrotoxicosis    2010    No Known Allergies   Current Outpatient Prescriptions:  .  albuterol (PROVENTIL) (2.5 MG/3ML) 0.083% nebulizer solution, Take 3 mLs (2.5 mg total) by nebulization every 4 (four) hours. And PRN, Disp: 75 mL, Rfl: 12 .  amLODipine (NORVASC) 10 MG tablet, Take 1 tablet (10 mg total) by mouth daily., Disp: 30 tablet, Rfl: 1 .  aspirin 81 MG tablet, Take 81 mg by mouth daily., Disp: , Rfl:  .  atorvastatin (LIPITOR) 40 MG tablet, TAKE 1 TABLET BY MOUTH AT BEDTIME, Disp: 90 tablet, Rfl: 4 .  BD PEN NEEDLE NANO U/F 32G X 4 MM MISC, USE DAILY AS DIRECTED, Disp: 100 each, Rfl: 4 .  budesonide-formoterol (SYMBICORT) 80-4.5 MCG/ACT inhaler, Inhale 2 puffs into the lungs 2 (two) times daily., Disp: 3 Inhaler, Rfl: 2 .  carvedilol (COREG) 12.5 MG tablet, Take 12.5 mg by mouth 2 (two) times daily with a meal., Disp: , Rfl:  .  clopidogrel (PLAVIX) 75 MG tablet, Take 1 tablet (75 mg total) by mouth daily., Disp: 30 tablet, Rfl: 11 .  Exenatide ER (BYDUREON) 2 MG PEN, Inject 2 mg into the skin once a week., Disp: 4 each, Rfl: 12 .  finasteride (PROSCAR) 5 MG tablet, Take 1 tablet (5 mg total) by mouth daily., Disp: 30 tablet, Rfl: 11 .  fluticasone (FLONASE) 50 MCG/ACT nasal spray, Place 2 sprays into both nostrils daily., Disp: 1 g, Rfl: 2 .  furosemide (LASIX) 40 MG tablet, Take 40 mg by mouth 2 (two) times daily. , Disp: , Rfl:  .  glipiZIDE (GLUCOTROL) 5 MG tablet, Take 1 tablet (5 mg total) by mouth daily before breakfast., Disp: 90 tablet, Rfl: 2 .  hydrALAZINE (APRESOLINE) 10 MG tablet, Take 10 mg by mouth 2 (two) times daily., Disp: , Rfl:  .  insulin aspart (NOVOLOG FLEXPEN) 100 UNIT/ML FlexPen, INJECT UP TO 12 UNITS UNITS SUBCUTANEOUS FOUR TIMES DAILY BEFORE MEALS, Disp: 1500 mL, Rfl: 12 .  LANTUS SOLOSTAR 100 UNIT/ML Solostar Pen,  TITRATE UP TO 80 UNITS, ONCE A DAY, Disp: 8 pen, Rfl: 12 .  levothyroxine (SYNTHROID, LEVOTHROID) 125 MCG tablet, TAKE 1 TABLET (125 MCG TOTAL) BY MOUTH DAILY., Disp: 30 tablet, Rfl: 6 .  losartan (COZAAR) 100 MG tablet, Take 1 tablet (100 mg total) by mouth daily., Disp: 30 tablet, Rfl: 1 .  Lysine 500 MG CAPS, Take 1 capsule by mouth daily., Disp: , Rfl:  .  ONE TOUCH ULTRA TEST test strip, USE ONE STRIP ONCE DAILY, Disp: 100 each, Rfl: 4 .  oxyCODONE (OXY IR/ROXICODONE) 5 MG immediate release tablet, Take 1 tablet (5 mg total) by mouth daily as needed for severe pain., Disp: 30 tablet, Rfl: 0 .  pantoprazole (PROTONIX) 40 MG tablet, Take 1 tablet (40 mg total) by mouth daily before breakfast., Disp: 30 tablet, Rfl: 3 .  sodium bicarbonate 650 MG tablet, Take 650 mg by mouth 2 (two) times daily., Disp: , Rfl:  .  spironolactone (ALDACTONE) 25 MG tablet, Take 25 mg by mouth daily., Disp: , Rfl:  .  tamsulosin (FLOMAX) 0.4 MG CAPS capsule, Take 1 capsule (0.4 mg total) by mouth daily. (Patient taking differently: Take 0.8 mg by mouth 2 (two) times daily. ), Disp: 30 capsule, Rfl: 3  Review of Systems  Constitutional: Negative for appetite change, chills and fever.  Respiratory: Negative for chest tightness, shortness of breath and wheezing.   Cardiovascular: Negative for chest pain and palpitations.  Gastrointestinal: Negative for abdominal pain, nausea and vomiting.  Musculoskeletal: Positive for back pain.    Social History  Substance Use Topics  . Smoking status: Former Smoker    Packs/day: 1.00    Years: 25.00    Types: Cigarettes    Quit date: 05/29/1988  . Smokeless tobacco: Current User    Types: Chew     Comment: chews 1 bag of loose leaf chew/week  . Alcohol use No   Objective:   BP (!) 154/80 (BP Location: Left Arm, Patient Position: Sitting, Cuff Size: Large)   Pulse 72   Temp 97.4 F (36.3 C) (Oral)   Resp 16   Ht 5\' 8"  (1.727 m)   Wt 292 lb (132.5 kg)   SpO2 95%  Comment: room air  BMI 44.40 kg/m  There were no vitals filed for this visit.   Physical Exam  General Appearance:    Alert, cooperative, no distress, obese  Eyes:    PERRL, conjunctiva/corneas clear, EOM's intact       Lungs:     Clear to auscultation bilaterally, respirations unlabored  Heart:    Regular rate and rhythm  Neurologic:   Awake, alert, oriented x 3. No apparent focal neurological           defect.           Assessment & Plan:  1. Morbid obesity ([place]) He has multiple weight related co-morbidities and would likely benefit from significant weight loss. He is not sure which surgeon he would like to see. He did go to a workshop at [facility] and will follow up with some contact information. Advised he needs to go ahead and start more structured diet such as 1500 calories/day and work on increasing exercise to average of 30 minutes per day. He should follow op in a month to document progress.        Lelon Huh, MD  [facility]

## 2016-08-07 NOTE — Telephone Encounter (Signed)
Go for it. Please make referral

## 2016-08-08 NOTE — Telephone Encounter (Signed)
Pt informed we can place the referral but it is in [place]. Pt states he did go to an information session there. States if he decides he wants the referral placed he will call us back. Will close at this time.

## 2016-08-14 ENCOUNTER — Other Ambulatory Visit: Payer: Self-pay | Admitting: Family Medicine

## 2016-08-14 DIAGNOSIS — Z794 Long term (current) use of insulin: Principal | ICD-10-CM

## 2016-08-14 DIAGNOSIS — IMO0002 Reserved for concepts with insufficient information to code with codable children: Secondary | ICD-10-CM

## 2016-08-14 DIAGNOSIS — E1165 Type 2 diabetes mellitus with hyperglycemia: Secondary | ICD-10-CM

## 2016-08-14 DIAGNOSIS — E118 Type 2 diabetes mellitus with unspecified complications: Principal | ICD-10-CM

## 2016-08-15 DIAGNOSIS — E782 Mixed hyperlipidemia: Secondary | ICD-10-CM | POA: Diagnosis not present

## 2016-08-15 DIAGNOSIS — I5032 Chronic diastolic (congestive) heart failure: Secondary | ICD-10-CM | POA: Diagnosis not present

## 2016-08-15 DIAGNOSIS — B029 Zoster without complications: Secondary | ICD-10-CM | POA: Diagnosis not present

## 2016-08-15 DIAGNOSIS — I10 Essential (primary) hypertension: Secondary | ICD-10-CM | POA: Diagnosis not present

## 2016-08-16 DIAGNOSIS — I5032 Chronic diastolic (congestive) heart failure: Secondary | ICD-10-CM | POA: Diagnosis not present

## 2016-08-16 DIAGNOSIS — J45909 Unspecified asthma, uncomplicated: Secondary | ICD-10-CM | POA: Diagnosis not present

## 2016-08-16 DIAGNOSIS — I5022 Chronic systolic (congestive) heart failure: Secondary | ICD-10-CM | POA: Diagnosis not present

## 2016-08-16 DIAGNOSIS — G4733 Obstructive sleep apnea (adult) (pediatric): Secondary | ICD-10-CM | POA: Diagnosis not present

## 2016-08-30 DIAGNOSIS — I5022 Chronic systolic (congestive) heart failure: Secondary | ICD-10-CM | POA: Diagnosis not present

## 2016-09-06 ENCOUNTER — Ambulatory Visit (INDEPENDENT_AMBULATORY_CARE_PROVIDER_SITE_OTHER): Payer: PPO | Admitting: Family Medicine

## 2016-09-06 ENCOUNTER — Encounter: Payer: Self-pay | Admitting: Family Medicine

## 2016-09-06 VITALS — BP 144/78 | HR 74 | Temp 98.1°F | Resp 24 | Wt 288.0 lb

## 2016-09-06 DIAGNOSIS — E785 Hyperlipidemia, unspecified: Secondary | ICD-10-CM

## 2016-09-06 DIAGNOSIS — E1121 Type 2 diabetes mellitus with diabetic nephropathy: Secondary | ICD-10-CM | POA: Diagnosis not present

## 2016-09-06 DIAGNOSIS — B029 Zoster without complications: Secondary | ICD-10-CM

## 2016-09-06 LAB — POCT GLYCOSYLATED HEMOGLOBIN (HGB A1C)
Est. average glucose Bld gHb Est-mCnc: 146
Hemoglobin A1C: 6.7

## 2016-09-06 NOTE — Progress Notes (Signed)
Patient: Jacob Herrera Male    DOB: Sep 09, 1950   66 y.o.   MRN: [id number] Visit Date: 09/06/2016  Today's Provider: Lelon Huh, MD   Chief Complaint  Patient presents with  . Diabetes    follow up  . Coronary Artery Disease    follow up  . Hypothyroidism    follow up  . Chronic Kidney Disease    follow up   Subjective:    HPI  Diabetes Mellitus Type II, Follow-up:   Lab Results  Component Value Date   HGBA1C 6.7 06/08/2016   HGBA1C 6.6 03/07/2016   HGBA1C 6.7 11/09/2015    Last seen for diabetes 3 months ago.  Management since then includes reducing Glipizide to 5mg  which he just started a few weeks ago. Patient was later changed from Humalog to Novolog due to cost.  He reports good compliance with treatment. He is not having side effects.  Current symptoms include paresthesia of the feet, polydipsia, polyuria and visual disturbances and have been worsening. Home blood sugar records: fasting range: 139-212  Episodes of hypoglycemia? no   Current Insulin Regimen: Lantus 50-65 units daily and Novolog 20-35 units  Most Recent Eye Exam: < 1 year Weight trend: fluctuating a bit Prior visit with dietician: no Current diet: in general, an "unhealthy" diet Current exercise: none  Pertinent Labs:    Component Value Date/Time   CHOL CANCELED 11/10/2015 0909   TRIG CANCELED 11/10/2015 0909   HDL CANCELED 11/10/2015 0909   LDLCALC 69 11/10/2015 0000   CREATININE 1.38 (H) 04/01/2015 0906   CREATININE 1.36 (H) 07/08/2013 0555    Wt Readings from Last 3 Encounters:  08/07/16 292 lb (132.5 kg)  07/25/16 288 lb 12.8 oz (131 kg)  06/26/16 287 lb (130.2 kg)    ------------------------------------------------------------------------ Follow up of CAD:  Patient was last seen for this problem 6 months ago and no changes were made. Continues regular follow up with Dr. Ubaldo Glassing.   Follow up of Hypothyroidism:  Patient was last seen for this problem 10 months ago  and no changes were made. Patient reports good compliance with treatment and good tolerance. Lab Results  Component Value Date   TSH CANCELED 11/10/2015     Follow up of CKD:  Patient was last seen for this problem 6 months ago and no changes were made. Patient was advised to continue regular follow up with Nephrology. Patient reports he was seen about 2 months ago by his Nephrologist.     No Known Allergies   Current Outpatient Prescriptions:  .  albuterol (PROVENTIL) (2.5 MG/3ML) 0.083% nebulizer solution, Take 3 mLs (2.5 mg total) by nebulization every 4 (four) hours. And PRN, Disp: 75 mL, Rfl: 12 .  amLODipine (NORVASC) 10 MG tablet, Take 1 tablet (10 mg total) by mouth daily., Disp: 30 tablet, Rfl: 1 .  aspirin 81 MG tablet, Take 81 mg by mouth daily., Disp: , Rfl:  .  atorvastatin (LIPITOR) 40 MG tablet, TAKE 1 TABLET BY MOUTH AT BEDTIME, Disp: 90 tablet, Rfl: 4 .  BD PEN NEEDLE NANO U/F 32G X 4 MM MISC, USE DAILY AS DIRECTED, Disp: 100 each, Rfl: 4 .  budesonide-formoterol (SYMBICORT) 80-4.5 MCG/ACT inhaler, Inhale 2 puffs into the lungs 2 (two) times daily., Disp: 3 Inhaler, Rfl: 2 .  BYDUREON 2 MG PEN, INJECT 2 MG INTO THE SKIN ONCE A WEEK, Disp: 4 each, Rfl: 11 .  carvedilol (COREG) 12.5 MG tablet, Take 12.5 mg by  mouth 2 (two) times daily with a meal., Disp: , Rfl:  .  clopidogrel (PLAVIX) 75 MG tablet, Take 1 tablet (75 mg total) by mouth daily., Disp: 30 tablet, Rfl: 11 .  finasteride (PROSCAR) 5 MG tablet, Take 1 tablet (5 mg total) by mouth daily., Disp: 30 tablet, Rfl: 11 .  fluticasone (FLONASE) 50 MCG/ACT nasal spray, Place 2 sprays into both nostrils daily., Disp: 1 g, Rfl: 2 .  furosemide (LASIX) 40 MG tablet, Take 40 mg by mouth 2 (two) times daily. , Disp: , Rfl:  .  glipiZIDE (GLUCOTROL) 5 MG tablet, Take 1 tablet (5 mg total) by mouth daily before breakfast., Disp: 90 tablet, Rfl: 2 .  hydrALAZINE (APRESOLINE) 10 MG tablet, Take 10 mg by mouth 2 (two) times daily.,  Disp: , Rfl:  .  insulin aspart (NOVOLOG FLEXPEN) 100 UNIT/ML FlexPen, INJECT UP TO 12 UNITS UNITS SUBCUTANEOUS FOUR TIMES DAILY BEFORE MEALS, Disp: 1500 mL, Rfl: 12 .  LANTUS SOLOSTAR 100 UNIT/ML Solostar Pen, TITRATE UP TO 80 UNITS, ONCE A DAY, Disp: 8 pen, Rfl: 12 .  levothyroxine (SYNTHROID, LEVOTHROID) 125 MCG tablet, TAKE 1 TABLET (125 MCG TOTAL) BY MOUTH DAILY., Disp: 30 tablet, Rfl: 6 .  losartan (COZAAR) 100 MG tablet, Take 1 tablet (100 mg total) by mouth daily., Disp: 30 tablet, Rfl: 1 .  Lysine 500 MG CAPS, Take 1 capsule by mouth daily., Disp: , Rfl:  .  ONE TOUCH ULTRA TEST test strip, USE ONE STRIP ONCE DAILY, Disp: 100 each, Rfl: 4 .  oxyCODONE (OXY IR/ROXICODONE) 5 MG immediate release tablet, Take 1 tablet (5 mg total) by mouth daily as needed for severe pain., Disp: 30 tablet, Rfl: 0 .  pantoprazole (PROTONIX) 40 MG tablet, Take 1 tablet (40 mg total) by mouth daily before breakfast., Disp: 30 tablet, Rfl: 3 .  sodium bicarbonate 650 MG tablet, Take 650 mg by mouth 2 (two) times daily., Disp: , Rfl:  .  spironolactone (ALDACTONE) 25 MG tablet, Take 25 mg by mouth daily., Disp: , Rfl:  .  tamsulosin (FLOMAX) 0.4 MG CAPS capsule, Take 1 capsule (0.4 mg total) by mouth daily. (Patient taking differently: Take 0.8 mg by mouth 2 (two) times daily. ), Disp: 30 capsule, Rfl: 3 .  gabapentin (NEURONTIN) 300 MG capsule, TAKE AS DIRECTED. DO NOT STOP ABRUPTLY, Disp: , Rfl: 2  Review of Systems  Constitutional: Negative for appetite change, chills and fever.  Respiratory: Positive for shortness of breath. Negative for chest tightness and wheezing.   Cardiovascular: Negative for chest pain and palpitations.  Gastrointestinal: Negative for abdominal pain, nausea and vomiting.  Endocrine: Positive for polydipsia and polyuria.  Skin: Positive for rash.  Neurological: Positive for numbness.    Social History  Substance Use Topics  . Smoking status: Former Smoker    Packs/day: 1.00     Years: 25.00    Types: Cigarettes    Quit date: 05/29/1988  . Smokeless tobacco: Current User    Types: Chew     Comment: chews 1 bag of loose leaf chew/week  . Alcohol use No   Objective:   BP (!) 144/78 (BP Location: Left Arm, Patient Position: Sitting, Cuff Size: Normal)   Pulse 74   Temp 98.1 F (36.7 C) (Oral)   Resp (!) 24   Wt 288 lb (130.6 kg)   SpO2 96% Comment: room air  BMI 43.79 kg/m  There were no vitals filed for this visit.   Physical Exam   General  Appearance:    Alert, cooperative, no distress, obese  Eyes:    PERRL, conjunctiva/corneas clear, EOM's intact       Lungs:     Clear to auscultation bilaterally, respirations unlabored  Heart:    Regular rate and rhythm  Neurologic:   Awake, alert, oriented x 3. No apparent focal neurological           defect.        Results for orders placed or performed in visit on 09/06/16  POCT HgB A1C  Result Value Ref Range   Hemoglobin A1C 6.7    Est. average glucose Bld gHb Est-mCnc 146        Assessment & Plan:     1. Diabetes mellitus with nephropathy (HCC) Stable. Continue current medications.   - POCT HgB A1C  2. Herpes zoster without complication Is on valacyclovir prescription by dermatology. Has been using capsaicin on rash, but advised only to use on dry intact skin.   3. Hyperlipidemia, unspecified hyperlipidemia type He is tolerating atorvastatin well with no adverse effects.   He is going to  - Lipid panel  4. Coronary artery disease Asymptomatic. Compliant with medication.  Continue aggressive risk factor modification.  Continue regular follow up dr. Almond Lint, MD  [facility]

## 2016-09-06 NOTE — Patient Instructions (Signed)
Only use capsaicin on dry intact skin

## 2016-09-30 DIAGNOSIS — I5022 Chronic systolic (congestive) heart failure: Secondary | ICD-10-CM | POA: Diagnosis not present

## 2016-10-30 DIAGNOSIS — I5022 Chronic systolic (congestive) heart failure: Secondary | ICD-10-CM | POA: Diagnosis not present

## 2016-11-14 DIAGNOSIS — B0229 Other postherpetic nervous system involvement: Secondary | ICD-10-CM | POA: Diagnosis not present

## 2016-11-14 DIAGNOSIS — L570 Actinic keratosis: Secondary | ICD-10-CM | POA: Diagnosis not present

## 2016-11-14 DIAGNOSIS — C44519 Basal cell carcinoma of skin of other part of trunk: Secondary | ICD-10-CM | POA: Diagnosis not present

## 2016-11-14 DIAGNOSIS — D485 Neoplasm of uncertain behavior of skin: Secondary | ICD-10-CM | POA: Diagnosis not present

## 2016-11-14 DIAGNOSIS — Z08 Encounter for follow-up examination after completed treatment for malignant neoplasm: Secondary | ICD-10-CM | POA: Diagnosis not present

## 2016-11-14 DIAGNOSIS — Z85828 Personal history of other malignant neoplasm of skin: Secondary | ICD-10-CM | POA: Diagnosis not present

## 2016-11-14 DIAGNOSIS — X32XXXA Exposure to sunlight, initial encounter: Secondary | ICD-10-CM | POA: Diagnosis not present

## 2016-11-14 DIAGNOSIS — Z85820 Personal history of malignant melanoma of skin: Secondary | ICD-10-CM | POA: Diagnosis not present

## 2016-11-23 DIAGNOSIS — E785 Hyperlipidemia, unspecified: Secondary | ICD-10-CM | POA: Diagnosis not present

## 2016-11-24 ENCOUNTER — Telehealth: Payer: Self-pay

## 2016-11-24 LAB — LIPID PANEL
Chol/HDL Ratio: 5.4 ratio — ABNORMAL HIGH (ref 0.0–5.0)
Cholesterol, Total: 183 mg/dL (ref 100–199)
HDL: 34 mg/dL — ABNORMAL LOW (ref >39)
LDL Calculated: 87 mg/dL (ref 0–99)
Triglycerides: 310 mg/dL — ABNORMAL HIGH (ref 0–149)
VLDL Cholesterol Cal: 62 mg/dL — ABNORMAL HIGH (ref 5–40)

## 2016-11-24 NOTE — Telephone Encounter (Signed)
-----   Message from [name], MD sent at 11/24/2016  8:32 AM EDT ----- LDL is 87, needs to be under 70 due to diabetes and heart disease. Increase atorvastatin to 80mg  daily #90, rf x 2. Will recheck lipids at follow up in September. Call if any problems or side effects after changing dose.

## 2016-11-24 NOTE — Telephone Encounter (Signed)
Left message to call back  

## 2016-11-24 NOTE — Telephone Encounter (Signed)
Advised patient of results. Patient will double up on 40mg  tablets until his follow up in Sept.

## 2016-11-30 DIAGNOSIS — I5022 Chronic systolic (congestive) heart failure: Secondary | ICD-10-CM | POA: Diagnosis not present

## 2016-12-08 DIAGNOSIS — G5601 Carpal tunnel syndrome, right upper limb: Secondary | ICD-10-CM | POA: Diagnosis not present

## 2016-12-11 DIAGNOSIS — G5601 Carpal tunnel syndrome, right upper limb: Secondary | ICD-10-CM | POA: Diagnosis not present

## 2016-12-12 ENCOUNTER — Ambulatory Visit (INDEPENDENT_AMBULATORY_CARE_PROVIDER_SITE_OTHER): Payer: PPO | Admitting: Family Medicine

## 2016-12-12 ENCOUNTER — Encounter: Payer: Self-pay | Admitting: Family Medicine

## 2016-12-12 VITALS — BP 140/74 | HR 77 | Temp 97.9°F | Resp 18 | Wt 283.0 lb

## 2016-12-12 DIAGNOSIS — N63 Unspecified lump in unspecified breast: Secondary | ICD-10-CM | POA: Diagnosis not present

## 2016-12-12 DIAGNOSIS — E785 Hyperlipidemia, unspecified: Secondary | ICD-10-CM

## 2016-12-12 DIAGNOSIS — M545 Low back pain, unspecified: Secondary | ICD-10-CM

## 2016-12-12 DIAGNOSIS — I10 Essential (primary) hypertension: Secondary | ICD-10-CM | POA: Diagnosis not present

## 2016-12-12 DIAGNOSIS — E1121 Type 2 diabetes mellitus with diabetic nephropathy: Secondary | ICD-10-CM

## 2016-12-12 DIAGNOSIS — G8929 Other chronic pain: Secondary | ICD-10-CM

## 2016-12-12 LAB — POCT GLYCOSYLATED HEMOGLOBIN (HGB A1C)
Est. average glucose Bld gHb Est-mCnc: 137
Hemoglobin A1C: 6.4

## 2016-12-12 MED ORDER — INSULIN GLARGINE 100 UNIT/ML SOLOSTAR PEN: PEN_INJECTOR | SUBCUTANEOUS | 4 refills | Status: DC

## 2016-12-12 MED ORDER — ATORVASTATIN CALCIUM 80 MG PO TABS: 80.0000 mg | ORAL_TABLET | Freq: Every day | ORAL | 3 refills | Status: DC

## 2016-12-12 MED ORDER — OXYCODONE HCL 5 MG PO TABS: 5.0000 mg | ORAL_TABLET | Freq: Every day | ORAL | 0 refills | Status: DC | PRN

## 2016-12-12 NOTE — Progress Notes (Signed)
Patient: Jacob Herrera Male    DOB: 03/20/1951   66 y.o.   MRN: [id number] Visit Date: 12/12/2016  Today's Provider: Lelon Huh, MD   Chief Complaint  Patient presents with  . Breast Problem   Subjective:    Patient found a lump on his right breast 1 month ago. Lump is at 10-11 o'clock. Patient states that area of the breast is very sore.    He also presents for follow up of diabetes. Is doing well with current meds. Last a1c in May was 6.7.   Increased atorvastatin from 40 to 80mg  a few weeks ago and is tolerating well.  Lipid Panel     Component Value Date/Time   CHOL 183 11/23/2016 1053   TRIG 310 (H) 11/23/2016 1053   HDL 34 (L) 11/23/2016 1053   CHOLHDL 5.4 (H) 11/23/2016 1053   LDLCALC 87 11/23/2016 1053        No Known Allergies   Current Outpatient Prescriptions:  .  albuterol (PROVENTIL) (2.5 MG/3ML) 0.083% nebulizer solution, Take 3 mLs (2.5 mg total) by nebulization every 4 (four) hours. And PRN, Disp: 75 mL, Rfl: 12 .  amLODipine (NORVASC) 10 MG tablet, Take 1 tablet (10 mg total) by mouth daily., Disp: 30 tablet, Rfl: 1 .  aspirin 81 MG tablet, Take 81 mg by mouth daily., Disp: , Rfl:  .  atorvastatin (LIPITOR) 40 MG tablet, TAKE 1 TABLET BY MOUTH AT BEDTIME (Patient taking differently: TAKE 2 TABLET BY MOUTH AT BEDTIME), Disp: 90 tablet, Rfl: 4 .  BD PEN NEEDLE NANO U/F 32G X 4 MM MISC, USE DAILY AS DIRECTED, Disp: 100 each, Rfl: 4 .  budesonide-formoterol (SYMBICORT) 80-4.5 MCG/ACT inhaler, Inhale 2 puffs into the lungs 2 (two) times daily., Disp: 3 Inhaler, Rfl: 2 .  BYDUREON 2 MG PEN, INJECT 2 MG INTO THE SKIN ONCE A WEEK, Disp: 4 each, Rfl: 11 .  carvedilol (COREG) 12.5 MG tablet, Take 12.5 mg by mouth 2 (two) times daily with a meal., Disp: , Rfl:  .  clopidogrel (PLAVIX) 75 MG tablet, Take 1 tablet (75 mg total) by mouth daily., Disp: 30 tablet, Rfl: 11 .  finasteride (PROSCAR) 5 MG tablet, Take 1 tablet (5 mg total) by mouth daily.,  Disp: 30 tablet, Rfl: 11 .  fluticasone (FLONASE) 50 MCG/ACT nasal spray, Place 2 sprays into both nostrils daily., Disp: 1 g, Rfl: 2 .  furosemide (LASIX) 40 MG tablet, Take 40 mg by mouth 2 (two) times daily. , Disp: , Rfl:  .  gabapentin (NEURONTIN) 300 MG capsule, 300 mg 3 (three) times daily.  TAKE AS DIRECTED. DO NOT STOP ABRUPTLY, Disp: , Rfl: 2 .  glipiZIDE (GLUCOTROL) 5 MG tablet, Take 1 tablet (5 mg total) by mouth daily before breakfast., Disp: 90 tablet, Rfl: 2 .  hydrALAZINE (APRESOLINE) 10 MG tablet, Take 10 mg by mouth 2 (two) times daily., Disp: , Rfl:  .  insulin aspart (NOVOLOG FLEXPEN) 100 UNIT/ML FlexPen, INJECT UP TO 12 UNITS UNITS SUBCUTANEOUS FOUR TIMES DAILY BEFORE MEALS, Disp: 1500 mL, Rfl: 12 .  LANTUS SOLOSTAR 100 UNIT/ML Solostar Pen, TITRATE UP TO 80 UNITS, ONCE A DAY, Disp: 8 pen, Rfl: 12 .  levothyroxine (SYNTHROID, LEVOTHROID) 125 MCG tablet, TAKE 1 TABLET (125 MCG TOTAL) BY MOUTH DAILY., Disp: 30 tablet, Rfl: 6 .  losartan (COZAAR) 100 MG tablet, Take 1 tablet (100 mg total) by mouth daily., Disp: 30 tablet, Rfl: 1 .  Lysine 500  MG CAPS, Take 1 capsule by mouth daily., Disp: , Rfl:  .  ONE TOUCH ULTRA TEST test strip, USE ONE STRIP ONCE DAILY, Disp: 100 each, Rfl: 4 .  oxyCODONE (OXY IR/ROXICODONE) 5 MG immediate release tablet, Take 1 tablet (5 mg total) by mouth daily as needed for severe pain., Disp: 30 tablet, Rfl: 0 .  pantoprazole (PROTONIX) 40 MG tablet, Take 1 tablet (40 mg total) by mouth daily before breakfast., Disp: 30 tablet, Rfl: 3 .  sodium bicarbonate 650 MG tablet, Take 650 mg by mouth 2 (two) times daily., Disp: , Rfl:  .  spironolactone (ALDACTONE) 25 MG tablet, Take 25 mg by mouth daily., Disp: , Rfl:  .  tamsulosin (FLOMAX) 0.4 MG CAPS capsule, Take 1 capsule (0.4 mg total) by mouth daily. (Patient taking differently: Take 0.8 mg by mouth 2 (two) times daily. ), Disp: 30 capsule, Rfl: 3  Review of Systems  Constitutional: Negative for appetite  change, chills and fever.  Respiratory: Negative for chest tightness, shortness of breath and wheezing.   Cardiovascular: Negative for chest pain and palpitations.  Gastrointestinal: Negative for abdominal pain, nausea and vomiting.    Social History  Substance Use Topics  . Smoking status: Former Smoker    Packs/day: 1.00    Years: 25.00    Types: Cigarettes    Quit date: 05/29/1988  . Smokeless tobacco: Current User    Types: Chew     Comment: chews 1 bag of loose leaf chew/week  . Alcohol use No   Objective:   BP 140/74 (BP Location: Right Arm, Patient Position: Sitting, Cuff Size: Large)   Pulse 77   Temp 97.9 F (36.6 C) (Oral)   Resp 18   Wt 283 lb (128.4 kg)   SpO2 95%   BMI 43.03 kg/m     Physical Exam   General Appearance:    Alert, cooperative, no distress  Eyes:    PERRL, conjunctiva/corneas clear, EOM's intact       Lungs:     Clear to auscultation bilaterally, respirations unlabored  Heart:    Regular rate and rhythm  Neurologic:   Awake, alert, oriented x 3. No apparent focal neurological           defect.   Breast:   Tender ill defined mass right breast about 9 o'clock.     Results for orders placed or performed in visit on 12/12/16  POCT glycosylated hemoglobin (Hb A1C)  Result Value Ref Range   Hemoglobin A1C 6.4    Est. average glucose Bld gHb Est-mCnc 137        Assessment & Plan:     .1. Breast nodule  - MM DIAG BREAST TOMO BILATERAL; Future - US BREAST LTD UNI LEFT INC AXILLA; Future - US BREAST LTD UNI RIGHT INC AXILLA; Future  2. Diabetes mellitus with nephropathy ([place]) Well controlled.  Continue current medications.   - POCT glycosylated hemoglobin (Hb A1C)  3. Essential hypertension Well controlled.  Continue current medications.    4. Hyperlipidemia, unspecified hyperlipidemia type Doing well on recently increased dose of atorvastatin. Given printed prescription to fill when 40s are out. Check lipids at follow up in 3 months.    - atorvastatin (LIPITOR) 80 MG tablet; Take 1 tablet (80 mg total) by mouth at bedtime.  Dispense: 90 tablet; Refill: 3  5. Chronic bilateral low back pain without sciatica Stable, due for refill of oxycodone.  - oxyCODONE (OXY IR/ROXICODONE) 5 MG immediate release tablet; Take 1  tablet (5 mg total) by mouth daily as needed for severe pain.  Dispense: 30 tablet; Refill: 0       Lelon Huh, MD  [facility]

## 2016-12-19 ENCOUNTER — Other Ambulatory Visit: Payer: Self-pay | Admitting: Family Medicine

## 2016-12-19 DIAGNOSIS — E1121 Type 2 diabetes mellitus with diabetic nephropathy: Secondary | ICD-10-CM

## 2016-12-20 ENCOUNTER — Ambulatory Visit
Admission: RE | Admit: 2016-12-20 | Discharge: 2016-12-20 | Disposition: A | Discharge status: Home / Self Care | Payer: PPO | Source: Ambulatory Visit | Attending: Family Medicine | Admitting: Family Medicine

## 2016-12-20 DIAGNOSIS — N63 Unspecified lump in unspecified breast: Secondary | ICD-10-CM

## 2016-12-20 DIAGNOSIS — N6310 Unspecified lump in the right breast, unspecified quadrant: Secondary | ICD-10-CM | POA: Diagnosis present

## 2016-12-20 DIAGNOSIS — N62 Hypertrophy of breast: Secondary | ICD-10-CM | POA: Insufficient documentation

## 2016-12-20 DIAGNOSIS — N6489 Other specified disorders of breast: Secondary | ICD-10-CM | POA: Diagnosis not present

## 2016-12-20 DIAGNOSIS — R922 Inconclusive mammogram: Secondary | ICD-10-CM | POA: Diagnosis not present

## 2016-12-21 ENCOUNTER — Telehealth: Payer: Self-pay | Admitting: *Deleted

## 2016-12-21 ENCOUNTER — Other Ambulatory Visit: Payer: Self-pay | Admitting: Family Medicine

## 2016-12-21 DIAGNOSIS — N62 Hypertrophy of breast: Secondary | ICD-10-CM

## 2016-12-21 NOTE — Telephone Encounter (Signed)
-----  Message from [name], MD sent at 12/21/2016  8:25 AM EDT ----- Ultrasound shows enlargement of breast glands(gynecomastia) but no breast cancer or tumor. Need to check labs to determine cause of this.  Need to order hCG, LH, total testosterone, estradiol level, TSH and met C

## 2016-12-21 NOTE — Telephone Encounter (Signed)
Pt returned call.  I told him there is a lab slip at the desk for him.  Thanks [employer]

## 2016-12-21 NOTE — Telephone Encounter (Signed)
Patient was notified of results. Expressed understanding.  

## 2016-12-21 NOTE — Telephone Encounter (Signed)
LMOVM for pt to return call. Laps have been ordered and slip is at front desk for pick up.

## 2016-12-22 LAB — COMPREHENSIVE METABOLIC PANEL
ALT: 21 IU/L (ref 0–44)
AST: 19 IU/L (ref 0–40)
Albumin/Globulin Ratio: 1.6 (ref 1.2–2.2)
Albumin: 4.3 g/dL (ref 3.6–4.8)
Alkaline Phosphatase: 68 IU/L (ref 39–117)
BUN/Creatinine Ratio: 16 (ref 10–24)
BUN: 25 mg/dL (ref 8–27)
Bilirubin Total: 0.2 mg/dL (ref 0.0–1.2)
CO2: 18 mmol/L — ABNORMAL LOW (ref 20–29)
Calcium: 9.6 mg/dL (ref 8.6–10.2)
Chloride: 103 mmol/L (ref 96–106)
Creatinine, Ser: 1.53 mg/dL — ABNORMAL HIGH (ref 0.76–1.27)
GFR calc Af Amer: 53 mL/min/{1.73_m2} — ABNORMAL LOW (ref >59)
GFR calc non Af Amer: 46 mL/min/{1.73_m2} — ABNORMAL LOW (ref >59)
Globulin, Total: 2.7 g/dL (ref 1.5–4.5)
Glucose: 70 mg/dL (ref 65–99)
Potassium: 4.7 mmol/L (ref 3.5–5.2)
Sodium: 139 mmol/L (ref 134–144)
Total Protein: 7 g/dL (ref 6.0–8.5)

## 2016-12-22 LAB — HCG, SERUM, QUALITATIVE: hCG,Beta Subunit,Qual,Serum: NEGATIVE m[IU]/mL

## 2016-12-22 LAB — ESTRADIOL: Estradiol: 13 pg/mL (ref 7.6–42.6)

## 2016-12-22 LAB — TESTOSTERONE: Testosterone: 163 ng/dL — ABNORMAL LOW (ref 264–916)

## 2016-12-22 LAB — TSH: TSH: 3.48 u[IU]/mL (ref 0.450–4.500)

## 2016-12-22 LAB — LUTEINIZING HORMONE: LH: 3.8 m[IU]/mL (ref 1.7–8.6)

## 2016-12-24 LAB — SPECIMEN STATUS REPORT

## 2016-12-24 LAB — PROLACTIN: Prolactin: 20.4 ng/mL — ABNORMAL HIGH (ref 4.0–15.2)

## 2016-12-29 ENCOUNTER — Telehealth: Payer: Self-pay

## 2016-12-29 DIAGNOSIS — E229 Hyperfunction of pituitary gland, unspecified: Secondary | ICD-10-CM

## 2016-12-29 NOTE — Telephone Encounter (Signed)
Advised patient as below. Order for MRI has been placed.

## 2016-12-29 NOTE — Telephone Encounter (Signed)
-----   Message from [name], MD sent at 12/28/2016  1:21 PM EDT ----- Elevated prolactin indicates overactive pituitary gland. Needs MRI pituitary gland and schedule follow up o.v. 1-2 days after MRI.

## 2016-12-31 DIAGNOSIS — I5022 Chronic systolic (congestive) heart failure: Secondary | ICD-10-CM | POA: Diagnosis not present

## 2017-01-01 DIAGNOSIS — C44519 Basal cell carcinoma of skin of other part of trunk: Secondary | ICD-10-CM | POA: Diagnosis not present

## 2017-01-05 IMAGING — CR DG CHEST 2V
1 series · 2 of 2 positions shown · non-contrast
Comparison: Chest radiograph 11/08/2011

CLINICAL DATA: Shortness of breath with exertion.

EXAM:
CHEST  2 VIEW

[Series 1: dg chest 2 view · 0.14mm/px · 2 of 2 slices shown]
[im 1/2]
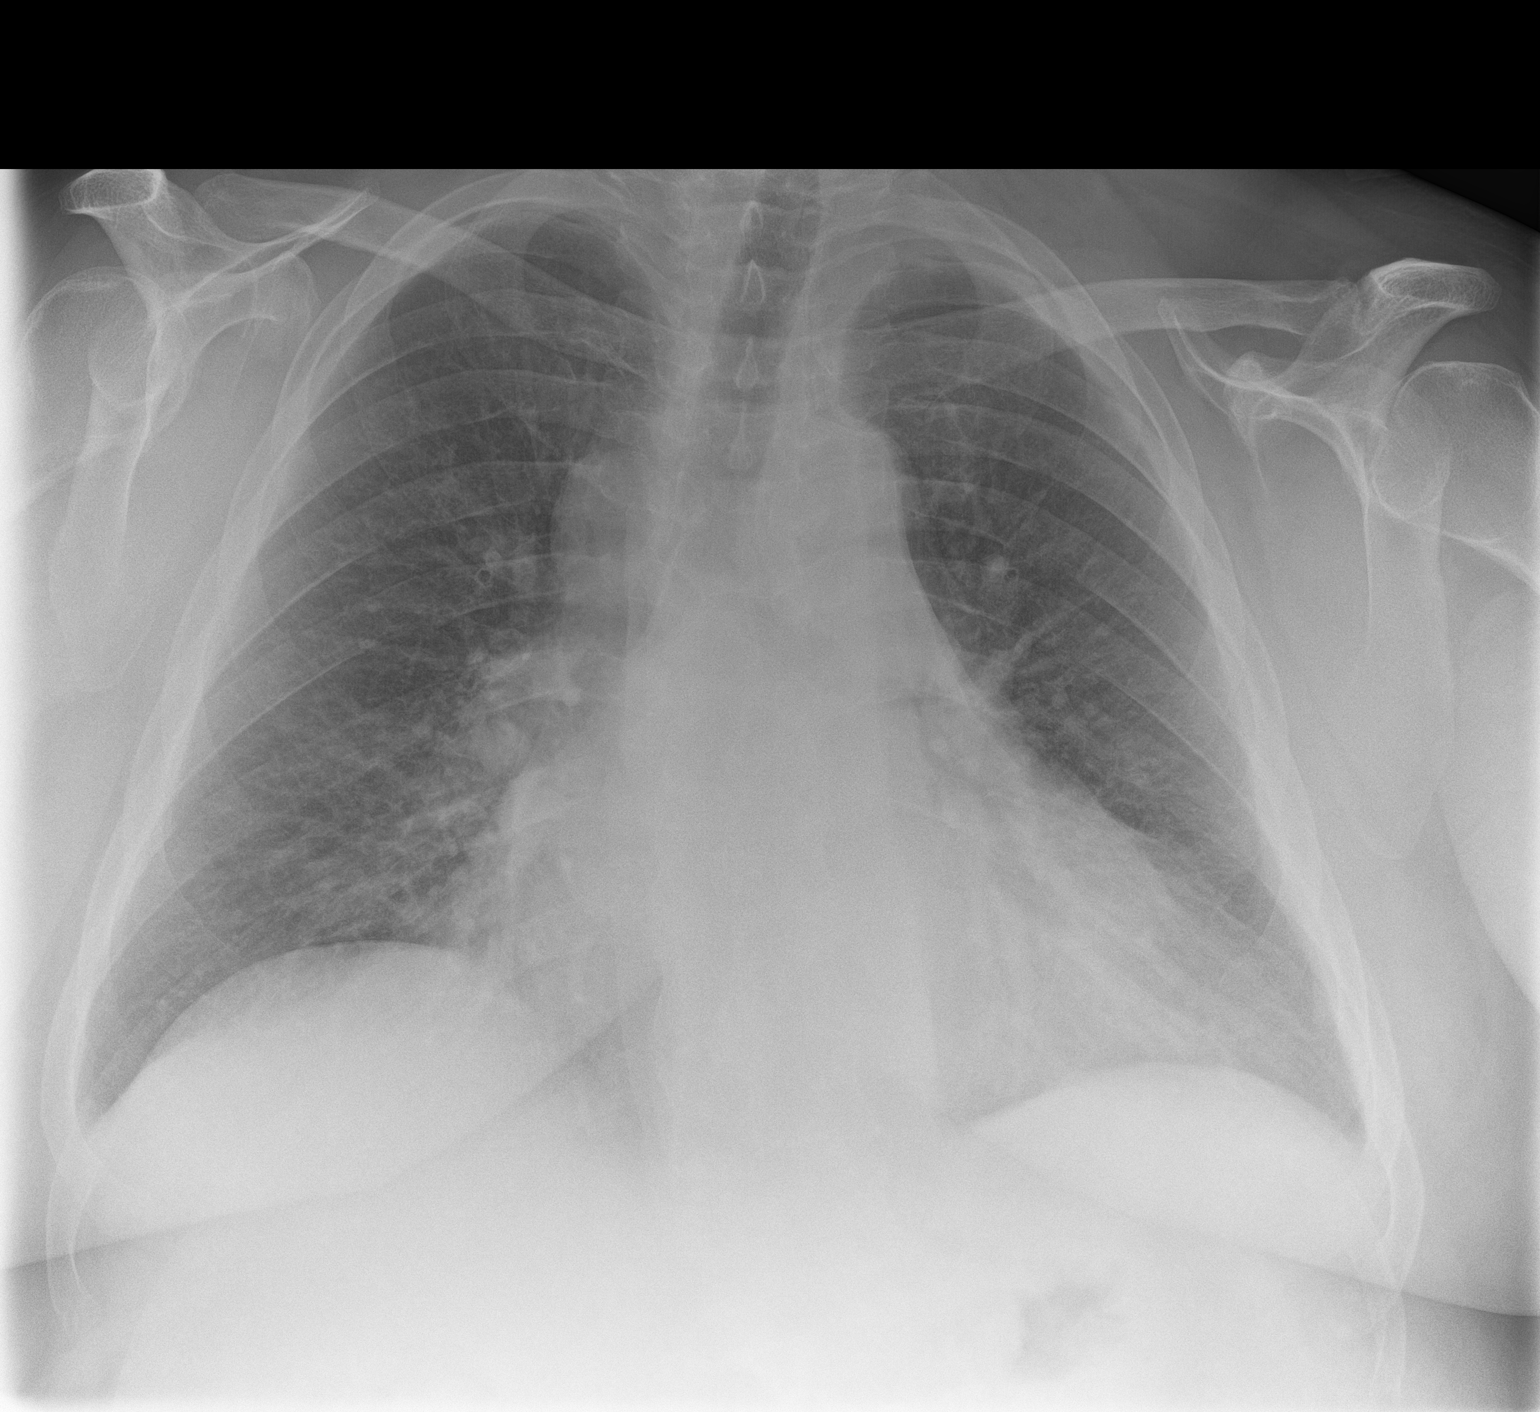
[im 2/2]
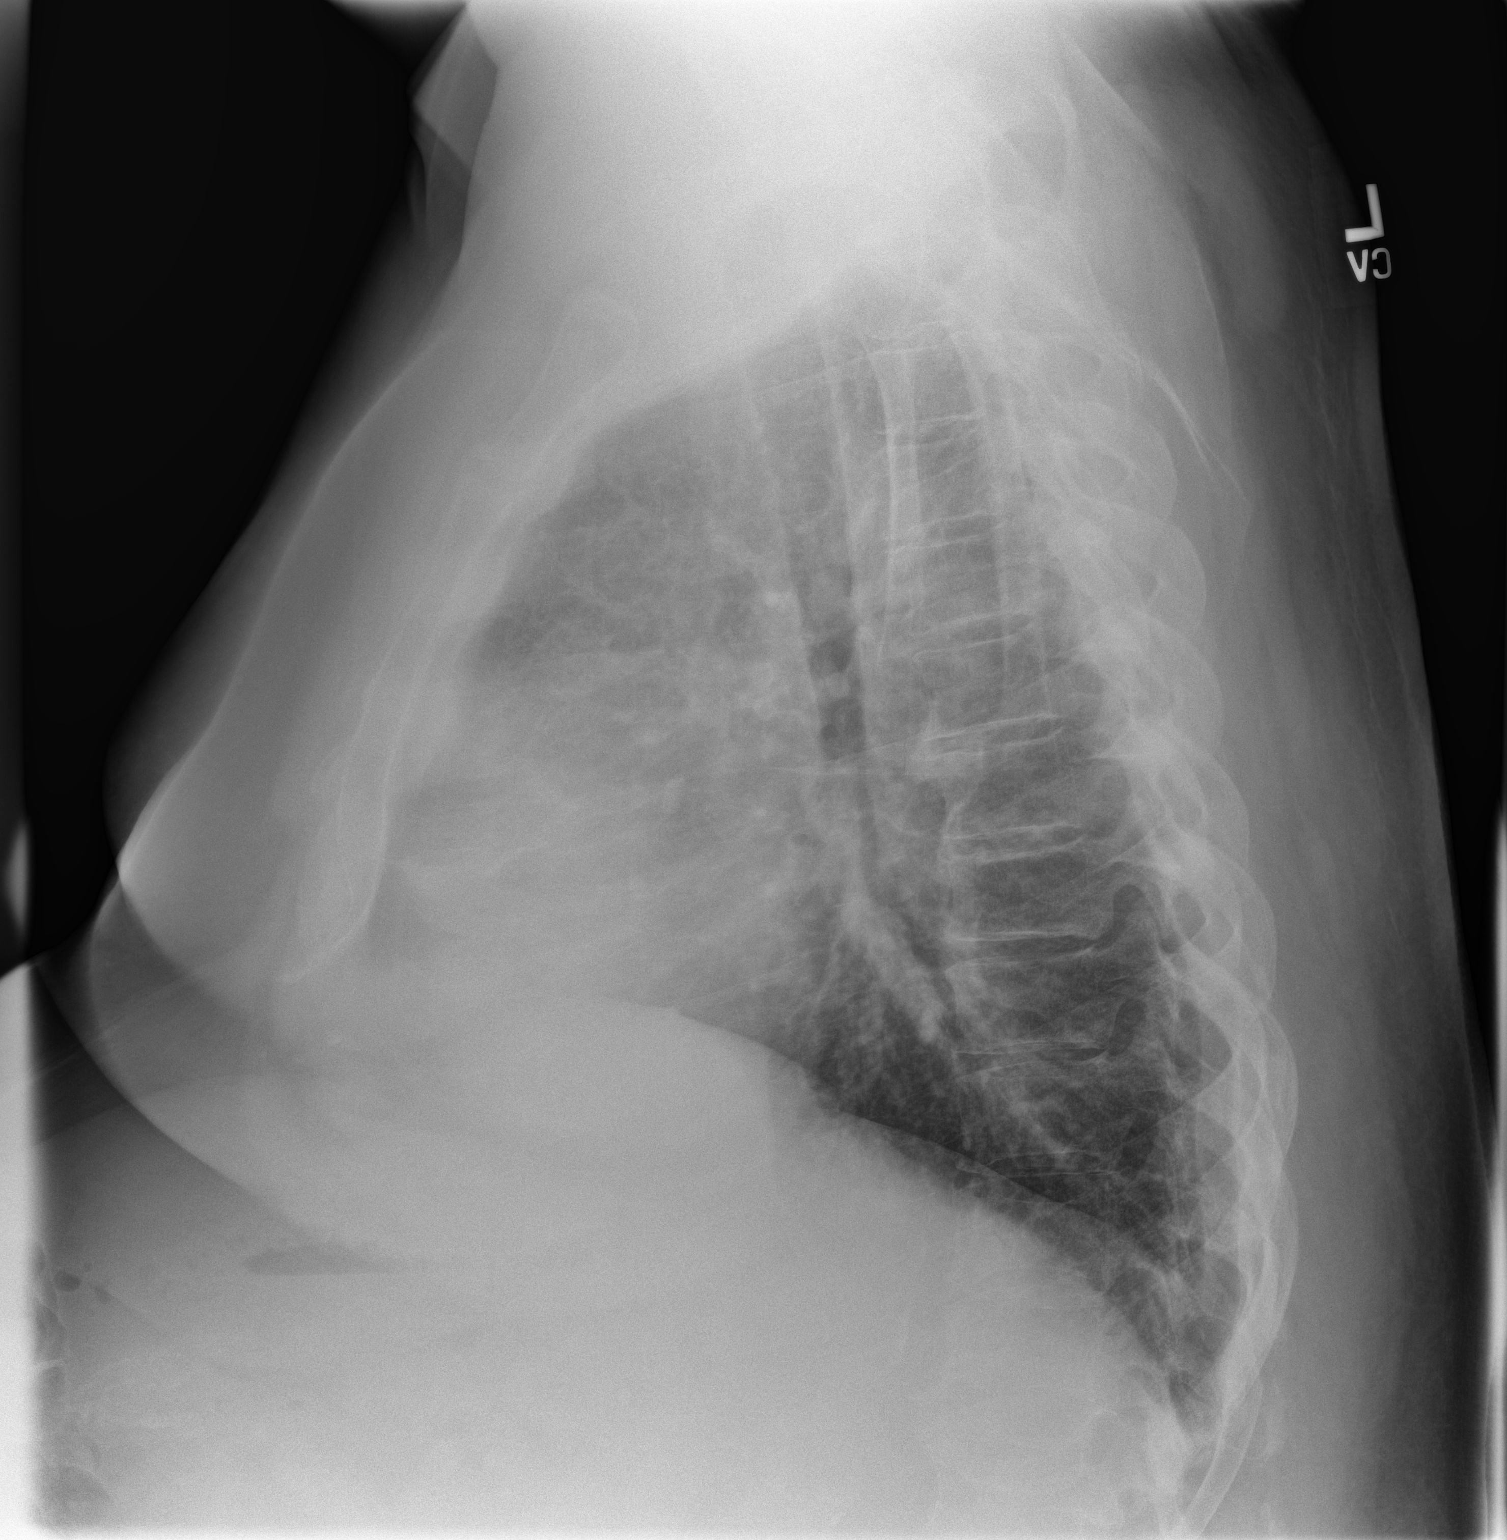

[2 of 2 positions shown; findings below may reference images not displayed]

FINDINGS: Marked cardiomegaly. Pulmonary vascular redistribution. Bilateral
predominately perihilar interstitial pulmonary opacities. No pleural
effusion or pneumothorax. Thoracic spine degenerative changes.
IMPRESSION: Marked cardiomegaly with pulmonary vascular redistribution and mild
interstitial edema.

## 2017-01-09 DIAGNOSIS — I2510 Atherosclerotic heart disease of native coronary artery without angina pectoris: Secondary | ICD-10-CM | POA: Diagnosis not present

## 2017-01-09 DIAGNOSIS — I25110 Atherosclerotic heart disease of native coronary artery with unstable angina pectoris: Secondary | ICD-10-CM | POA: Diagnosis not present

## 2017-01-09 DIAGNOSIS — E782 Mixed hyperlipidemia: Secondary | ICD-10-CM | POA: Diagnosis not present

## 2017-01-10 ENCOUNTER — Telehealth: Payer: Self-pay | Admitting: Family Medicine

## 2017-01-10 NOTE — Telephone Encounter (Signed)
Pt states he would like to hold off on getting MRI done.Dr Ubaldo Glassing has taken him off of stironolactone and told pt that side effect of this may be breast soreness.He would like to see if symptoms clear up.Please advise

## 2017-01-11 ENCOUNTER — Ambulatory Visit: Payer: PPO | Admitting: Family Medicine

## 2017-01-11 DIAGNOSIS — E875 Hyperkalemia: Secondary | ICD-10-CM | POA: Diagnosis not present

## 2017-01-11 DIAGNOSIS — E1122 Type 2 diabetes mellitus with diabetic chronic kidney disease: Secondary | ICD-10-CM | POA: Diagnosis not present

## 2017-01-11 DIAGNOSIS — N2581 Secondary hyperparathyroidism of renal origin: Secondary | ICD-10-CM | POA: Diagnosis not present

## 2017-01-11 DIAGNOSIS — E119 Type 2 diabetes mellitus without complications: Secondary | ICD-10-CM | POA: Diagnosis not present

## 2017-01-11 DIAGNOSIS — N183 Chronic kidney disease, stage 3 (moderate): Secondary | ICD-10-CM | POA: Diagnosis not present

## 2017-01-11 DIAGNOSIS — E872 Acidosis: Secondary | ICD-10-CM | POA: Diagnosis not present

## 2017-01-11 DIAGNOSIS — I129 Hypertensive chronic kidney disease with stage 1 through stage 4 chronic kidney disease, or unspecified chronic kidney disease: Secondary | ICD-10-CM | POA: Diagnosis not present

## 2017-01-11 DIAGNOSIS — R809 Proteinuria, unspecified: Secondary | ICD-10-CM | POA: Diagnosis not present

## 2017-01-11 LAB — HM DIABETES EYE EXAM

## 2017-01-11 NOTE — Telephone Encounter (Signed)
Can put MRI on hold for now, need to recheck prolactin levels 2 weeks after stopping spironolactone. Will contact patient when it is time to check levels.

## 2017-01-12 ENCOUNTER — Ambulatory Visit: Payer: PPO | Admitting: Family Medicine

## 2017-01-12 NOTE — Telephone Encounter (Signed)
Patient advised-Jacob Herrera V Ziana Heyliger, RMA  

## 2017-01-30 DIAGNOSIS — I5022 Chronic systolic (congestive) heart failure: Secondary | ICD-10-CM | POA: Diagnosis not present

## 2017-02-07 DIAGNOSIS — M1811 Unilateral primary osteoarthritis of first carpometacarpal joint, right hand: Secondary | ICD-10-CM | POA: Diagnosis not present

## 2017-02-12 ENCOUNTER — Other Ambulatory Visit: Payer: Self-pay

## 2017-02-12 DIAGNOSIS — N401 Enlarged prostate with lower urinary tract symptoms: Secondary | ICD-10-CM

## 2017-02-12 MED ORDER — FINASTERIDE 5 MG PO TABS: 5.0000 mg | ORAL_TABLET | Freq: Every day | ORAL | 11 refills | Status: DC

## 2017-02-25 ENCOUNTER — Other Ambulatory Visit: Payer: Self-pay | Admitting: Family Medicine

## 2017-03-02 ENCOUNTER — Ambulatory Visit: Payer: PPO | Admitting: Internal Medicine

## 2017-03-02 DIAGNOSIS — I5022 Chronic systolic (congestive) heart failure: Secondary | ICD-10-CM | POA: Diagnosis not present

## 2017-03-06 ENCOUNTER — Encounter: Payer: Self-pay | Admitting: Internal Medicine

## 2017-03-06 ENCOUNTER — Ambulatory Visit: Payer: PPO | Admitting: Internal Medicine

## 2017-03-06 VITALS — BP 170/88 | HR 80 | Ht 68.0 in | Wt 283.0 lb

## 2017-03-06 DIAGNOSIS — J9611 Chronic respiratory failure with hypoxia: Secondary | ICD-10-CM | POA: Diagnosis not present

## 2017-03-06 DIAGNOSIS — G4733 Obstructive sleep apnea (adult) (pediatric): Secondary | ICD-10-CM | POA: Diagnosis not present

## 2017-03-06 NOTE — Progress Notes (Signed)
Subjective:    Patient ID: Jacob Herrera, male    DOB: January 12, 1951, 66 y.o.   MRN: [id number]  Synopsis: Jacob Herrera first saw the [facility] pulmonary clinic in February 2015 for evaluation of shortness of breath. He had a past medical history significant for asthma. Pulmonary function testing was normal with the exception of restriction secondary to obesity. He was started on a combination long acting bronchodilator/inhaled corticosteroid inhaler and he noted significant benefit from this.   CC: follow up SOB  HPI  H/o  acute CHF with CAD s/p cath is on IC/LABA,(symbicort)has chronic SOB/DOE  No signs of infection at this time  Patient is compliant with biPAP at this time, has been on oxygen since discharge Patient morbidly obese  Patient does NOT smoke at this time   10/2015  100% compliance Leak 117.4L/min AHI 12.9  06/2016 compliance report 100% compliance Leak 118 L/min AHI 9.1  11/18 100% compliance Leak 24 L/min AHI14  Overall, patient doing well from hsi biPAP therapy He feels well Has no signs of CHF exacerbation at this time No signs of infection  Review of Systems  Constitutional: Negative for chills, fatigue and fever.  HENT: Negative for postnasal drip.   Respiratory: Negative for chest tightness, shortness of breath and wheezing.   Cardiovascular: Positive for leg swelling. Negative for chest pain and palpitations.  Musculoskeletal: Positive for back pain and joint swelling.  All other systems reviewed and are negative.      Objective:   Physical Exam Vitals:   03/06/17 1137  Weight: 283 lb (128.4 kg)  Height: 5\' 8"  (1.727 m)  RA  Physical Examination:   GENERAL:NAD, no fevers, chills, no weakness no fatigue HEAD: Normocephalic, atraumatic.  EYES: Pupils equal, round, reactive to light. Extraocular muscles intact. No scleral icterus.  MOUTH: Moist mucosal membrane. Dentition intact. No abscess noted.  EAR, NOSE, THROAT: Clear  without exudates. No external lesions.  NECK: Supple. No thyromegaly. No nodules. No JVD.  PULMONARY:CTA b/l CARDIOVASCULAR: S1 and S2. Regular rate and rhythm. No murmurs, rubs, or gallops. No edema. Pedal pulses 2+ bilaterally.  GASTROINTESTINAL: Soft, nontender, nondistended. No masses. Positive bowel sounds. No hepatosplenomegaly.  MUSCULOSKELETAL: No swelling, clubbing, or edema. Range of motion full in all extremities.  NEUROLOGIC: Cranial nerves II through XII are intact. No gross focal neurological deficits. Sensation intact. Reflexes intact.  SKIN: No ulceration, lesions, rashes, or cyanosis. Skin warm and dry. Turgor intact.  PSYCHIATRIC: Mood, affect within normal limits. The patient is awake, alert and oriented x 3. Insight, judgment intact.  ALL OTHER ROS ARE NEGATIVE      05/2013 Full FPT > ratio 82%, FEV1 1.95L (67% pred), no change with BD, TLC 3.91 L (64% pred), ERV 0.26L (18% pred), DLCO 15.6 (54% pred)     Assessment & Plan:   66 yo morbidly bese white male with  OSA with cardiomyopathy with s/p stent with Reactive airways disease and chronic kidney disease with chronic hypoxic resp failure Overall, patient with very poor prognosis. Patient with multiorgan dysfunction.    #1 chronic shortness of breath Related to underlying cardiomyopathy as well as his morbid obesity along with underlying sleep apnea and underlying reactive airways disease  #2 reactive airways disease likely from his morbid obesity Responds well to inhaler therapy No acute issues at this time and no indication for steroids at this time  #3 cardiomyopathy Patient is to follow-up with Dr. Ubaldo Glassing as scheduled  #4 CKD Follow-up with the nephrology   #  5 underlying sleep apnea Compliance report discussed with patient Patient feels really well with his BiPAP therapy No longer has mask issues Continue therapy as prescribed   Patient satisfied with Plan of action and management. All questions  answered  Corrin Parker, M.D.  [facility]  Medical Director [place] Director [facility] Cardio-Pulmonary Department

## 2017-03-06 NOTE — Patient Instructions (Addendum)
  Continue inhalers as prescribed Continue noninvasive ventilation as prescribed Follow-up with cardiology and nephrology

## 2017-03-20 ENCOUNTER — Encounter: Payer: Self-pay | Admitting: Family Medicine

## 2017-03-20 ENCOUNTER — Other Ambulatory Visit: Payer: Self-pay

## 2017-03-20 ENCOUNTER — Ambulatory Visit: Payer: PPO | Admitting: Family Medicine

## 2017-03-20 VITALS — BP 150/84 | HR 79 | Temp 97.9°F | Resp 18 | Ht 68.0 in | Wt 284.0 lb

## 2017-03-20 DIAGNOSIS — Z23 Encounter for immunization: Secondary | ICD-10-CM

## 2017-03-20 DIAGNOSIS — R7989 Other specified abnormal findings of blood chemistry: Secondary | ICD-10-CM | POA: Diagnosis not present

## 2017-03-20 DIAGNOSIS — Z1159 Encounter for screening for other viral diseases: Secondary | ICD-10-CM | POA: Diagnosis not present

## 2017-03-20 DIAGNOSIS — R194 Change in bowel habit: Secondary | ICD-10-CM | POA: Diagnosis not present

## 2017-03-20 DIAGNOSIS — E785 Hyperlipidemia, unspecified: Secondary | ICD-10-CM | POA: Diagnosis not present

## 2017-03-20 DIAGNOSIS — E1121 Type 2 diabetes mellitus with diabetic nephropathy: Secondary | ICD-10-CM

## 2017-03-20 LAB — POCT UA - MICROALBUMIN: Microalbumin Ur, POC: NEGATIVE mg/L

## 2017-03-20 NOTE — Progress Notes (Signed)
Patient: Jacob Herrera Male    DOB: March 02, 1951   66 y.o.   MRN: [id number] Visit Date: 03/20/2017  Today's Provider: Lelon Huh, MD   Chief Complaint  Patient presents with  . Follow-up  . Diabetes  . Hypertension  . Hyperlipidemia   Subjective:    HPI   Diabetes Mellitus Type II, Follow-up:   Lab Results  Component Value Date   HGBA1C 6.4 12/12/2016   HGBA1C 6.7 09/06/2016   HGBA1C 6.7 06/08/2016   Last seen for diabetes 3 months ago.  Management since then includes; no changes. He reports good compliance with treatment. He is not having side effects. none Current symptoms include none and have been unchanged. Home blood sugar records: fasting range: 140  Episodes of hypoglycemia? no   Current Insulin Regimen:  Most Recent Eye Exam: 01/11/2017 Weight trend: stable Prior visit with dietician: no  Current exercise: none  ----------------------------------------------------------------   Hypertension, follow-up:  BP Readings from Last 3 Encounters:  03/20/17 (!) 150/84  03/06/17 (!) 170/88  12/12/16 140/74    He was last seen for hypertension 3 months ago.  BP at that visit was 140/74. Management since that visit includes; no changes.He reports good compliance with treatment. He is not having side effects. none He is not exercising. He is adherent to low salt diet.   Outside blood pressures are blood pressure has been elevated lately. He is experiencing none.  Patient denies none.   Cardiovascular risk factors include diabetes mellitus.  Use of agents associated with hypertension: none.   ----------------------------------------------------------------    Lipid/Cholesterol, Follow-up:   Last seen for this 3 months ago.  Management since that visit includes; no changes, will check lipid panel at 3 month follow-up.  Last Lipid Panel:    Component Value Date/Time   CHOL 183 11/23/2016 1053   TRIG 310 (H) 11/23/2016 1053   HDL 34  (L) 11/23/2016 1053   CHOLHDL 5.4 (H) 11/23/2016 1053   LDLCALC 87 11/23/2016 1053    He reports good compliance with treatment. He is not having side effects. none  Wt Readings from Last 3 Encounters:  03/20/17 284 lb (128.8 kg)  03/06/17 283 lb (128.4 kg)  12/12/16 283 lb (128.4 kg)    ----------------------------------------------------------------   Chronic bilateral low back pain without sciatica He continues with oxycodone 5mg  which remains relatively effective, but well tolerated.   States he has been having more problems with bowel movement the last several months, has been taking up to 10 x 250mg  docusate in addition to fiber supplement and Miralax in order to have normal bowel movements, then starting having blood stool   He is also here to follow up on elevated prolactin level which is thought to be the cause of gynecomastia. Since the his cardiologist has discontinued spironolactone.    No Known Allergies   Current Outpatient Medications:  .  albuterol (PROVENTIL) (2.5 MG/3ML) 0.083% nebulizer solution, Take 3 mLs (2.5 mg total) by nebulization every 4 (four) hours. And PRN, Disp: 75 mL, Rfl: 12 .  amLODipine (NORVASC) 10 MG tablet, Take 1 tablet (10 mg total) by mouth daily., Disp: 30 tablet, Rfl: 1 .  aspirin 81 MG tablet, Take 81 mg by mouth daily., Disp: , Rfl:  .  atorvastatin (LIPITOR) 80 MG tablet, Take 1 tablet (80 mg total) by mouth at bedtime., Disp: 90 tablet, Rfl: 3 .  BD PEN NEEDLE NANO U/F 32G X 4 MM MISC, USE DAILY AS  DIRECTED, Disp: 100 each, Rfl: 4 .  budesonide-formoterol (SYMBICORT) 80-4.5 MCG/ACT inhaler, Inhale 2 puffs into the lungs 2 (two) times daily., Disp: 3 Inhaler, Rfl: 2 .  BYDUREON 2 MG PEN, INJECT 2 MG INTO THE SKIN ONCE A WEEK, Disp: 4 each, Rfl: 11 .  carvedilol (COREG) 12.5 MG tablet, Take 12.5 mg by mouth 2 (two) times daily with a meal., Disp: , Rfl:  .  clopidogrel (PLAVIX) 75 MG tablet, Take 1 tablet (75 mg total) by mouth  daily., Disp: 30 tablet, Rfl: 11 .  Docusate Calcium (STOOL SOFTENER PO), Take by mouth., Disp: , Rfl:  .  FIBER PO, Take by mouth., Disp: , Rfl:  .  finasteride (PROSCAR) 5 MG tablet, Take 1 tablet (5 mg total) by mouth daily., Disp: 30 tablet, Rfl: 11 .  fluticasone (FLONASE) 50 MCG/ACT nasal spray, Place 2 sprays into both nostrils daily., Disp: 1 g, Rfl: 2 .  furosemide (LASIX) 40 MG tablet, Take 40 mg by mouth 2 (two) times daily. , Disp: , Rfl:  .  glipiZIDE (GLUCOTROL) 5 MG tablet, Take 1 tablet (5 mg total) by mouth daily before breakfast., Disp: 90 tablet, Rfl: 2 .  hydrALAZINE (APRESOLINE) 10 MG tablet, Take 10 mg by mouth 2 (two) times daily., Disp: , Rfl:  .  insulin aspart (NOVOLOG FLEXPEN) 100 UNIT/ML FlexPen, INJECT UP TO 12 UNITS UNITS SUBCUTANEOUS FOUR TIMES DAILY BEFORE MEALS, Disp: 1500 mL, Rfl: 12 .  LANTUS SOLOSTAR 100 UNIT/ML Solostar Pen, TITRATE UP TO 80 UNITS, ONCE A DAY, Disp: 20 pen, Rfl: 3 .  levothyroxine (SYNTHROID, LEVOTHROID) 125 MCG tablet, TAKE 1 TABLET (125 MCG TOTAL) BY MOUTH DAILY., Disp: 30 tablet, Rfl: 11 .  losartan (COZAAR) 100 MG tablet, Take 1 tablet (100 mg total) by mouth daily., Disp: 30 tablet, Rfl: 1 .  Lysine 500 MG CAPS, Take 1 capsule by mouth daily., Disp: , Rfl:  .  ONE TOUCH ULTRA TEST test strip, USE ONE STRIP ONCE DAILY, Disp: 100 each, Rfl: 4 .  oxyCODONE (OXY IR/ROXICODONE) 5 MG immediate release tablet, Take 1 tablet (5 mg total) by mouth daily as needed for severe pain., Disp: 30 tablet, Rfl: 0 .  pantoprazole (PROTONIX) 40 MG tablet, Take 1 tablet (40 mg total) by mouth daily before breakfast., Disp: 30 tablet, Rfl: 3 .  tamsulosin (FLOMAX) 0.4 MG CAPS capsule, Take 1 capsule (0.4 mg total) by mouth daily. (Patient taking differently: Take 0.8 mg by mouth 2 (two) times daily. ), Disp: 30 capsule, Rfl: 3  Review of Systems  Constitutional: Negative for appetite change, chills and fever.  Respiratory: Negative for chest tightness,  shortness of breath and wheezing.   Cardiovascular: Negative for chest pain and palpitations.  Gastrointestinal: Negative for abdominal pain, nausea and vomiting.    Social History   Tobacco Use  . Smoking status: Former Smoker    Packs/day: 1.00    Years: 25.00    Pack years: 25.00    Types: Cigarettes    Last attempt to quit: 05/29/1988    Years since quitting: 28.8  . Smokeless tobacco: Current User    Types: Chew  . Tobacco comment: chews 1 bag of loose leaf chew/week  Substance Use Topics  . Alcohol use: No    Alcohol/week: 0.0 oz   Objective:   BP (!) 150/84 (BP Location: Right Arm, Patient Position: Sitting, Cuff Size: Large)   Pulse 79   Temp 97.9 F (36.6 C) (Oral)   Resp 18  Ht 5\' 8"  (1.727 m)   Wt 284 lb (128.8 kg)   SpO2 96%   BMI 43.18 kg/m  Vitals:   03/20/17 1403  BP: (!) 150/84  Pulse: 79  Resp: 18  Temp: 97.9 F (36.6 C)  TempSrc: Oral  SpO2: 96%  Weight: 284 lb (128.8 kg)  Height: 5\' 8"  (1.727 m)     Physical Exam   General Appearance:    Alert, cooperative, no distress  Eyes:    PERRL, conjunctiva/corneas clear, EOM's intact       Lungs:     Clear to auscultation bilaterally, respirations unlabored  Heart:    Regular rate and rhythm  Neurologic:   Awake, alert, oriented x 3. No apparent focal neurological           defect.           Assessment & Plan:     1. Diabetes mellitus with nephropathy ([place])  - POCT UA - Microalbumin - Hemoglobin A1c  2. Bowel habit changes  - Ambulatory referral to Gastroenterology  3. Need for hepatitis C screening test  - Hepatitis C antibody  4. 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  - Prolactin - Testosterone  5. Hyperlipidemia, unspecified hyperlipidemia type He is tolerating atorvastatin well with no adverse effects.   - Lipid panel  6. Need for influenza vaccination  - Flu vaccine HIGH DOSE PF       Lelon Huh, MD    [facility]

## 2017-03-21 LAB — PROLACTIN: Prolactin: 8.2 ng/mL (ref 2.0–18.0)

## 2017-03-21 LAB — HEPATITIS C ANTIBODY
Hepatitis C Ab: NONREACTIVE
SIGNAL TO CUT-OFF: 0.07 (ref <1.00)

## 2017-03-21 LAB — HEMOGLOBIN A1C
Hgb A1c MFr Bld: 6.3 % of total Hgb — ABNORMAL HIGH (ref <5.7)
Mean Plasma Glucose: 134 (calc)
eAG (mmol/L): 7.4 (calc)

## 2017-03-21 LAB — LIPID PANEL
Cholesterol: 152 mg/dL (ref <200)
HDL: 35 mg/dL — ABNORMAL LOW (ref >40)
LDL Cholesterol (Calc): 83 mg/dL (calc)
Non-HDL Cholesterol (Calc): 117 mg/dL (calc) (ref <130)
Total CHOL/HDL Ratio: 4.3 (calc) (ref <5.0)
Triglycerides: 260 mg/dL — ABNORMAL HIGH (ref <150)

## 2017-03-21 LAB — TESTOSTERONE: Testosterone: 332 ng/dL (ref 250–827)

## 2017-04-01 DIAGNOSIS — I5022 Chronic systolic (congestive) heart failure: Secondary | ICD-10-CM | POA: Diagnosis not present

## 2017-04-04 DIAGNOSIS — Z85828 Personal history of other malignant neoplasm of skin: Secondary | ICD-10-CM | POA: Diagnosis not present

## 2017-04-04 DIAGNOSIS — L570 Actinic keratosis: Secondary | ICD-10-CM | POA: Diagnosis not present

## 2017-04-04 DIAGNOSIS — Z85820 Personal history of malignant melanoma of skin: Secondary | ICD-10-CM | POA: Diagnosis not present

## 2017-04-04 DIAGNOSIS — D2261 Melanocytic nevi of right upper limb, including shoulder: Secondary | ICD-10-CM | POA: Diagnosis not present

## 2017-04-04 DIAGNOSIS — C44519 Basal cell carcinoma of skin of other part of trunk: Secondary | ICD-10-CM | POA: Diagnosis not present

## 2017-04-04 DIAGNOSIS — D225 Melanocytic nevi of trunk: Secondary | ICD-10-CM | POA: Diagnosis not present

## 2017-04-04 DIAGNOSIS — D485 Neoplasm of uncertain behavior of skin: Secondary | ICD-10-CM | POA: Diagnosis not present

## 2017-04-04 DIAGNOSIS — X32XXXA Exposure to sunlight, initial encounter: Secondary | ICD-10-CM | POA: Diagnosis not present

## 2017-04-13 ENCOUNTER — Other Ambulatory Visit: Payer: Self-pay | Admitting: *Deleted

## 2017-04-13 ENCOUNTER — Other Ambulatory Visit: Payer: Self-pay | Admitting: Family Medicine

## 2017-04-13 MED ORDER — BUDESONIDE-FORMOTEROL FUMARATE 80-4.5 MCG/ACT IN AERO: 2.0000 | INHALATION_SPRAY | Freq: Two times a day (BID) | RESPIRATORY_TRACT | 2 refills | Status: DC

## 2017-04-16 ENCOUNTER — Other Ambulatory Visit: Payer: Self-pay

## 2017-04-16 MED ORDER — FLUTICASONE PROPIONATE 50 MCG/ACT NA SUSP: 2.0000 | Freq: Every day | NASAL | 2 refills | Status: DC

## 2017-04-16 NOTE — Telephone Encounter (Signed)
Pharmacy requesting refills. Thanks!  

## 2017-04-18 DIAGNOSIS — J45909 Unspecified asthma, uncomplicated: Secondary | ICD-10-CM | POA: Diagnosis not present

## 2017-04-18 DIAGNOSIS — I5022 Chronic systolic (congestive) heart failure: Secondary | ICD-10-CM | POA: Diagnosis not present

## 2017-04-18 DIAGNOSIS — G4733 Obstructive sleep apnea (adult) (pediatric): Secondary | ICD-10-CM | POA: Diagnosis not present

## 2017-04-18 DIAGNOSIS — I5032 Chronic diastolic (congestive) heart failure: Secondary | ICD-10-CM | POA: Diagnosis not present

## 2017-04-26 DIAGNOSIS — G4733 Obstructive sleep apnea (adult) (pediatric): Secondary | ICD-10-CM | POA: Diagnosis not present

## 2017-04-26 DIAGNOSIS — J45909 Unspecified asthma, uncomplicated: Secondary | ICD-10-CM | POA: Diagnosis not present

## 2017-04-26 DIAGNOSIS — I5022 Chronic systolic (congestive) heart failure: Secondary | ICD-10-CM | POA: Diagnosis not present

## 2017-04-26 DIAGNOSIS — I5032 Chronic diastolic (congestive) heart failure: Secondary | ICD-10-CM | POA: Diagnosis not present

## 2017-05-02 DIAGNOSIS — I5022 Chronic systolic (congestive) heart failure: Secondary | ICD-10-CM | POA: Diagnosis not present

## 2017-05-08 DIAGNOSIS — Z86010 Personal history of colonic polyps: Secondary | ICD-10-CM | POA: Diagnosis not present

## 2017-05-08 DIAGNOSIS — K219 Gastro-esophageal reflux disease without esophagitis: Secondary | ICD-10-CM | POA: Diagnosis not present

## 2017-05-08 DIAGNOSIS — K5909 Other constipation: Secondary | ICD-10-CM | POA: Diagnosis not present

## 2017-05-28 ENCOUNTER — Other Ambulatory Visit: Payer: Self-pay | Admitting: Family Medicine

## 2017-05-28 DIAGNOSIS — C44519 Basal cell carcinoma of skin of other part of trunk: Secondary | ICD-10-CM | POA: Diagnosis not present

## 2017-06-01 ENCOUNTER — Telehealth: Payer: Self-pay | Admitting: Internal Medicine

## 2017-06-01 NOTE — Telephone Encounter (Signed)
Randall from [place] is calling  Calling in regards to diagnosis for oxygen request Please call to discuss [phone]

## 2017-06-01 NOTE — Telephone Encounter (Signed)
Rosine Door and he states he found it in the hub and he apologized for not calling us back when he found it. Nothing further needed.

## 2017-06-02 DIAGNOSIS — I5022 Chronic systolic (congestive) heart failure: Secondary | ICD-10-CM | POA: Diagnosis not present

## 2017-06-18 DIAGNOSIS — G5601 Carpal tunnel syndrome, right upper limb: Secondary | ICD-10-CM | POA: Diagnosis not present

## 2017-06-25 DIAGNOSIS — I129 Hypertensive chronic kidney disease with stage 1 through stage 4 chronic kidney disease, or unspecified chronic kidney disease: Secondary | ICD-10-CM | POA: Diagnosis not present

## 2017-06-25 DIAGNOSIS — E872 Acidosis: Secondary | ICD-10-CM | POA: Diagnosis not present

## 2017-06-25 DIAGNOSIS — E875 Hyperkalemia: Secondary | ICD-10-CM | POA: Diagnosis not present

## 2017-06-25 DIAGNOSIS — N183 Chronic kidney disease, stage 3 (moderate): Secondary | ICD-10-CM | POA: Diagnosis not present

## 2017-06-25 DIAGNOSIS — E1122 Type 2 diabetes mellitus with diabetic chronic kidney disease: Secondary | ICD-10-CM | POA: Diagnosis not present

## 2017-06-25 DIAGNOSIS — R809 Proteinuria, unspecified: Secondary | ICD-10-CM | POA: Diagnosis not present

## 2017-06-30 DIAGNOSIS — I5022 Chronic systolic (congestive) heart failure: Secondary | ICD-10-CM | POA: Diagnosis not present

## 2017-07-12 ENCOUNTER — Other Ambulatory Visit: Payer: Self-pay | Admitting: Family Medicine

## 2017-07-12 DIAGNOSIS — IMO0002 Reserved for concepts with insufficient information to code with codable children: Secondary | ICD-10-CM

## 2017-07-12 DIAGNOSIS — E1165 Type 2 diabetes mellitus with hyperglycemia: Secondary | ICD-10-CM

## 2017-07-12 DIAGNOSIS — E118 Type 2 diabetes mellitus with unspecified complications: Principal | ICD-10-CM

## 2017-07-12 DIAGNOSIS — Z794 Long term (current) use of insulin: Principal | ICD-10-CM

## 2017-07-19 DIAGNOSIS — I10 Essential (primary) hypertension: Secondary | ICD-10-CM | POA: Diagnosis not present

## 2017-07-19 DIAGNOSIS — E782 Mixed hyperlipidemia: Secondary | ICD-10-CM | POA: Diagnosis not present

## 2017-07-19 DIAGNOSIS — I25110 Atherosclerotic heart disease of native coronary artery with unstable angina pectoris: Secondary | ICD-10-CM | POA: Diagnosis not present

## 2017-07-19 DIAGNOSIS — I5032 Chronic diastolic (congestive) heart failure: Secondary | ICD-10-CM | POA: Diagnosis not present

## 2017-07-26 ENCOUNTER — Ambulatory Visit: Payer: PPO

## 2017-07-29 IMAGING — CR DG CHEST 2V
1 series · 2 of 2 positions shown · non-contrast
Comparison: Portable chest x-ray March 2015.

CLINICAL DATA: [REDACTED] history of shortness of breath; history of
asthma, previous episodes of pneumonia ; coronary stent placement in
[DATE], former smoker.

EXAM:
CHEST  2 VIEW

[Series 1: dg chest 2 view · 0.14mm/px · 2 of 2 slices shown]
[im 1/2]
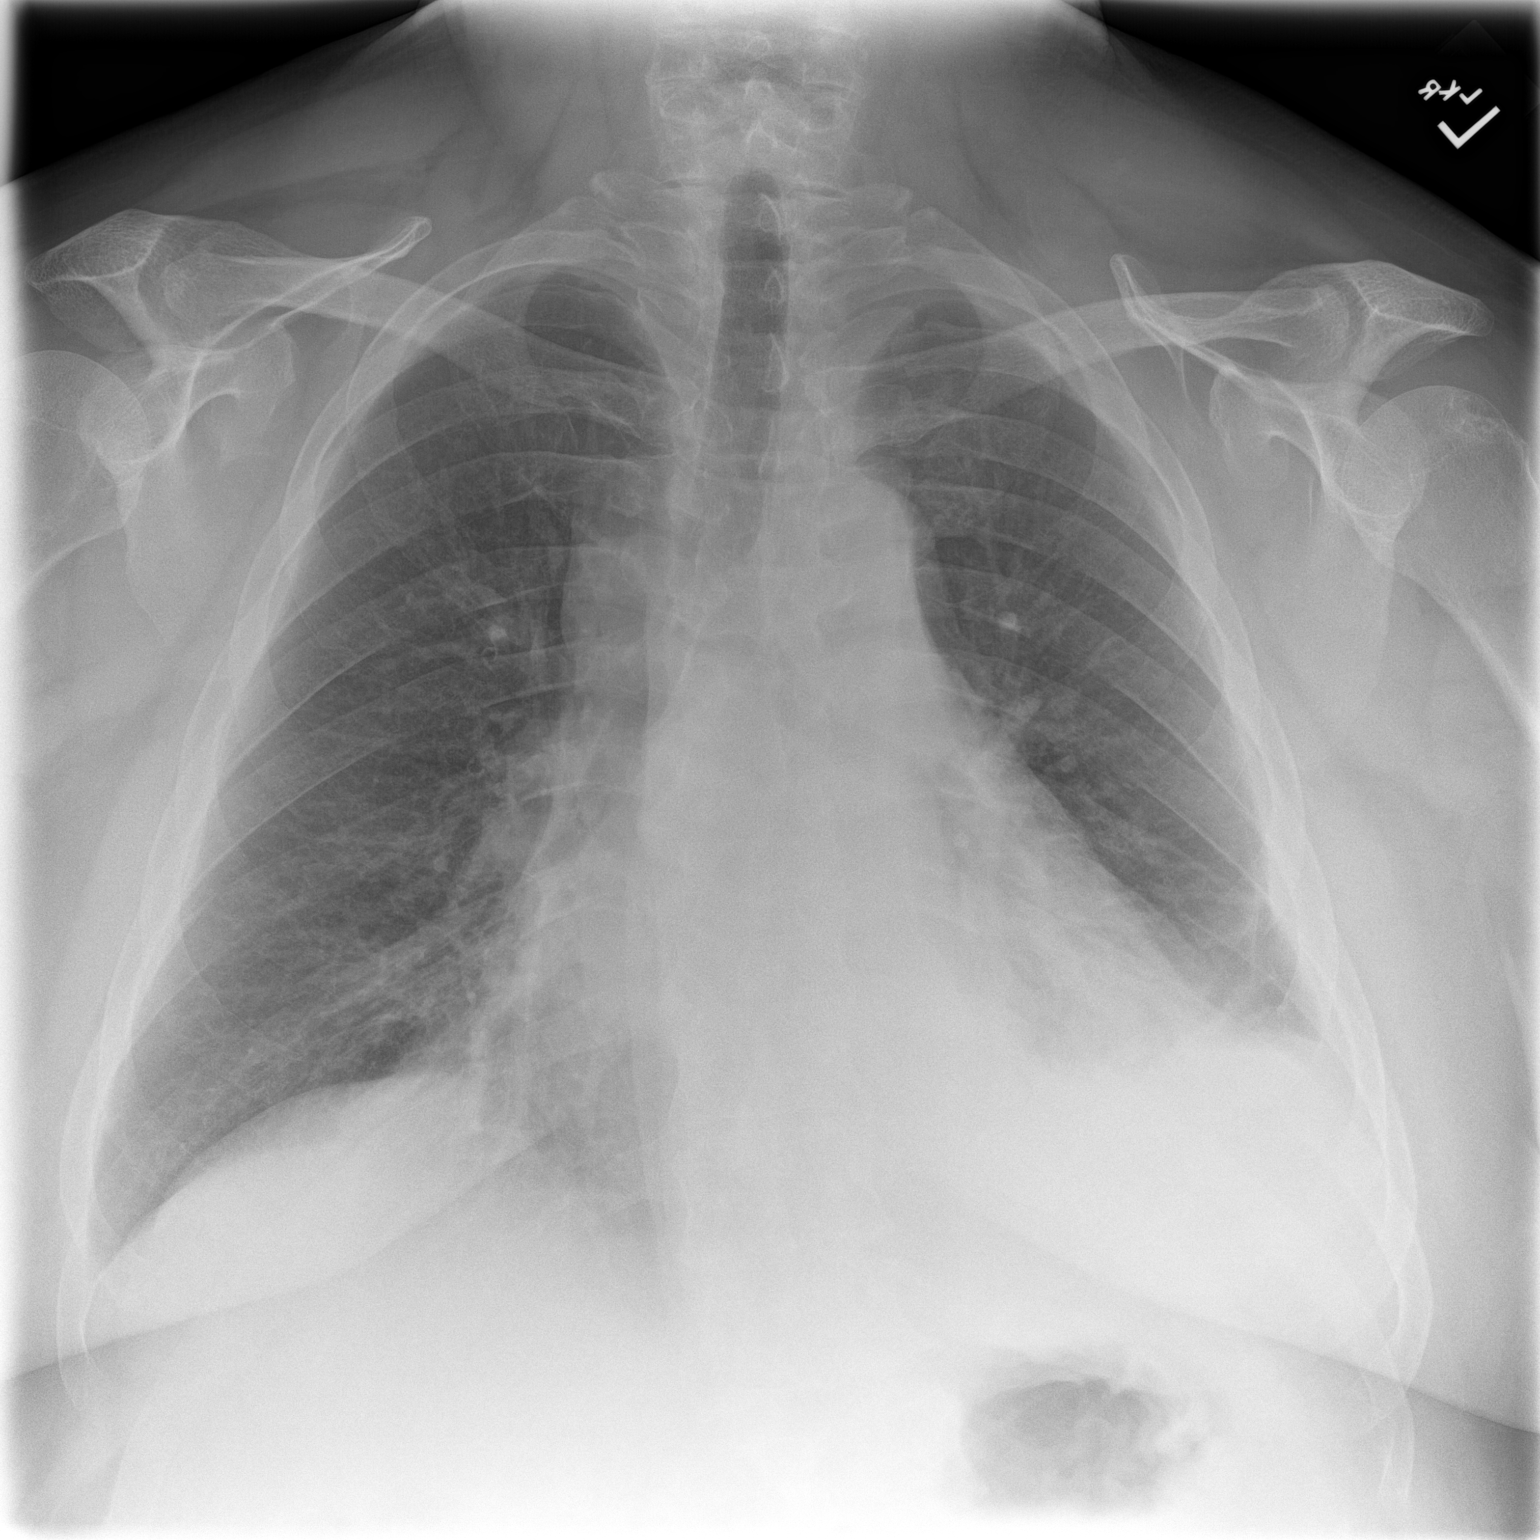
[im 2/2]
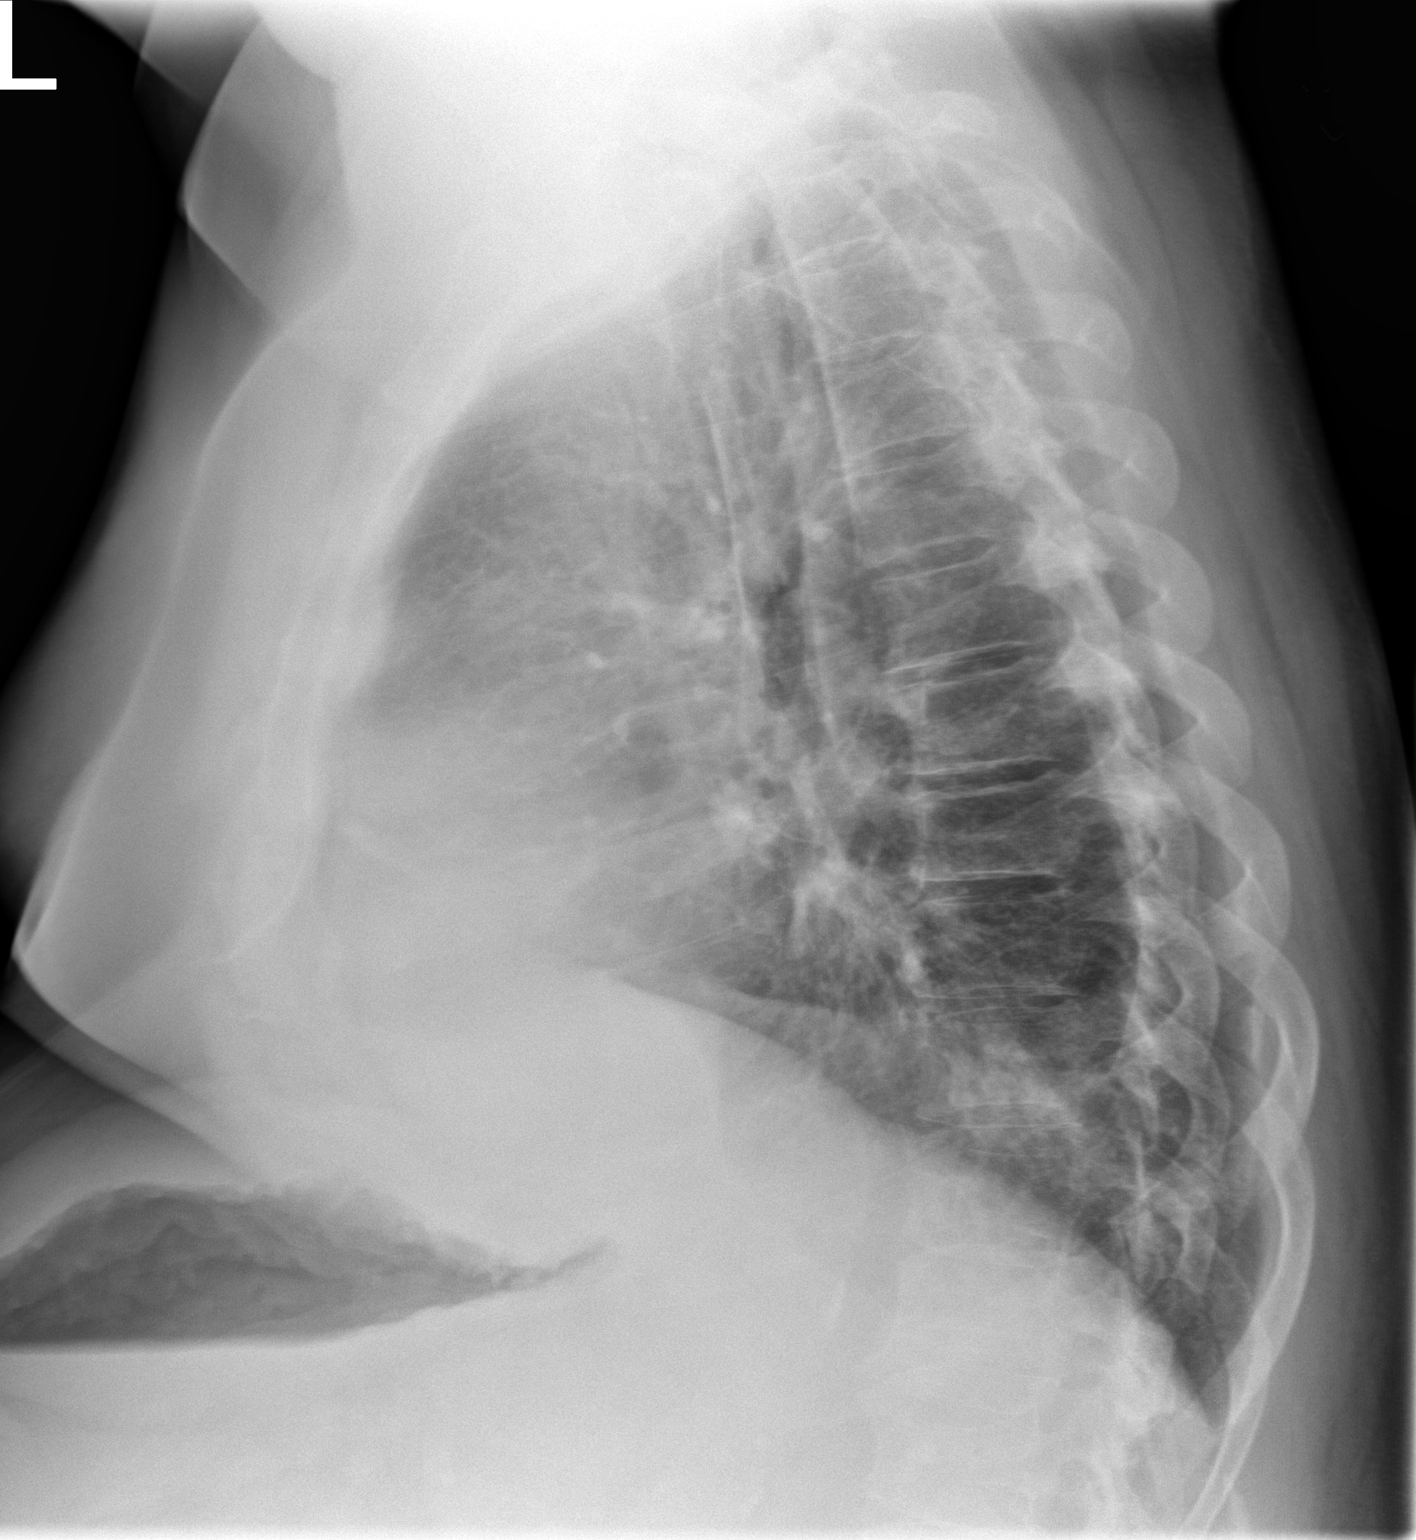

[2 of 2 positions shown; findings below may reference images not displayed]

FINDINGS: The lungs are adequately inflated. There are coarse lung markings in
the left lower lobe. A trace of pleural fluid on the left is
suspected. The cardiac silhouette is enlarged. The pulmonary
vascularity is not engorged. The trachea is midline. The bony thorax
exhibits no acute abnormality.
IMPRESSION: Left lower lobe pneumonia.  Underlying COPD-reactive airway disease.

Followup PA and lateral chest X-ray is recommended in 3-4 weeks
following trial of antibiotic therapy to ensure resolution and
exclude underlying malignancy.

## 2017-07-31 DIAGNOSIS — I5022 Chronic systolic (congestive) heart failure: Secondary | ICD-10-CM | POA: Diagnosis not present

## 2017-08-16 DIAGNOSIS — E782 Mixed hyperlipidemia: Secondary | ICD-10-CM | POA: Diagnosis not present

## 2017-08-16 DIAGNOSIS — I10 Essential (primary) hypertension: Secondary | ICD-10-CM | POA: Diagnosis not present

## 2017-08-16 DIAGNOSIS — R600 Localized edema: Secondary | ICD-10-CM | POA: Diagnosis not present

## 2017-08-16 DIAGNOSIS — R0602 Shortness of breath: Secondary | ICD-10-CM | POA: Diagnosis not present

## 2017-08-16 DIAGNOSIS — I5032 Chronic diastolic (congestive) heart failure: Secondary | ICD-10-CM | POA: Diagnosis not present

## 2017-08-16 DIAGNOSIS — I25110 Atherosclerotic heart disease of native coronary artery with unstable angina pectoris: Secondary | ICD-10-CM | POA: Diagnosis not present

## 2017-08-17 ENCOUNTER — Other Ambulatory Visit: Payer: Self-pay | Admitting: Cardiology

## 2017-08-17 DIAGNOSIS — R600 Localized edema: Secondary | ICD-10-CM

## 2017-08-20 ENCOUNTER — Encounter: Payer: Self-pay | Admitting: Family Medicine

## 2017-08-20 ENCOUNTER — Ambulatory Visit
Admission: RE | Admit: 2017-08-20 | Discharge: 2017-08-20 | Disposition: A | Discharge status: Home / Self Care | Payer: PPO | Source: Ambulatory Visit | Attending: Cardiology | Admitting: Cardiology

## 2017-08-20 ENCOUNTER — Ambulatory Visit: Payer: PPO | Admitting: Family Medicine

## 2017-08-20 VITALS — BP 152/84 | HR 73 | Temp 98.0°F | Resp 18 | Ht 68.0 in | Wt 279.0 lb

## 2017-08-20 DIAGNOSIS — E1121 Type 2 diabetes mellitus with diabetic nephropathy: Secondary | ICD-10-CM | POA: Diagnosis not present

## 2017-08-20 DIAGNOSIS — M7989 Other specified soft tissue disorders: Secondary | ICD-10-CM | POA: Diagnosis not present

## 2017-08-20 DIAGNOSIS — I10 Essential (primary) hypertension: Secondary | ICD-10-CM | POA: Diagnosis not present

## 2017-08-20 DIAGNOSIS — R600 Localized edema: Secondary | ICD-10-CM | POA: Diagnosis not present

## 2017-08-20 LAB — POCT GLYCOSYLATED HEMOGLOBIN (HGB A1C)
Est. average glucose Bld gHb Est-mCnc: 137
Hemoglobin A1C: 6.4

## 2017-08-20 NOTE — Progress Notes (Signed)
Patient: Jacob Herrera Male    DOB: 1950-07-31   67 y.o.   MRN: [id number] Visit Date: 08/20/2017  Today's Provider: Lelon Huh, MD   Chief Complaint  Patient presents with  . Follow-up  . Diabetes  . Hypertension   Subjective:    HPI   Diabetes Mellitus Type II, Follow-up:   Lab Results  Component Value Date   HGBA1C 6.3 (H) 03/20/2017   HGBA1C 6.4 12/12/2016   HGBA1C 6.7 09/06/2016   Last seen for diabetes 5 months ago.  Management since then includes; no changes. He reports good compliance with treatment. He is not having side effects. none Current symptoms include none and have been unchanged. Home blood sugar records: fasting range: 120-160  Episodes of hypoglycemia? none   Current Insulin Regimen: 40-55 units Lantus daily and Novalog on sliding scale.  Most Recent Eye Exam: 01/11/2017 Weight trend: stable Prior visit with dietician: no Current diet: well balanced Current exercise: none  ----------------------------------------------------------------   Hypertension, follow-up:  BP Readings from Last 3 Encounters:  08/20/17 (!) 152/84  03/20/17 (!) 150/84  03/06/17 (!) 170/88    He was last seen for hypertension 8 months ago.  BP at that visit was 150/84. Management since that visit includes; no changes.He reports good compliance with treatment. He is not having side effects. Swelling in legs and feet He is not exercising. He is adherent to low salt diet.   Outside blood pressures are 180/58. He is experiencing none.  Patient denies none.   Cardiovascular risk factors include diabetes mellitus.  Use of agents associated with hypertension: none.   ----------------------------------------------------------------    No Known Allergies   Current Outpatient Medications:  .  albuterol (PROVENTIL) (2.5 MG/3ML) 0.083% nebulizer solution, Take 3 mLs (2.5 mg total) by nebulization every 4 (four) hours. And PRN, Disp: 75 mL, Rfl: 12 .   amLODipine (NORVASC) 10 MG tablet, Take 1 tablet (10 mg total) by mouth daily., Disp: 30 tablet, Rfl: 1 .  aspirin 81 MG tablet, Take 81 mg by mouth daily., Disp: , Rfl:  .  atorvastatin (LIPITOR) 80 MG tablet, Take 1 tablet (80 mg total) by mouth at bedtime., Disp: 90 tablet, Rfl: 3 .  BD PEN NEEDLE NANO U/F 32G X 4 MM MISC, USE DAILY AS DIRECTED, Disp: 100 each, Rfl: 3 .  budesonide-formoterol (SYMBICORT) 80-4.5 MCG/ACT inhaler, Inhale 2 puffs into the lungs 2 (two) times daily., Disp: 3 Inhaler, Rfl: 2 .  BYDUREON 2 MG PEN, INJECT 2 MG INTO THE SKIN ONCE A WEEK, Disp: 4 each, Rfl: 11 .  carvedilol (COREG) 25 MG tablet, Take 25 mg by mouth 2 (two) times daily., Disp: , Rfl:  .  cloNIDine (CATAPRES) 0.2 MG tablet, Take 0.2 mg by mouth 2 (two) times daily., Disp: , Rfl:  .  clopidogrel (PLAVIX) 75 MG tablet, Take 1 tablet (75 mg total) by mouth daily., Disp: 30 tablet, Rfl: 11 .  Docusate Calcium (STOOL SOFTENER PO), Take by mouth., Disp: , Rfl:  .  finasteride (PROSCAR) 5 MG tablet, Take 1 tablet (5 mg total) by mouth daily., Disp: 30 tablet, Rfl: 11 .  fluticasone (FLONASE) 50 MCG/ACT nasal spray, Place 2 sprays into both nostrils daily., Disp: 16 g, Rfl: 2 .  furosemide (LASIX) 40 MG tablet, Take 40 mg by mouth 2 (two) times daily. , Disp: , Rfl:  .  glipiZIDE (GLUCOTROL) 5 MG tablet, TAKE 1 TABLET (5 MG TOTAL) BY MOUTH DAILY BEFORE  BREAKFAST., Disp: 90 tablet, Rfl: 3 .  hydrALAZINE (APRESOLINE) 25 MG tablet, Take 25 mg by mouth 2 (two) times daily., Disp: , Rfl:  .  insulin aspart (NOVOLOG FLEXPEN) 100 UNIT/ML FlexPen, INJECT UP TO 12 UNITS UNITS SUBCUTANEOUS FOUR TIMES DAILY BEFORE MEALS, Disp: 1500 mL, Rfl: 12 .  LANTUS SOLOSTAR 100 UNIT/ML Solostar Pen, TITRATE UP TO 80 UNITS, ONCE A DAY, Disp: 20 pen, Rfl: 3 .  levothyroxine (SYNTHROID, LEVOTHROID) 125 MCG tablet, TAKE 1 TABLET (125 MCG TOTAL) BY MOUTH DAILY., Disp: 30 tablet, Rfl: 11 .  losartan (COZAAR) 100 MG tablet, Take 1 tablet (100  mg total) by mouth daily., Disp: 30 tablet, Rfl: 1 .  Lysine 500 MG CAPS, Take 1 capsule by mouth daily., Disp: , Rfl:  .  ONE TOUCH ULTRA TEST test strip, USE ONE STRIP ONCE DAILY, Disp: 100 each, Rfl: 4 .  oxyCODONE (OXY IR/ROXICODONE) 5 MG immediate release tablet, Take 1 tablet (5 mg total) by mouth daily as needed for severe pain., Disp: 30 tablet, Rfl: 0 .  pantoprazole (PROTONIX) 40 MG tablet, Take 1 tablet (40 mg total) by mouth daily before breakfast., Disp: 30 tablet, Rfl: 3 .  tamsulosin (FLOMAX) 0.4 MG CAPS capsule, Take 2 capsules (0.8 mg total) by mouth daily Disp: 30 capsule, Rfl: 3  Review of Systems  Constitutional: Negative for appetite change, chills and fever.  Respiratory: Negative for chest tightness, shortness of breath and wheezing.   Cardiovascular: Negative for chest pain and palpitations.  Gastrointestinal: Negative for abdominal pain, nausea and vomiting.    Social History   Tobacco Use  . Smoking status: Former Smoker    Packs/day: 1.00    Years: 25.00    Pack years: 25.00    Types: Cigarettes    Last attempt to quit: 05/29/1988    Years since quitting: 29.2  . Smokeless tobacco: Current User    Types: Chew  . Tobacco comment: chews 1 bag of loose leaf chew/week  Substance Use Topics  . Alcohol use: No    Alcohol/week: 0.0 oz   Objective:   BP (!) 152/84 (BP Location: Right Arm, Patient Position: Sitting, Cuff Size: Large)   Pulse 73   Temp 98 F (36.7 C) (Oral)   Resp 18   Ht 5\' 8"  (1.727 m)   Wt 279 lb (126.6 kg)   SpO2 96%   BMI 42.42 kg/m  Vitals:   08/20/17 1402  BP: (!) 152/84  Pulse: 73  Resp: 18  Temp: 98 F (36.7 C)  TempSrc: Oral  SpO2: 96%  Weight: 279 lb (126.6 kg)  Height: 5\' 8"  (1.727 m)     Physical Exam  General Appearance:    Alert, cooperative, no distress, obese  Eyes:    PERRL, conjunctiva/corneas clear, EOM's intact       Lungs:     Clear to auscultation bilaterally, respirations unlabored  Heart:    Regular  rate and rhythm  Neurologic:   Awake, alert, oriented x 3. No apparent focal neurological           defect.       Results for orders placed or performed in visit on 08/20/17  POCT glycosylated hemoglobin (Hb A1C)  Result Value Ref Range   Hemoglobin A1C 6.4    Est. average glucose Bld gHb Est-mCnc 137        Assessment & Plan:     1. Diabetes mellitus with nephropathy ([place]) Well controlled. Continue current medications.  POCT glycosylated hemoglobin (Hb A1C)  2. Essential hypertension Stable. Continue current medications.   Return in about 4 months (around 12/20/2017) for diabetes.        Lelon Huh, MD  [facility]

## 2017-08-24 ENCOUNTER — Encounter: Payer: Self-pay | Admitting: Urology

## 2017-08-24 ENCOUNTER — Ambulatory Visit: Payer: PPO | Admitting: Urology

## 2017-08-24 VITALS — BP 171/92 | HR 95 | Ht 68.0 in | Wt 279.1 lb

## 2017-08-24 DIAGNOSIS — N400 Enlarged prostate without lower urinary tract symptoms: Secondary | ICD-10-CM

## 2017-08-24 DIAGNOSIS — Z125 Encounter for screening for malignant neoplasm of prostate: Secondary | ICD-10-CM | POA: Diagnosis not present

## 2017-08-24 NOTE — Progress Notes (Signed)
08/24/2017 3:31 PM   Jacob Herrera 18-Nov-1950 [id number]  Referring provider: [name], MD [address] [place], [place] [address]  Chief Complaint  Patient presents with  . Benign Prostatic Hypertrophy    HPI: The patient is a 67 year old gentleman with multiple medical comorbidities including CKD Stage IV  who presents for annual follow-up for his BPH.  1.  BPH The patient is currently on Flomax 0.8 mg and finasteride.  He feels that these medications control his urination well.  He has nocturia x1.  He has a good stream.  He denies daytime frequency.  He feels that he empties his bladder.  He does note urgency with occasional urge incontinence.  He is not terribly bothered by this.  He is overall happy with his urinary quality of life.  He also has sleep apnea and is on a CPAP machine.    PMH: Past Medical History:  Diagnosis Date  . History of chicken pox   . Hx of skin cancer, basal cell   . Kidney disease   . Scoliosis   . Thyrotoxicosis    2010    Surgical History: Past Surgical History:  Procedure Laterality Date  . CARDIAC CATHETERIZATION N/A 03/31/2015   Procedure: Left Heart Cath and Coronary Angiography;  Surgeon: Teodoro Spray, MD;  Location: [facility];  Service: Cardiovascular;  Laterality: N/A;  . CARDIAC CATHETERIZATION N/A 03/31/2015   Procedure: Coronary Stent Intervention;  Surgeon: Teodoro Spray, MD;  Location: [facility];  Service: Cardiovascular;  Laterality: N/A;  . CARDIAC CATHETERIZATION N/A 03/31/2015   Procedure: Coronary Stent Intervention;  Surgeon: Yolonda Kida, MD;  Location: [facility];  Service: Cardiovascular;  Laterality: N/A;  . NASAL SINUS SURGERY  2005  . TONSILLECTOMY AND ADENOIDECTOMY  1950    Home Medications:  Allergies as of 08/24/2017   No Known Allergies     Medication List        Accurate as of 08/24/17  3:31 PM. Always use your most recent med list.            albuterol (2.5 MG/3ML) 0.083% nebulizer solution Commonly known as:  PROVENTIL Take 3 mLs (2.5 mg total) by nebulization every 4 (four) hours. And PRN   amLODipine 10 MG tablet Commonly known as:  NORVASC Take 1 tablet (10 mg total) by mouth daily.   aspirin 81 MG tablet Take 81 mg by mouth daily.   atorvastatin 80 MG tablet Commonly known as:  LIPITOR Take 1 tablet (80 mg total) by mouth at bedtime.   BD PEN NEEDLE NANO U/F 32G X 4 MM Misc Generic drug:  Insulin Pen Needle USE DAILY AS DIRECTED   budesonide-formoterol 80-4.5 MCG/ACT inhaler Commonly known as:  SYMBICORT Inhale 2 puffs into the lungs 2 (two) times daily.   BYDUREON 2 MG Pen Generic drug:  Exenatide ER INJECT 2 MG INTO THE SKIN ONCE A WEEK   carvedilol 25 MG tablet Commonly known as:  COREG Take 25 mg by mouth 2 (two) times daily.   cloNIDine 0.2 MG tablet Commonly known as:  CATAPRES Take 0.2 mg by mouth 2 (two) times daily.   clopidogrel 75 MG tablet Commonly known as:  PLAVIX Take 1 tablet (75 mg total) by mouth daily.   finasteride 5 MG tablet Commonly known as:  PROSCAR Take 1 tablet (5 mg total) by mouth daily.   fluticasone 50 MCG/ACT nasal spray Commonly known as:  FLONASE  Place 2 sprays into both nostrils daily.   furosemide 40 MG tablet Commonly known as:  LASIX Take 40 mg by mouth 2 (two) times daily.   glipiZIDE 5 MG tablet Commonly known as:  GLUCOTROL TAKE 1 TABLET (5 MG TOTAL) BY MOUTH DAILY BEFORE BREAKFAST.   hydrALAZINE 25 MG tablet Commonly known as:  APRESOLINE Take 25 mg by mouth 2 (two) times daily.   insulin aspart 100 UNIT/ML FlexPen Commonly known as:  NOVOLOG FLEXPEN INJECT UP TO 12 UNITS UNITS SUBCUTANEOUS FOUR TIMES DAILY BEFORE MEALS   LANTUS SOLOSTAR 100 UNIT/ML Solostar Pen Generic drug:  Insulin Glargine TITRATE UP TO 80 UNITS, ONCE A DAY   levothyroxine 125 MCG tablet Commonly known as:  SYNTHROID, LEVOTHROID TAKE 1 TABLET (125 MCG TOTAL) BY  MOUTH DAILY.   losartan 100 MG tablet Commonly known as:  COZAAR Take 1 tablet (100 mg total) by mouth daily.   Lysine 500 MG Caps Take 1 capsule by mouth daily.   ONE TOUCH ULTRA TEST test strip Generic drug:  glucose blood USE ONE STRIP ONCE DAILY   oxyCODONE 5 MG immediate release tablet Commonly known as:  Oxy IR/ROXICODONE Take 1 tablet (5 mg total) by mouth daily as needed for severe pain.   pantoprazole 40 MG tablet Commonly known as:  PROTONIX Take 1 tablet (40 mg total) by mouth daily before breakfast.   STOOL SOFTENER PO Take 100 mg by mouth as needed.   tamsulosin 0.4 MG Caps capsule Commonly known as:  FLOMAX Take 1 capsule (0.4 mg total) by mouth daily.       Allergies: No Known Allergies  Family History: Family History  Problem Relation Age of Onset  . Emphysema Father   . Cancer - Lung Father   . Cancer - Ovarian Mother        27's  . Breast cancer Mother        late 29's    Social History:  reports that he quit smoking about 29 years ago. His smoking use included cigarettes. He has a 25.00 pack-year smoking history. His smokeless tobacco use includes chew. He reports that he does not drink alcohol or use drugs.  ROS: UROLOGY Frequent Urination?: Yes Hard to postpone urination?: Yes Burning/pain with urination?: No Get up at night to urinate?: Yes Leakage of urine?: No Urine stream starts and stops?: No Trouble starting stream?: No Do you have to strain to urinate?: No Blood in urine?: No Urinary tract infection?: No Sexually transmitted disease?: No Injury to kidneys or bladder?: No Painful intercourse?: No Weak stream?: No Erection problems?: No Penile pain?: No  Gastrointestinal Nausea?: No Vomiting?: No Indigestion/heartburn?: No Diarrhea?: No Constipation?: Yes  Constitutional Fever: No Night sweats?: No Weight loss?: No Fatigue?: No  Skin Skin rash/lesions?: No Itching?: No  Eyes Blurred vision?: No Double  vision?: No  Ears/Nose/Throat Sore throat?: No Sinus problems?: Yes  Hematologic/Lymphatic Swollen glands?: No Easy bruising?: Yes  Cardiovascular Leg swelling?: Yes Chest pain?: No  Respiratory Cough?: No Shortness of breath?: Yes  Endocrine Excessive thirst?: Yes  Musculoskeletal Back pain?: Yes Joint pain?: No  Neurological Headaches?: No Dizziness?: No  Psychologic Depression?: No Anxiety?: No  Physical Exam: BP (!) 171/92 (BP Location: Left Arm, Patient Position: Sitting, Cuff Size: Large)   Pulse 95   Ht 5\' 8"  (1.727 m)   Wt 279 lb 1.6 oz (126.6 kg)   SpO2 96%   BMI 42.44 kg/m   Constitutional:  Alert and oriented, No acute  distress. HEENT: [place] AT, moist mucus membranes.  Trachea midline, no masses. Cardiovascular: No clubbing, cyanosis, or edema. Respiratory: Normal respiratory effort, no increased work of breathing. GI: Abdomen is soft, nontender, nondistended, no abdominal masses GU: No CVA tenderness.  DRE: 30 g benign Skin: No rashes, bruises or suspicious lesions. Lymph: No cervical or inguinal adenopathy. Neurologic: Grossly intact, no focal deficits, moving all 4 extremities. Psychiatric: Normal mood and affect.  Laboratory Data: Lab Results  Component Value Date   WBC 6.6 11/09/2014   HGB 12.4 (L) 11/09/2014   HCT 38.9 11/09/2014   MCV 86 11/09/2014   PLT 241 11/09/2014    Lab Results  Component Value Date   CREATININE 1.53 (H) 12/21/2016    No results found for: PSA  Lab Results  Component Value Date   TESTOSTERONE 332 03/20/2017    Lab Results  Component Value Date   HGBA1C 6.4 08/20/2017    Urinalysis    Component Value Date/Time   COLORURINE Yellow 07/04/2013 1226   APPEARANCEUR Hazy 07/04/2013 1226   LABSPEC 1.020 07/04/2013 1226   PHURINE 5.0 07/04/2013 1226   GLUCOSEU 50 mg/dL 07/04/2013 1226   HGBUR Negative 07/04/2013 1226   BILIRUBINUR Negative 07/04/2013 1226   KETONESUR Negative 07/04/2013 1226    PROTEINUR >=500 07/04/2013 1226   NITRITE Negative 07/04/2013 1226   LEUKOCYTESUR Negative 07/04/2013 1226    Assessment & Plan:    1.  BPH -Continue Flomax 0.8 mg and finasteride -Follow-up annually  2.  Prostate cancer screening -We will check his PSA today (will need to account for finasteride)  Return in about 1 year (around 08/25/2018).  Nickie Retort, MD  [facility] [address], [place], [place] [address] [phone]

## 2017-08-25 LAB — PSA: Prostate Specific Ag, Serum: 1.4 ng/mL (ref 0.0–4.0)

## 2017-08-29 DIAGNOSIS — R0602 Shortness of breath: Secondary | ICD-10-CM | POA: Diagnosis not present

## 2017-08-30 ENCOUNTER — Telehealth: Payer: Self-pay

## 2017-08-30 DIAGNOSIS — I5022 Chronic systolic (congestive) heart failure: Secondary | ICD-10-CM | POA: Diagnosis not present

## 2017-08-30 NOTE — Telephone Encounter (Signed)
Pt informed

## 2017-08-30 NOTE — Telephone Encounter (Signed)
-----   Message from Nickie Retort, MD sent at 08/30/2017 12:20 PM EDT ----- Please let patient know PSA was normal. F/u in one year as scheduled. Thanks  ----- Message ----- From: Garnette Gunner, CMA Sent: 08/27/2017   9:35 AM To: Nickie Retort, MD    ----- Message ----- From: Interface, [facility] Lab Results In Sent: 08/25/2017   5:41 AM To: Rowe [facility]

## 2017-09-12 ENCOUNTER — Other Ambulatory Visit: Payer: Self-pay | Admitting: Family Medicine

## 2017-09-18 DIAGNOSIS — I10 Essential (primary) hypertension: Secondary | ICD-10-CM | POA: Diagnosis not present

## 2017-09-18 DIAGNOSIS — I5032 Chronic diastolic (congestive) heart failure: Secondary | ICD-10-CM | POA: Diagnosis not present

## 2017-09-18 DIAGNOSIS — I25110 Atherosclerotic heart disease of native coronary artery with unstable angina pectoris: Secondary | ICD-10-CM | POA: Diagnosis not present

## 2017-09-18 DIAGNOSIS — E782 Mixed hyperlipidemia: Secondary | ICD-10-CM | POA: Diagnosis not present

## 2017-09-25 DIAGNOSIS — D485 Neoplasm of uncertain behavior of skin: Secondary | ICD-10-CM | POA: Diagnosis not present

## 2017-09-25 DIAGNOSIS — Z08 Encounter for follow-up examination after completed treatment for malignant neoplasm: Secondary | ICD-10-CM | POA: Diagnosis not present

## 2017-09-25 DIAGNOSIS — Z85828 Personal history of other malignant neoplasm of skin: Secondary | ICD-10-CM | POA: Diagnosis not present

## 2017-09-25 DIAGNOSIS — C44319 Basal cell carcinoma of skin of other parts of face: Secondary | ICD-10-CM | POA: Diagnosis not present

## 2017-09-25 DIAGNOSIS — Z85820 Personal history of malignant melanoma of skin: Secondary | ICD-10-CM | POA: Diagnosis not present

## 2017-09-25 DIAGNOSIS — X32XXXA Exposure to sunlight, initial encounter: Secondary | ICD-10-CM | POA: Diagnosis not present

## 2017-09-25 DIAGNOSIS — L570 Actinic keratosis: Secondary | ICD-10-CM | POA: Diagnosis not present

## 2017-09-25 DIAGNOSIS — C44519 Basal cell carcinoma of skin of other part of trunk: Secondary | ICD-10-CM | POA: Diagnosis not present

## 2017-09-25 DIAGNOSIS — R202 Paresthesia of skin: Secondary | ICD-10-CM | POA: Diagnosis not present

## 2017-09-30 DIAGNOSIS — I5022 Chronic systolic (congestive) heart failure: Secondary | ICD-10-CM | POA: Diagnosis not present

## 2017-10-03 DIAGNOSIS — I129 Hypertensive chronic kidney disease with stage 1 through stage 4 chronic kidney disease, or unspecified chronic kidney disease: Secondary | ICD-10-CM | POA: Diagnosis not present

## 2017-10-03 DIAGNOSIS — N2581 Secondary hyperparathyroidism of renal origin: Secondary | ICD-10-CM | POA: Diagnosis not present

## 2017-10-03 DIAGNOSIS — R809 Proteinuria, unspecified: Secondary | ICD-10-CM | POA: Diagnosis not present

## 2017-10-03 DIAGNOSIS — E1122 Type 2 diabetes mellitus with diabetic chronic kidney disease: Secondary | ICD-10-CM | POA: Diagnosis not present

## 2017-10-03 DIAGNOSIS — N183 Chronic kidney disease, stage 3 (moderate): Secondary | ICD-10-CM | POA: Diagnosis not present

## 2017-10-11 ENCOUNTER — Encounter: Payer: Self-pay | Admitting: Internal Medicine

## 2017-10-11 ENCOUNTER — Ambulatory Visit: Payer: PPO | Admitting: Internal Medicine

## 2017-10-11 VITALS — BP 124/80 | HR 94 | Resp 16 | Ht 68.0 in | Wt 277.0 lb

## 2017-10-11 DIAGNOSIS — G4733 Obstructive sleep apnea (adult) (pediatric): Secondary | ICD-10-CM

## 2017-10-11 NOTE — Patient Instructions (Signed)
Increased EPAP to 12  Follow up in 3 months

## 2017-10-11 NOTE — Progress Notes (Signed)
Subjective:    Patient ID: Jacob Herrera, male    DOB: Nov 07, 1950, 67 y.o.   MRN: [id number]  Synopsis: Mr. Llamas first saw the [facility] pulmonary clinic in February 2015 for evaluation of shortness of breath. He had a past medical history significant for asthma. Pulmonary function testing was normal with the exception of restriction secondary to obesity. He was started on a combination long acting bronchodilator/inhaled corticosteroid inhaler and he noted significant benefit from this.   CC: OSA  HPI  H/o  acute CHF with CAD s/p cath is on IC/LABA,(symbicort)has chronic SOB/DOE  No signs of infection at this time  Patient is compliant with biPAP at this time, has been on oxygen since discharge Patient morbidly obese  Patient does NOT smoke at this time   10/2015  100% compliance Leak 117.4L/min AHI 12.9  06/2016 compliance report 100% compliance Leak 118 L/min AHI 9.1  11/18 100% compliance Leak 24 L/min AHI14  6.18.19 AHI 30 100% compliance No leak IPAP 25 EPAP 5  Overall, patient doing well from his biPAP therapy He feels well Has no signs of CHF exacerbation at this time No signs of infection  Review of Systems  Constitutional: Negative for chills, fatigue and fever.  HENT: Negative for postnasal drip.   Respiratory: Negative for chest tightness, shortness of breath and wheezing.   Cardiovascular: Positive for leg swelling. Negative for chest pain and palpitations.  Musculoskeletal: Positive for back pain and joint swelling.  All other systems reviewed and are negative.      Objective:   Physical Exam Vitals:   10/11/17 1122  BP: 124/80  Pulse: 94  Resp: 16  SpO2: 95%  Weight: 277 lb (125.6 kg)  Height: 5\' 8"  (1.727 m)  RA  Physical Examination:  GENERAL:NAD, no fevers, chills, no weakness no fatigue HEAD: Normocephalic, atraumatic.  EYES: Pupils equal, round, reactive to light. Extraocular muscles intact. No scleral icterus.   MOUTH: Moist mucosal membrane. Dentition intact. No abscess noted.  EAR, NOSE, THROAT: Clear without exudates. No external lesions.  NECK: Supple. No thyromegaly. No nodules. No JVD.  PULMONARY:CTA b/l CARDIOVASCULAR: S1 and S2. Regular rate and rhythm. No murmurs, rubs, or gallops. No edema. Pedal pulses 2+ bilaterally.  GASTROINTESTINAL: Soft, nontender, nondistended. No masses. Positive bowel sounds. No hepatosplenomegaly.  MUSCULOSKELETAL: No swelling, clubbing, or edema. Range of motion full in all extremities.  NEUROLOGIC: Cranial nerves II through XII are intact. No gross focal neurological deficits. Sensation intact. Reflexes intact.  SKIN: No ulceration, lesions, rashes, or cyanosis. Skin warm and dry. Turgor intact.  PSYCHIATRIC: Mood, affect within normal limits. The patient is awake, alert and oriented x 3. Insight, judgment intact.  ALL OTHER ROS ARE NEGATIVE       05/2013 Full FPT > ratio 82%, FEV1 1.95L (67% pred), no change with BD, TLC 3.91 L (64% pred), ERV 0.26L (18% pred), DLCO 15.6 (54% pred)     Assessment & Plan:  67 yo morbidly bese white male with  OSA with cardiomyopathy with s/p stent with Reactive airways disease and chronic kidney disease with chronic hypoxic resp failure Overall, patient with very poor prognosis. Patient with multiorgan dysfunction.    #1 chronic shortness of breath Related to underlying cardiomyopathy as well as his morbid obesity along with underlying sleep apnea and underlying reactive airways disease  #2 reactive airways disease likely from his morbid obesity Responds well to inhaler therapy No acute issues at this time and no indication for steroids at  this time  #3 cardiomyopathy Patient is to follow-up with Dr. Ubaldo Glassing as scheduled  #4 CKD Follow-up with the nephrology   #5 underlying sleep apnea Compliance report discussed with patient AHI is 30 Patient feels really well with his BiPAP therapy Will need to increased his EPAP  to 12 No longer has mask issues Continue therapy as prescribed   Patient satisfied with Plan of action and management. All questions answered Follow up in 3 months for compliance   Corrin Parker, M.D.  [facility]  Medical Director [place] Director [facility] Cardio-Pulmonary Department

## 2017-10-20 ENCOUNTER — Other Ambulatory Visit: Payer: Self-pay | Admitting: Family Medicine

## 2017-10-30 DIAGNOSIS — I5022 Chronic systolic (congestive) heart failure: Secondary | ICD-10-CM | POA: Diagnosis not present

## 2017-11-16 DIAGNOSIS — C44519 Basal cell carcinoma of skin of other part of trunk: Secondary | ICD-10-CM | POA: Diagnosis not present

## 2017-11-29 DIAGNOSIS — C44319 Basal cell carcinoma of skin of other parts of face: Secondary | ICD-10-CM | POA: Diagnosis not present

## 2017-11-30 DIAGNOSIS — I5022 Chronic systolic (congestive) heart failure: Secondary | ICD-10-CM | POA: Diagnosis not present

## 2017-12-01 ENCOUNTER — Other Ambulatory Visit: Payer: Self-pay | Admitting: Family Medicine

## 2017-12-01 DIAGNOSIS — E785 Hyperlipidemia, unspecified: Secondary | ICD-10-CM

## 2017-12-10 DIAGNOSIS — C44519 Basal cell carcinoma of skin of other part of trunk: Secondary | ICD-10-CM | POA: Diagnosis not present

## 2017-12-10 DIAGNOSIS — D225 Melanocytic nevi of trunk: Secondary | ICD-10-CM | POA: Diagnosis not present

## 2017-12-19 ENCOUNTER — Ambulatory Visit: Payer: PPO | Admitting: Family Medicine

## 2017-12-19 ENCOUNTER — Encounter: Payer: Self-pay | Admitting: Family Medicine

## 2017-12-19 VITALS — BP 175/92 | HR 72 | Temp 98.0°F | Resp 18 | Ht 68.0 in | Wt 273.0 lb

## 2017-12-19 DIAGNOSIS — I5032 Chronic diastolic (congestive) heart failure: Secondary | ICD-10-CM

## 2017-12-19 DIAGNOSIS — I10 Essential (primary) hypertension: Secondary | ICD-10-CM | POA: Diagnosis not present

## 2017-12-19 DIAGNOSIS — N183 Chronic kidney disease, stage 3 unspecified: Secondary | ICD-10-CM

## 2017-12-19 DIAGNOSIS — E1121 Type 2 diabetes mellitus with diabetic nephropathy: Secondary | ICD-10-CM | POA: Diagnosis not present

## 2017-12-19 DIAGNOSIS — I25110 Atherosclerotic heart disease of native coronary artery with unstable angina pectoris: Secondary | ICD-10-CM

## 2017-12-19 DIAGNOSIS — R350 Frequency of micturition: Secondary | ICD-10-CM | POA: Diagnosis not present

## 2017-12-19 LAB — POCT GLYCOSYLATED HEMOGLOBIN (HGB A1C)
Est. average glucose Bld gHb Est-mCnc: 137
Hemoglobin A1C: 6.4 % — AB (ref 4.0–5.6)

## 2017-12-19 MED ORDER — DOXAZOSIN MESYLATE 4 MG PO TABS: ORAL_TABLET | ORAL | 1 refills | Status: DC

## 2017-12-19 MED ORDER — INSULIN ASPART 100 UNIT/ML FLEXPEN: PEN_INJECTOR | SUBCUTANEOUS | 12 refills | Status: DC

## 2017-12-19 NOTE — Progress Notes (Signed)
Patient: Jacob Herrera Male    DOB: 1950/10/16   67 y.o.   MRN: [id number] Visit Date: 12/19/2017  Today's Provider: Lelon Huh, MD   No chief complaint on file.  Subjective:    HPI   Diabetes Mellitus Type II, Follow-up:   Lab Results  Component Value Date   HGBA1C 6.4 08/20/2017   HGBA1C 6.3 (H) 03/20/2017   HGBA1C 6.4 12/12/2016   Last seen for diabetes 4 months ago.  Management since then includes; no changes. He reports good compliance with treatment. He is not having side effects.   Episodes of hypoglycemia? yes - usually late in the morning a few hours after eating breakfast.    Current Insulin Regimen: 50 units lantus daily and 20 units novolog before breakfast.    ---------------------------------------------------------------   Hypertension, follow-up:  BP Readings from Last 3 Encounters:  10/11/17 124/80  08/24/17 (!) 171/92  08/20/17 (!) 152/84    He was last seen for hypertension 4 months ago.  BP at that visit was 152/84. Since then he has had a lot of trouble with increase BP when seen by his nephrologist and cardiologist. Has been started on clonidine which has been titrated up to 0.3mg  twice a day but BP remains elevated. States he feel very fatigued from all of his medications.   Wt Readings from Last 3 Encounters:  10/11/17 277 lb (125.6 kg)  08/24/17 279 lb 1.6 oz (126.6 kg)  08/20/17 279 lb (126.6 kg)    ---------------------------------------------------------------    No Known Allergies   Current Outpatient Medications:  .  albuterol (PROVENTIL) (2.5 MG/3ML) 0.083% nebulizer solution, Take 3 mLs (2.5 mg total) by nebulization every 4 (four) hours. And PRN, Disp: 75 mL, Rfl: 12 .  amLODipine (NORVASC) 10 MG tablet, Take 1 tablet (10 mg total) by mouth daily., Disp: 30 tablet, Rfl: 1 .  aspirin 81 MG tablet, Take 81 mg by mouth daily., Disp: , Rfl:  .  atorvastatin (LIPITOR) 80 MG tablet, TAKE 1 TABLET BY MOUTH AT  BEDTIME, Disp: 90 tablet, Rfl: 4 .  BD PEN NEEDLE NANO U/F 32G X 4 MM MISC, USE DAILY AS DIRECTED, Disp: 100 each, Rfl: 3 .  budesonide-formoterol (SYMBICORT) 80-4.5 MCG/ACT inhaler, Inhale 2 puffs into the lungs 2 (two) times daily., Disp: 3 Inhaler, Rfl: 2 .  BYDUREON 2 MG PEN, INJECT 2 MG INTO THE SKIN ONCE A WEEK, Disp: 4 each, Rfl: 11 .  carvedilol (COREG) 25 MG tablet, Take 25 mg by mouth 2 (two) times daily., Disp: , Rfl:  .  cloNIDine (CATAPRES) 0.3 MG tablet, Take 0.3 mg by mouth 2 (two) times daily., Disp: , Rfl: 11 .  clopidogrel (PLAVIX) 75 MG tablet, Take 1 tablet (75 mg total) by mouth daily., Disp: 30 tablet, Rfl: 11 .  Docusate Calcium (STOOL SOFTENER PO), Take 100 mg by mouth as needed. , Disp: , Rfl:  .  finasteride (PROSCAR) 5 MG tablet, Take 1 tablet (5 mg total) by mouth daily., Disp: 30 tablet, Rfl: 11 .  fluticasone (FLONASE) 50 MCG/ACT nasal spray, Place 2 sprays into both nostrils daily., Disp: 16 g, Rfl: 2 .  furosemide (LASIX) 40 MG tablet, Take 40 mg by mouth 2 (two) times daily. , Disp: , Rfl:  .  glipiZIDE (GLUCOTROL) 5 MG tablet, TAKE 1 TABLET (5 MG TOTAL) BY MOUTH DAILY BEFORE BREAKFAST., Disp: 90 tablet, Rfl: 3 .  hydrALAZINE (APRESOLINE) 25 MG tablet, Take 25 mg by mouth  2 (two) times daily., Disp: , Rfl:  .  LANTUS SOLOSTAR 100 UNIT/ML Solostar Pen, TITRATE UP TO 80 UNITS, ONCE A DAY (Patient taking differently: TITRATE UP TO 50-55 UNITS, ONCE A DAY), Disp: 20 pen, Rfl: 3 .  levothyroxine (SYNTHROID, LEVOTHROID) 125 MCG tablet, TAKE 1 TABLET (125 MCG TOTAL) BY MOUTH DAILY., Disp: 30 tablet, Rfl: 11 .  losartan (COZAAR) 100 MG tablet, Take 1 tablet (100 mg total) by mouth daily., Disp: 30 tablet, Rfl: 1 .  Lysine 500 MG CAPS, Take 1 capsule by mouth daily., Disp: , Rfl:  .  Naproxen Sodium (ALEVE PO), Take by mouth., Disp: , Rfl:  .  NOVOLOG FLEXPEN 100 UNIT/ML FlexPen, INJECT UP TO 12 UNITS UNITS SUBCUTANEOUS FOUR TIMES DAILY BEFORE MEALS, Disp: 15 mL, Rfl: 12 .   ONE TOUCH ULTRA TEST test strip, USE ONE STRIP ONCE DAILY, Disp: 100 each, Rfl: 4 .  oxyCODONE (OXY IR/ROXICODONE) 5 MG immediate release tablet, Take 1 tablet (5 mg total) by mouth daily as needed for severe pain., Disp: 30 tablet, Rfl: 0 .  pantoprazole (PROTONIX) 40 MG tablet, Take 1 tablet (40 mg total) by mouth daily before breakfast., Disp: 30 tablet, Rfl: 3 .  tamsulosin (FLOMAX) 0.4 MG CAPS capsule twice a day, Take by mouth., Disp: , Rfl:   Review of Systems  Constitutional: Negative for appetite change, chills and fever.  Respiratory: Negative for chest tightness, shortness of breath and wheezing.   Cardiovascular: Negative for chest pain and palpitations.  Gastrointestinal: Negative for abdominal pain, nausea and vomiting.    Social History   Tobacco Use  . Smoking status: Former Smoker    Packs/day: 1.00    Years: 25.00    Pack years: 25.00    Types: Cigarettes    Last attempt to quit: 05/29/1988    Years since quitting: 29.5  . Smokeless tobacco: Current User    Types: Chew  . Tobacco comment: chews 1 bag of loose leaf chew/week  Substance Use Topics  . Alcohol use: No    Alcohol/week: 0.0 standard drinks   Objective:   BP (!) 175/92 (BP Location: Right Arm, Cuff Size: Large)   Pulse 72   Temp 98 F (36.7 C) (Oral)   Resp 18   Ht 5\' 8"  (1.727 m)   Wt 273 lb (123.8 kg)   SpO2 97%   BMI 41.51 kg/m     Physical Exam  General Appearance:    Alert, cooperative, no distress, obese  Eyes:    PERRL, conjunctiva/corneas clear, EOM's intact       Lungs:     Clear to auscultation bilaterally, respirations unlabored  Heart:    Regular rate and rhythm  Neurologic:   Awake, alert, oriented x 3. No apparent focal neurological           defect.        A1c=6.4%    Assessment & Plan:     1. Diabetes mellitus with nephropathy ([place]) Well controlled, but seems to be having some hypoglycemia in late mornings. Will change novolog from breakfast to lunchtime and reduce to  16 units. Continue all other medications.  - POCT glycosylated hemoglobin (Hb A1C)  2. Chronic diastolic heart failure (HCC) Stable, continue regular follow up with Dr. Ubaldo Glassing.   3. Atherosclerosis of native coronary artery of native heart with unstable angina pectoris (HCC) Asymptomatic. Compliant with medication.  Continue aggressive risk factor modification. .   4. Chronic kidney disease (CKD), stage III (moderate) (HCC)  Continue regular follow up Dr. Juleen China.   5. Morbid obesity ([place]) Diet and exercise.   6. Frequency of micturition Has been taking Flomax BID. Will change to doxazosin in hopes of better blood pressure control.   7. Essential hypertension Change tamsulosin to doxazosin. Start with 1/2 of 4mg  tab every morning for 6 days, then increase to 4mg . Counseled on potential for orthostatic symptoms and to call if he has any. Otherwise follow up 3 months.        Lelon Huh, MD  [facility]

## 2017-12-19 NOTE — Patient Instructions (Signed)
   Stop taking Flomax when you start doxazosin

## 2017-12-20 DIAGNOSIS — I25110 Atherosclerotic heart disease of native coronary artery with unstable angina pectoris: Secondary | ICD-10-CM | POA: Diagnosis not present

## 2017-12-20 DIAGNOSIS — E782 Mixed hyperlipidemia: Secondary | ICD-10-CM | POA: Diagnosis not present

## 2017-12-20 DIAGNOSIS — I10 Essential (primary) hypertension: Secondary | ICD-10-CM | POA: Diagnosis not present

## 2017-12-20 DIAGNOSIS — I5032 Chronic diastolic (congestive) heart failure: Secondary | ICD-10-CM | POA: Diagnosis not present

## 2017-12-31 DIAGNOSIS — I5022 Chronic systolic (congestive) heart failure: Secondary | ICD-10-CM | POA: Diagnosis not present

## 2018-01-17 ENCOUNTER — Other Ambulatory Visit: Payer: Self-pay | Admitting: Family Medicine

## 2018-01-17 DIAGNOSIS — N041 Nephrotic syndrome with focal and segmental glomerular lesions: Secondary | ICD-10-CM | POA: Diagnosis not present

## 2018-01-17 DIAGNOSIS — E1122 Type 2 diabetes mellitus with diabetic chronic kidney disease: Secondary | ICD-10-CM | POA: Diagnosis not present

## 2018-01-17 DIAGNOSIS — I10 Essential (primary) hypertension: Secondary | ICD-10-CM | POA: Diagnosis not present

## 2018-01-17 DIAGNOSIS — N183 Chronic kidney disease, stage 3 (moderate): Secondary | ICD-10-CM | POA: Diagnosis not present

## 2018-01-21 DIAGNOSIS — N041 Nephrotic syndrome with focal and segmental glomerular lesions: Secondary | ICD-10-CM | POA: Diagnosis not present

## 2018-01-21 DIAGNOSIS — N2581 Secondary hyperparathyroidism of renal origin: Secondary | ICD-10-CM | POA: Diagnosis not present

## 2018-01-21 DIAGNOSIS — N183 Chronic kidney disease, stage 3 (moderate): Secondary | ICD-10-CM | POA: Diagnosis not present

## 2018-01-21 DIAGNOSIS — E1122 Type 2 diabetes mellitus with diabetic chronic kidney disease: Secondary | ICD-10-CM | POA: Diagnosis not present

## 2018-01-21 DIAGNOSIS — I10 Essential (primary) hypertension: Secondary | ICD-10-CM | POA: Diagnosis not present

## 2018-01-21 DIAGNOSIS — R809 Proteinuria, unspecified: Secondary | ICD-10-CM | POA: Diagnosis not present

## 2018-01-29 ENCOUNTER — Encounter: Payer: Self-pay | Admitting: Internal Medicine

## 2018-01-29 ENCOUNTER — Ambulatory Visit: Payer: PPO | Admitting: Internal Medicine

## 2018-01-29 VITALS — BP 150/90 | HR 72 | Ht 68.0 in | Wt 272.6 lb

## 2018-01-29 DIAGNOSIS — I10 Essential (primary) hypertension: Secondary | ICD-10-CM | POA: Diagnosis not present

## 2018-01-29 DIAGNOSIS — G4733 Obstructive sleep apnea (adult) (pediatric): Secondary | ICD-10-CM | POA: Diagnosis not present

## 2018-01-29 DIAGNOSIS — I429 Cardiomyopathy, unspecified: Secondary | ICD-10-CM

## 2018-01-29 NOTE — Progress Notes (Signed)
Subjective:    Patient ID: Erica Osuna, male    DOB: 07-08-1950, 67 y.o.   MRN: [id number]  Synopsis: Mr. Waymire first saw the [facility] ([place] pulmonary clinic in February 2015 for evaluation of shortness of breath. He had a past medical history significant for asthma. Pulmonary function testing was normal with the exception of restriction secondary to obesity. He was started on a combination long acting bronchodilator/inhaled corticosteroid inhaler and he noted significant benefit from this.   CC: OSA  HPI  Patient has history of CHF and sleep apnea Morbidly obese with chronic shortness of breath and dyspnea on exertion Patient does NOT smoke at this time  Compliance reports as listed below 10/2015  100% compliance Leak 117.4L/min AHI 12.9  06/2016 compliance report 100% compliance Leak 118 L/min AHI 9.1  11/18 100% compliance Leak 24 L/min AHI14  6.18.19 AHI 30 100% compliance No leak IPAP 25 EPAP 5  10/19 AHI 10 100% compliance BiPAP IPAP 25 EPAP 12  Overall patient is compliant with the BiPAP machine Settings reviewed Compliance report as follows   No signs of infection at this time No signs of acute heart failure at this time  Review of Systems  Constitutional: Negative for chills, fatigue and fever.  HENT: Negative for postnasal drip.   Respiratory: Negative for chest tightness, shortness of breath and wheezing.   Cardiovascular: Positive for leg swelling. Negative for chest pain and palpitations.  Musculoskeletal: Positive for back pain and joint swelling.  All other systems reviewed and are negative.      Objective:   Physical Exam Vitals:   01/29/18 1401  Weight: 272 lb 9.6 oz (123.7 kg)  Height: 5\' 8"  (1.727 m)  RA  Physical Examination:   GENERAL:NAD, no fevers, chills, no weakness no fatigue HEAD: Normocephalic, atraumatic.  EYES: Pupils equal, round, reactive to light. Extraocular muscles intact. No scleral icterus.  MOUTH:  Moist mucosal membrane. Dentition intact. No abscess noted.  EAR, NOSE, THROAT: Clear without exudates. No external lesions.  NECK: Supple. No thyromegaly. No nodules. No JVD.  PULMONARY: CTA B/L no wheezing, rhonchi, crackles CARDIOVASCULAR: S1 and S2. Regular rate and rhythm. No murmurs, rubs, or gallops. No edema. Pedal pulses 2+ bilaterally.  GASTROINTESTINAL: Soft, nontender, nondistended. No masses. Positive bowel sounds. No hepatosplenomegaly.  MUSCULOSKELETAL: +edema. Range of motion full in all extremities.  NEUROLOGIC: Cranial nerves II through XII are intact. No gross focal neurological deficits. Sensation intact. Reflexes intact.  SKIN: No ulceration, lesions, rashes, or cyanosis. Skin warm and dry. Turgor intact.  PSYCHIATRIC: Mood, affect within normal limits. The patient is awake, alert and oriented x 3. Insight, judgment intact.  ALL OTHER ROS ARE NEGATIVE    05/2013 Full FPT > ratio 82%, FEV1 1.95L (67% pred), no change with BD, TLC 3.91 L (64% pred), ERV 0.26L (18% pred), DLCO 15.6 (54% pred)     Assessment & Plan:   67 year old morbidly obese white male with severe sleep apnea with history of cardiomyopathy status post stent with underlying reactive airways disease with chronic kidney disease with chronic hypoxic respiratory failure  Overall patient has a very poor prognosis due to his morbid obesity and comorbid conditions patient with underlying multiorgan dysfunction  #1 chronic shortness of breath-multifactorial related to his morbid obesity underlying sleep apnea and underlying reactive airways disease  #2 reactive airways disease likely from his morbid obesity Responds well to inhaler therapy No acute issues at this time and no indication for steroids at this time  #  3  Cardiomyopathy with hypertension   Patient is to follow-up with Dr. Ubaldo Glassing with cardiology and Dr. Juleen China with nephrology   #4 CKD Follow-up with the nephrology   #5  Underlying sleep apnea  severe  Compliance report discussed with patient AHI is 10 down from 30 from previous visit Patient is doing well with his BiPAP therapy at increased pressure settings  Patient does not have any mask issues continue therapy as prescribed Patient wears full facemask  #6 Obesity -recommend significant weight loss -recommend changing diet  #7 Deconditioned state -Recommend increased daily activity and exercise   Patient satisfied with Plan of action and management. All questions answered Follow up in 6 months for compliance   Corrin Parker, M.D.  [facility]  Medical Director [place] Director [facility] Cardio-Pulmonary Department

## 2018-01-29 NOTE — Patient Instructions (Signed)
Continue BiPAP as prescribed  Follow up Cardiology and Nephrology Lasix as tolerated  Continue BP meds as prescribed

## 2018-01-30 DIAGNOSIS — I5022 Chronic systolic (congestive) heart failure: Secondary | ICD-10-CM | POA: Diagnosis not present

## 2018-01-31 ENCOUNTER — Other Ambulatory Visit: Payer: Self-pay | Admitting: Family Medicine

## 2018-01-31 DIAGNOSIS — E1121 Type 2 diabetes mellitus with diabetic nephropathy: Secondary | ICD-10-CM

## 2018-02-11 ENCOUNTER — Telehealth: Payer: Self-pay | Admitting: Urology

## 2018-02-11 DIAGNOSIS — N401 Enlarged prostate with lower urinary tract symptoms: Secondary | ICD-10-CM

## 2018-02-11 MED ORDER — FINASTERIDE 5 MG PO TABS: 5.0000 mg | ORAL_TABLET | Freq: Every day | ORAL | 11 refills | Status: DC

## 2018-02-11 NOTE — Telephone Encounter (Signed)
Pt saw [facility] in 08/2017 and is scheduled to see [facility] 08/2018.  He is out of Finasteride and needs enough to last to his appt.  He uses [facility] [address].  Please call pt [phone]

## 2018-02-11 NOTE — Telephone Encounter (Signed)
RX sent to pharmacy  

## 2018-02-12 ENCOUNTER — Telehealth: Payer: Self-pay | Admitting: Urology

## 2018-02-12 NOTE — Telephone Encounter (Signed)
Patient has been notified ° °Jacob Herrera °

## 2018-03-02 DIAGNOSIS — I5022 Chronic systolic (congestive) heart failure: Secondary | ICD-10-CM | POA: Diagnosis not present

## 2018-03-15 DIAGNOSIS — E119 Type 2 diabetes mellitus without complications: Secondary | ICD-10-CM | POA: Diagnosis not present

## 2018-03-15 DIAGNOSIS — H35373 Puckering of macula, bilateral: Secondary | ICD-10-CM | POA: Diagnosis not present

## 2018-03-15 LAB — HM DIABETES EYE EXAM

## 2018-03-19 ENCOUNTER — Encounter: Payer: Self-pay | Admitting: *Deleted

## 2018-03-26 ENCOUNTER — Ambulatory Visit (INDEPENDENT_AMBULATORY_CARE_PROVIDER_SITE_OTHER): Payer: PPO | Admitting: Family Medicine

## 2018-03-26 ENCOUNTER — Encounter: Payer: Self-pay | Admitting: Family Medicine

## 2018-03-26 VITALS — BP 176/84 | HR 60 | Temp 97.9°F | Resp 20 | Wt 277.0 lb

## 2018-03-26 DIAGNOSIS — E1121 Type 2 diabetes mellitus with diabetic nephropathy: Secondary | ICD-10-CM | POA: Diagnosis not present

## 2018-03-26 DIAGNOSIS — I159 Secondary hypertension, unspecified: Secondary | ICD-10-CM

## 2018-03-26 DIAGNOSIS — N051 Unspecified nephritic syndrome with focal and segmental glomerular lesions: Secondary | ICD-10-CM | POA: Insufficient documentation

## 2018-03-26 LAB — POCT GLYCOSYLATED HEMOGLOBIN (HGB A1C): Hemoglobin A1C: 6 % — AB (ref 4.0–5.6)

## 2018-03-26 MED ORDER — SEMAGLUTIDE (1 MG/DOSE) 2 MG/1.5ML ~~LOC~~ SOPN: 1.0000 mg | PEN_INJECTOR | SUBCUTANEOUS | 11 refills | Status: DC

## 2018-03-26 NOTE — Progress Notes (Signed)
Patient: Jacob Herrera Male    DOB: Apr 26, 1950   67 y.o.   MRN: [id number] Visit Date: 03/26/2018  Today's Provider: Lelon Huh, MD   No chief complaint on file.  Subjective:    HPI    Diabetes Mellitus Type II, Follow-up:   Lab Results  Component Value Date   HGBA1C 6.4 (A) 12/19/2017   HGBA1C 6.4 08/20/2017   HGBA1C 6.3 (H) 03/20/2017   Last seen for diabetes 4 months ago.  Management since then includes Pt advised to take his Novolog to lunch time and reduce it to 16 units.  Pt states he has reduce the insulin ever further to 14 units, and is still taking it after breakfast.  He reports excellent compliance with treatment. He is not having side effects.  Current symptoms include paresthesia of the feet, polydipsia and polyuria and have been stable. Home blood sugar records: fasting range: 116-160  Episodes of hypoglycemia? yes - On occasion but pt reports it is less frequent since reducing his insulin to 14 units after breakfast.    Most Recent Eye Exam: UTD Weight trend: stable Current diet: in general, a "healthy" diet   Current exercise: Some, pt has trouble exercising secondary to back pain.  ------------------------------------------------------------------------   Hypertension, follow-up:  BP Readings from Last 3 Encounters:  03/26/18 (!) 176/84  01/29/18 (!) 150/90  12/19/17 (!) 175/92    He was last seen for hypertension 4 months ago.  Management since that visit includes Changing from Tamsulosin to Doxazosin. Dr. Juleen China added Isosorbide 60mg , and Dr. Ubaldo Glassing increased hydralazine 25mg  Three times a day.  He reports excellent compliance with treatment. He is not having side effects.  He is exercising some. He is not adherent to low salt diet.   Outside blood pressures are Pt is not checking his blood pressure at home. He is experiencing none.  Patient denies chest pain, exertional chest pressure/discomfort, fatigue, lower extremity edema  and palpitations.   Cardiovascular risk factors include advanced age (older than 105 for men, 76 for women), diabetes mellitus, dyslipidemia, hypertension, male gender, obesity (BMI >= 30 kg/m2) and sedentary lifestyle.  Use of agents associated with hypertension: none.   ------------------------------------------------------------------------    Lipid/Cholesterol, Follow-up:   Last seen for this 12 months ago.  Management since that visit includes None.  Last Lipid Panel:    Component Value Date/Time   CHOL 152 03/20/2017 1503   CHOL 183 11/23/2016 1053   TRIG 260 (H) 03/20/2017 1503   HDL 35 (L) 03/20/2017 1503   HDL 34 (L) 11/23/2016 1053   CHOLHDL 4.3 03/20/2017 1503   LDLCALC 83 03/20/2017 1503    He reports excellent compliance with treatment. He is not having side effects.    Wt Readings from Last 5 Encounters:  03/26/18 277 lb (125.6 kg)  01/29/18 272 lb 9.6 oz (123.7 kg)  12/19/17 273 lb (123.8 kg)  10/11/17 277 lb (125.6 kg)  08/24/17 279 lb 1.6 oz (126.6 kg)    ------------------------------------------------------------------------      No Known Allergies   Current Outpatient Medications:  .  albuterol (PROVENTIL) (2.5 MG/3ML) 0.083% nebulizer solution, Take 3 mLs (2.5 mg total) by nebulization every 4 (four) hours. And PRN, Disp: 75 mL, Rfl: 12 .  amLODipine (NORVASC) 10 MG tablet, Take 1 tablet (10 mg total) by mouth daily., Disp: 30 tablet, Rfl: 1 .  aspirin 81 MG tablet, Take 81 mg by mouth daily., Disp: , Rfl:  .  atorvastatin (LIPITOR) 80 MG tablet, TAKE 1 TABLET BY MOUTH AT BEDTIME, Disp: 90 tablet, Rfl: 4 .  BD PEN NEEDLE NANO U/F 32G X 4 MM MISC, USE DAILY AS DIRECTED, Disp: 100 each, Rfl: 3 .  budesonide-formoterol (SYMBICORT) 80-4.5 MCG/ACT inhaler, Inhale 2 puffs into the lungs 2 (two) times daily., Disp: 3 Inhaler, Rfl: 2 .  BYDUREON 2 MG PEN, INJECT 2 MG INTO THE SKIN ONCE A WEEK, Disp: 4 each, Rfl: 11 .  carvedilol (COREG) 25 MG tablet,  Take 25 mg by mouth 2 (two) times daily., Disp: , Rfl:  .  cloNIDine (CATAPRES) 0.3 MG tablet, Take 0.3 mg by mouth 2 (two) times daily., Disp: , Rfl: 11 .  doxazosin (CARDURA) 4 MG tablet, Take 1/2 tablet daily for six days, then increase to 1 tablet daily. Discontinue Flomax, Disp: 90 tablet, Rfl: 1 .  finasteride (PROSCAR) 5 MG tablet, Take 1 tablet (5 mg total) by mouth daily., Disp: 30 tablet, Rfl: 11 .  fluticasone (FLONASE) 50 MCG/ACT nasal spray, Place 2 sprays into both nostrils daily., Disp: 16 g, Rfl: 2 .  furosemide (LASIX) 40 MG tablet, Take 40 mg by mouth 2 (two) times daily. , Disp: , Rfl:  .  glipiZIDE (GLUCOTROL) 5 MG tablet, TAKE 1 TABLET (5 MG TOTAL) BY MOUTH DAILY BEFORE BREAKFAST., Disp: 90 tablet, Rfl: 3 .  hydrALAZINE (APRESOLINE) 25 MG tablet, Take 25 mg by mouth 3 (three) times daily. , Disp: , Rfl:  .  insulin aspart (NOVOLOG FLEXPEN) 100 UNIT/ML FlexPen, Take 16 units before lunch., Disp: 15 mL, Rfl: 12 .  isosorbide mononitrate (IMDUR) 60 MG 24 hr tablet, Take 60 mg by mouth daily., Disp: , Rfl:  .  LANTUS SOLOSTAR 100 UNIT/ML Solostar Pen, TITRATE UP TO 80 UNITS, ONCE A DAY, Disp: 20 pen, Rfl: 3 .  levothyroxine (SYNTHROID, LEVOTHROID) 125 MCG tablet, TAKE 1 TABLET (125 MCG TOTAL) BY MOUTH DAILY., Disp: 90 tablet, Rfl: 4 .  losartan (COZAAR) 100 MG tablet, Take 1 tablet (100 mg total) by mouth daily., Disp: 30 tablet, Rfl: 1 .  Lysine 500 MG CAPS, Take 1 capsule by mouth daily., Disp: , Rfl:  .  Naproxen Sodium (ALEVE PO), Take by mouth., Disp: , Rfl:  .  ONE TOUCH ULTRA TEST test strip, USE ONE STRIP ONCE DAILY, Disp: 100 each, Rfl: 4 .  oxyCODONE (OXY IR/ROXICODONE) 5 MG immediate release tablet, Take 1 tablet (5 mg total) by mouth daily as needed for severe pain., Disp: 30 tablet, Rfl: 0 .  pantoprazole (PROTONIX) 40 MG tablet, Take 1 tablet (40 mg total) by mouth daily before breakfast., Disp: 30 tablet, Rfl: 3 .  clopidogrel (PLAVIX) 75 MG tablet, Take 1 tablet (75  mg total) by mouth daily., Disp: 30 tablet, Rfl: 11 .  Docusate Calcium (STOOL SOFTENER PO), Take 100 mg by mouth as needed. , Disp: , Rfl:   Review of Systems  Constitutional: Negative.   Respiratory: Positive for shortness of breath. Negative for apnea, cough, choking (Chronic issue, pt states this is stable.), chest tightness, wheezing and stridor.   Cardiovascular: Negative.   Gastrointestinal: Positive for constipation. Negative for abdominal distention, abdominal pain, anal bleeding, blood in stool, diarrhea, nausea, rectal pain and vomiting.  Musculoskeletal: Positive for back pain and gait problem. Negative for arthralgias, joint swelling, myalgias, neck pain and neck stiffness.  Neurological: Positive for light-headedness and numbness. Negative for dizziness and headaches.    Social History   Tobacco Use  . Smoking  status: Former Smoker    Packs/day: 1.00    Years: 25.00    Pack years: 25.00    Types: Cigarettes    Last attempt to quit: 05/29/1988    Years since quitting: 29.8  . Smokeless tobacco: Current User    Types: Chew  . Tobacco comment: chews 1 bag of loose leaf chew/week  Substance Use Topics  . Alcohol use: No    Alcohol/week: 0.0 standard drinks   Objective:   BP (!) 176/84 (BP Location: Right Arm, Patient Position: Sitting, Cuff Size: Large)   Pulse 60   Temp 97.9 F (36.6 C) (Oral)   Resp 20   Wt 277 lb (125.6 kg)   BMI 42.12 kg/m  Vitals:   03/26/18 1620  BP: (!) 176/84  Pulse: 60  Resp: 20  Temp: 97.9 F (36.6 C)  TempSrc: Oral  Weight: 277 lb (125.6 kg)     Physical Exam  General Appearance:    Alert, cooperative, no distress, obese  Eyes:    PERRL, conjunctiva/corneas clear, EOM's intact       Lungs:     Clear to auscultation bilaterally, respirations unlabored  Heart:    Regular rate and rhythm  Neurologic:   Awake, alert, oriented x 3. No apparent focal neurological           defect.          Assessment & Plan:     1. Diabetes  mellitus with nephropathy ([place]) Much better on Bydureon, but is too expensive. Ozpempic appears to be better covered on formulary. Advised to change to ozempic one week after finishing Bydureon if able to fill prescription. Can stop glipizide.  - POCT glycosylated hemoglobin (Hb A1C) - Semaglutide, 1 MG/DOSE, (OZEMPIC, 1 MG/DOSE,) 2 MG/1.5ML SOPN; Inject 1 mg into the skin once a week.  Dispense: 1 pen; Refill: 11  2. 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 Check urine for - Metanephrines, Urine, 24 hour - Cortisol, urine, free Consider increasing doxazocin        Lelon Huh, MD  [facility]

## 2018-03-26 NOTE — Patient Instructions (Signed)
   S\tart Ozempic 7 days after you take the last dose of Bydureon

## 2018-03-28 DIAGNOSIS — I159 Secondary hypertension, unspecified: Secondary | ICD-10-CM | POA: Diagnosis not present

## 2018-04-03 LAB — METANEPHRINES, URINE, 24 HOUR
Metaneph Total, Ur: 32 ug/L
Metanephrines, 24H Ur: 96 ug/24 hr (ref 45–290)
Normetanephrine, 24H Ur: 231 ug/24 hr (ref 82–500)
Normetanephrine, Ur: 77 ug/L

## 2018-04-03 LAB — CORTISOL, URINE, FREE
Cortisol (Ur), Free: 21 ug/24 hr (ref 5–64)
Cortisol,F,ug/L,U: 7 ug/L

## 2018-04-04 ENCOUNTER — Other Ambulatory Visit: Payer: Self-pay

## 2018-04-04 DIAGNOSIS — I10 Essential (primary) hypertension: Secondary | ICD-10-CM | POA: Diagnosis not present

## 2018-04-04 DIAGNOSIS — E1122 Type 2 diabetes mellitus with diabetic chronic kidney disease: Secondary | ICD-10-CM | POA: Diagnosis not present

## 2018-04-04 DIAGNOSIS — N183 Chronic kidney disease, stage 3 (moderate): Secondary | ICD-10-CM | POA: Diagnosis not present

## 2018-04-04 DIAGNOSIS — N041 Nephrotic syndrome with focal and segmental glomerular lesions: Secondary | ICD-10-CM | POA: Diagnosis not present

## 2018-04-04 MED ORDER — DOXAZOSIN MESYLATE 8 MG PO TABS: ORAL_TABLET | ORAL | 1 refills | Status: DC

## 2018-04-08 DIAGNOSIS — N2581 Secondary hyperparathyroidism of renal origin: Secondary | ICD-10-CM | POA: Diagnosis not present

## 2018-04-08 DIAGNOSIS — E1122 Type 2 diabetes mellitus with diabetic chronic kidney disease: Secondary | ICD-10-CM | POA: Diagnosis not present

## 2018-04-08 DIAGNOSIS — Z6841 Body Mass Index (BMI) 40.0 and over, adult: Secondary | ICD-10-CM | POA: Diagnosis not present

## 2018-04-08 DIAGNOSIS — I129 Hypertensive chronic kidney disease with stage 1 through stage 4 chronic kidney disease, or unspecified chronic kidney disease: Secondary | ICD-10-CM | POA: Diagnosis not present

## 2018-04-08 DIAGNOSIS — G5601 Carpal tunnel syndrome, right upper limb: Secondary | ICD-10-CM | POA: Diagnosis not present

## 2018-04-08 DIAGNOSIS — N183 Chronic kidney disease, stage 3 (moderate): Secondary | ICD-10-CM | POA: Diagnosis not present

## 2018-04-08 DIAGNOSIS — R809 Proteinuria, unspecified: Secondary | ICD-10-CM | POA: Diagnosis not present

## 2018-04-12 DIAGNOSIS — C44519 Basal cell carcinoma of skin of other part of trunk: Secondary | ICD-10-CM | POA: Diagnosis not present

## 2018-04-12 DIAGNOSIS — Z85828 Personal history of other malignant neoplasm of skin: Secondary | ICD-10-CM | POA: Diagnosis not present

## 2018-04-12 DIAGNOSIS — L570 Actinic keratosis: Secondary | ICD-10-CM | POA: Diagnosis not present

## 2018-04-12 DIAGNOSIS — Z08 Encounter for follow-up examination after completed treatment for malignant neoplasm: Secondary | ICD-10-CM | POA: Diagnosis not present

## 2018-04-12 DIAGNOSIS — D485 Neoplasm of uncertain behavior of skin: Secondary | ICD-10-CM | POA: Diagnosis not present

## 2018-04-12 DIAGNOSIS — D225 Melanocytic nevi of trunk: Secondary | ICD-10-CM | POA: Diagnosis not present

## 2018-04-12 DIAGNOSIS — D2261 Melanocytic nevi of right upper limb, including shoulder: Secondary | ICD-10-CM | POA: Diagnosis not present

## 2018-04-12 DIAGNOSIS — Z85820 Personal history of malignant melanoma of skin: Secondary | ICD-10-CM | POA: Diagnosis not present

## 2018-04-12 DIAGNOSIS — D2262 Melanocytic nevi of left upper limb, including shoulder: Secondary | ICD-10-CM | POA: Diagnosis not present

## 2018-04-12 DIAGNOSIS — X32XXXA Exposure to sunlight, initial encounter: Secondary | ICD-10-CM | POA: Diagnosis not present

## 2018-04-12 DIAGNOSIS — C44619 Basal cell carcinoma of skin of left upper limb, including shoulder: Secondary | ICD-10-CM | POA: Diagnosis not present

## 2018-04-27 ENCOUNTER — Other Ambulatory Visit: Payer: Self-pay | Admitting: Family Medicine

## 2018-04-30 ENCOUNTER — Encounter: Payer: Self-pay | Admitting: Family Medicine

## 2018-04-30 ENCOUNTER — Ambulatory Visit (INDEPENDENT_AMBULATORY_CARE_PROVIDER_SITE_OTHER): Payer: PPO | Admitting: Family Medicine

## 2018-04-30 VITALS — BP 180/92 | HR 68 | Temp 97.7°F | Resp 16 | Wt 276.0 lb

## 2018-04-30 DIAGNOSIS — I10 Essential (primary) hypertension: Secondary | ICD-10-CM | POA: Diagnosis not present

## 2018-04-30 MED ORDER — HYDRALAZINE HCL 25 MG PO TABS: 50.0000 mg | ORAL_TABLET | Freq: Three times a day (TID) | ORAL | 0 refills | Status: DC

## 2018-04-30 NOTE — Progress Notes (Signed)
Patient: Jacob Herrera Male    DOB: July 21, 1950   68 y.o.   MRN: [id number] Visit Date: 04/30/2018  Today's Provider: Lelon Huh, MD   Chief Complaint  Patient presents with  . Hypertension    Four week follow up   Subjective:     HPI    Hypertension, follow-up:  BP Readings from Last 3 Encounters:  04/30/18 (!) 193/80  03/26/18 (!) 176/84  01/29/18 (!) 150/90    He was last seen for hypertension 4 weeks ago.  BP at that visit was 176/84. Management since that visit includes Increased Doxazosin to 78m a day. Also had urine metanephrines and cortisol checked which were normal.  He reports excellent compliance with treatment. He is not having side effects.  He is not exercising secondary to back pain. He is adherent to low salt diet.   Outside blood pressures are not being checked..[name] KitchenHe is experiencing fatigue.  Patient denies chest pain, irregular heart beat, lower extremity edema and palpitations.   Cardiovascular risk factors include advanced age (older than 526for men, 621for women), diabetes mellitus, dyslipidemia, hypertension, male gender, obesity (BMI >= 30 kg/m2) and sedentary lifestyle.  Use of agents associated with hypertension: none.     Weight trend: stable Wt Readings from Last 3 Encounters:  04/30/18 276 lb (125.2 kg)  03/26/18 277 lb (125.6 kg)  01/29/18 272 lb 9.6 oz (123.7 kg)    Current diet: in general, a "healthy" diet    ------------------------------------------------------------------------  Was started on Ozempic due to formulary not covering Bydureon last month. Is doing well with medications and reports stable blood sugars.   No Known Allergies   Current Outpatient Medications:  .  albuterol (PROVENTIL) (2.5 MG/3ML) 0.083% nebulizer solution, Take 3 mLs (2.5 mg total) by nebulization every 4 (four) hours. And PRN, Disp: 75 mL, Rfl: 12 .  amLODipine (NORVASC) 10 MG tablet, Take 1 tablet (10 mg total) by mouth daily., Disp:  30 tablet, Rfl: 1 .  aspirin 81 MG tablet, Take 81 mg by mouth daily., Disp: , Rfl:  .  atorvastatin (LIPITOR) 80 MG tablet, TAKE 1 TABLET BY MOUTH AT BEDTIME, Disp: 90 tablet, Rfl: 4 .  budesonide-formoterol (SYMBICORT) 80-4.5 MCG/ACT inhaler, Inhale 2 puffs into the lungs 2 (two) times daily., Disp: 3 Inhaler, Rfl: 2 .  carvedilol (COREG) 25 MG tablet, Take 25 mg by mouth 2 (two) times daily., Disp: , Rfl:  .  cloNIDine (CATAPRES) 0.3 MG tablet, Take 0.3 mg by mouth 2 (two) times daily., Disp: , Rfl: 11 .  clopidogrel (PLAVIX) 75 MG tablet, Take 1 tablet (75 mg total) by mouth daily., Disp: 30 tablet, Rfl: 11 .  Docusate Calcium (STOOL SOFTENER PO), Take 100 mg by mouth as needed. , Disp: , Rfl:  .  doxazosin (CARDURA) 8 MG tablet, TAKE 1 TABLET DAILY. DISCONTINUE FLOMAX, Disp: 30 tablet, Rfl: 5 .  finasteride (PROSCAR) 5 MG tablet, Take 1 tablet (5 mg total) by mouth daily., Disp: 30 tablet, Rfl: 11 .  fluticasone (FLONASE) 50 MCG/ACT nasal spray, Place 2 sprays into both nostrils daily., Disp: 16 g, Rfl: 2 .  furosemide (LASIX) 40 MG tablet, Take 40 mg by mouth 2 (two) times daily. , Disp: , Rfl:  .  glipiZIDE (GLUCOTROL) 5 MG tablet, TAKE 1 TABLET (5 MG TOTAL) BY MOUTH DAILY BEFORE BREAKFAST., Disp: 90 tablet, Rfl: 3 .  hydrALAZINE (APRESOLINE) 25 MG tablet, Take 25 mg by mouth 3 (three)  times daily. , Disp: , Rfl:  .  insulin aspart (NOVOLOG FLEXPEN) 100 UNIT/ML FlexPen, Take 16 units before lunch., Disp: 15 mL, Rfl: 12 .  isosorbide mononitrate (IMDUR) 60 MG 24 hr tablet, Take 60 mg by mouth daily., Disp: , Rfl:  .  LANTUS SOLOSTAR 100 UNIT/ML Solostar Pen, TITRATE UP TO 80 UNITS, ONCE A DAY, Disp: 20 pen, Rfl: 3 .  levothyroxine (SYNTHROID, LEVOTHROID) 125 MCG tablet, TAKE 1 TABLET (125 MCG TOTAL) BY MOUTH DAILY., Disp: 90 tablet, Rfl: 4 .  losartan (COZAAR) 100 MG tablet, Take 1 tablet (100 mg total) by mouth daily., Disp: 30 tablet, Rfl: 1 .  Lysine 500 MG CAPS, Take 1 capsule by mouth  daily., Disp: , Rfl:  .  Naproxen Sodium (ALEVE PO), Take by mouth., Disp: , Rfl:  .  oxyCODONE (OXY IR/ROXICODONE) 5 MG immediate release tablet, Take 1 tablet (5 mg total) by mouth daily as needed for severe pain., Disp: 30 tablet, Rfl: 0 .  pantoprazole (PROTONIX) 40 MG tablet, Take 1 tablet (40 mg total) by mouth daily before breakfast., Disp: 30 tablet, Rfl: 3 .  Semaglutide, 1 MG/DOSE, (OZEMPIC, 1 MG/DOSE,) 2 MG/1.5ML SOPN, Inject 1 mg into the skin once a week., Disp: 1 pen, Rfl: 11 .  BD PEN NEEDLE NANO U/F 32G X 4 MM MISC, USE DAILY AS DIRECTED, Disp: 100 each, Rfl: 3 .  ONE TOUCH ULTRA TEST test strip, USE ONE STRIP ONCE DAILY, Disp: 100 each, Rfl: 4  Review of Systems  Constitutional: Positive for fatigue. Negative for activity change, appetite change, chills, diaphoresis, fever and unexpected weight change.  Respiratory: Positive for apnea. Negative for cough, choking, chest tightness, wheezing and stridor.   Cardiovascular: Negative.   Gastrointestinal: Positive for abdominal pain (Lower abdominal pain.), constipation and diarrhea. Negative for abdominal distention, anal bleeding, blood in stool, nausea, rectal pain and vomiting.  Musculoskeletal: Positive for back pain. Negative for arthralgias, gait problem, joint swelling, myalgias and neck stiffness.  Neurological: Negative for dizziness and light-headedness.    Social History   Tobacco Use  . Smoking status: Former Smoker    Packs/day: 1.00    Years: 25.00    Pack years: 25.00    Types: Cigarettes    Last attempt to quit: 05/29/1988    Years since quitting: 29.9  . Smokeless tobacco: Current User    Types: Chew  . Tobacco comment: chews 1 bag of loose leaf chew/week  Substance Use Topics  . Alcohol use: No    Alcohol/week: 0.0 standard drinks      Objective:   BP (!) 193/80 (BP Location: Right Arm, Patient Position: Sitting, Cuff Size: Large)   Pulse 68   Temp 97.7 F (36.5 C) (Oral)   Resp 16   Wt 276 lb  (125.2 kg)   BMI 41.97 kg/m  Vitals:   04/30/18 1603  BP: (!) 193/80  Pulse: 68  Resp: 16  Temp: 97.7 F (36.5 C)  TempSrc: Oral  Weight: 276 lb (125.2 kg)     Physical Exam  General Appearance:    Alert, cooperative, no distress, obese  Eyes:    PERRL, conjunctiva/corneas clear, EOM's intact       Lungs:     Clear to auscultation bilaterally, respirations unlabored  Heart:    Regular rate and rhythm  Neurologic:   Awake, alert, oriented x 3. No apparent focal neurological           defect.  Assessment & Plan    1. Resistant hypertension Uncontrolled. Will double  hydralazine to 2 x 69m tablets three times daily. Continue all other medications.   He had been on hctz years ago which was effective, but discontinued due to CKD. Review of notes from nephrology show eGFR has been consistently in the mid 439s May benefit from adding low dose hctz, although i'm already concerned about polypharmacy.   Will have him return in 2 weeks for BP check.         DLelon Huh MD  [facility]

## 2018-04-30 NOTE — Patient Instructions (Signed)
.   Please bring all of your medications to every appointment so we can make sure that our medication list is the same as yours.   

## 2018-05-01 DIAGNOSIS — C44619 Basal cell carcinoma of skin of left upper limb, including shoulder: Secondary | ICD-10-CM | POA: Diagnosis not present

## 2018-05-01 DIAGNOSIS — C44519 Basal cell carcinoma of skin of other part of trunk: Secondary | ICD-10-CM | POA: Diagnosis not present

## 2018-05-21 ENCOUNTER — Ambulatory Visit (INDEPENDENT_AMBULATORY_CARE_PROVIDER_SITE_OTHER): Payer: PPO | Admitting: Family Medicine

## 2018-05-21 ENCOUNTER — Encounter: Payer: Self-pay | Admitting: Family Medicine

## 2018-05-21 VITALS — BP 180/94 | HR 66 | Temp 97.8°F | Resp 18 | Wt 275.0 lb

## 2018-05-21 DIAGNOSIS — I10 Essential (primary) hypertension: Secondary | ICD-10-CM

## 2018-05-21 MED ORDER — CHLORTHALIDONE 25 MG PO TABS: 25.0000 mg | ORAL_TABLET | Freq: Every day | ORAL | 1 refills | Status: DC

## 2018-05-21 NOTE — Patient Instructions (Signed)
.   Please review the attached list of medications and notify my office if there are any errors.   . Please bring all of your medications to every appointment so we can make sure that our medication list is the same as yours.   

## 2018-05-21 NOTE — Progress Notes (Signed)
Patient: Jacob Herrera Male    DOB: 01/20/1951   68 y.o.   MRN: [id number] Visit Date: 05/21/2018  Today's Provider: Lelon Huh, MD   Chief Complaint  Patient presents with  . Hypertension   Subjective:     HPI  Hypertension, follow-up:  BP Readings from Last 3 Encounters:  05/21/18 (!) 180/94  04/30/18 (!) 180/92  03/26/18 (!) 176/84    He was last seen for hypertension 3 weeks ago.  BP at that visit was 180/92. Management since that visit includes doubling the dose of Hydralazine to 2 x 25mg  tablets three times daily. Patient was advised to continue all other medications He reports good compliance with treatment. He is not having side effects.  He is not exercising.   Outside blood pressures are not checked at home. He is experiencing none.  Patient denies chest pain, chest pressure/discomfort, claudication, dyspnea, exertional chest pressure/discomfort, fatigue, irregular heart beat, lower extremity edema, near-syncope, orthopnea, palpitations, paroxysmal nocturnal dyspnea, syncope and tachypnea.   Cardiovascular risk factors include advanced age (older than 57 for men, 28 for women), hypertension and male gender.  Use of agents associated with hypertension: NSAIDS.     Weight trend: stable Wt Readings from Last 3 Encounters:  05/21/18 275 lb (124.7 kg)  04/30/18 276 lb (125.2 kg)  03/26/18 277 lb (125.6 kg)     ------------------------------------------------------------------------  Allergies  Allergen Reactions  . Spironolactone     gynecomastia     Current Outpatient Medications:  .  albuterol (PROVENTIL) (2.5 MG/3ML) 0.083% nebulizer solution, Take 3 mLs (2.5 mg total) by nebulization every 4 (four) hours. And PRN, Disp: 75 mL, Rfl: 12 .  amLODipine (NORVASC) 10 MG tablet, Take 1 tablet (10 mg total) by mouth daily., Disp: 30 tablet, Rfl: 1 .  aspirin 81 MG tablet, Take 81 mg by mouth daily., Disp: , Rfl:  .  atorvastatin (LIPITOR) 80 MG  tablet, TAKE 1 TABLET BY MOUTH AT BEDTIME, Disp: 90 tablet, Rfl: 4 .  BD PEN NEEDLE NANO U/F 32G X 4 MM MISC, USE DAILY AS DIRECTED, Disp: 100 each, Rfl: 3 .  budesonide-formoterol (SYMBICORT) 80-4.5 MCG/ACT inhaler, Inhale 2 puffs into the lungs 2 (two) times daily., Disp: 3 Inhaler, Rfl: 2 .  carvedilol (COREG) 25 MG tablet, Take 25 mg by mouth 2 (two) times daily., Disp: , Rfl:  .  cloNIDine (CATAPRES) 0.3 MG tablet, Take 0.3 mg by mouth 2 (two) times daily., Disp: , Rfl: 11 .  clopidogrel (PLAVIX) 75 MG tablet, Take 1 tablet (75 mg total) by mouth daily., Disp: 30 tablet, Rfl: 11 .  Docusate Calcium (STOOL SOFTENER PO), Take 100 mg by mouth as needed. , Disp: , Rfl:  .  doxazosin (CARDURA) 8 MG tablet, TAKE 1 TABLET DAILY. DISCONTINUE FLOMAX, Disp: 30 tablet, Rfl: 5 .  finasteride (PROSCAR) 5 MG tablet, Take 1 tablet (5 mg total) by mouth daily., Disp: 30 tablet, Rfl: 11 .  fluticasone (FLONASE) 50 MCG/ACT nasal spray, Place 2 sprays into both nostrils daily., Disp: 16 g, Rfl: 2 .  furosemide (LASIX) 40 MG tablet, Take 40 mg by mouth 2 (two) times daily. , Disp: , Rfl:  .  glipiZIDE (GLUCOTROL) 5 MG tablet, TAKE 1 TABLET (5 MG TOTAL) BY MOUTH DAILY BEFORE BREAKFAST., Disp: 90 tablet, Rfl: 3 .  hydrALAZINE (APRESOLINE) 25 MG tablet, Take 2 tablets (50 mg total) by mouth 3 (three) times daily., Disp: 3 tablet, Rfl: 0 .  insulin  aspart (NOVOLOG FLEXPEN) 100 UNIT/ML FlexPen, Take 16 units before lunch., Disp: 15 mL, Rfl: 12 .  isosorbide mononitrate (IMDUR) 60 MG 24 hr tablet, Take 60 mg by mouth daily., Disp: , Rfl:  .  LANTUS SOLOSTAR 100 UNIT/ML Solostar Pen, TITRATE UP TO 80 UNITS, ONCE A DAY, Disp: 20 pen, Rfl: 3 .  levothyroxine (SYNTHROID, LEVOTHROID) 125 MCG tablet, TAKE 1 TABLET (125 MCG TOTAL) BY MOUTH DAILY., Disp: 90 tablet, Rfl: 4 .  losartan (COZAAR) 100 MG tablet, Take 1 tablet (100 mg total) by mouth daily., Disp: 30 tablet, Rfl: 1 .  Lysine 500 MG CAPS, Take 1 capsule by mouth  daily., Disp: , Rfl:  .  Naproxen Sodium (ALEVE PO), Take by mouth., Disp: , Rfl:  .  ONE TOUCH ULTRA TEST test strip, USE ONE STRIP ONCE DAILY, Disp: 100 each, Rfl: 4 .  oxyCODONE (OXY IR/ROXICODONE) 5 MG immediate release tablet, Take 1 tablet (5 mg total) by mouth daily as needed for severe pain., Disp: 30 tablet, Rfl: 0 .  pantoprazole (PROTONIX) 40 MG tablet, Take 1 tablet (40 mg total) by mouth daily before breakfast., Disp: 30 tablet, Rfl: 3 .  Semaglutide, 1 MG/DOSE, (OZEMPIC, 1 MG/DOSE,) 2 MG/1.5ML SOPN, Inject 1 mg into the skin once a week., Disp: 1 pen, Rfl: 11  Review of Systems  Constitutional: Negative for appetite change, chills and fever.  Respiratory: Negative for chest tightness, shortness of breath and wheezing.   Cardiovascular: Negative for chest pain and palpitations.  Gastrointestinal: Negative for abdominal pain, nausea and vomiting.    Social History   Tobacco Use  . Smoking status: Former Smoker    Packs/day: 1.00    Years: 25.00    Pack years: 25.00    Types: Cigarettes    Last attempt to quit: 05/29/1988    Years since quitting: 29.9  . Smokeless tobacco: Current User    Types: Chew  . Tobacco comment: chews 1 bag of loose leaf chew/week  Substance Use Topics  . Alcohol use: No    Alcohol/week: 0.0 standard drinks      Objective:   BP (!) 180/94 (BP Location: Left Arm, Patient Position: Sitting, Cuff Size: Large)   Pulse 66   Temp 97.8 F (36.6 C) (Oral)   Resp 18   Wt 275 lb (124.7 kg)   SpO2 98% Comment: room air  BMI 41.81 kg/m  Vitals:   05/21/18 1642  BP: (!) 180/94  Pulse: 66  Resp: 18  Temp: 97.8 F (36.6 C)  TempSrc: Oral  SpO2: 98%  Weight: 275 lb (124.7 kg)     Physical Exam  General appearance: alert, well developed, well nourished, cooperative and in no distress, obese Head: Normocephalic, without obvious abnormality, atraumatic Respiratory: Respirations even and unlabored, normal respiratory rate Extremities: No  gross deformities Skin: Skin color, texture, turgor normal. No rashes seen  Psych: Appropriate mood and affect. Neurologic: Mental status: Alert, oriented to person, place, and time, thought content appropriate.     Assessment & Plan    1. Resistant hypertension No improvement after doubling dose of hydrazine. He has had significant response to hctz and spironolactone in the past, but off of spironolactone due to gynecomastia and taken off hctz many years ago due to CKD which has been stable. Will add- chlorthalidone (HYGROTON) 25 MG tablet; Take 1 tablet (25 mg total) by mouth daily.  Dispense: 30 tablet; Refill: 1  If effective will wean off of some of his other antihypertensives. He  does have appt on 05-27-2018 with Dr. Juleen China.      Lelon Huh, MD  [facility]

## 2018-05-26 ENCOUNTER — Other Ambulatory Visit: Payer: Self-pay | Admitting: Family Medicine

## 2018-05-27 ENCOUNTER — Other Ambulatory Visit: Payer: Self-pay | Admitting: Family Medicine

## 2018-05-27 DIAGNOSIS — N183 Chronic kidney disease, stage 3 (moderate): Secondary | ICD-10-CM | POA: Diagnosis not present

## 2018-05-27 DIAGNOSIS — R809 Proteinuria, unspecified: Secondary | ICD-10-CM | POA: Diagnosis not present

## 2018-05-27 DIAGNOSIS — I129 Hypertensive chronic kidney disease with stage 1 through stage 4 chronic kidney disease, or unspecified chronic kidney disease: Secondary | ICD-10-CM | POA: Diagnosis not present

## 2018-05-27 DIAGNOSIS — E1122 Type 2 diabetes mellitus with diabetic chronic kidney disease: Secondary | ICD-10-CM | POA: Diagnosis not present

## 2018-05-27 DIAGNOSIS — N2581 Secondary hyperparathyroidism of renal origin: Secondary | ICD-10-CM | POA: Diagnosis not present

## 2018-05-27 DIAGNOSIS — I10 Essential (primary) hypertension: Secondary | ICD-10-CM

## 2018-05-27 MED ORDER — HYDRALAZINE HCL 50 MG PO TABS: 50.0000 mg | ORAL_TABLET | Freq: Three times a day (TID) | ORAL | 1 refills | Status: DC

## 2018-05-27 NOTE — Telephone Encounter (Signed)
Pt advised.   Thanks,   -Laura  

## 2018-05-27 NOTE — Telephone Encounter (Signed)
Yes, he is supposed to continue hydralazine for now. Have sent new prescription for 50mg  tablets so he only needs to take one tablet three times a day.

## 2018-05-27 NOTE — Telephone Encounter (Signed)
Pt needing a call back on hydrALAZINE (APRESOLINE) 25 MG tablet.  He is needing to know if he needs to continue to take this Rx at 50 mg 3 times a day. He is now out of medication and wants to be sure if he needs to continue.  Please advise.  Thanks, [employer]

## 2018-05-27 NOTE — Telephone Encounter (Signed)
Please advise   Thanks,    -Thuan Tippett  

## 2018-06-04 DIAGNOSIS — E1122 Type 2 diabetes mellitus with diabetic chronic kidney disease: Secondary | ICD-10-CM | POA: Diagnosis not present

## 2018-06-04 DIAGNOSIS — N041 Nephrotic syndrome with focal and segmental glomerular lesions: Secondary | ICD-10-CM | POA: Diagnosis not present

## 2018-06-04 DIAGNOSIS — N183 Chronic kidney disease, stage 3 (moderate): Secondary | ICD-10-CM | POA: Diagnosis not present

## 2018-06-04 DIAGNOSIS — I10 Essential (primary) hypertension: Secondary | ICD-10-CM | POA: Diagnosis not present

## 2018-06-12 ENCOUNTER — Other Ambulatory Visit: Payer: Self-pay | Admitting: Family Medicine

## 2018-06-12 DIAGNOSIS — I10 Essential (primary) hypertension: Secondary | ICD-10-CM

## 2018-06-16 ENCOUNTER — Other Ambulatory Visit: Payer: Self-pay | Admitting: Family Medicine

## 2018-06-19 DIAGNOSIS — I10 Essential (primary) hypertension: Secondary | ICD-10-CM | POA: Diagnosis not present

## 2018-06-19 DIAGNOSIS — I25110 Atherosclerotic heart disease of native coronary artery with unstable angina pectoris: Secondary | ICD-10-CM | POA: Diagnosis not present

## 2018-06-19 DIAGNOSIS — I5032 Chronic diastolic (congestive) heart failure: Secondary | ICD-10-CM | POA: Diagnosis not present

## 2018-06-26 ENCOUNTER — Ambulatory Visit: Payer: PPO | Admitting: Family Medicine

## 2018-07-02 ENCOUNTER — Encounter: Payer: Self-pay | Admitting: Family Medicine

## 2018-07-02 ENCOUNTER — Ambulatory Visit (INDEPENDENT_AMBULATORY_CARE_PROVIDER_SITE_OTHER): Payer: PPO | Admitting: Family Medicine

## 2018-07-02 VITALS — BP 160/76 | HR 74 | Temp 97.8°F | Resp 18 | Wt 273.0 lb

## 2018-07-02 DIAGNOSIS — I10 Essential (primary) hypertension: Secondary | ICD-10-CM | POA: Diagnosis not present

## 2018-07-02 DIAGNOSIS — E1121 Type 2 diabetes mellitus with diabetic nephropathy: Secondary | ICD-10-CM | POA: Diagnosis not present

## 2018-07-02 DIAGNOSIS — I25110 Atherosclerotic heart disease of native coronary artery with unstable angina pectoris: Secondary | ICD-10-CM

## 2018-07-02 LAB — POCT GLYCOSYLATED HEMOGLOBIN (HGB A1C)
Est. average glucose Bld gHb Est-mCnc: 148
Hemoglobin A1C: 6.8 % — AB (ref 4.0–5.6)

## 2018-07-02 MED ORDER — CLONIDINE HCL 0.2 MG PO TABS: 0.2000 mg | ORAL_TABLET | Freq: Two times a day (BID) | ORAL | 3 refills | Status: DC

## 2018-07-02 MED ORDER — CARVEDILOL 25 MG PO TABS: 25.0000 mg | ORAL_TABLET | Freq: Two times a day (BID) | ORAL | 2 refills | Status: DC

## 2018-07-02 NOTE — Patient Instructions (Signed)
.   Please review the attached list of medications and notify my office if there are any errors.   . Please bring all of your medications to every appointment so we can make sure that our medication list is the same as yours.   

## 2018-07-02 NOTE — Progress Notes (Signed)
Patient: Jacob Herrera Male    DOB: 1950/08/12   68 y.o.   MRN: [id number] Visit Date: 07/02/2018  Today's Provider: Lelon Huh, MD   Chief Complaint  Patient presents with  . Diabetes  . Hypertension   Subjective:     HPI  Diabetes Mellitus Type II, Follow-up:   Lab Results  Component Value Date   HGBA1C 6.0 (A) 03/26/2018   HGBA1C 6.4 (A) 12/19/2017   HGBA1C 6.4 08/20/2017    Last seen for diabetes 3 months ago.  Management since then includes changing from Bydureon to [place] due to cost. Patient was also advised to stop Glipizide.  He reports good compliance with treatment. He is not having side effects.  Current symptoms include none and have been stable. Home blood sugar records: fasting range: over 200  Episodes of hypoglycemia? no   Current Insulin Regimen: Lantus 40-55 units and Novolog 16 units  Most Recent Eye Exam: 03/15/2018 Weight trend: stable Prior visit with dietician: No Current exercise: none Current diet habits: in general, an "unhealthy" diet   Pertinent Labs:    Component Value Date/Time   CHOL 152 03/20/2017 1503   CHOL 183 11/23/2016 1053   TRIG 260 (H) 03/20/2017 1503   HDL 35 (L) 03/20/2017 1503   HDL 34 (L) 11/23/2016 1053   LDLCALC 83 03/20/2017 1503   CREATININE 1.53 (H) 12/21/2016 1527   CREATININE 1.36 (H) 07/08/2013 0555    Wt Readings from Last 5 Encounters:  07/02/18 273 lb (123.8 kg)  05/21/18 275 lb (124.7 kg)  04/30/18 276 lb (125.2 kg)  03/26/18 277 lb (125.6 kg)  01/29/18 272 lb 9.6 oz (123.7 kg)     ------------------------------------------------------------------------  Hypertension, follow-up:  BP Readings from Last 3 Encounters:  07/02/18 (!) 160/76  05/21/18 (!) 180/94  04/30/18 (!) 180/92    He was last seen for hypertension 3 months ago.  BP at that visit was 184/94. Management since that visit includes adding Chlorthalidone 25mg  daily. He reports good compliance with treatment.  Patient ran out of Carvedilol about 3 weeks ago. Has been trying to get refilled by Dr. Juleen China but there has been some problem with the pharmacy. BP was 148/76 at visit with Dr. Juleen China on 2-30-2020 before running out of carvedilol.  He is not having side effects.  He is not exercising. He is not adherent to low salt diet.   Outside blood pressures are checked occasionally. He is experiencing none.  Patient denies chest pain, chest pressure/discomfort, claudication, exertional chest pressure/discomfort, fatigue, irregular heart beat, lower extremity edema, near-syncope, orthopnea, palpitations, paroxysmal nocturnal dyspnea, syncope and tachypnea.   Cardiovascular risk factors include advanced age (older than 109 for men, 81 for women), diabetes mellitus, hypertension and male gender.  Use of agents associated with hypertension: NSAIDS.     Weight trend: stable Wt Readings from Last 3 Encounters:  07/02/18 273 lb (123.8 kg)  05/21/18 275 lb (124.7 kg)  04/30/18 276 lb (125.2 kg)    Current diet: in general, an "unhealthy" diet  ------------------------------------------------------------------------  Allergies  Allergen Reactions  . Spironolactone     gynecomastia     Current Outpatient Medications:  .  albuterol (PROVENTIL) (2.5 MG/3ML) 0.083% nebulizer solution, Take 3 mLs (2.5 mg total) by nebulization every 4 (four) hours. And PRN, Disp: 75 mL, Rfl: 12 .  amLODipine (NORVASC) 10 MG tablet, Take 1 tablet (10 mg total) by mouth daily., Disp: 30 tablet, Rfl: 1 .  aspirin  81 MG tablet, Take 81 mg by mouth daily., Disp: , Rfl:  .  atorvastatin (LIPITOR) 80 MG tablet, TAKE 1 TABLET BY MOUTH AT BEDTIME, Disp: 90 tablet, Rfl: 4 .  BD PEN NEEDLE NANO U/F 32G X 4 MM MISC, USE DAILY AS DIRECTED, Disp: 100 each, Rfl: 3 .  budesonide-formoterol (SYMBICORT) 80-4.5 MCG/ACT inhaler, Inhale 2 puffs into the lungs 2 (two) times daily., Disp: 3 Inhaler, Rfl: 2 .  chlorthalidone (HYGROTON) 25 MG  tablet, TAKE 1 TABLET BY MOUTH EVERY DAY, Disp: 30 tablet, Rfl: 3 .  cloNIDine (CATAPRES) 0.3 MG tablet, Take 0.3 mg by mouth 2 (two) times daily., Disp: , Rfl: 11 .  clopidogrel (PLAVIX) 75 MG tablet, Take 1 tablet (75 mg total) by mouth daily., Disp: 30 tablet, Rfl: 11 .  Docusate Calcium (STOOL SOFTENER PO), Take 100 mg by mouth as needed. , Disp: , Rfl:  .  doxazosin (CARDURA) 8 MG tablet, TAKE 1 TABLET DAILY. DISCONTINUE FLOMAX, Disp: 30 tablet, Rfl: 5 .  finasteride (PROSCAR) 5 MG tablet, Take 1 tablet (5 mg total) by mouth daily., Disp: 30 tablet, Rfl: 11 .  fluticasone (FLONASE) 50 MCG/ACT nasal spray, Place 2 sprays into both nostrils daily., Disp: 16 g, Rfl: 2 .  furosemide (LASIX) 40 MG tablet, Take 40 mg by mouth 2 (two) times daily. , Disp: , Rfl:  .  hydrALAZINE (APRESOLINE) 50 MG tablet, Take 1 tablet (50 mg total) by mouth 3 (three) times daily., Disp: 270 tablet, Rfl: 1 .  insulin aspart (NOVOLOG FLEXPEN) 100 UNIT/ML FlexPen, Take 16 units before lunch., Disp: 15 mL, Rfl: 12 .  isosorbide mononitrate (IMDUR) 60 MG 24 hr tablet, Take 60 mg by mouth daily., Disp: , Rfl:  .  LANTUS SOLOSTAR 100 UNIT/ML Solostar Pen, TITRATE UP TO 80 UNITS, ONCE A DAY, Disp: 20 pen, Rfl: 3 .  levothyroxine (SYNTHROID, LEVOTHROID) 125 MCG tablet, TAKE 1 TABLET (125 MCG TOTAL) BY MOUTH DAILY., Disp: 90 tablet, Rfl: 4 .  losartan (COZAAR) 100 MG tablet, Take 1 tablet (100 mg total) by mouth daily., Disp: 30 tablet, Rfl: 1 .  Lysine 500 MG CAPS, Take 1 capsule by mouth daily., Disp: , Rfl:  .  Naproxen Sodium (ALEVE PO), Take by mouth., Disp: , Rfl:  .  ONE TOUCH ULTRA TEST test strip, USE ONE STRIP ONCE DAILY, Disp: 100 each, Rfl: 4 .  oxyCODONE (OXY IR/ROXICODONE) 5 MG immediate release tablet, Take 1 tablet (5 mg total) by mouth daily as needed for severe pain., Disp: 30 tablet, Rfl: 0 .  pantoprazole (PROTONIX) 40 MG tablet, Take 1 tablet (40 mg total) by mouth daily before breakfast., Disp: 30 tablet,  Rfl: 3 .  Semaglutide, 1 MG/DOSE, (OZEMPIC, 1 MG/DOSE,) 2 MG/1.5ML SOPN, Inject 1 mg into the skin once a week., Disp: 1 pen, Rfl: 11 .  carvedilol (COREG) 25 MG tablet, Take 25 mg by mouth 2 (two) times daily., Disp: , Rfl:  .  glipiZIDE (GLUCOTROL) 5 MG tablet, TAKE 1 TABLET (5 MG TOTAL) BY MOUTH DAILY BEFORE BREAKFAST. (Patient not taking: Reported on 07/02/2018), Disp: 90 tablet, Rfl: 3  Review of Systems  Constitutional: Negative for appetite change, chills and fever.  Respiratory: Positive for shortness of breath. Negative for chest tightness and wheezing.   Cardiovascular: Negative for chest pain and palpitations.  Gastrointestinal: Negative for abdominal pain, nausea and vomiting.  Musculoskeletal: Positive for back pain.    Social History   Tobacco Use  . Smoking status: Former  Smoker    Packs/day: 1.00    Years: 25.00    Pack years: 25.00    Types: Cigarettes    Last attempt to quit: 05/29/1988    Years since quitting: 30.1  . Smokeless tobacco: Current User    Types: Chew  . Tobacco comment: chews 1 bag of loose leaf chew/week  Substance Use Topics  . Alcohol use: No    Alcohol/week: 0.0 standard drinks      Objective:   BP (!) 160/76 (BP Location: Right Arm, Cuff Size: Large)   Pulse 74   Temp 97.8 F (36.6 C) (Oral)   Resp 18   Wt 273 lb (123.8 kg)   SpO2 95% Comment: room air  BMI 41.51 kg/m  Vitals:   07/02/18 1605 07/02/18 1616  BP: (!) 172/76 (!) 160/76  Pulse: 74   Resp: 18   Temp: 97.8 F (36.6 C)   TempSrc: Oral   SpO2: 95%   Weight: 273 lb (123.8 kg)      Physical Exam  General Appearance:    Alert, cooperative, no distress, obese  Eyes:    PERRL, conjunctiva/corneas clear, EOM's intact       Lungs:     Clear to auscultation bilaterally, respirations unlabored  Heart:    Regular rate and rhythm  Neurologic:   Awake, alert, oriented x 3. No apparent focal neurological           defect.       Results for orders placed or performed in  visit on 07/02/18  POCT HgB A1C  Result Value Ref Range   Hemoglobin A1C 6.8 (A) 4.0 - 5.6 %   HbA1c POC (<> result, manual entry)     HbA1c, POC (prediabetic range)     HbA1c, POC (controlled diabetic range)     Est. average glucose Bld gHb Est-mCnc 148         Assessment & Plan    1. Diabetes mellitus with nephropathy ([place]) Doing well with change from Bydureon to [place]. Is off of glipizide now. Continue current medications.  Recheck a1c 3  Onths.  - POCT HgB A1C  2. Coronary artery disease involving native coronary artery of native heart with unstable angina pectoris (HCC) Asymptomatic. Compliant with medication.  Continue aggressive risk factor modification.    3. Resistant hypertension Greatly improved after adding chlorthalidone, but up now since running out of carvedilol two weeks ago. Is sent new prescription to he pharmacy and will call to make sure they are able to fill - carvedilol (COREG) 25 MG tablet; Take 1 tablet (25 mg total) by mouth 2 (two) times daily.  Dispense: 180 tablet; Refill: 2 Will reduce- cloNIDine (CATAPRES) 0.2 MG tablet; Take 1 tablet (0.2 mg total) by mouth 2 (two) times daily.  Dispense: 180 tablet; Refill: 3 with next prescription due to be filled in about two weeks.      Lelon Huh, MD  [facility]

## 2018-08-01 ENCOUNTER — Telehealth: Payer: Self-pay

## 2018-08-01 NOTE — Telephone Encounter (Signed)
Patient is calling requesting call back from nurse to discuss his diabetic medication. Patient states that he is currently taking Lantus and Novolog as insulin and Ozempic once a week. Patient states nurse gave him a sample of [place]? And states that it reads as insulin and wants clarification. Patient would like call back when available. KW

## 2018-08-01 NOTE — Telephone Encounter (Signed)
Patient advised on the medication. He states that he now understands.

## 2018-08-01 NOTE — Telephone Encounter (Signed)
Please advise 

## 2018-08-01 NOTE — Telephone Encounter (Signed)
Basaglar and Lantus are different brands of the same type of insulin. They are interchangable. He can take on or the other, but not both.

## 2018-08-15 ENCOUNTER — Telehealth: Payer: Self-pay | Admitting: Urology

## 2018-08-15 NOTE — Telephone Encounter (Signed)
Called patient to schedule a virtual app and he declined the app and said this whole this was unnecessary and he did not want to go thru it. He pushed his app out until July and said the only thing he needed from you was a refill and when and if he needed that he would have his pharmacy call you.   Sharyn Lull

## 2018-08-27 ENCOUNTER — Ambulatory Visit: Payer: PPO | Admitting: Urology

## 2018-09-04 ENCOUNTER — Ambulatory Visit: Payer: PPO | Admitting: Urology

## 2018-09-11 ENCOUNTER — Other Ambulatory Visit: Payer: Self-pay | Admitting: Family Medicine

## 2018-09-11 DIAGNOSIS — I10 Essential (primary) hypertension: Secondary | ICD-10-CM

## 2018-09-19 ENCOUNTER — Telehealth: Payer: Self-pay | Admitting: Family Medicine

## 2018-09-19 NOTE — Telephone Encounter (Signed)
Is asking if there is any insulin supplement medication he can take that is not expensive.  All the ones he has taken lately are in the hundreds of dollars  CB#  [phone]  Pt is aware that Dr. Caryn Section wont be in into tomorrow  teri

## 2018-09-20 NOTE — Telephone Encounter (Signed)
Cheaper forms of basal insulin are available, but they have to be drawn from a bottle into a syringe with a long needle, they often cause hypoglycemia, and have to be taken twice a day instead of once.  We usually have samples of insulin pens, although we don't have any right right now. We should get some in next week.

## 2018-09-23 NOTE — Telephone Encounter (Signed)
Pt advised.  He is going to continue his current insulin for now.  He will call next week to see if any samples have come in.   Thanks,   -[name]

## 2018-09-25 ENCOUNTER — Telehealth: Payer: Self-pay

## 2018-09-25 NOTE — Telephone Encounter (Signed)
Patient reports he is returning Laura's call

## 2018-09-25 NOTE — Telephone Encounter (Signed)
Spoke with pt about his insulin.   Thanks,   -[name]

## 2018-10-02 ENCOUNTER — Telehealth: Payer: Self-pay | Admitting: Family Medicine

## 2018-10-02 DIAGNOSIS — G5601 Carpal tunnel syndrome, right upper limb: Secondary | ICD-10-CM | POA: Diagnosis not present

## 2018-10-02 NOTE — Telephone Encounter (Signed)
Please see if patient still needs samples of basal insulin. We do have samples of Basaglar which is the same thing as Lantus. He can have two boxes if he needs them.

## 2018-10-02 NOTE — Telephone Encounter (Signed)
Pt advised. He is going to pick the samples up at his appointment on 10/04/2018.   Thanks,   -[name]

## 2018-10-02 NOTE — Telephone Encounter (Signed)
-----   Message from [name], MD sent at 09/20/2018  9:53 AM EDT ----- Regarding: needs sample basal insulin pen

## 2018-10-04 ENCOUNTER — Encounter: Payer: Self-pay | Admitting: Family Medicine

## 2018-10-04 ENCOUNTER — Ambulatory Visit (INDEPENDENT_AMBULATORY_CARE_PROVIDER_SITE_OTHER): Payer: PPO | Admitting: Family Medicine

## 2018-10-04 ENCOUNTER — Other Ambulatory Visit: Payer: Self-pay

## 2018-10-04 VITALS — BP 177/106 | HR 69 | Temp 98.0°F | Resp 16 | Wt 278.0 lb

## 2018-10-04 DIAGNOSIS — E1121 Type 2 diabetes mellitus with diabetic nephropathy: Secondary | ICD-10-CM | POA: Diagnosis not present

## 2018-10-04 DIAGNOSIS — Z1211 Encounter for screening for malignant neoplasm of colon: Secondary | ICD-10-CM

## 2018-10-04 DIAGNOSIS — I10 Essential (primary) hypertension: Secondary | ICD-10-CM

## 2018-10-04 LAB — POCT GLYCOSYLATED HEMOGLOBIN (HGB A1C): Hemoglobin A1C: 6.5 % — AB (ref 4.0–5.6)

## 2018-10-04 NOTE — Patient Instructions (Addendum)
.   Please review the attached list of medications and notify my office if there are any errors.   Please go to the lab draw station in [address] on the second floor of [facility]  when you are fasting for 8 hours. Normal hours are 8:00am to 12:30pm and 1:30pm to 4:00pm Monday through Friday  

## 2018-10-04 NOTE — Progress Notes (Signed)
Patient: Jacob Herrera Male    DOB: [birth year]   68 y.o.   MRN: [id number] Visit Date: 10/04/2018  Today's Provider: Lelon Huh, MD   Chief Complaint  Patient presents with  . Diabetes  . Hypertension   Subjective:     HPI   Diabetes Mellitus Type II, Follow-up:   Lab Results  Component Value Date   HGBA1C 6.8 (A) 07/02/2018   HGBA1C 6.0 (A) 03/26/2018   HGBA1C 6.4 (A) 12/19/2017    Last seen for diabetes 3 months ago.  Management since then includes none. He reports excellent compliance with treatment. He is not having side effects. Current symptoms include polydipsia and visual disturbances and have been unchanged. Home blood sugar records: 140-180  Episodes of hypoglycemia? yes - over 200   Current Insulin Regimen: Lantus 80 units and Novolog 16units before lunch. Most Recent Eye Exam: 03/15/18 Weight trend: stable Prior visit with dietician: No Current exercise: none Current diet habits: not asked  Pertinent Labs:    Component Value Date/Time   CHOL 152 03/20/2017 1503   CHOL 183 11/23/2016 1053   TRIG 260 (H) 03/20/2017 1503   HDL 35 (L) 03/20/2017 1503   HDL 34 (L) 11/23/2016 1053   LDLCALC 83 03/20/2017 1503   CREATININE 1.53 (H) 12/21/2016 1527   CREATININE 1.36 (H) 07/08/2013 0555    Wt Readings from Last 3 Encounters:  10/04/18 278 lb (126.1 kg)  07/02/18 273 lb (123.8 kg)  05/21/18 275 lb (124.7 kg)    ------------------------------------------------------------------------  Hypertension, follow-up:  BP Readings from Last 3 Encounters:  10/04/18 (!) 177/106  07/02/18 (!) 160/76  05/21/18 (!) 180/94    He was last seen for hypertension 3 months ago.  BP at that visit was 160/76. Management changes since that visit include reducing clonidine to 0.2mg  BID. He reports excellent compliance with treatment. He is not having side effects.  He is not exercising. He is adherent to low salt diet.   Outside blood pressures are  . He is experiencing none.  Patient denies chest pain, chest pressure/discomfort, claudication, dyspnea, exertional chest pressure/discomfort, fatigue, irregular heart beat, lower extremity edema, near-syncope, orthopnea, palpitations, paroxysmal nocturnal dyspnea, syncope and tachypnea.   Cardiovascular risk factors include advanced age (older than 70 for men, 68 for women), hypertension and male gender.  Use of agents associated with hypertension: NSAIDS.     Weight trend: fluctuating a bit Wt Readings from Last 3 Encounters:  10/04/18 278 lb (126.1 kg)  07/02/18 273 lb (123.8 kg)  05/21/18 275 lb (124.7 kg)    Current diet: not asked  ------------------------------------------------------------------------   Allergies  Allergen Reactions  . Spironolactone     gynecomastia     Current Outpatient Medications:  .  albuterol (PROVENTIL) (2.5 MG/3ML) 0.083% nebulizer solution, Take 3 mLs (2.5 mg total) by nebulization every 4 (four) hours. And PRN, Disp: 75 mL, Rfl: 12 .  amLODipine (NORVASC) 10 MG tablet, Take 1 tablet (10 mg total) by mouth daily., Disp: 30 tablet, Rfl: 1 .  aspirin 81 MG tablet, Take 81 mg by mouth daily., Disp: , Rfl:  .  atorvastatin (LIPITOR) 80 MG tablet, TAKE 1 TABLET BY MOUTH AT BEDTIME, Disp: 90 tablet, Rfl: 4 .  BD PEN NEEDLE NANO U/F 32G X 4 MM MISC, USE DAILY AS DIRECTED, Disp: 100 each, Rfl: 3 .  budesonide-formoterol (SYMBICORT) 80-4.5 MCG/ACT inhaler, Inhale 2 puffs into the lungs 2 (two) times daily., Disp: 3 Inhaler, Rfl:  2 .  carvedilol (COREG) 25 MG tablet, Take 1 tablet (25 mg total) by mouth 2 (two) times daily., Disp: 180 tablet, Rfl: 2 .  chlorthalidone (HYGROTON) 25 MG tablet, TAKE 1 TABLET BY MOUTH EVERY DAY, Disp: 90 tablet, Rfl: 4 .  cloNIDine (CATAPRES) 0.2 MG tablet, Take 1 tablet (0.2 mg total) by mouth 2 (two) times daily., Disp: 180 tablet, Rfl: 3 .  clopidogrel (PLAVIX) 75 MG tablet, Take 1 tablet (75 mg total) by mouth daily.,  Disp: 30 tablet, Rfl: 11 .  Docusate Calcium (STOOL SOFTENER PO), Take 100 mg by mouth as needed. , Disp: , Rfl:  .  doxazosin (CARDURA) 8 MG tablet, TAKE 1 TABLET DAILY. DISCONTINUE FLOMAX, Disp: 30 tablet, Rfl: 5 .  finasteride (PROSCAR) 5 MG tablet, Take 1 tablet (5 mg total) by mouth daily., Disp: 30 tablet, Rfl: 11 .  fluticasone (FLONASE) 50 MCG/ACT nasal spray, Place 2 sprays into both nostrils daily., Disp: 16 g, Rfl: 2 .  furosemide (LASIX) 40 MG tablet, Take 40 mg by mouth 2 (two) times daily. , Disp: , Rfl:  .  hydrALAZINE (APRESOLINE) 50 MG tablet, Take 1 tablet (50 mg total) by mouth 3 (three) times daily., Disp: 270 tablet, Rfl: 1 .  insulin aspart (NOVOLOG FLEXPEN) 100 UNIT/ML FlexPen, Take 16 units before lunch., Disp: 15 mL, Rfl: 12 .  isosorbide mononitrate (IMDUR) 60 MG 24 hr tablet, Take 60 mg by mouth daily., Disp: , Rfl:  .  LANTUS SOLOSTAR 100 UNIT/ML Solostar Pen, TITRATE UP TO 80 UNITS, ONCE A DAY, Disp: 20 pen, Rfl: 3 .  levothyroxine (SYNTHROID, LEVOTHROID) 125 MCG tablet, TAKE 1 TABLET (125 MCG TOTAL) BY MOUTH DAILY., Disp: 90 tablet, Rfl: 4 .  losartan (COZAAR) 100 MG tablet, Take 1 tablet (100 mg total) by mouth daily., Disp: 30 tablet, Rfl: 1 .  Lysine 500 MG CAPS, Take 1 capsule by mouth daily., Disp: , Rfl:  .  Naproxen Sodium (ALEVE PO), Take by mouth., Disp: , Rfl:  .  ONE TOUCH ULTRA TEST test strip, USE ONE STRIP ONCE DAILY, Disp: 100 each, Rfl: 4 .  oxyCODONE (OXY IR/ROXICODONE) 5 MG immediate release tablet, Take 1 tablet (5 mg total) by mouth daily as needed for severe pain., Disp: 30 tablet, Rfl: 0 .  pantoprazole (PROTONIX) 40 MG tablet, Take 1 tablet (40 mg total) by mouth daily before breakfast., Disp: 30 tablet, Rfl: 3 .  Semaglutide, 1 MG/DOSE, (OZEMPIC, 1 MG/DOSE,) 2 MG/1.5ML SOPN, Inject 1 mg into the skin once a week., Disp: 1 pen, Rfl: 11  Review of Systems  Constitutional: Negative.   HENT: Negative.   Cardiovascular: Negative.    Gastrointestinal: Negative.   Endocrine: Positive for polydipsia.  Musculoskeletal: Negative.     Social History   Tobacco Use  . Smoking status: Former Smoker    Packs/day: 1.00    Years: 25.00    Pack years: 25.00    Types: Cigarettes    Quit date: 05/29/1988    Years since quitting: 30.3  . Smokeless tobacco: Current User    Types: Chew  . Tobacco comment: chews 1 bag of loose leaf chew/week  Substance Use Topics  . Alcohol use: No    Alcohol/week: 0.0 standard drinks      Objective:   BP (!) 177/106   Pulse 69   Temp 98 F (36.7 C) (Oral)   Resp 16   Wt 278 lb (126.1 kg)   BMI 42.27 kg/m  Vitals:  10/04/18 1600  BP: (!) 177/106  Pulse: 69  Resp: 16  Temp: 98 F (36.7 C)  TempSrc: Oral  Weight: 278 lb (126.1 kg)     Physical Exam  General Appearance:    Alert, cooperative, no distress, obese  Eyes:    PERRL, conjunctiva/corneas clear, EOM's intact       Lungs:     Clear to auscultation bilaterally, respirations unlabored  Heart:    Regular rate and rhythm  Neurologic:   Awake, alert, oriented x 3. No apparent focal neurological           defect.       Lab Results  Component Value Date   HGBA1C 6.5 (A) 10/04/2018       Assessment & Plan    1. Diabetes mellitus with nephropathy ([place]) Well controlled.  Continue current medications.   - POCT glycosylated hemoglobin (Hb A1C) - Lipid panel - Comprehensive metabolic panel  2. Resistant hypertension Initially improved with chlorthalidone, but now worsening again. Consider increasing hydralazine after reviewing labs.   3. Colon cancer screening  - IFOBT POC (occult bld, rslt in office); Future  4. Morbid Obesity No significant weight loss with Ozempic. Encouraged health diet. Consider Saxenda if continues to maintain weight.     Lelon Huh, MD  [facility] Clint Bolder as a scribe for Lelon Huh, MD.,have documented all relevant  documentation on the behalf of Lelon Huh, MD,as directed by  Lelon Huh, MD while in the presence of Lelon Huh, MD.

## 2018-10-07 ENCOUNTER — Other Ambulatory Visit: Payer: Self-pay

## 2018-10-07 ENCOUNTER — Telehealth: Payer: Self-pay

## 2018-10-07 DIAGNOSIS — E1121 Type 2 diabetes mellitus with diabetic nephropathy: Secondary | ICD-10-CM | POA: Diagnosis not present

## 2018-10-07 DIAGNOSIS — Z1211 Encounter for screening for malignant neoplasm of colon: Secondary | ICD-10-CM

## 2018-10-07 LAB — IFOBT (OCCULT BLOOD): IFOBT: POSITIVE

## 2018-10-07 NOTE — Telephone Encounter (Signed)
See result note. Advised needs gi referral

## 2018-10-07 NOTE — Telephone Encounter (Signed)
Jacob Herrera dropped off his OC Light.  It was positive.    Thanks,   -[name]

## 2018-10-08 ENCOUNTER — Telehealth: Payer: Self-pay

## 2018-10-08 DIAGNOSIS — R195 Other fecal abnormalities: Secondary | ICD-10-CM

## 2018-10-08 DIAGNOSIS — Z1211 Encounter for screening for malignant neoplasm of colon: Secondary | ICD-10-CM

## 2018-10-08 LAB — LIPID PANEL
Chol/HDL Ratio: 4.3 ratio (ref 0.0–5.0)
Cholesterol, Total: 155 mg/dL (ref 100–199)
HDL: 36 mg/dL — ABNORMAL LOW (ref >39)
LDL Calculated: 70 mg/dL (ref 0–99)
Triglycerides: 247 mg/dL — ABNORMAL HIGH (ref 0–149)
VLDL Cholesterol Cal: 49 mg/dL — ABNORMAL HIGH (ref 5–40)

## 2018-10-08 LAB — COMPREHENSIVE METABOLIC PANEL
ALT: 17 IU/L (ref 0–44)
AST: 23 IU/L (ref 0–40)
Albumin/Globulin Ratio: 1.8 (ref 1.2–2.2)
Albumin: 4.2 g/dL (ref 3.8–4.8)
Alkaline Phosphatase: 47 IU/L (ref 39–117)
BUN/Creatinine Ratio: 18 (ref 10–24)
BUN: 33 mg/dL — ABNORMAL HIGH (ref 8–27)
Bilirubin Total: 0.2 mg/dL (ref 0.0–1.2)
CO2: 22 mmol/L (ref 20–29)
Calcium: 10 mg/dL (ref 8.6–10.2)
Chloride: 99 mmol/L (ref 96–106)
Creatinine, Ser: 1.85 mg/dL — ABNORMAL HIGH (ref 0.76–1.27)
GFR calc Af Amer: 42 mL/min/{1.73_m2} — ABNORMAL LOW (ref >59)
GFR calc non Af Amer: 36 mL/min/{1.73_m2} — ABNORMAL LOW (ref >59)
Globulin, Total: 2.4 g/dL (ref 1.5–4.5)
Glucose: 134 mg/dL — ABNORMAL HIGH (ref 65–99)
Potassium: 3.8 mmol/L (ref 3.5–5.2)
Sodium: 137 mmol/L (ref 134–144)
Total Protein: 6.6 g/dL (ref 6.0–8.5)

## 2018-10-08 MED ORDER — HYDRALAZINE HCL 100 MG PO TABS: 100.0000 mg | ORAL_TABLET | Freq: Three times a day (TID) | ORAL | 2 refills | Status: DC

## 2018-10-08 NOTE — Telephone Encounter (Signed)
Pt called back.  teri

## 2018-10-08 NOTE — Telephone Encounter (Signed)
-----   Message from [name], MD sent at 10/07/2018 10:44 AM EDT ----- Please advise OC lyte is positive, needs referral to gi for colonoscopy.

## 2018-10-08 NOTE — Telephone Encounter (Signed)
Patient advised. RX sent to [facility] and follow up appointment scheduled.

## 2018-10-08 NOTE — Telephone Encounter (Signed)
-----   Message from [name], MD sent at 10/08/2018  1:13 PM EDT ----- Cholesterol well controlled at 155. Kidney functions slightly worse. Try to drink more water. Rest of labs or normal. Need to increase hydralazine to 100mg  three times daily to get BP under better control. #90 rf x 2. Schedule follow up 1 months for BP check.

## 2018-10-08 NOTE — Telephone Encounter (Signed)
Tried calling; pt's voicemail is not set up.   Thanks,   -Laura  

## 2018-10-08 NOTE — Telephone Encounter (Signed)
Pt advised.  Pt agreed to the referral.   Thanks,   -[name]

## 2018-10-23 DIAGNOSIS — R195 Other fecal abnormalities: Secondary | ICD-10-CM | POA: Diagnosis not present

## 2018-10-23 DIAGNOSIS — Z86010 Personal history of colonic polyps: Secondary | ICD-10-CM | POA: Diagnosis not present

## 2018-10-23 DIAGNOSIS — K219 Gastro-esophageal reflux disease without esophagitis: Secondary | ICD-10-CM | POA: Diagnosis not present

## 2018-10-23 DIAGNOSIS — K5909 Other constipation: Secondary | ICD-10-CM | POA: Diagnosis not present

## 2018-11-11 ENCOUNTER — Other Ambulatory Visit: Payer: Self-pay | Admitting: Urology

## 2018-11-11 DIAGNOSIS — N401 Enlarged prostate with lower urinary tract symptoms: Secondary | ICD-10-CM

## 2018-11-11 MED ORDER — FINASTERIDE 5 MG PO TABS: 5.0000 mg | ORAL_TABLET | Freq: Every day | ORAL | 0 refills | Status: DC

## 2018-11-11 NOTE — Telephone Encounter (Signed)
Patient is asking for a refill on his finasteride until he gets here for his app with [place] in 11-14-18. He is out  He has changed pharmacies and I updated it in Epic to [facility]

## 2018-11-11 NOTE — Addendum Note (Signed)
Addended by: Kyra Manges on: 11/11/2018 02:48 PM   Modules accepted: Orders

## 2018-11-11 NOTE — Telephone Encounter (Signed)
Rx was sent to pharmacy. 

## 2018-11-12 ENCOUNTER — Ambulatory Visit: Payer: PPO | Admitting: Family Medicine

## 2018-11-14 ENCOUNTER — Other Ambulatory Visit: Payer: Self-pay

## 2018-11-14 ENCOUNTER — Encounter: Payer: Self-pay | Admitting: Urology

## 2018-11-14 ENCOUNTER — Ambulatory Visit (INDEPENDENT_AMBULATORY_CARE_PROVIDER_SITE_OTHER): Payer: PPO | Admitting: Urology

## 2018-11-14 DIAGNOSIS — N401 Enlarged prostate with lower urinary tract symptoms: Secondary | ICD-10-CM

## 2018-11-14 MED ORDER — FINASTERIDE 5 MG PO TABS: 5.0000 mg | ORAL_TABLET | Freq: Every day | ORAL | 2 refills | Status: DC

## 2018-11-14 NOTE — Progress Notes (Signed)
11/14/2018 9:30 PM   Jacob Herrera Apr 23, 1951 [id number]  Referring provider: [name], MD [address] [place],   [address]  Chief Complaint  Patient presents with  . Benign Prostatic Hypertrophy    Urologic history: 1.  BPH with lower urinary tract symptoms  -Combination therapy tamsulosin 0.8 mg/finasteride   HPI: 68 year old male presents for annual follow-up BPH.  He saw Dr. Pilar Jarvis last year and had stable lower urinary tract symptoms.  He was on tamsulosin 0.8 mg however states his nephrologist discontinued this medication.  Dr. Caryn Section put him on doxazosin 8 mg daily which he has tolerated without problems.  He also remains on finasteride.  He voids with an excellent stream.  He gets up once per night to void.  He does have urinary frequency and urgency.  IPSS completed today was 8/1.  Denies dysuria or gross hematuria.  Denies flank, abdominal, pelvic or scrotal pain.  PSA May 2019 was 1.4 (uncorrected)   PMH: Past Medical History:  Diagnosis Date  . History of chicken pox   . Hx of skin cancer, basal cell   . Kidney disease   . Scoliosis   . Thyrotoxicosis    2010    Surgical History: Past Surgical History:  Procedure Laterality Date  . CARDIAC CATHETERIZATION N/A 03/31/2015   Procedure: Left Heart Cath and Coronary Angiography;  Surgeon: Teodoro Spray, MD;  Location: [facility];  Service: Cardiovascular;  Laterality: N/A;  . CARDIAC CATHETERIZATION N/A 03/31/2015   Procedure: Coronary Stent Intervention;  Surgeon: Teodoro Spray, MD;  Location: [facility];  Service: Cardiovascular;  Laterality: N/A;  . CARDIAC CATHETERIZATION N/A 03/31/2015   Procedure: Coronary Stent Intervention;  Surgeon: Yolonda Kida, MD;  Location: [facility];  Service: Cardiovascular;  Laterality: N/A;  . NASAL SINUS SURGERY  2005  . TONSILLECTOMY AND ADENOIDECTOMY  1950    Home Medications:  Allergies as of 11/14/2018       Reactions   Spironolactone    gynecomastia      Medication List       Accurate as of November 14, 2018  9:30 PM. If you have any questions, ask your nurse or doctor.        albuterol (2.5 MG/3ML) 0.083% nebulizer solution Commonly known as: PROVENTIL Take 3 mLs (2.5 mg total) by nebulization every 4 (four) hours. And PRN   ALEVE PO Take by mouth.   amLODipine 10 MG tablet Commonly known as: NORVASC Take 1 tablet (10 mg total) by mouth daily.   aspirin 81 MG tablet Take 81 mg by mouth daily.   atorvastatin 80 MG tablet Commonly known as: LIPITOR TAKE 1 TABLET BY MOUTH AT BEDTIME   BD Pen Needle Nano U/F 32G X 4 MM Misc Generic drug: Insulin Pen Needle USE DAILY AS DIRECTED   budesonide-formoterol 80-4.5 MCG/ACT inhaler Commonly known as: Symbicort Inhale 2 puffs into the lungs 2 (two) times daily.   carvedilol 25 MG tablet Commonly known as: COREG Take 1 tablet (25 mg total) by mouth 2 (two) times daily.   chlorthalidone 25 MG tablet Commonly known as: HYGROTON TAKE 1 TABLET BY MOUTH EVERY DAY   cloNIDine 0.2 MG tablet Commonly known as: CATAPRES Take 1 tablet (0.2 mg total) by mouth 2 (two) times daily.   clopidogrel 75 MG tablet Commonly known as: Plavix Take 1 tablet (75 mg total) by mouth daily.   doxazosin 8 MG tablet Commonly known  as: CARDURA TAKE 1 TABLET DAILY. DISCONTINUE FLOMAX   finasteride 5 MG tablet Commonly known as: PROSCAR Take 1 tablet (5 mg total) by mouth daily.   fluticasone 50 MCG/ACT nasal spray Commonly known as: FLONASE Place 2 sprays into both nostrils daily.   furosemide 40 MG tablet Commonly known as: LASIX Take 40 mg by mouth 2 (two) times daily.   hydrALAZINE 100 MG tablet Commonly known as: APRESOLINE Take 1 tablet (100 mg total) by mouth 3 (three) times daily.   insulin aspart 100 UNIT/ML FlexPen Commonly known as: NovoLOG FlexPen Take 16 units before lunch.   isosorbide mononitrate 60 MG 24 hr tablet  Commonly known as: IMDUR Take 60 mg by mouth daily.   Lantus SoloStar 100 UNIT/ML Solostar Pen Generic drug: Insulin Glargine TITRATE UP TO 80 UNITS, ONCE A DAY   levothyroxine 125 MCG tablet Commonly known as: SYNTHROID TAKE 1 TABLET (125 MCG TOTAL) BY MOUTH DAILY.   losartan 100 MG tablet Commonly known as: COZAAR Take 1 tablet (100 mg total) by mouth daily.   Lysine 500 MG Caps Take 1 capsule by mouth daily.   ONE TOUCH ULTRA TEST test strip Generic drug: glucose blood USE ONE STRIP ONCE DAILY   oxyCODONE 5 MG immediate release tablet Commonly known as: Oxy IR/ROXICODONE Take 1 tablet (5 mg total) by mouth daily as needed for severe pain.   pantoprazole 40 MG tablet Commonly known as: PROTONIX Take 1 tablet (40 mg total) by mouth daily before breakfast.   Semaglutide (1 MG/DOSE) 2 MG/1.5ML Sopn Commonly known as: Ozempic (1 MG/DOSE) Inject 1 mg into the skin once a week.   STOOL SOFTENER PO Take 100 mg by mouth as needed.       Allergies:  Allergies  Allergen Reactions  . Spironolactone     gynecomastia    Family History: Family History  Problem Relation Age of Onset  . Emphysema Father   . Cancer - Lung Father   . Cancer - Ovarian Mother        29's  . Breast cancer Mother        late 57's    Social History:  reports that he quit smoking about 30 years ago. His smoking use included cigarettes. He has a 25.00 pack-year smoking history. His smokeless tobacco use includes chew. He reports that he does not drink alcohol or use drugs.  ROS: UROLOGY Frequent Urination?: No Hard to postpone urination?: No Burning/pain with urination?: No Get up at night to urinate?: No Leakage of urine?: No Urine stream starts and stops?: No Trouble starting stream?: No Do you have to strain to urinate?: No Blood in urine?: No Urinary tract infection?: No Sexually transmitted disease?: No Injury to kidneys or bladder?: No Painful intercourse?: No Weak stream?:  No Erection problems?: No Penile pain?: No  Gastrointestinal Nausea?: No Vomiting?: No Indigestion/heartburn?: No Diarrhea?: No Constipation?: No  Constitutional Fever: No Night sweats?: No Weight loss?: No Fatigue?: No  Skin Skin rash/lesions?: No Itching?: No  Eyes Blurred vision?: No Double vision?: No  Ears/Nose/Throat Sore throat?: No Sinus problems?: No  Hematologic/Lymphatic Swollen glands?: No Easy bruising?: No  Cardiovascular Leg swelling?: No Chest pain?: No  Respiratory Cough?: No Shortness of breath?: No  Endocrine Excessive thirst?: No  Musculoskeletal Back pain?: No Joint pain?: No  Neurological Headaches?: No Dizziness?: No  Psychologic Depression?: No Anxiety?: No  Physical Exam: BP (!) 149/69 (BP Location: Left Arm, Patient Position: Sitting, Cuff Size: Normal)   Pulse  63   Ht 5\' 8"  (1.727 m)   Wt 260 lb (117.9 kg)   BMI 39.53 kg/m   Constitutional:  Alert and oriented, No acute distress. HEENT: [place] AT, moist mucus membranes.  Trachea midline, no masses. Cardiovascular: No clubbing, cyanosis, or edema. Respiratory: Normal respiratory effort, no increased work of breathing. GI: Abdomen is soft, nontender, nondistended, no abdominal masses GU: No CVA tenderness.  Prostate 30 g, smooth without nodules Skin: No rashes, bruises or suspicious lesions. Neurologic: Grossly intact, no focal deficits, moving all 4 extremities. Psychiatric: Normal mood and affect.   Assessment & Plan:   68 year old male with BPH and stable lower urinary tract symptoms on doxazosin and finasteride.  His voiding symptoms are not bothersome.  Finasteride was refilled.  We discussed prostate cancer screening guidelines.  His PSA has been normal and stable and he was informed screening is no longer recommended after age 62.  Continue annual follow-up he was instructed to call earlier for any significant change in his voiding symptoms.  [name],  [facility] [address] [place], [place] [address] [phone]

## 2018-11-19 ENCOUNTER — Other Ambulatory Visit: Payer: Self-pay | Admitting: Family Medicine

## 2018-11-23 ENCOUNTER — Other Ambulatory Visit: Payer: Self-pay | Admitting: Family Medicine

## 2018-11-23 DIAGNOSIS — I10 Essential (primary) hypertension: Secondary | ICD-10-CM

## 2018-11-24 MED ORDER — HYDRALAZINE HCL 100 MG PO TABS: 100.0000 mg | ORAL_TABLET | Freq: Three times a day (TID) | ORAL | 3 refills | Status: DC

## 2018-11-26 ENCOUNTER — Encounter: Payer: Self-pay | Admitting: Family Medicine

## 2018-11-26 ENCOUNTER — Ambulatory Visit (INDEPENDENT_AMBULATORY_CARE_PROVIDER_SITE_OTHER): Payer: PPO | Admitting: Family Medicine

## 2018-11-26 ENCOUNTER — Other Ambulatory Visit: Payer: Self-pay

## 2018-11-26 VITALS — BP 142/60 | HR 61 | Temp 97.6°F | Resp 20 | Wt 276.0 lb

## 2018-11-26 DIAGNOSIS — G5601 Carpal tunnel syndrome, right upper limb: Secondary | ICD-10-CM

## 2018-11-26 DIAGNOSIS — I10 Essential (primary) hypertension: Secondary | ICD-10-CM | POA: Diagnosis not present

## 2018-11-26 NOTE — Progress Notes (Signed)
Patient: Jacob Herrera Male    DOB: 1950/05/02   68 y.o.   MRN: [id number] Visit Date: 11/26/2018  Today's Provider: Lelon Huh, MD   Chief Complaint  Patient presents with  . Hypertension   Subjective:     HPI  Hypertension, follow-up:  BP Readings from Last 3 Encounters:  11/26/18 (!) 142/60  11/14/18 (!) 149/69  10/04/18 (!) 177/106    He was last seen for hypertension 6 weeks ago.  BP at that visit was 177/106. Management since that visit includes increasing Hydralazine to 100mg  three times daily. He reports good compliance with treatment. He is not having side effects.  He is not exercising. He is adherent to low salt diet.   Outside blood pressures are not being checked. He is experiencing dyspnea and lower extremity edema.  Patient denies chest pain, chest pressure/discomfort, claudication, exertional chest pressure/discomfort, irregular heart beat, near-syncope, orthopnea, palpitations, paroxysmal nocturnal dyspnea, syncope and tachypnea.   Cardiovascular risk factors include diabetes mellitus, hypertension and male gender.  Use of agents associated with hypertension: NSAIDS.     Weight trend: increasing steadily Wt Readings from Last 3 Encounters:  11/26/18 276 lb (125.2 kg)  11/14/18 260 lb (117.9 kg)  10/04/18 278 lb (126.1 kg)    Current diet: in general, an "unhealthy" diet  ------------------------------------------------------------------------  Also states he has been having more trouble with his carpal tunnel syndrome on right and has had several injections at [facility] orthopedics, which provide some relief but doesn't last very long. He has been thinking about having surgery, but would like another opinion about surgery  Allergies  Allergen Reactions  . Spironolactone     gynecomastia     Current Outpatient Medications:  .  albuterol (PROVENTIL) (2.5 MG/3ML) 0.083% nebulizer solution, Take 3 mLs (2.5 mg total) by nebulization every 4  (four) hours. And PRN, Disp: 75 mL, Rfl: 12 .  amLODipine (NORVASC) 10 MG tablet, Take 1 tablet (10 mg total) by mouth daily., Disp: 30 tablet, Rfl: 1 .  aspirin 81 MG tablet, Take 81 mg by mouth daily., Disp: , Rfl:  .  atorvastatin (LIPITOR) 80 MG tablet, TAKE 1 TABLET BY MOUTH AT BEDTIME, Disp: 90 tablet, Rfl: 4 .  BD PEN NEEDLE NANO U/F 32G X 4 MM MISC, USE DAILY AS DIRECTED, Disp: 100 each, Rfl: 3 .  budesonide-formoterol (SYMBICORT) 80-4.5 MCG/ACT inhaler, Inhale 2 puffs into the lungs 2 (two) times daily., Disp: 3 Inhaler, Rfl: 2 .  carvedilol (COREG) 25 MG tablet, Take 1 tablet (25 mg total) by mouth 2 (two) times daily., Disp: 180 tablet, Rfl: 2 .  chlorthalidone (HYGROTON) 25 MG tablet, TAKE 1 TABLET BY MOUTH EVERY DAY, Disp: 90 tablet, Rfl: 4 .  cloNIDine (CATAPRES) 0.2 MG tablet, Take 1 tablet (0.2 mg total) by mouth 2 (two) times daily., Disp: 180 tablet, Rfl: 3 .  clopidogrel (PLAVIX) 75 MG tablet, Take 1 tablet (75 mg total) by mouth daily., Disp: 30 tablet, Rfl: 11 .  Docusate Calcium (STOOL SOFTENER PO), Take 100 mg by mouth as needed. , Disp: , Rfl:  .  doxazosin (CARDURA) 8 MG tablet, TAKE 1 TABLET DAILY. DISCONTINUE FLOMAX, Disp: 30 tablet, Rfl: 5 .  finasteride (PROSCAR) 5 MG tablet, Take 1 tablet (5 mg total) by mouth daily., Disp: 90 tablet, Rfl: 2 .  fluticasone (FLONASE) 50 MCG/ACT nasal spray, Place 2 sprays into both nostrils daily., Disp: 16 g, Rfl: 2 .  furosemide (LASIX) 40 MG  tablet, Take 40 mg by mouth 2 (two) times daily. , Disp: , Rfl:  .  hydrALAZINE (APRESOLINE) 100 MG tablet, Take 1 tablet (100 mg total) by mouth 3 (three) times daily., Disp: 270 tablet, Rfl: 3 .  insulin aspart (NOVOLOG FLEXPEN) 100 UNIT/ML FlexPen, Take 16 units before lunch., Disp: 15 mL, Rfl: 12 .  isosorbide mononitrate (IMDUR) 60 MG 24 hr tablet, Take 60 mg by mouth daily., Disp: , Rfl:  .  LANTUS SOLOSTAR 100 UNIT/ML Solostar Pen, TITRATE UP TO 80 UNITS, ONCE A DAY, Disp: 20 pen, Rfl: 3  .  levothyroxine (SYNTHROID, LEVOTHROID) 125 MCG tablet, TAKE 1 TABLET (125 MCG TOTAL) BY MOUTH DAILY., Disp: 90 tablet, Rfl: 4 .  losartan (COZAAR) 100 MG tablet, Take 1 tablet (100 mg total) by mouth daily., Disp: 30 tablet, Rfl: 1 .  Lysine 500 MG CAPS, Take 1 capsule by mouth daily., Disp: , Rfl:  .  Naproxen Sodium (ALEVE PO), Take by mouth., Disp: , Rfl:  .  ONETOUCH ULTRA test strip, USE ONE STRIP ONCE DAILY, Disp: 100 strip, Rfl: 4 .  oxyCODONE (OXY IR/ROXICODONE) 5 MG immediate release tablet, Take 1 tablet (5 mg total) by mouth daily as needed for severe pain., Disp: 30 tablet, Rfl: 0 .  pantoprazole (PROTONIX) 40 MG tablet, Take 1 tablet (40 mg total) by mouth daily before breakfast., Disp: 30 tablet, Rfl: 3 .  Semaglutide, 1 MG/DOSE, (OZEMPIC, 1 MG/DOSE,) 2 MG/1.5ML SOPN, Inject 1 mg into the skin once a week., Disp: 1 pen, Rfl: 11 .  SENNA PO, Take 3 capsules by mouth daily., Disp: , Rfl:   Review of Systems  Constitutional: Negative for appetite change, chills and fever.  Respiratory: Negative for chest tightness, shortness of breath and wheezing.   Cardiovascular: Positive for leg swelling (in feet and ankles). Negative for chest pain and palpitations.  Gastrointestinal: Negative for abdominal pain, nausea and vomiting.  Musculoskeletal: Positive for back pain.    Social History   Tobacco Use  . Smoking status: Former Smoker    Packs/day: 1.00    Years: 25.00    Pack years: 25.00    Types: Cigarettes    Quit date: 05/29/1988    Years since quitting: 30.5  . Smokeless tobacco: Current User    Types: Chew  . Tobacco comment: chews 1 bag of loose leaf chew/week  Substance Use Topics  . Alcohol use: No    Alcohol/week: 0.0 standard drinks      Objective:   BP (!) 142/60 (BP Location: Left Arm, Patient Position: Sitting, Cuff Size: Large)   Pulse 61   Temp 97.6 F (36.4 C) (Oral)   Resp 20   Wt 276 lb (125.2 kg)   SpO2 97% Comment: room air  BMI 41.97 kg/m   Vitals:   11/26/18 1346  BP: (!) 142/60  Pulse: 61  Resp: 20  Temp: 97.6 F (36.4 C)  TempSrc: Oral  SpO2: 97%  Weight: 276 lb (125.2 kg)     Physical Exam   General Appearance:    Alert, cooperative, no distress, obese  Eyes:    PERRL, conjunctiva/corneas clear, EOM's intact       Lungs:     Clear to auscultation bilaterally, respirations unlabored  Heart:    Normal heart rate. Normal rhythm. No murmurs, rubs, or gallops.   Neurologic:   Awake, alert, oriented x 3. No apparent focal neurological           defect.  Assessment & Plan    1. Carpal tunnel syndrome on right He requests second opinion at [facility] - AMB referral to orthopedics  2. Resistant hypertension Much better on increased dose of hydralazine. Continue current medications.  Return for dm follow up in September.    The entirety of the information documented in the History of Present Illness, Review of Systems and Physical Exam were personally obtained by me. Portions of this information were initially documented by Meyer Cory, CMA and reviewed by me for thoroughness and accuracy.      Lelon Huh, MD  [facility]

## 2018-11-26 NOTE — Patient Instructions (Signed)
.   Please review the attached list of medications and notify my office if there are any errors.   . Please bring all of your medications to every appointment so we can make sure that our medication list is the same as yours.   . We will have flu vaccines available after Labor Day. Please go to your pharmacy or call the office in early September to schedule you flu shot.   

## 2018-11-27 ENCOUNTER — Telehealth: Payer: Self-pay | Admitting: Family Medicine

## 2018-11-27 NOTE — Telephone Encounter (Signed)
I spoke with a representative at [facility]. She stated the code E11 was an "Off label" code for Lantus.  She states it needs to be a more specific code.  I asked her what code she needed she stated she can not tell me.  (Maybe Diabetes with insulin use).  I put the form on your desk to change the code.   Thanks,   -[name]

## 2018-11-27 NOTE — Telephone Encounter (Signed)
Pt has another drug company needing something faxed on pt's assistance with medication. It is not the same as what he dropped off this morning.  Please call pt back to discuss.  Thanks, [employer]

## 2018-12-01 ENCOUNTER — Other Ambulatory Visit: Payer: Self-pay | Admitting: Family Medicine

## 2018-12-01 DIAGNOSIS — I5022 Chronic systolic (congestive) heart failure: Secondary | ICD-10-CM | POA: Diagnosis not present

## 2018-12-02 ENCOUNTER — Telehealth: Payer: Self-pay | Admitting: Family Medicine

## 2018-12-02 NOTE — Telephone Encounter (Signed)
Patient wants to talk to [name] about his patient assistance application.

## 2018-12-09 ENCOUNTER — Other Ambulatory Visit: Payer: Self-pay | Admitting: Family Medicine

## 2018-12-09 MED ORDER — DOXAZOSIN MESYLATE 8 MG PO TABS: 8.0000 mg | ORAL_TABLET | Freq: Every day | ORAL | 3 refills | Status: DC

## 2018-12-09 NOTE — Telephone Encounter (Signed)
Pt called asking about the patient assistance application   CB#  [phone]  [place]

## 2018-12-10 ENCOUNTER — Other Ambulatory Visit: Payer: Self-pay | Admitting: Family Medicine

## 2018-12-10 MED ORDER — ONETOUCH ULTRA VI STRP: ORAL_STRIP | 4 refills | Status: DC

## 2018-12-11 DIAGNOSIS — C44311 Basal cell carcinoma of skin of nose: Secondary | ICD-10-CM | POA: Diagnosis not present

## 2018-12-11 DIAGNOSIS — Z08 Encounter for follow-up examination after completed treatment for malignant neoplasm: Secondary | ICD-10-CM | POA: Diagnosis not present

## 2018-12-11 DIAGNOSIS — L570 Actinic keratosis: Secondary | ICD-10-CM | POA: Diagnosis not present

## 2018-12-11 DIAGNOSIS — Z85820 Personal history of malignant melanoma of skin: Secondary | ICD-10-CM | POA: Diagnosis not present

## 2018-12-11 DIAGNOSIS — X32XXXA Exposure to sunlight, initial encounter: Secondary | ICD-10-CM | POA: Diagnosis not present

## 2018-12-11 DIAGNOSIS — Z09 Encounter for follow-up examination after completed treatment for conditions other than malignant neoplasm: Secondary | ICD-10-CM | POA: Diagnosis not present

## 2018-12-11 DIAGNOSIS — D225 Melanocytic nevi of trunk: Secondary | ICD-10-CM | POA: Diagnosis not present

## 2018-12-11 DIAGNOSIS — D0439 Carcinoma in situ of skin of other parts of face: Secondary | ICD-10-CM | POA: Diagnosis not present

## 2018-12-11 DIAGNOSIS — C44319 Basal cell carcinoma of skin of other parts of face: Secondary | ICD-10-CM | POA: Diagnosis not present

## 2018-12-11 DIAGNOSIS — Z872 Personal history of diseases of the skin and subcutaneous tissue: Secondary | ICD-10-CM | POA: Diagnosis not present

## 2018-12-11 DIAGNOSIS — Z85828 Personal history of other malignant neoplasm of skin: Secondary | ICD-10-CM | POA: Diagnosis not present

## 2018-12-11 DIAGNOSIS — D485 Neoplasm of uncertain behavior of skin: Secondary | ICD-10-CM | POA: Diagnosis not present

## 2018-12-11 NOTE — Telephone Encounter (Signed)
That fine, he can have 2 basaglar sample pens.

## 2018-12-11 NOTE — Telephone Encounter (Signed)
Tried calling; no answer.   Thanks,   -Elaysia Devargas  

## 2018-12-11 NOTE — Telephone Encounter (Signed)
Pt states he has not heard from the assistance program.  I advised him the forms were resubmitted last week with the new ICD - 10 code.  He states he is going to give him a call to see if they have it.  Also he would like samples of Ozempic and Lantus.  We don't have Lantus but we do have Basaglar if he can take that in place of lantus.    Please advise.   Thanks,   -[name]

## 2018-12-12 NOTE — Telephone Encounter (Signed)
Patient advised as below.  

## 2018-12-12 NOTE — Telephone Encounter (Signed)
Is it okay to give him Ozempic as well?  Thanks,   -[name]

## 2018-12-12 NOTE — Telephone Encounter (Signed)
Yes, he can have 2 sample pens of the 1mg  Ozempic if we have them.

## 2018-12-17 DIAGNOSIS — I25110 Atherosclerotic heart disease of native coronary artery with unstable angina pectoris: Secondary | ICD-10-CM | POA: Diagnosis not present

## 2018-12-17 DIAGNOSIS — E1121 Type 2 diabetes mellitus with diabetic nephropathy: Secondary | ICD-10-CM | POA: Diagnosis not present

## 2018-12-17 DIAGNOSIS — G4733 Obstructive sleep apnea (adult) (pediatric): Secondary | ICD-10-CM | POA: Diagnosis not present

## 2018-12-17 DIAGNOSIS — Z6841 Body Mass Index (BMI) 40.0 and over, adult: Secondary | ICD-10-CM | POA: Diagnosis not present

## 2018-12-17 DIAGNOSIS — Z794 Long term (current) use of insulin: Secondary | ICD-10-CM | POA: Diagnosis not present

## 2018-12-17 DIAGNOSIS — G5601 Carpal tunnel syndrome, right upper limb: Secondary | ICD-10-CM | POA: Diagnosis not present

## 2018-12-17 DIAGNOSIS — I5032 Chronic diastolic (congestive) heart failure: Secondary | ICD-10-CM | POA: Diagnosis not present

## 2018-12-17 DIAGNOSIS — I10 Essential (primary) hypertension: Secondary | ICD-10-CM | POA: Diagnosis not present

## 2018-12-17 DIAGNOSIS — N183 Chronic kidney disease, stage 3 (moderate): Secondary | ICD-10-CM | POA: Diagnosis not present

## 2018-12-18 DIAGNOSIS — E872 Acidosis: Secondary | ICD-10-CM | POA: Diagnosis not present

## 2018-12-18 DIAGNOSIS — N183 Chronic kidney disease, stage 3 (moderate): Secondary | ICD-10-CM | POA: Diagnosis not present

## 2018-12-18 DIAGNOSIS — I129 Hypertensive chronic kidney disease with stage 1 through stage 4 chronic kidney disease, or unspecified chronic kidney disease: Secondary | ICD-10-CM | POA: Diagnosis not present

## 2018-12-18 DIAGNOSIS — E875 Hyperkalemia: Secondary | ICD-10-CM | POA: Diagnosis not present

## 2018-12-18 DIAGNOSIS — R809 Proteinuria, unspecified: Secondary | ICD-10-CM | POA: Diagnosis not present

## 2019-01-01 DIAGNOSIS — I5022 Chronic systolic (congestive) heart failure: Secondary | ICD-10-CM | POA: Diagnosis not present

## 2019-01-02 DIAGNOSIS — G5601 Carpal tunnel syndrome, right upper limb: Secondary | ICD-10-CM | POA: Diagnosis not present

## 2019-01-13 ENCOUNTER — Other Ambulatory Visit: Payer: Self-pay | Admitting: Family Medicine

## 2019-01-13 DIAGNOSIS — E785 Hyperlipidemia, unspecified: Secondary | ICD-10-CM

## 2019-01-13 MED ORDER — ATORVASTATIN CALCIUM 80 MG PO TABS: 80.0000 mg | ORAL_TABLET | Freq: Every day | ORAL | 4 refills | Status: DC

## 2019-01-13 NOTE — Telephone Encounter (Signed)
[facility] faxed refill request for the following medications:  atorvastatin (LIPITOR) 80 MG tablet     Please advise.

## 2019-01-16 ENCOUNTER — Other Ambulatory Visit: Admission: RE | Admit: 2019-01-16 | Payer: PPO | Source: Ambulatory Visit

## 2019-01-20 ENCOUNTER — Ambulatory Visit: Payer: PPO | Admitting: Family Medicine

## 2019-01-20 ENCOUNTER — Ambulatory Visit: Admit: 2019-01-20 | Payer: PPO | Admitting: Unknown Physician Specialty

## 2019-01-20 DIAGNOSIS — M19031 Primary osteoarthritis, right wrist: Secondary | ICD-10-CM | POA: Diagnosis not present

## 2019-01-20 DIAGNOSIS — G5601 Carpal tunnel syndrome, right upper limb: Secondary | ICD-10-CM | POA: Insufficient documentation

## 2019-01-20 DIAGNOSIS — M65331 Trigger finger, right middle finger: Secondary | ICD-10-CM | POA: Diagnosis not present

## 2019-01-20 SURGERY — COLONOSCOPY WITH PROPOFOL
Anesthesia: General

## 2019-01-21 ENCOUNTER — Other Ambulatory Visit: Payer: Self-pay

## 2019-01-21 ENCOUNTER — Ambulatory Visit (INDEPENDENT_AMBULATORY_CARE_PROVIDER_SITE_OTHER): Payer: PPO | Admitting: Family Medicine

## 2019-01-21 ENCOUNTER — Encounter: Payer: Self-pay | Admitting: Family Medicine

## 2019-01-21 VITALS — BP 128/56 | HR 62 | Temp 97.1°F | Resp 16 | Wt 272.5 lb

## 2019-01-21 DIAGNOSIS — Z23 Encounter for immunization: Secondary | ICD-10-CM | POA: Diagnosis not present

## 2019-01-21 DIAGNOSIS — I10 Essential (primary) hypertension: Secondary | ICD-10-CM | POA: Diagnosis not present

## 2019-01-21 DIAGNOSIS — I429 Cardiomyopathy, unspecified: Secondary | ICD-10-CM | POA: Diagnosis not present

## 2019-01-21 DIAGNOSIS — I1A0 Resistant hypertension: Secondary | ICD-10-CM

## 2019-01-21 DIAGNOSIS — E1121 Type 2 diabetes mellitus with diabetic nephropathy: Secondary | ICD-10-CM | POA: Diagnosis not present

## 2019-01-21 LAB — POCT GLYCOSYLATED HEMOGLOBIN (HGB A1C): Hemoglobin A1C: 6.7 % — AB (ref 4.0–5.6)

## 2019-01-21 NOTE — Patient Instructions (Addendum)
.   Please review the attached list of medications and notify my office if there are any errors.   . Please bring all of your medications to every appointment so we can make sure that our medication list is the same as yours.   . You are due for a Td (tetanus-diptheria-vaccine) which protects you from tetanus and diptheria. Please check with your insurance plan or pharmacy regarding coverage for this vaccine.    You can use samples of 0.5mg  Ozempic, but you will have to take two shots each week instead of one

## 2019-01-21 NOTE — Progress Notes (Signed)
Patient: Jacob Herrera Male    DOB: December 22, 1950   68 y.o.   MRN: [id number] Visit Date: 01/21/2019  Today's Provider: Lelon Huh, MD   Chief Complaint  Patient presents with  . Hypertension  . Diabetes   Subjective:     HPI  Hypertension, follow-up:  BP Readings from Last 3 Encounters:  01/21/19 (!) 128/56  11/26/18 (!) 142/60  11/14/18 (!) 149/69    He was last seen for hypertension 3 months ago.  BP at that visit was 177/106. Management changes since that visit include increasing Hydralazine to 100mg  TID. He reports excellent compliance with treatment. He is not having side effects.  He is not exercising. He is not adherent to low salt diet.   Outside blood pressures are not being checked regularly. He is experiencing fatigue and lower extremity edema.  Patient denies chest pain, chest pressure/discomfort, claudication, dyspnea, exertional chest pressure/discomfort, irregular heart beat, near-syncope, orthopnea, palpitations, paroxysmal nocturnal dyspnea, syncope and tachypnea.   Cardiovascular risk factors include advanced age (older than 10 for men, 73 for women), diabetes mellitus, hypertension, male gender and obesity (BMI >= 30 kg/m2).  Use of agents associated with hypertension: NSAIDS.     Weight trend: increasing steadily Wt Readings from Last 3 Encounters:  01/21/19 272 lb 8 oz (123.6 kg)  11/26/18 276 lb (125.2 kg)  11/14/18 260 lb (117.9 kg)    Current diet: not asked  ------------------------------------------------------------------------  Diabetes Mellitus Type II, Follow-up:   Lab Results  Component Value Date   HGBA1C 6.5 (A) 10/04/2018   HGBA1C 6.8 (A) 07/02/2018   HGBA1C 6.0 (A) 03/26/2018    Last seen for diabetes 3 months ago.  Management since then includes none. He reports excellent compliance with treatment. He is not having side effects.  Current symptoms include paresthesia of the feet and visual disturbances and  have been unchanged. Home blood sugar records: fasting range: 150-170  Episodes of hypoglycemia? no   Current insulin regiment: Lantus 80 units and Novolog 16units before lunch Most Recent Eye Exam: 03/15/18 Weight trend: increasing Prior visit with dietician: No Current exercise: none Current diet habits: not asked  Pertinent Labs:    Component Value Date/Time   CHOL 155 10/07/2018 0825   TRIG 247 (H) 10/07/2018 0825   HDL 36 (L) 10/07/2018 0825   LDLCALC 70 10/07/2018 0825   LDLCALC 83 03/20/2017 1503   CREATININE 1.85 (H) 10/07/2018 0825   CREATININE 1.36 (H) 07/08/2013 0555    Wt Readings from Last 3 Encounters:  01/21/19 272 lb 8 oz (123.6 kg)  11/26/18 276 lb (125.2 kg)  11/14/18 260 lb (117.9 kg)    ------------------------------------------------------------------------  Allergies  Allergen Reactions  . Spironolactone     gynecomastia     Current Outpatient Medications:  .  albuterol (PROVENTIL) (2.5 MG/3ML) 0.083% nebulizer solution, Take 3 mLs (2.5 mg total) by nebulization every 4 (four) hours. And PRN, Disp: 75 mL, Rfl: 12 .  amLODipine (NORVASC) 10 MG tablet, Take 1 tablet (10 mg total) by mouth daily., Disp: 30 tablet, Rfl: 1 .  aspirin 81 MG tablet, Take 81 mg by mouth daily., Disp: , Rfl:  .  atorvastatin (LIPITOR) 80 MG tablet, Take 1 tablet (80 mg total) by mouth at bedtime., Disp: 90 tablet, Rfl: 4 .  BD PEN NEEDLE NANO U/F 32G X 4 MM MISC, USE DAILY AS DIRECTED, Disp: 100 each, Rfl: 3 .  budesonide-formoterol (SYMBICORT) 80-4.5 MCG/ACT inhaler, Inhale 2 puffs  into the lungs 2 (two) times daily., Disp: 3 Inhaler, Rfl: 2 .  carvedilol (COREG) 25 MG tablet, Take 1 tablet (25 mg total) by mouth 2 (two) times daily., Disp: 180 tablet, Rfl: 2 .  chlorthalidone (HYGROTON) 25 MG tablet, TAKE 1 TABLET BY MOUTH EVERY DAY, Disp: 90 tablet, Rfl: 4 .  cloNIDine (CATAPRES) 0.2 MG tablet, Take 1 tablet (0.2 mg total) by mouth 2 (two) times daily., Disp: 180  tablet, Rfl: 3 .  clopidogrel (PLAVIX) 75 MG tablet, Take 1 tablet (75 mg total) by mouth daily., Disp: 30 tablet, Rfl: 11 .  Docusate Calcium (STOOL SOFTENER PO), Take 100 mg by mouth as needed. , Disp: , Rfl:  .  doxazosin (CARDURA) 8 MG tablet, Take 1 tablet (8 mg total) by mouth daily., Disp: 90 tablet, Rfl: 3 .  finasteride (PROSCAR) 5 MG tablet, Take 1 tablet (5 mg total) by mouth daily., Disp: 90 tablet, Rfl: 2 .  fluticasone (FLONASE) 50 MCG/ACT nasal spray, Place 2 sprays into both nostrils daily., Disp: 16 g, Rfl: 2 .  furosemide (LASIX) 40 MG tablet, Take 40 mg by mouth 2 (two) times daily. , Disp: , Rfl:  .  glucose blood (ONETOUCH ULTRA) test strip, USE ONE STRIP THREE TIMES DAILY FOR INSULIN DEPENDENT DEPENDENT TYPE 2 DIABETES, Disp: 100 strip, Rfl: 4 .  hydrALAZINE (APRESOLINE) 100 MG tablet, Take 1 tablet (100 mg total) by mouth 3 (three) times daily., Disp: 270 tablet, Rfl: 3 .  insulin aspart (NOVOLOG FLEXPEN) 100 UNIT/ML FlexPen, Take 16 units before lunch., Disp: 15 mL, Rfl: 12 .  isosorbide mononitrate (IMDUR) 60 MG 24 hr tablet, Take 60 mg by mouth daily., Disp: , Rfl:  .  LANTUS SOLOSTAR 100 UNIT/ML Solostar Pen, TITRATE UP TO 80 UNITS, ONCE A DAY, Disp: 20 pen, Rfl: 3 .  levothyroxine (SYNTHROID, LEVOTHROID) 125 MCG tablet, TAKE 1 TABLET (125 MCG TOTAL) BY MOUTH DAILY., Disp: 90 tablet, Rfl: 4 .  losartan (COZAAR) 100 MG tablet, Take 1 tablet (100 mg total) by mouth daily., Disp: 30 tablet, Rfl: 1 .  Lysine 500 MG CAPS, Take 1 capsule by mouth daily., Disp: , Rfl:  .  Naproxen Sodium (ALEVE PO), Take by mouth., Disp: , Rfl:  .  oxyCODONE (OXY IR/ROXICODONE) 5 MG immediate release tablet, Take 1 tablet (5 mg total) by mouth daily as needed for severe pain., Disp: 30 tablet, Rfl: 0 .  Semaglutide, 1 MG/DOSE, (OZEMPIC, 1 MG/DOSE,) 2 MG/1.5ML SOPN, Inject 1 mg into the skin once a week., Disp: 1 pen, Rfl: 11  Review of Systems  Constitutional: Negative for appetite change,  chills and fever.  Respiratory: Negative for chest tightness, shortness of breath and wheezing.   Cardiovascular: Negative for chest pain and palpitations.  Gastrointestinal: Negative for abdominal pain, nausea and vomiting.    Social History   Tobacco Use  . Smoking status: Former Smoker    Packs/day: 1.00    Years: 25.00    Pack years: 25.00    Types: Cigarettes    Quit date: 05/29/1988    Years since quitting: 30.6  . Smokeless tobacco: Current User    Types: Chew  . Tobacco comment: chews 1 bag of loose leaf chew/week  Substance Use Topics  . Alcohol use: No    Alcohol/week: 0.0 standard drinks      Objective:   BP (!) 128/56   Pulse 62   Temp (!) 97.1 F (36.2 C) (Oral)   Resp 16   Wt  272 lb 8 oz (123.6 kg)   SpO2 95%   BMI 41.43 kg/m  Vitals:   01/21/19 1448  BP: (!) 128/56  Pulse: 62  Resp: 16  Temp: (!) 97.1 F (36.2 C)  TempSrc: Oral  SpO2: 95%  Weight: 272 lb 8 oz (123.6 kg)  Body mass index is 41.43 kg/m.   Physical Exam   General Appearance:    Severely obese male in no acute distress  Eyes:    PERRL, conjunctiva/corneas clear, EOM's intact       Lungs:     Clear to auscultation bilaterally, respirations unlabored  Heart:    Normal heart rate. Normal rhythm. No murmurs, rubs, or gallops.   MS:   All extremities are intact.   Neurologic:   Awake, alert, oriented x 3. No apparent focal neurological           defect.          Assessment & Plan     1. Resistant hypertension Much better with increased dose of hydralazine. Will work on weaning clonidine by reducing to 0.1mg  with next refill which is due 04-12-2019. He has follow up with nephrology prior to that time and will see how BP is at that visit before changing dose.   2. Diabetes mellitus with nephropathy ([place]) Last a1c was 6.7 on  01/21/2019   3. Need for influenza vaccination  - Flu Vaccine QUAD High Dose(Fluad)  4. Cardiomyopathy, unspecified type ([place]) Asymptomatic. Compliant  with medication.  Continue aggressive risk factor modification.     The entirety of the information documented in the History of Present Illness, Review of Systems and Physical Exam were personally obtained by me. Portions of this information were initially documented by Minette Headland, CMA and reviewed by me for thoroughness and accuracy.      Lelon Huh, MD  [facility]

## 2019-01-27 ENCOUNTER — Other Ambulatory Visit: Payer: Self-pay | Admitting: Family Medicine

## 2019-01-31 DIAGNOSIS — I5022 Chronic systolic (congestive) heart failure: Secondary | ICD-10-CM | POA: Diagnosis not present

## 2019-02-03 DIAGNOSIS — G5601 Carpal tunnel syndrome, right upper limb: Secondary | ICD-10-CM | POA: Diagnosis not present

## 2019-02-04 DIAGNOSIS — D0439 Carcinoma in situ of skin of other parts of face: Secondary | ICD-10-CM | POA: Diagnosis not present

## 2019-02-11 DIAGNOSIS — C44319 Basal cell carcinoma of skin of other parts of face: Secondary | ICD-10-CM | POA: Diagnosis not present

## 2019-02-14 ENCOUNTER — Telehealth: Payer: Self-pay | Admitting: Internal Medicine

## 2019-02-14 NOTE — Telephone Encounter (Signed)
Jacob Herrera w/[facility] is aware that pt will need an appt, as he is past due.  Pt was instructed to f/u around 07/2018 at last OV. Spoke to pt and scheduled an OV for 02/25/2019. Nothing further is needed.

## 2019-02-24 ENCOUNTER — Telehealth: Payer: Self-pay | Admitting: Internal Medicine

## 2019-02-24 NOTE — Telephone Encounter (Signed)
Pt stated that he was seen by a physician assistant a week ago, and the provider tested positive for covid that weekend. Due to this, pt will not be able to come in for face to face visit.  OV has been changed to phone visit.  Pt aware and voiced his understanding.  Nothing further is needed.

## 2019-02-25 ENCOUNTER — Other Ambulatory Visit: Payer: Self-pay

## 2019-02-25 ENCOUNTER — Encounter: Payer: Self-pay | Admitting: Internal Medicine

## 2019-02-25 ENCOUNTER — Ambulatory Visit (INDEPENDENT_AMBULATORY_CARE_PROVIDER_SITE_OTHER): Payer: PPO | Admitting: Internal Medicine

## 2019-02-25 DIAGNOSIS — G4733 Obstructive sleep apnea (adult) (pediatric): Secondary | ICD-10-CM

## 2019-02-25 DIAGNOSIS — R0609 Other forms of dyspnea: Secondary | ICD-10-CM

## 2019-02-25 DIAGNOSIS — R0600 Dyspnea, unspecified: Secondary | ICD-10-CM | POA: Diagnosis not present

## 2019-02-25 NOTE — Progress Notes (Signed)
I connected with the patient by telephone enabled telemedicine visit and verified that I am speaking with the correct person using two identifiers.    I discussed the limitations, risks, security and privacy concerns of performing an evaluation and management service by telemedicine and the availability of in-person appointments. I also discussed with the patient that there may be a patient responsible charge related to this service. The patient expressed understanding and agreed to proceed.  PATIENT AGREES AND CONFIRMS -YES   Other persons participating in the visit and their role in the encounter: Patient, nursing  This visit type was conducted due to national recommendations for restrictions regarding the COVID-19 Pandemic (e.g. social distancing).  This format is felt to be most appropriate for this patient at this time.  All issues noted in this document were discussed and addressed.        Subjective:    Patient ID: Jacob Herrera, male    DOB: 07-05-1950, 68 y.o.   MRN: [id number]  Synopsis: Mr. Windhorst first saw the [facility] pulmonary clinic in February 2015 for evaluation of shortness of breath. He had a past medical history significant for asthma. Pulmonary function testing was normal with the exception of restriction secondary to obesity. He was started on a combination long acting bronchodilator/inhaled corticosteroid inhaler and he noted significant benefit from this.  Compliance reports as listed below 10/2015  100% compliance Leak 117.4L/min AHI 12.9  06/2016 compliance report 100% compliance Leak 118 L/min AHI 9.1  11/18 100% compliance Leak 24 L/min AHI14  6.18.19 AHI 30 100% compliance No leak IPAP 25 EPAP 5  10/19 AHI 10 100% compliance BiPAP IPAP 25 EPAP 12  02/25/2019 AHI of 10 100% compliance BiPAP IPAP 25 EPAP 12 Minimal leak  CC:  Follow-up OSA    HPI   History of OSA Non-smoker History of CHF Patient is compliant with OSA  therapy  No  exacerbation at this time No evidence of heart failure at this time No evidence or signs of infection at this time No respiratory distress No fevers, chills, nausea, vomiting, diarrhea No evidence of lower extremity edema No evidence hemoptysis  Oxygen is needed with biPAP Patient needs oxygen to survive   Review of Systems  Constitutional: Negative for chills, fatigue and fever.  HENT: Negative for postnasal drip.   Respiratory: Negative for chest tightness, shortness of breath and wheezing.   Cardiovascular: Positive for leg swelling. Negative for chest pain and palpitations.  Musculoskeletal: Positive for back pain and joint swelling.  All other systems reviewed and are negative.      05/2013 Full FPT > ratio 82%, FEV1 1.95L (67% pred), no change with BD, TLC 3.91 L (64% pred), ERV 0.26L (18% pred), DLCO 15.6 (54% pred)     Assessment & Plan:   68 year old morbidly obese white male with severe sleep apnea with a history of cardiomyopathy status post stent with underlying reactive airways disease with chronic kidney disease and chronic hypoxic respiratory failure  Overall patient has a very poor prognosis due to his morbid obesity and comorbid conditions with underlying multiorgan dysfunction  Chronic shortness of breath and dyspnea exertion multifactorial related to morbid obesity underlying sleep apnea and reactive airways disease  Reactive airways disease due to his morbid obesity Responds well to inhaler therapy No indication for prednisone at this time  Cardiomyopathy and hypertension Both of these diseases are related to his underlying sleep apnea and therefore therapy for sleep apnea is essential for treating these diseases  CKD Follow-up with nephrology  Sleep apnea severe AHI of 30 Previous compliance reports have been excellent with control of sleep apnea Patient is on BiPAP therapy Patient wears full facemask  Obesity -recommend  significant weight loss -recommend changing diet  Deconditioned state -Recommend increased daily activity and exercise   Chronic hypoxic resp failure with OSA/OHS on BIPAP Patient uses and benefits from oxygen therapy Patient needs this for survival  COVID-19 EDUCATION: The signs and symptoms of COVID-19 were discussed with the patient and how to seek care for testing.  The importance of social distancing was discussed today. Hand Washing Techniques and avoid touching face was advised.     MEDICATION ADJUSTMENTS/LABS AND TESTS ORDERED: Continue inhaler therapy as prescribed [place] BiPAP as prescribed Patient needs oxygen for survival  CURRENT MEDICATIONS REVIEWED AT LENGTH WITH PATIENT TODAY   Patient satisfied with Plan of action and management. All questions answered  Follow up in 1 year   Akeya Ryther Patricia Pesa, M.D.  [facility]  Medical Director [place] Director [facility] Cardio-Pulmonary Department

## 2019-02-25 NOTE — Patient Instructions (Addendum)
Continue BiPAP as prescribed Follow-up with cardiology as scheduled Patient needs oxygen therapy

## 2019-03-03 DIAGNOSIS — I5022 Chronic systolic (congestive) heart failure: Secondary | ICD-10-CM | POA: Diagnosis not present

## 2019-03-11 ENCOUNTER — Other Ambulatory Visit: Payer: Self-pay

## 2019-03-11 DIAGNOSIS — E1121 Type 2 diabetes mellitus with diabetic nephropathy: Secondary | ICD-10-CM

## 2019-03-11 MED ORDER — BD PEN NEEDLE NANO U/F 32G X 4 MM MISC: 3 refills | Status: DC

## 2019-03-24 ENCOUNTER — Other Ambulatory Visit: Payer: Self-pay | Admitting: Family Medicine

## 2019-03-24 DIAGNOSIS — I10 Essential (primary) hypertension: Secondary | ICD-10-CM

## 2019-03-27 ENCOUNTER — Telehealth: Payer: Self-pay | Admitting: Family Medicine

## 2019-03-27 DIAGNOSIS — H35373 Puckering of macula, bilateral: Secondary | ICD-10-CM | POA: Diagnosis not present

## 2019-03-27 DIAGNOSIS — E1121 Type 2 diabetes mellitus with diabetic nephropathy: Secondary | ICD-10-CM

## 2019-03-27 DIAGNOSIS — E119 Type 2 diabetes mellitus without complications: Secondary | ICD-10-CM | POA: Diagnosis not present

## 2019-03-27 LAB — HM DIABETES EYE EXAM

## 2019-03-27 NOTE — Telephone Encounter (Signed)
Sending a letter back from [facility] regarding Lantus RX re-order request

## 2019-04-01 ENCOUNTER — Other Ambulatory Visit: Payer: Self-pay

## 2019-04-01 ENCOUNTER — Telehealth: Payer: Self-pay | Admitting: Family Medicine

## 2019-04-01 DIAGNOSIS — Z20822 Contact with and (suspected) exposure to covid-19: Secondary | ICD-10-CM

## 2019-04-01 NOTE — Telephone Encounter (Signed)
We were planing on cutting back dose of clonidine if his BP was better at his nephrology appointment, but I haven't gotten a report from his nephrologist Dr. Juleen China. Please check and see if had an appointment and have records faxed to [place]. Thanks.

## 2019-04-02 NOTE — Telephone Encounter (Signed)
Spoke with Desiree at dr. Assunta Gambles office. She states patient's LOV was August. He is scheduled for a follow up on 04/23/2019. She will fax OV notes after that appointment.

## 2019-04-03 LAB — NOVEL CORONAVIRUS, NAA: SARS-CoV-2, NAA: NOT DETECTED

## 2019-04-04 ENCOUNTER — Ambulatory Visit (INDEPENDENT_AMBULATORY_CARE_PROVIDER_SITE_OTHER): Payer: PPO | Admitting: Physician Assistant

## 2019-04-04 ENCOUNTER — Encounter: Payer: Self-pay | Admitting: Physician Assistant

## 2019-04-04 DIAGNOSIS — J029 Acute pharyngitis, unspecified: Secondary | ICD-10-CM

## 2019-04-04 MED ORDER — FIRST-DUKES MOUTHWASH MT SUSP: 5.0000 mL | Freq: Three times a day (TID) | OROMUCOSAL | 0 refills | Status: DC

## 2019-04-04 NOTE — Progress Notes (Signed)
Patient: Jacob Herrera Male    DOB: Jun 12, 1950   68 y.o.   MRN: [id number] Visit Date: 04/04/2019  Today's Provider: Trinna Post, PA-C   Chief Complaint  Patient presents with  . Sore Throat   Subjective:    Virtual Visit via Telephone Note  I connected with Jacob Herrera on 04/04/19 at  2:40 PM EST by telephone and verified that I am speaking with the correct person using two identifiers.  Location: Patient: Home Provider: Office   I discussed the limitations, risks, security and privacy concerns of performing an evaluation and management service by telephone and the availability of in person appointments. I also discussed with the patient that there may be a patient responsible charge related to this service. The patient expressed understanding and agreed to proceed.   HPI  Patient reports sore throat x 1 week. Covid test 04/01/2019 was negative. Patient reports he is a mouth breather. Denies fever/chills. Reports pain is a 9/10. Reports his temperature was normal recently. Denies seasonal allergies. Reports he has a history of sinus surgery in 2005 for recurrent sinusitis. He has already been on amoxicillin for tooth infection and he was started on this on 03/25/2019 and finished on 04/01/2019.  Allergies  Allergen Reactions  . Spironolactone     gynecomastia     Current Outpatient Medications:  .  albuterol (PROVENTIL) (2.5 MG/3ML) 0.083% nebulizer solution, Take 3 mLs (2.5 mg total) by nebulization every 4 (four) hours. And PRN, Disp: 75 mL, Rfl: 12 .  amLODipine (NORVASC) 10 MG tablet, Take 1 tablet (10 mg total) by mouth daily., Disp: 30 tablet, Rfl: 1 .  aspirin 81 MG tablet, Take 81 mg by mouth daily., Disp: , Rfl:  .  atorvastatin (LIPITOR) 80 MG tablet, Take 1 tablet (80 mg total) by mouth at bedtime., Disp: 90 tablet, Rfl: 4 .  budesonide-formoterol (SYMBICORT) 80-4.5 MCG/ACT inhaler, Inhale 2 puffs into the lungs 2 (two) times daily., Disp: 3  Inhaler, Rfl: 2 .  carvedilol (COREG) 25 MG tablet, TAKE ONE TABLET BY MOUTH TWICE DAILY, Disp: 180 tablet, Rfl: 1 .  chlorthalidone (HYGROTON) 25 MG tablet, TAKE 1 TABLET BY MOUTH EVERY DAY, Disp: 90 tablet, Rfl: 4 .  cloNIDine (CATAPRES) 0.2 MG tablet, Take 1 tablet (0.2 mg total) by mouth 2 (two) times daily., Disp: 180 tablet, Rfl: 3 .  clopidogrel (PLAVIX) 75 MG tablet, Take 1 tablet (75 mg total) by mouth daily., Disp: 30 tablet, Rfl: 11 .  doxazosin (CARDURA) 8 MG tablet, Take 1 tablet (8 mg total) by mouth daily., Disp: 90 tablet, Rfl: 3 .  finasteride (PROSCAR) 5 MG tablet, Take 1 tablet (5 mg total) by mouth daily., Disp: 90 tablet, Rfl: 2 .  fluticasone (FLONASE) 50 MCG/ACT nasal spray, Place 2 sprays into both nostrils daily., Disp: 16 g, Rfl: 2 .  furosemide (LASIX) 40 MG tablet, Take 40 mg by mouth 2 (two) times daily. , Disp: , Rfl:  .  glucose blood (ONETOUCH ULTRA) test strip, USE ONE STRIP THREE TIMES DAILY FOR INSULIN DEPENDENT DEPENDENT TYPE 2 DIABETES, Disp: 100 strip, Rfl: 4 .  hydrALAZINE (APRESOLINE) 100 MG tablet, Take 1 tablet (100 mg total) by mouth 3 (three) times daily., Disp: 270 tablet, Rfl: 3 .  insulin aspart (NOVOLOG FLEXPEN) 100 UNIT/ML FlexPen, Take 16 units before lunch., Disp: 15 mL, Rfl: 12 .  Insulin Pen Needle (BD PEN NEEDLE NANO U/F) 32G X 4 MM MISC, USE DAILY  AS DIRECTED, Disp: 100 each, Rfl: 3 .  isosorbide mononitrate (IMDUR) 60 MG 24 hr tablet, Take 60 mg by mouth daily., Disp: , Rfl:  .  LANTUS SOLOSTAR 100 UNIT/ML Solostar Pen, TITRATE UP TO 80 UNITS, ONCE A DAY, Disp: 20 pen, Rfl: 3 .  levothyroxine (SYNTHROID) 125 MCG tablet, TAKE ONE TABLET BY MOUTH EVERY DAY, Disp: 90 tablet, Rfl: 4 .  losartan (COZAAR) 100 MG tablet, Take 1 tablet (100 mg total) by mouth daily., Disp: 30 tablet, Rfl: 1 .  Lysine 500 MG CAPS, Take 1 capsule by mouth daily., Disp: , Rfl:  .  Naproxen Sodium (ALEVE PO), Take by mouth., Disp: , Rfl:  .  Semaglutide, 1 MG/DOSE,  (OZEMPIC, 1 MG/DOSE,) 2 MG/1.5ML SOPN, Inject 1 mg into the skin once a week., Disp: 1 pen, Rfl: 11 .  Docusate Calcium (STOOL SOFTENER PO), Take 100 mg by mouth as needed. , Disp: , Rfl:  .  oxyCODONE (OXY IR/ROXICODONE) 5 MG immediate release tablet, Take 1 tablet (5 mg total) by mouth daily as needed for severe pain. (Patient not taking: Reported on 04/04/2019), Disp: 30 tablet, Rfl: 0  Review of Systems  Constitutional: Negative for appetite change, chills and fever.  HENT: Positive for sore throat.   Respiratory: Negative for chest tightness, shortness of breath and wheezing.   Cardiovascular: Negative for chest pain and palpitations.  Gastrointestinal: Negative for abdominal pain, nausea and vomiting.    Social History   Tobacco Use  . Smoking status: Former Smoker    Packs/day: 1.00    Years: 25.00    Pack years: 25.00    Types: Cigarettes    Quit date: 05/29/1988    Years since quitting: 30.8  . Smokeless tobacco: Current User    Types: Chew  . Tobacco comment: chews 1 bag of loose leaf chew/week  Substance Use Topics  . Alcohol use: No    Alcohol/week: 0.0 standard drinks      Objective:   There were no vitals taken for this visit. There were no vitals filed for this visit.There is no height or weight on file to calculate BMI.   Physical Exam   No results found for any visits on 04/04/19.     Assessment & Plan    1. Sore throat  Will send in [employer] Magic Mouthwash for pain relief. Patient has been on antibiotics that would cover for bacterial sore throat for multiple weeks.    I discussed the assessment and treatment plan with the patient. The patient was provided an opportunity to ask questions and all were answered. The patient agreed with the plan and demonstrated an understanding of the instructions.   The patient was advised to call back or seek an in-person evaluation if the symptoms worsen or if the condition fails to improve as anticipated.  I  provided 15 minutes of non-face-to-face time during this encounter.  The entirety of the information documented in the History of Present Illness, Review of Systems and Physical Exam were personally obtained by me. Portions of this information were initially documented by April M. Sabra Heck, CMA and reviewed by me for thoroughness and accuracy.      Trinna Post, PA-C  [facility]

## 2019-04-04 NOTE — Telephone Encounter (Addendum)
Letter states that program expires December 31st and he he needs to re-apply  Almyra Free, do you know how to go about re-applying for 2021?

## 2019-04-04 NOTE — Telephone Encounter (Signed)
From [facility] 

## 2019-04-04 NOTE — Telephone Encounter (Signed)
Pt calling again to make sure he gets a call back about this letter.  He is getting a call from his drug supplier to refill his lantus and cholesterol meds.  Please call [phone]

## 2019-04-04 NOTE — Telephone Encounter (Signed)
Pt called to check status of this request, please advise

## 2019-04-10 ENCOUNTER — Ambulatory Visit (INDEPENDENT_AMBULATORY_CARE_PROVIDER_SITE_OTHER): Payer: PPO | Admitting: Pharmacist

## 2019-04-10 DIAGNOSIS — E1121 Type 2 diabetes mellitus with diabetic nephropathy: Secondary | ICD-10-CM

## 2019-04-10 NOTE — Patient Instructions (Signed)
Goals Addressed            This Visit's Progress   . Medication assistance plan 2021 (pt-stated)       Current Barriers:  . financial  Pharmacist Clinical Goal(s): Over the next 14 days, Mr.. Aguinaldo will provide the necessary supplementary documents (proof of out of pocket prescription expenditure, proof of household income) needed for medication assistance applications to CCM pharmacist.   Interventions: . CCM pharmacist will apply for medication assistance program for Ozempic, Novolog made by [employer] and prescribed by Dr. Caryn Section . In February 2021, CCM pharmacist and patient will apply for medication assistance program for Lantus made by [employer] and prescribed by Dr. Caryn Section   Patient Self Care Activities:  [name] Kitchen Gather necessary documents needed to apply for medication assistance  Initial goal documentation

## 2019-04-10 NOTE — Chronic Care Management (AMB) (Signed)
  Chronic Care Management   Note  04/10/2019 Name: Jacob Herrera MRN: [id number] DOB: [birth year]  Jacob Herrera is a 68 y.o. year old male who sees Jacob Herrera, Jacob Peri, MD for primary care. Dr. Caryn Herrera forwarded CCM clinical pharmacist telephone call from patient regarding medication assistance. Successful telephone outreach today, HIPAA identifiers verified.  Jacob Herrera agreed to services and verbal consent obtained.   Assessment:   Goals Addressed            This Visit's Progress   . Medication assistance plan 2021 (pt-stated)       Current Barriers:  . financial  Pharmacist Clinical Goal(s): Over the next 14 days, Jacob Herrera will provide the necessary supplementary documents (proof of out of pocket prescription expenditure, proof of household income) needed for medication assistance applications to CCM pharmacist.   Interventions: . CCM pharmacist will apply for medication assistance program for Ozempic, Novolog made by [employer] and prescribed by Dr. Caryn Herrera . In February 2021, CCM pharmacist and patient will apply for medication assistance program for Lantus made by [employer] and prescribed by Dr. Caryn Herrera   Patient Self Care Activities:  [name] Kitchen Gather necessary documents needed to apply for medication assistance  Initial goal documentation         Plan:   CCM Pharmacist will apply right now for medication assistance for Ozempic and Novolog ([employer]ovo[employer]) and tentatively plan to apply for Lantus assistance in February to better assess patient's eligibility for program (2% OOP minimum)    Jacob Herrera was given information about Chronic Care Management services today including:  1. CCM service includes personalized support from designated clinical staff supervised by her physician, including individualized plan of care and coordination with other care providers 2. 24/7 contact phone numbers for assistance for urgent and routine care needs. 3. Service will only be billed  when office clinical staff spend 20 minutes or more in a month to coordinate care. 4. Only one practitioner may furnish and bill the service in a calendar month. 5. The patient may stop CCM services at any time (effective at the end of the month) by phone call to the office staff. 6. The patient will be responsible for cost sharing (co-pay) of up to 20% of the service fee (after annual deductible is met).  Patient agreed to services and verbal consent obtained.      Follow up plan: Face to Face appointment with care management team member scheduled for:  May 26 2019  Jacob Herrera, [place] [phone]

## 2019-04-10 NOTE — Telephone Encounter (Addendum)
Yes, we will have to reapply for 2021 medication assistance for Lantus. The parameters of the program state that a patient must spend 2% of annual income on prescription costs prior to enrollment, so I will plan to contact him in February so that we can get a better estimate on eligibility.   I will call patient to discuss 2021 plan for assistance with him so he doesn't have lingering questions.    Ruben Reason, PharmD Clinical Pharmacist [place] [phone]

## 2019-04-14 ENCOUNTER — Other Ambulatory Visit: Payer: Self-pay | Admitting: Family Medicine

## 2019-04-14 NOTE — Telephone Encounter (Signed)
Requested medication (s) are due for refill today: yes Requested medication (s) are on the active medication list: yes  Last refill: 02/10/19  Future visit scheduled: yes  Notes to clinic: medication last filled by historical provider    Requested Prescriptions  Pending Prescriptions Disp Refills   isosorbide mononitrate (IMDUR) 60 MG 24 hr tablet [Pharmacy Med Name: ISOSORBIDE MONONITRATE ER 60 MG TAB] 30 tablet     Sig: TAKE ONE TABLET BY MOUTH ONCE A DAY      Cardiovascular:  Nitrates Passed - 04/14/2019  3:26 PM      Passed - Last BP in normal range    BP Readings from Last 1 Encounters:  01/21/19 (!) 128/56          Passed - Last Heart Rate in normal range    Pulse Readings from Last 1 Encounters:  01/21/19 62          Passed - Valid encounter within last 12 months    Recent Outpatient Visits           1 week ago Sore throat   [place], [place], [place]   2 months ago Resistant hypertension   [facility] [name], MD   4 months ago Carpal tunnel syndrome on right   [facility] [name], MD   6 months ago Diabetes mellitus with nephropathy [facility])   [facility] [name], MD   9 months ago Diabetes mellitus with nephropathy [facility])   [place], [name], MD       Future Appointments             In 1 month [name], [name], MD [facility], [place]   In 7 months [place], Ronda Fairly, MD [place]

## 2019-04-16 DIAGNOSIS — N2581 Secondary hyperparathyroidism of renal origin: Secondary | ICD-10-CM | POA: Diagnosis not present

## 2019-04-16 DIAGNOSIS — E1122 Type 2 diabetes mellitus with diabetic chronic kidney disease: Secondary | ICD-10-CM | POA: Diagnosis not present

## 2019-04-16 DIAGNOSIS — N183 Chronic kidney disease, stage 3 unspecified: Secondary | ICD-10-CM | POA: Diagnosis not present

## 2019-04-16 DIAGNOSIS — I10 Essential (primary) hypertension: Secondary | ICD-10-CM | POA: Diagnosis not present

## 2019-04-16 DIAGNOSIS — N041 Nephrotic syndrome with focal and segmental glomerular lesions: Secondary | ICD-10-CM | POA: Diagnosis not present

## 2019-04-16 DIAGNOSIS — R809 Proteinuria, unspecified: Secondary | ICD-10-CM | POA: Diagnosis not present

## 2019-04-23 DIAGNOSIS — I129 Hypertensive chronic kidney disease with stage 1 through stage 4 chronic kidney disease, or unspecified chronic kidney disease: Secondary | ICD-10-CM | POA: Diagnosis not present

## 2019-04-23 DIAGNOSIS — R809 Proteinuria, unspecified: Secondary | ICD-10-CM | POA: Insufficient documentation

## 2019-04-23 DIAGNOSIS — N041 Nephrotic syndrome with focal and segmental glomerular lesions: Secondary | ICD-10-CM | POA: Insufficient documentation

## 2019-04-23 DIAGNOSIS — N2581 Secondary hyperparathyroidism of renal origin: Secondary | ICD-10-CM | POA: Insufficient documentation

## 2019-04-23 DIAGNOSIS — N1832 Chronic kidney disease, stage 3b: Secondary | ICD-10-CM | POA: Diagnosis not present

## 2019-04-24 ENCOUNTER — Telehealth: Payer: Self-pay

## 2019-04-28 ENCOUNTER — Ambulatory Visit: Payer: Self-pay | Admitting: Pharmacist

## 2019-04-28 DIAGNOSIS — E1121 Type 2 diabetes mellitus with diabetic nephropathy: Secondary | ICD-10-CM

## 2019-04-28 NOTE — Chronic Care Management (AMB) (Signed)
Chronic Care Management   Follow Up Note   04/28/2019 Name: Jacob Herrera MRN: [id number] DOB: 09/08/1950  Subjective Jacob Herrera is a 69 y.o. year old male who is a primary care patient of [name], [name], MD. The CCM clinical pharmacist was consulted for assistance with chronic disease management and care coordination needs.  Successful telephone outreach today regarding medication assistance applications. HIPAA identifiers verified.  Review of patient status, including review of consultants reports, relevant laboratory and other test results, and collaboration with appropriate care team members and the patient's provider was performed as part of comprehensive patient evaluation and provision of chronic care management services.    Objective  Outpatient Encounter Medications as of 04/28/2019  Medication Sig  . albuterol (PROVENTIL) (2.5 MG/3ML) 0.083% nebulizer solution Take 3 mLs (2.5 mg total) by nebulization every 4 (four) hours. And PRN  . amLODipine (NORVASC) 10 MG tablet Take 1 tablet (10 mg total) by mouth daily.  [name] Kitchen aspirin 81 MG tablet Take 81 mg by mouth daily.  [name] Kitchen atorvastatin (LIPITOR) 80 MG tablet Take 1 tablet (80 mg total) by mouth at bedtime.  . budesonide-formoterol (SYMBICORT) 80-4.5 MCG/ACT inhaler Inhale 2 puffs into the lungs 2 (two) times daily.  . carvedilol (COREG) 25 MG tablet TAKE ONE TABLET BY MOUTH TWICE DAILY  . chlorthalidone (HYGROTON) 25 MG tablet TAKE 1 TABLET BY MOUTH EVERY DAY  . cloNIDine (CATAPRES) 0.2 MG tablet Take 1 tablet (0.2 mg total) by mouth 2 (two) times daily.  . clopidogrel (PLAVIX) 75 MG tablet Take 1 tablet (75 mg total) by mouth daily.  . Diphenhyd-Hydrocort-Nystatin (FIRST-DUKES MOUTHWASH) SUSP Swish and spit 5 mLs 3 (three) times daily.  Mariane Baumgarten Calcium (STOOL SOFTENER PO) Take 100 mg by mouth as needed.   . doxazosin (CARDURA) 8 MG tablet Take 1 tablet (8 mg total) by mouth daily.  . finasteride (PROSCAR) 5 MG tablet Take 1  tablet (5 mg total) by mouth daily.  . fluticasone (FLONASE) 50 MCG/ACT nasal spray Place 2 sprays into both nostrils daily.  . furosemide (LASIX) 40 MG tablet Take 40 mg by mouth 2 (two) times daily.   [name] Kitchen glucose blood (ONETOUCH ULTRA) test strip USE ONE STRIP THREE TIMES DAILY FOR INSULIN DEPENDENT DEPENDENT TYPE 2 DIABETES  . hydrALAZINE (APRESOLINE) 100 MG tablet Take 1 tablet (100 mg total) by mouth 3 (three) times daily.  . insulin aspart (NOVOLOG FLEXPEN) 100 UNIT/ML FlexPen Take 16 units before lunch.  . Insulin Pen Needle (BD PEN NEEDLE NANO U/F) 32G X 4 MM MISC USE DAILY AS DIRECTED  . isosorbide mononitrate (IMDUR) 60 MG 24 hr tablet Take 1 tablet (60 mg total) by mouth daily.  [name] Kitchen LANTUS SOLOSTAR 100 UNIT/ML Solostar Pen TITRATE UP TO 80 UNITS, ONCE A DAY  . levothyroxine (SYNTHROID) 125 MCG tablet TAKE ONE TABLET BY MOUTH EVERY DAY  . losartan (COZAAR) 100 MG tablet Take 1 tablet (100 mg total) by mouth daily.  [name] Kitchen Lysine 500 MG CAPS Take 1 capsule by mouth daily.  . Naproxen Sodium (ALEVE PO) Take by mouth.  . oxyCODONE (OXY IR/ROXICODONE) 5 MG immediate release tablet Take 1 tablet (5 mg total) by mouth daily as needed for severe pain. (Patient not taking: Reported on 04/04/2019)  . Semaglutide, 1 MG/DOSE, (OZEMPIC, 1 MG/DOSE,) 2 MG/1.5ML SOPN Inject 1 mg into the skin once a week.   No facility-administered encounter medications on file as of 04/28/2019.    Assessment Goals Addressed  This Visit's Progress   . Medication assistance plan 2021 (pt-stated)       Current Barriers:  . financial  Pharmacist Clinical Goal(s): Over the next 14 days, Mr.. Eslick will provide the necessary supplementary documents (proof of out of pocket prescription expenditure, proof of household income) needed for medication assistance applications to CCM pharmacist.   Interventions: . CCM pharmacist will apply for medication assistance program for Ozempic, Novolog made by [employer] and  prescribed by Dr. Caryn Section o Updated 1/4: reviewed accepted financial documents, patient has not received 2020 annual 1099 form . In February 2021, CCM pharmacist and patient will apply for medication assistance program for Lantus made by [employer] and prescribed by Dr. Caryn Section   Patient Self Care Activities:  [name] Kitchen Gather necessary documents needed to apply for medication assistance  Please see past updates related to this goal by clicking on the "Past Updates" button in the selected goal          Plan: Recommendations for provider: N/A   Recommendations for patient: please bring your 2021 projected monthly earning statement from [facility] so that Almyra Free or Arbie Cookey can make a copy to include with your application  Follow up Care coordination - [employer] application- follow up in 1 week  Ruben Reason, PharmD Clinical Pharmacist [place] [phone]

## 2019-04-29 ENCOUNTER — Other Ambulatory Visit: Payer: Self-pay | Admitting: Family Medicine

## 2019-04-29 DIAGNOSIS — E1121 Type 2 diabetes mellitus with diabetic nephropathy: Secondary | ICD-10-CM

## 2019-04-30 NOTE — Telephone Encounter (Signed)
Requested Prescriptions  Pending Prescriptions Disp Refills  . OZEMPIC, 1 MG/DOSE, 2 MG/1.5ML SOPN [Pharmacy Med Name: OZEMPIC (1 MG/DOSE) 2 MG/1.5ML SUBQ] 3 mL 0    Sig: INJECT 1MG  INTO THE SKIN ONCE A WEEK     Endocrinology:  Diabetes - GLP-1 Receptor Agonists Passed - 04/29/2019  6:39 PM      Passed - HBA1C is between 0 and 7.9 and within 180 days    Hemoglobin A1C  Date Value Ref Range Status  01/21/2019 6.7 (A) 4.0 - 5.6 % Final   Hgb A1c MFr Bld  Date Value Ref Range Status  03/20/2017 6.3 (H) <5.7 % of total Hgb Final    Comment:    For someone without known diabetes, a hemoglobin  A1c value between 5.7% and 6.4% is consistent with prediabetes and should be confirmed with a  follow-up test. . For someone with known diabetes, a value <7% indicates that their diabetes is well controlled. A1c targets should be individualized based on duration of diabetes, age, comorbid conditions, and other considerations. . This assay result is consistent with an increased risk of diabetes. . Currently, no consensus exists regarding use of hemoglobin A1c for diagnosis of diabetes for children. Renella Cunas - Valid encounter within last 6 months    Recent Outpatient Visits          3 weeks ago Sore throat   [place], [place], [place]   3 months ago Resistant hypertension   [facility] [name], MD   5 months ago Carpal tunnel syndrome on right   [facility] [name], MD   6 months ago Diabetes mellitus with nephropathy [facility])   [facility] [name], MD   10 months ago Diabetes mellitus with nephropathy [facility])   [facility] [name], MD      Future Appointments            In 3 weeks [name], [name], MD [facility], [place]   In 6 months [place], Ronda Fairly, MD [place]

## 2019-05-09 ENCOUNTER — Ambulatory Visit (INDEPENDENT_AMBULATORY_CARE_PROVIDER_SITE_OTHER): Payer: PPO | Admitting: Pharmacist

## 2019-05-09 DIAGNOSIS — E1121 Type 2 diabetes mellitus with diabetic nephropathy: Secondary | ICD-10-CM | POA: Diagnosis not present

## 2019-05-09 NOTE — Patient Instructions (Signed)
Goals Addressed            This Visit's Progress   . Medication assistance plan 2021 (pt-stated)       Current Barriers:  . financial  Pharmacist Clinical Goal(s): Over the next 14 days, Jacob Herrera will provide the necessary supplementary documents (proof of out of pocket prescription expenditure, proof of household income) needed for medication assistance applications to CCM pharmacist.   Interventions: . CCM pharmacist will apply for medication assistance program for Ozempic, Novolog made by [employer] and prescribed by Dr. Caryn Section o Updated 1/4: reviewed accepted financial documents, patient has not received 2020 annual 1099 form . In February 2021, CCM pharmacist and patient will apply for medication assistance program for Lantus made by [employer] and prescribed by Dr. Caryn Section  . Updated 1/15: received completed application for Ozempic, Novolog and submitted to [employer]; uploaded copy under media tab  Patient Self Care Activities:  [name] Kitchen Gather necessary documents needed to apply for medication assistance  Please see past updates related to this goal by clicking on the "Past Updates" button in the selected goal

## 2019-05-09 NOTE — Chronic Care Management (AMB) (Signed)
  Chronic Care Management   Care Coordination Note  05/09/2019 Name: Jacob Herrera MRN: [id number] DOB: Jan 10, 1951  Care coordination: [employer] application for Novolog and Ozempic completed; faxed to [employer] and uploaded copy under media tab.   Goals Addressed            This Visit's Progress   . Medication assistance plan 2021 (pt-stated)       Current Barriers:  . financial  Pharmacist Clinical Goal(s): Over the next 14 days, Mr.. Lupton will provide the necessary supplementary documents (proof of out of pocket prescription expenditure, proof of household income) needed for medication assistance applications to CCM pharmacist.   Interventions: . CCM pharmacist will apply for medication assistance program for Ozempic, Novolog made by [employer] and prescribed by Dr. Caryn Section o Updated 1/4: reviewed accepted financial documents, patient has not received 2020 annual 1099 form . In February 2021, CCM pharmacist and patient will apply for medication assistance program for Lantus made by [employer] and prescribed by Dr. Caryn Section  . Updated 1/15: received completed application for Ozempic, Novolog and submitted to [employer]; uploaded copy under media tab  Patient Self Care Activities:  [name] Kitchen Gather necessary documents needed to apply for medication assistance  Please see past updates related to this goal by clicking on the "Past Updates" button in the selected goal          Follow up plan: Care Coordination follow up appointment with care management team member scheduled for: 5-7 days to follow up on approval  Ruben Reason, PharmD Clinical Pharmacist [place] [phone]

## 2019-05-12 DIAGNOSIS — C44311 Basal cell carcinoma of skin of nose: Secondary | ICD-10-CM | POA: Diagnosis not present

## 2019-05-12 HISTORY — PX: MOHS SURGERY: SUR867

## 2019-05-15 ENCOUNTER — Ambulatory Visit: Payer: Self-pay | Admitting: Pharmacist

## 2019-05-15 DIAGNOSIS — E1121 Type 2 diabetes mellitus with diabetic nephropathy: Secondary | ICD-10-CM

## 2019-05-19 NOTE — Chronic Care Management (AMB) (Signed)
  Chronic Care Management   Care Coordination Note  05/19/2019 Name: Aaran Enberg MRN: [id number] DOB: [birth year]  Care Coordination: contacted [employer] regarding patient's Ozempic and Novolog application. Representative states that patient's financial income paperwork "was too dark" to read on fax and needs to be refaxed. Additionally, because the signatures were dated from 2020, even though the application was submitted in 2021, they are unacceptable. Even though the dates are after open enrollment and patient would know is health insurance status. This is incredibly frustrating in light of the COVID 19 pandemic and effort to keep patients safe at home instead of coming to the office. I will contact Mr. Schewe to inform him of these updates.  Goals Addressed            This Visit's Progress   . Medication assistance plan 2021 (pt-stated)       Current Barriers:  . financial  Pharmacist Clinical Goal(s): Over the next 14 days, Mr.. [place]burn will provide the necessary supplementary documents (proof of out of pocket prescription expenditure, proof of household income) needed for medication assistance applications to CCM pharmacist.   Interventions: . CCM pharmacist will apply for medication assistance program for Ozempic, Novolog made by [employer] and prescribed by Dr. Caryn Section o Updated 1/4: reviewed accepted financial documents, patient has not received 2020 annual 1099 form o Updated 1/22: apparently financial records were difficult to read and need to be re-faxed . In February 2021, CCM pharmacist and patient will apply for medication assistance program for Lantus made by [employer] and prescribed by Dr. Caryn Section  . Updated 1/15: received completed application for Ozempic, Novolog and submitted to [employer]; uploaded copy under media tab  Patient Self Care Activities:  [name] Kitchen Gather necessary documents needed to apply for medication assistance  Please see past updates related to this goal by  clicking on the "Past Updates" button in the selected goal          Follow up plan: Telephone follow up appointment with care management team member scheduled for: Jan 25   Ruben Reason, PharmD Clinical Pharmacist [place] [phone]

## 2019-05-22 ENCOUNTER — Ambulatory Visit: Payer: Self-pay | Admitting: Pharmacist

## 2019-05-23 ENCOUNTER — Ambulatory Visit: Payer: Self-pay | Admitting: Family Medicine

## 2019-05-26 ENCOUNTER — Encounter: Payer: Self-pay | Admitting: Family Medicine

## 2019-05-26 ENCOUNTER — Other Ambulatory Visit: Payer: Self-pay

## 2019-05-26 ENCOUNTER — Ambulatory Visit: Payer: Self-pay | Admitting: Pharmacist

## 2019-05-26 ENCOUNTER — Ambulatory Visit (INDEPENDENT_AMBULATORY_CARE_PROVIDER_SITE_OTHER): Payer: PPO | Admitting: Family Medicine

## 2019-05-26 VITALS — BP 132/66 | HR 69 | Temp 97.1°F | Resp 16 | Wt 271.8 lb

## 2019-05-26 DIAGNOSIS — E039 Hypothyroidism, unspecified: Secondary | ICD-10-CM

## 2019-05-26 DIAGNOSIS — E1121 Type 2 diabetes mellitus with diabetic nephropathy: Secondary | ICD-10-CM

## 2019-05-26 DIAGNOSIS — E785 Hyperlipidemia, unspecified: Secondary | ICD-10-CM

## 2019-05-26 DIAGNOSIS — G629 Polyneuropathy, unspecified: Secondary | ICD-10-CM

## 2019-05-26 LAB — POCT GLYCOSYLATED HEMOGLOBIN (HGB A1C): Hemoglobin A1C: 6.2 % — AB (ref 4.0–5.6)

## 2019-05-26 MED ORDER — LANTUS SOLOSTAR 100 UNIT/ML ~~LOC~~ SOPN: PEN_INJECTOR | SUBCUTANEOUS | 3 refills | Status: DC

## 2019-05-26 MED ORDER — NOVOLOG FLEXPEN 100 UNIT/ML ~~LOC~~ SOPN: PEN_INJECTOR | SUBCUTANEOUS | 12 refills | Status: DC

## 2019-05-26 MED ORDER — GABAPENTIN 300 MG PO CAPS: ORAL_CAPSULE | ORAL | 2 refills | Status: DC

## 2019-05-26 NOTE — Chronic Care Management (AMB) (Signed)
  Chronic Care Management   Note  05/26/2019 Name: Jacob Herrera MRN: [id number] DOB: [birth year]  69 y.o. year old male referred to Chronic Care Management by Dr Caryn Section for medication assistance. CCM Pharmacist outreach today to follow up on [employer] application with patient.  Was unable to reach patient via telephone today and unfortunately voicemail had not been set up- unable to leave a message (unsuccessful outreach #1).  Follow up plan: Telephone follow up appointment with care management team member scheduled for: May 26 2019 with PharmD Next PCP appointment scheduled for: May 26 2019 with Dr. Lilly Cove, PharmD Clinical Pharmacist [place] [phone]

## 2019-05-26 NOTE — Patient Instructions (Addendum)
.   Please review the attached list of medications and notify my office if there are any errors.   . Please bring all of your medications to every appointment so we can make sure that our medication list is the same as yours.    Please call [facility] at [phone] to reschedule your colonoscopy

## 2019-05-26 NOTE — Progress Notes (Signed)
Patient: Jacob Herrera Male    DOB: 04/25/1950   69 y.o.   MRN: [id number] Visit Date: 05/26/2019  Today's Provider: Lelon Huh, MD   Chief Complaint  Patient presents with  . Diabetes  . Hypertension   Subjective:     HPI  Diabetes Mellitus Type II, Follow-up:   Lab Results  Component Value Date   HGBA1C 6.7 (A) 01/21/2019   HGBA1C 6.5 (A) 10/04/2018   HGBA1C 6.8 (A) 07/02/2018    Last seen for diabetes 4 months ago.  Management since then includes no changes. He reports excellent compliance with treatment. He is not having side effects.  Current symptoms include paresthesia of the feet and have been unchanged. Home blood sugar records: 140-160 patient states fasting  Episodes of hypoglycemia? no   Current insulin regiment: Novolog and Lantus Most Recent Eye Exam: 03/27/2019 Weight trend: stable Prior visit with dietician: No Current exercise: none Current diet habits: in general, an "unhealthy" diet  Pertinent Labs:    Component Value Date/Time   CHOL 155 10/07/2018 0825   TRIG 247 (H) 10/07/2018 0825   HDL 36 (L) 10/07/2018 0825   LDLCALC 70 10/07/2018 0825   LDLCALC 83 03/20/2017 1503   CREATININE 1.85 (H) 10/07/2018 0825   CREATININE 1.36 (H) 07/08/2013 0555    Wt Readings from Last 3 Encounters:  05/26/19 271 lb 12.8 oz (123.3 kg)  01/21/19 272 lb 8 oz (123.6 kg)  11/26/18 276 lb (125.2 kg)    ------------------------------------------------------------------------  Hypertension, follow-up:  BP Readings from Last 3 Encounters:  05/26/19 132/66  01/21/19 (!) 128/56  11/26/18 (!) 142/60    He was last seen for hypertension 4 months ago.  BP at that visit was 128/56. Management since that visit includes planning to wean clonidine by reducing to 0.1mg  with next refill.On 04/23/19 Chlorthalidone was discontinued due to renal insufficiency by Dr. Juleen China He reports excellent compliance with treatment. He is not having side  effects.  He is not exercising. He is not adherent to low salt diet.   Outside blood pressures are not being checked. He is experiencing lower extremity edema and near-syncope.  Patient denies chest pain, chest pressure/discomfort, claudication, dyspnea, exertional chest pressure/discomfort, fatigue, irregular heart beat, orthopnea, palpitations, paroxysmal nocturnal dyspnea, syncope and tachypnea.   Cardiovascular risk factors include advanced age (older than 42 for men, 26 for women), diabetes mellitus, hypertension and male gender.  Use of agents associated with hypertension: NSAIDS.     Weight trend: stable Wt Readings from Last 3 Encounters:  05/26/19 271 lb 12.8 oz (123.3 kg)  01/21/19 272 lb 8 oz (123.6 kg)  11/26/18 276 lb (125.2 kg)    Current diet: in general, an "unhealthy" diet  ------------------------------------------------------------------------ He states that is having worsening burning pain in feet at night and day. Has been on gabapentin in the past which he did well with.   Allergies  Allergen Reactions  . Spironolactone     gynecomastia      Current Outpatient Medications:  .  albuterol (PROVENTIL) (2.5 MG/3ML) 0.083% nebulizer solution, Take 3 mLs (2.5 mg total) by nebulization every 4 (four) hours. And PRN, Disp: 75 mL, Rfl: 12 .  amLODipine (NORVASC) 10 MG tablet, Take 1 tablet (10 mg total) by mouth daily., Disp: 30 tablet, Rfl: 1 .  aspirin 81 MG tablet, Take 81 mg by mouth daily., Disp: , Rfl:  .  atorvastatin (LIPITOR) 80 MG tablet, Take 1 tablet (80 mg total)  by mouth at bedtime., Disp: 90 tablet, Rfl: 4 .  budesonide-formoterol (SYMBICORT) 80-4.5 MCG/ACT inhaler, Inhale 2 puffs into the lungs 2 (two) times daily., Disp: 3 Inhaler, Rfl: 2 .  carvedilol (COREG) 25 MG tablet, TAKE ONE TABLET BY MOUTH TWICE DAILY, Disp: 180 tablet, Rfl: 1 .  cloNIDine (CATAPRES) 0.2 MG tablet, Take 1 tablet (0.2 mg total) by mouth 2 (two) times daily., Disp: 180 tablet,  Rfl: 3 .  clopidogrel (PLAVIX) 75 MG tablet, Take 1 tablet (75 mg total) by mouth daily., Disp: 30 tablet, Rfl: 11 .  Diphenhyd-Hydrocort-Nystatin (FIRST-DUKES MOUTHWASH) SUSP, Swish and spit 5 mLs 3 (three) times daily., Disp: 240 mL, Rfl: 0 .  Docusate Calcium (STOOL SOFTENER PO), Take 100 mg by mouth as needed. , Disp: , Rfl:  .  doxazosin (CARDURA) 8 MG tablet, Take 1 tablet (8 mg total) by mouth daily., Disp: 90 tablet, Rfl: 3 .  finasteride (PROSCAR) 5 MG tablet, Take 1 tablet (5 mg total) by mouth daily., Disp: 90 tablet, Rfl: 2 .  fluticasone (FLONASE) 50 MCG/ACT nasal spray, Place 2 sprays into both nostrils daily., Disp: 16 g, Rfl: 2 .  furosemide (LASIX) 40 MG tablet, Take 40 mg by mouth 2 (two) times daily. , Disp: , Rfl:  .  glucose blood (ONETOUCH ULTRA) test strip, USE ONE STRIP THREE TIMES DAILY FOR INSULIN DEPENDENT DEPENDENT TYPE 2 DIABETES, Disp: 100 strip, Rfl: 4 .  hydrALAZINE (APRESOLINE) 100 MG tablet, Take 1 tablet (100 mg total) by mouth 3 (three) times daily., Disp: 270 tablet, Rfl: 3 .  insulin aspart (NOVOLOG FLEXPEN) 100 UNIT/ML FlexPen, Take 16 units before lunch., Disp: 15 mL, Rfl: 12 .  Insulin Glargine (LANTUS SOLOSTAR) 100 UNIT/ML Solostar Pen, TITRATE UP TO 80 UNITS, ONCE A DAY, Disp: 20 pen, Rfl: 3 .  Insulin Pen Needle (BD PEN NEEDLE NANO U/F) 32G X 4 MM MISC, USE DAILY AS DIRECTED, Disp: 100 each, Rfl: 3 .  isosorbide mononitrate (IMDUR) 60 MG 24 hr tablet, Take 1 tablet (60 mg total) by mouth daily., Disp: 90 tablet, Rfl: 4 .  levothyroxine (SYNTHROID) 125 MCG tablet, TAKE ONE TABLET BY MOUTH EVERY DAY, Disp: 90 tablet, Rfl: 4 .  losartan (COZAAR) 100 MG tablet, Take 1 tablet (100 mg total) by mouth daily., Disp: 30 tablet, Rfl: 1 .  Lysine 500 MG CAPS, Take 1 capsule by mouth daily., Disp: , Rfl:  .  Naproxen Sodium (ALEVE PO), Take by mouth., Disp: , Rfl:  .  oxyCODONE (OXY IR/ROXICODONE) 5 MG immediate release tablet, Take 1 tablet (5 mg total) by mouth  daily as needed for severe pain., Disp: 30 tablet, Rfl: 0 .  OZEMPIC, 1 MG/DOSE, 2 MG/1.5ML SOPN, INJECT 1MG  INTO THE SKIN ONCE A WEEK, Disp: 3 mL, Rfl: 0 .  gabapentin (NEURONTIN) 300 MG capsule, One capsule at bedtime for 1 week then increase to one capsule twice a day, Disp: 90 capsule, Rfl: 2  Review of Systems  Constitutional: Negative for appetite change, chills and fever.  Respiratory: Negative for chest tightness, shortness of breath and wheezing.   Cardiovascular: Negative for chest pain and palpitations.  Gastrointestinal: Negative for abdominal pain, nausea and vomiting.    Social History   Tobacco Use  . Smoking status: Former Smoker    Packs/day: 1.00    Years: 25.00    Pack years: 25.00    Types: Cigarettes    Quit date: 05/29/1988    Years since quitting: 31.0  . Smokeless tobacco:  Current User    Types: Chew  . Tobacco comment: chews 1 bag of loose leaf chew/week  Substance Use Topics  . Alcohol use: No    Alcohol/week: 0.0 standard drinks      Objective:   BP 132/66   Pulse 69   Temp (!) 97.1 F (36.2 C) (Oral)   Resp 16   Wt 271 lb 12.8 oz (123.3 kg)   SpO2 96%   BMI 41.33 kg/m  Vitals:   05/26/19 1531  BP: 132/66  Pulse: 69  Resp: 16  Temp: (!) 97.1 F (36.2 C)  TempSrc: Oral  SpO2: 96%  Weight: 271 lb 12.8 oz (123.3 kg)  Body mass index is 41.33 kg/m.   Physical Exam   General: Appearance:    Severely obese male in no acute distress  Eyes:    PERRL, conjunctiva/corneas clear, EOM's intact       Lungs:     Clear to auscultation bilaterally, respirations unlabored  Heart:    Normal heart rate. Normal rhythm. No murmurs, rubs, or gallops.   MS:   All extremities are intact.  Diabetic foot exam was performed with the following findings:   No deformities, ulcerations, or other skin breakdown 2+ bipedal edema. Diffuse scaling and calluses.No open sores.      Neurologic:   Awake, alert, oriented x 3. No apparent focal neurological            defect.        Results for orders placed or performed in visit on 05/26/19  POCT HgB A1C  Result Value Ref Range   Hemoglobin A1C 6.2 (A) 4.0 - 5.6 %   HbA1c POC (<> result, manual entry)     HbA1c, POC (prediabetic range)     HbA1c, POC (controlled diabetic range)          Assessment & Plan    1. Diabetes mellitus with nephropathy ([place]) a1c well controlled, Continue current medications.   - VITAMIN D 25 Hydroxy (Vit-D Deficiency, Fractures) - Insulin Glargine (LANTUS SOLOSTAR) 100 UNIT/ML Solostar Pen; TITRATE UP TO 80 UNITS, ONCE A DAY  Dispense: 20 pen; Refill: 3 - insulin aspart (NOVOLOG FLEXPEN) 100 UNIT/ML FlexPen; Take 16 units before lunch.  Dispense: 15 mL; Refill: 12  2. Morbid obesity due to excess calories ([place]) Encouraged healthier eating habits and exercising more frequently.   3. Neuropathy Start back on  - gabapentin (NEURONTIN) 300 MG capsule; One capsule at bedtime for 1 week then increase to one capsule twice a day  Dispense: 90 capsule; Refill: 2 - Vitamin B12  4. Hypothyroidism, unspecified type  - TSH  5. Hyperlipidemia, unspecified hyperlipidemia type He is tolerating atorvastatin well with no adverse effects.   - Lipid panel     Lelon Huh, MD  [facility]

## 2019-05-26 NOTE — Chronic Care Management (AMB) (Signed)
  Chronic Care Management   Care Coordination Note  05/26/2019 Name: Jacob Herrera MRN: [id number] DOB: November 25, 1950  Care Coordination: Patient provided [employer] application forms for Lantus assistance.   Goals    . Medication assistance plan 2021 (pt-stated)     Current Barriers:  . financial  Pharmacist Clinical Goal(s): Over the next 14 days, Mr.. Golla will provide the necessary supplementary documents (proof of out of pocket prescription expenditure, proof of household income) needed for medication assistance applications to CCM pharmacist.   Interventions: . CCM pharmacist will apply for medication assistance program for Ozempic, Novolog made by [employer] and prescribed by Dr. Caryn Section o Updated 1/4: reviewed accepted financial documents, patient has not received 2020 annual 1099 form o Updated 1/22: per representative, financial records were difficult to read and need to be re-faxed . In February 2021, CCM pharmacist and patient will apply for medication assistance program for Lantus made by [employer] and prescribed by Dr. Caryn Section  . Updated 1/15: received completed application for Ozempic, Novolog and submitted to [employer]; uploaded copy under media tab . Updated 2/1: patient provided application materials for [employer]/Lantus application following appointment with Dr. Caryn Section.  Patient Self Care Activities:  [name] Kitchen Gather necessary documents needed to apply for medication assistance  Please see past updates related to this goal by clicking on the "Past Updates" button in the selected goal          Follow up plan: Telephone follow up appointment with care management team member scheduled for: 1 week  Ruben Reason, PharmD Clinical Pharmacist [place] [phone]

## 2019-05-27 ENCOUNTER — Other Ambulatory Visit: Payer: Self-pay | Admitting: Family Medicine

## 2019-05-27 ENCOUNTER — Encounter: Payer: Self-pay | Admitting: Family Medicine

## 2019-05-27 ENCOUNTER — Telehealth: Payer: Self-pay

## 2019-05-27 DIAGNOSIS — E538 Deficiency of other specified B group vitamins: Secondary | ICD-10-CM | POA: Insufficient documentation

## 2019-05-27 DIAGNOSIS — E1121 Type 2 diabetes mellitus with diabetic nephropathy: Secondary | ICD-10-CM

## 2019-05-27 DIAGNOSIS — E559 Vitamin D deficiency, unspecified: Secondary | ICD-10-CM | POA: Insufficient documentation

## 2019-05-27 LAB — TSH: TSH: 2.72 u[IU]/mL (ref 0.450–4.500)

## 2019-05-27 LAB — LIPID PANEL
Chol/HDL Ratio: 3.6 ratio (ref 0.0–5.0)
Cholesterol, Total: 119 mg/dL (ref 100–199)
HDL: 33 mg/dL — ABNORMAL LOW (ref >39)
LDL Chol Calc (NIH): 46 mg/dL (ref 0–99)
Triglycerides: 252 mg/dL — ABNORMAL HIGH (ref 0–149)
VLDL Cholesterol Cal: 40 mg/dL (ref 5–40)

## 2019-05-27 LAB — VITAMIN B12: Vitamin B-12: 208 pg/mL — ABNORMAL LOW (ref 232–1245)

## 2019-05-27 LAB — VITAMIN D 25 HYDROXY (VIT D DEFICIENCY, FRACTURES): Vit D, 25-Hydroxy: 22.8 ng/mL — ABNORMAL LOW (ref 30.0–100.0)

## 2019-05-27 NOTE — Telephone Encounter (Signed)
-----   Message from [name], MD sent at 05/27/2019  7:40 AM EST ----- labs show he is deficient in vitamin D and vitamin B12, both of which can make neuropathy worse. need to take OTC vitamin b12 1000mg  once a day and OTC vitamin D3, 1000 units every day. can still take gabapentin to help with neuropathy, but the vitamin should also help.

## 2019-05-27 NOTE — Telephone Encounter (Signed)
Tried calling patient. No answer. I received a message saying the patient has a voice mailbox that has not been set up yet. Will try calling back at a later time.

## 2019-05-28 NOTE — Telephone Encounter (Signed)
Requested Prescriptions  Pending Prescriptions Disp Refills  . OZEMPIC, 1 MG/DOSE, 2 MG/1.5ML SOPN [Pharmacy Med Name: OZEMPIC (1 MG/DOSE) 2 MG/1.5ML SUBQ] 3 mL 0    Sig: INJECT 1MG  INTO THE SKIN ONCE A WEEK     Endocrinology:  Diabetes - GLP-1 Receptor Agonists Passed - 05/27/2019  7:27 PM      Passed - HBA1C is between 0 and 7.9 and within 180 days    Hemoglobin A1C  Date Value Ref Range Status  05/26/2019 6.2 (A) 4.0 - 5.6 % Final   Hgb A1c MFr Bld  Date Value Ref Range Status  03/20/2017 6.3 (H) <5.7 % of total Hgb Final    Comment:    For someone without known diabetes, a hemoglobin  A1c value between 5.7% and 6.4% is consistent with prediabetes and should be confirmed with a  follow-up test. . For someone with known diabetes, a value <7% indicates that their diabetes is well controlled. A1c targets should be individualized based on duration of diabetes, age, comorbid conditions, and other considerations. . This assay result is consistent with an increased risk of diabetes. . Currently, no consensus exists regarding use of hemoglobin A1c for diagnosis of diabetes for children. Renella Cunas - Valid encounter within last 6 months    Recent Outpatient Visits          2 days ago Diabetes mellitus with nephropathy [facility])   [facility] [name], MD   1 month ago Sore throat   [place], [place], [place]   4 months ago Resistant hypertension   [facility] [name], MD   6 months ago Carpal tunnel syndrome on right   [facility] [name], MD   7 months ago Diabetes mellitus with nephropathy [facility])   [facility] [name], MD      Future Appointments            In 5 months [place], Ronda Fairly, MD [place]

## 2019-05-30 NOTE — Telephone Encounter (Signed)
Patient advised as below. Patient verbalizes understanding and is in agreement with treatment plan.  

## 2019-06-18 DIAGNOSIS — I5032 Chronic diastolic (congestive) heart failure: Secondary | ICD-10-CM | POA: Diagnosis not present

## 2019-06-18 DIAGNOSIS — I25110 Atherosclerotic heart disease of native coronary artery with unstable angina pectoris: Secondary | ICD-10-CM | POA: Diagnosis not present

## 2019-06-18 DIAGNOSIS — I10 Essential (primary) hypertension: Secondary | ICD-10-CM | POA: Diagnosis not present

## 2019-06-20 ENCOUNTER — Encounter: Payer: Self-pay | Admitting: Family Medicine

## 2019-06-24 ENCOUNTER — Other Ambulatory Visit: Payer: Self-pay | Admitting: Family Medicine

## 2019-06-24 DIAGNOSIS — Z85828 Personal history of other malignant neoplasm of skin: Secondary | ICD-10-CM | POA: Diagnosis not present

## 2019-06-24 DIAGNOSIS — D485 Neoplasm of uncertain behavior of skin: Secondary | ICD-10-CM | POA: Diagnosis not present

## 2019-06-24 DIAGNOSIS — X32XXXA Exposure to sunlight, initial encounter: Secondary | ICD-10-CM | POA: Diagnosis not present

## 2019-06-24 DIAGNOSIS — E1121 Type 2 diabetes mellitus with diabetic nephropathy: Secondary | ICD-10-CM

## 2019-06-24 DIAGNOSIS — Z09 Encounter for follow-up examination after completed treatment for conditions other than malignant neoplasm: Secondary | ICD-10-CM | POA: Diagnosis not present

## 2019-06-24 DIAGNOSIS — C44319 Basal cell carcinoma of skin of other parts of face: Secondary | ICD-10-CM | POA: Diagnosis not present

## 2019-06-24 DIAGNOSIS — L400 Psoriasis vulgaris: Secondary | ICD-10-CM | POA: Diagnosis not present

## 2019-06-24 DIAGNOSIS — L570 Actinic keratosis: Secondary | ICD-10-CM | POA: Diagnosis not present

## 2019-06-24 DIAGNOSIS — Z85820 Personal history of malignant melanoma of skin: Secondary | ICD-10-CM | POA: Diagnosis not present

## 2019-06-25 NOTE — Telephone Encounter (Signed)
Requested Prescriptions  Pending Prescriptions Disp Refills  . OZEMPIC, 1 MG/DOSE, 2 MG/1.5ML SOPN [Pharmacy Med Name: OZEMPIC (1 MG/DOSE) 2 MG/1.5ML SUBQ] 3 mL 0    Sig: INJECT 1MG  INTO THE SKIN ONCE A WEEK     Endocrinology:  Diabetes - GLP-1 Receptor Agonists Passed - 06/24/2019  6:51 PM      Passed - HBA1C is between 0 and 7.9 and within 180 days    Hemoglobin A1C  Date Value Ref Range Status  05/26/2019 6.2 (A) 4.0 - 5.6 % Final   Hgb A1c MFr Bld  Date Value Ref Range Status  03/20/2017 6.3 (H) <5.7 % of total Hgb Final    Comment:    For someone without known diabetes, a hemoglobin  A1c value between 5.7% and 6.4% is consistent with prediabetes and should be confirmed with a  follow-up test. . For someone with known diabetes, a value <7% indicates that their diabetes is well controlled. A1c targets should be individualized based on duration of diabetes, age, comorbid conditions, and other considerations. . This assay result is consistent with an increased risk of diabetes. . Currently, no consensus exists regarding use of hemoglobin A1c for diagnosis of diabetes for children. Renella Cunas - Valid encounter within last 6 months    Recent Outpatient Visits          1 month ago Diabetes mellitus with nephropathy [facility])   [facility] [name], MD   2 months ago Sore throat   [place], [place], [place]   5 months ago Resistant hypertension   [facility] [name], MD   7 months ago Carpal tunnel syndrome on right   [facility] [name], MD   8 months ago Diabetes mellitus with nephropathy [facility])   [facility] [name], MD      Future Appointments            In 4 months [place], MD [place]

## 2019-06-30 ENCOUNTER — Other Ambulatory Visit: Payer: Self-pay | Admitting: Family Medicine

## 2019-06-30 DIAGNOSIS — C44319 Basal cell carcinoma of skin of other parts of face: Secondary | ICD-10-CM | POA: Diagnosis not present

## 2019-06-30 DIAGNOSIS — E1121 Type 2 diabetes mellitus with diabetic nephropathy: Secondary | ICD-10-CM

## 2019-07-08 ENCOUNTER — Other Ambulatory Visit: Payer: Self-pay | Admitting: Family Medicine

## 2019-07-08 DIAGNOSIS — I10 Essential (primary) hypertension: Secondary | ICD-10-CM

## 2019-07-17 IMAGING — US US EXTREM LOW VENOUS BILAT
1 series · 13 of 24 positions shown · non-contrast
Comparison: None.

CLINICAL DATA: 67-year-old male with bilateral lower extremity
swelling



[Series 1: us extrem low venous bilat · 13 of 60 slices shown]
[im 1/60]
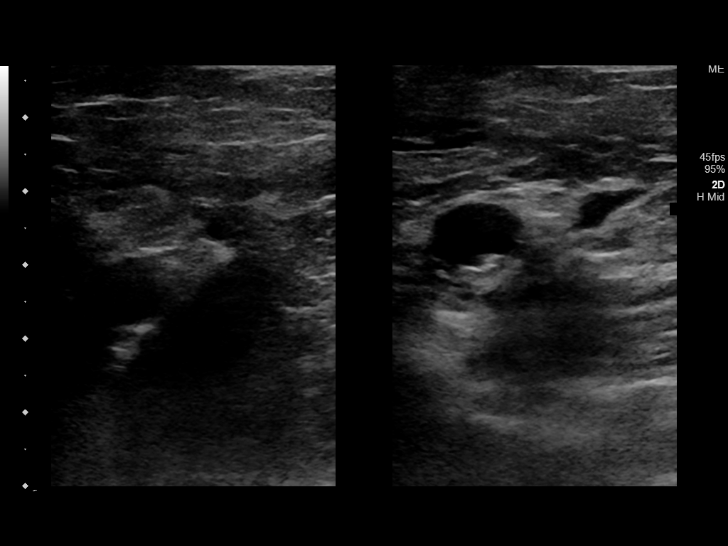
[im 6/60]
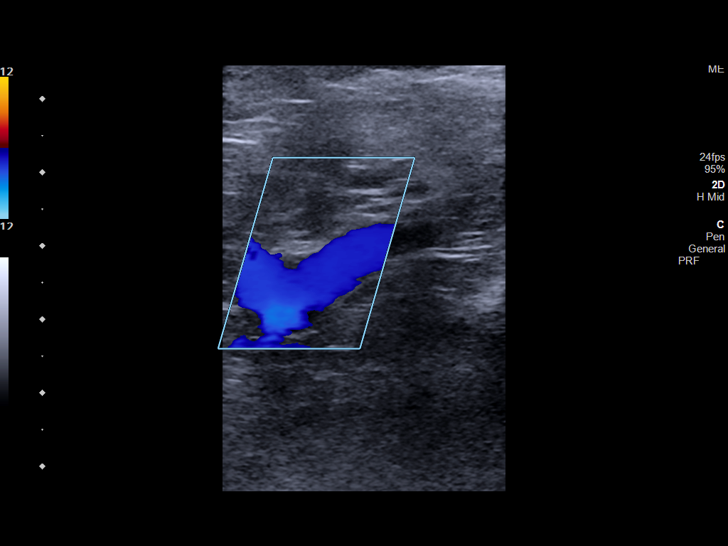
[im 11/60]
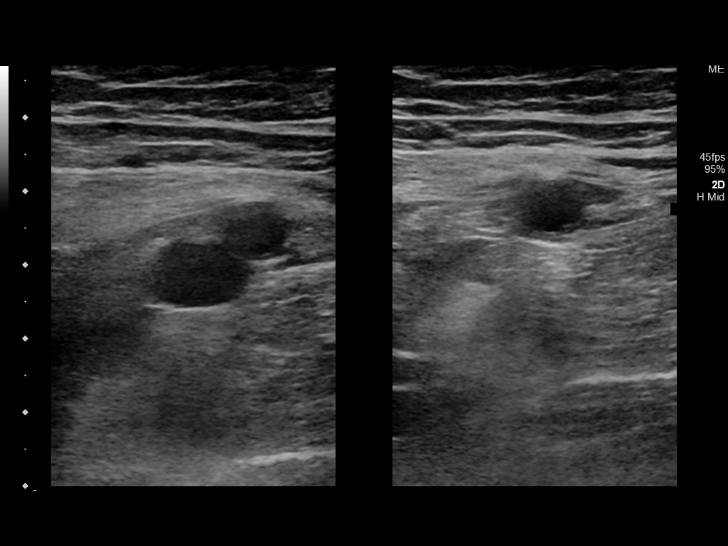
[im 16/60]
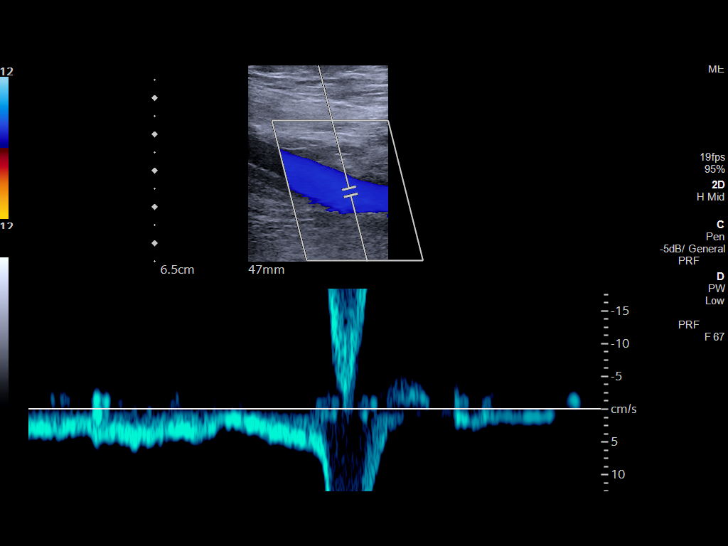
[im 21/60]
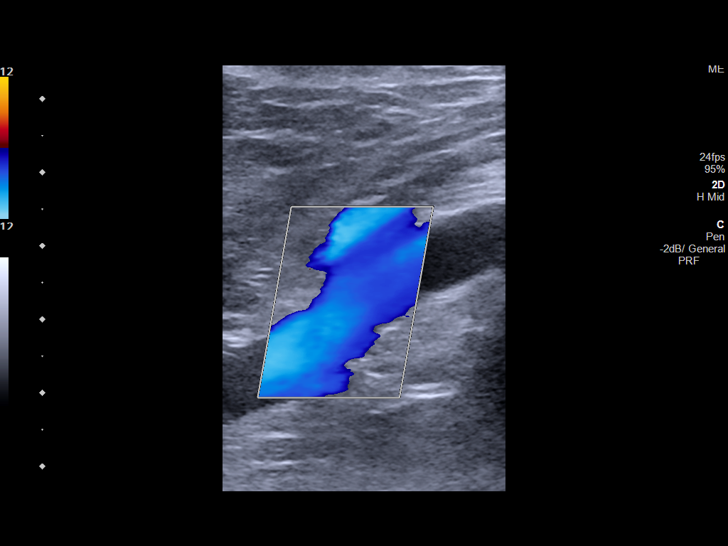
[im 26/60]
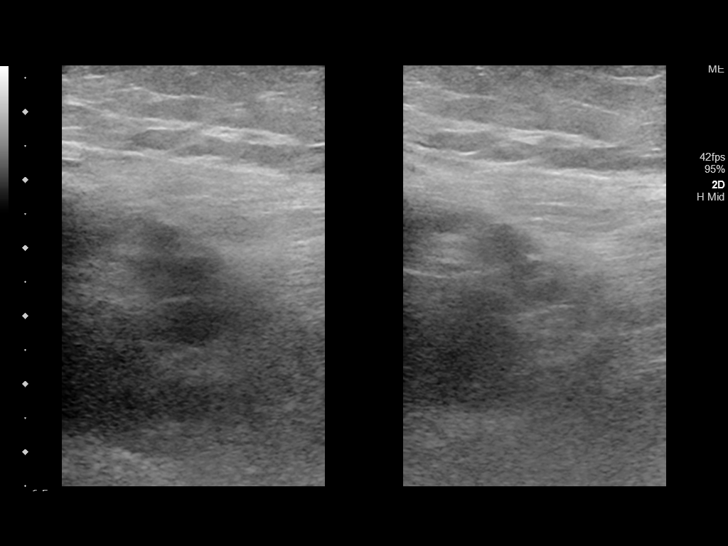
[im 31/60]
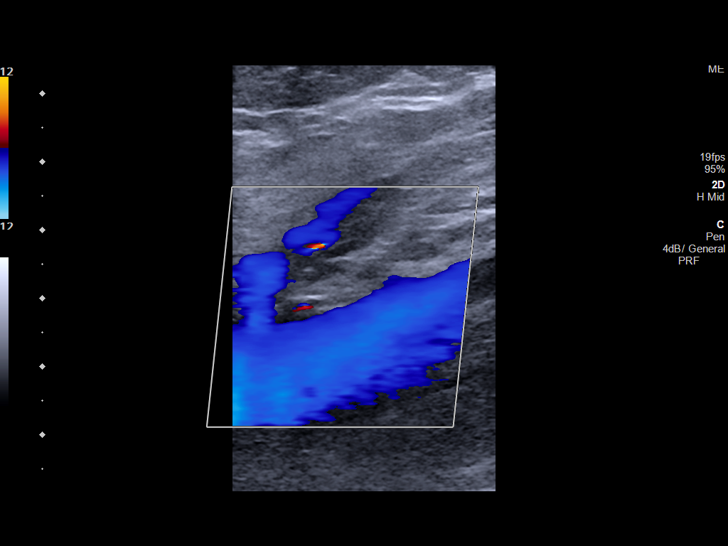
[im 34/60]
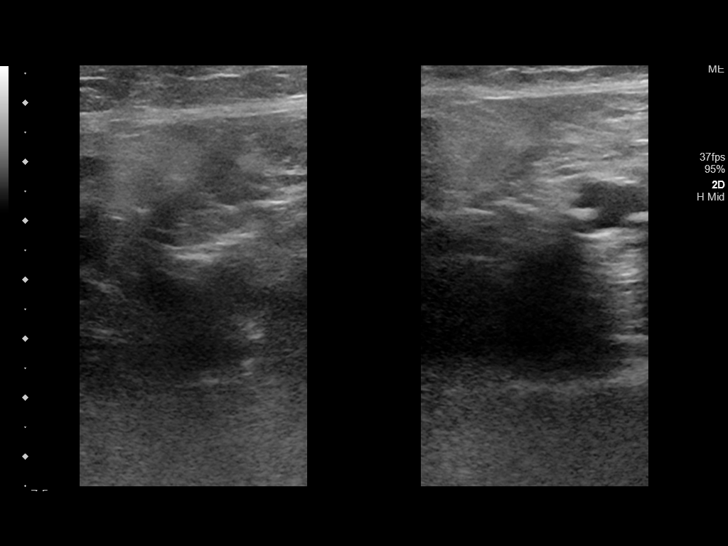
[im 39/60]
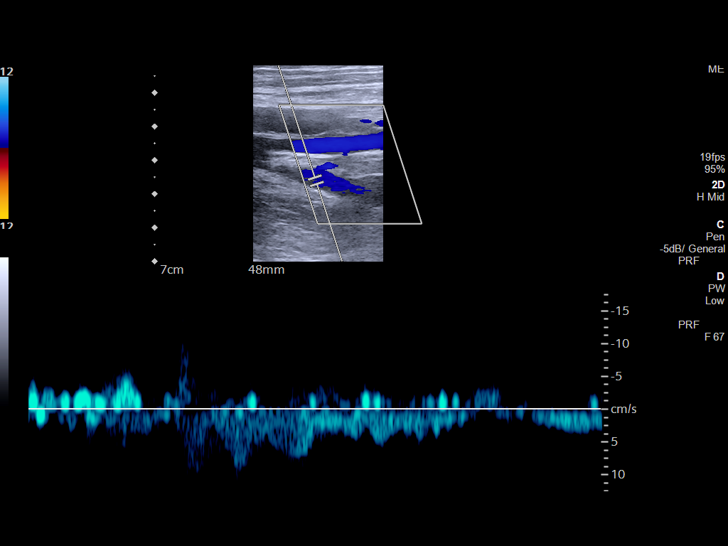
[im 44/60]
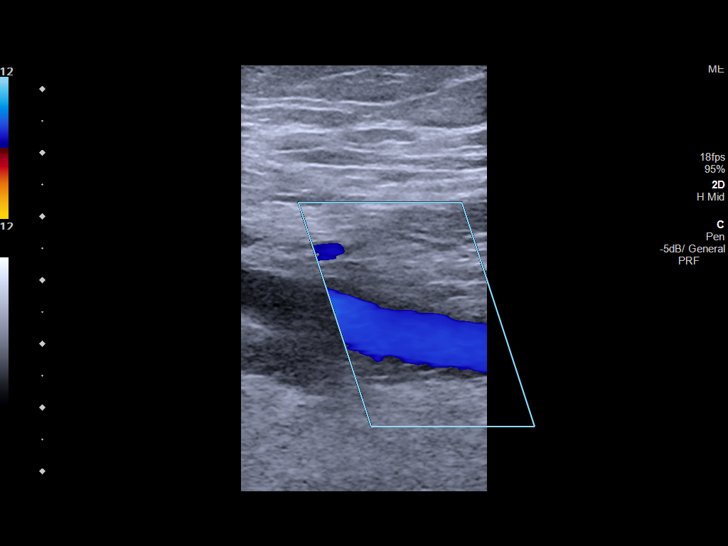
[im 49/60]
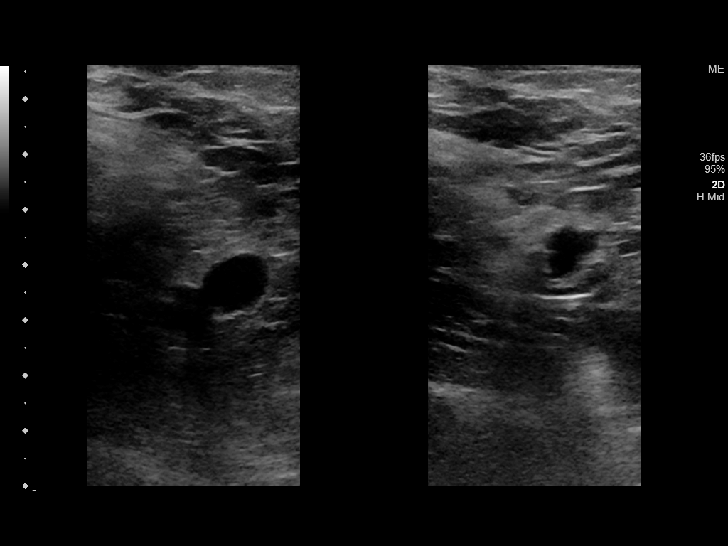
[im 54/60]
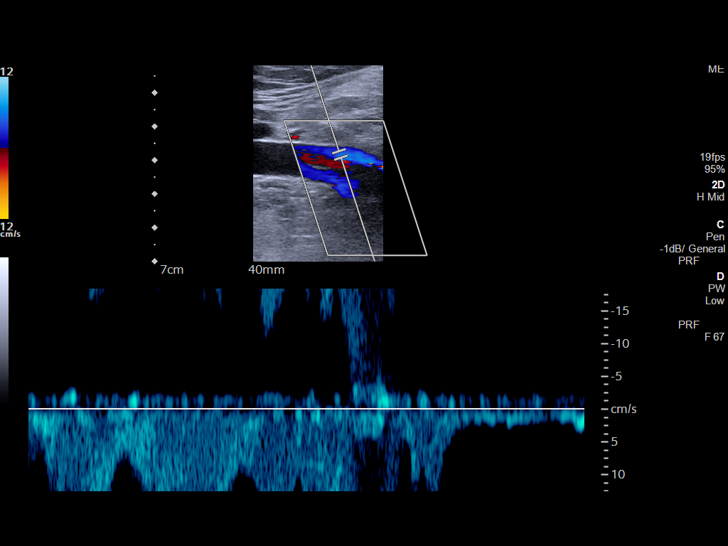
[im 60/60]
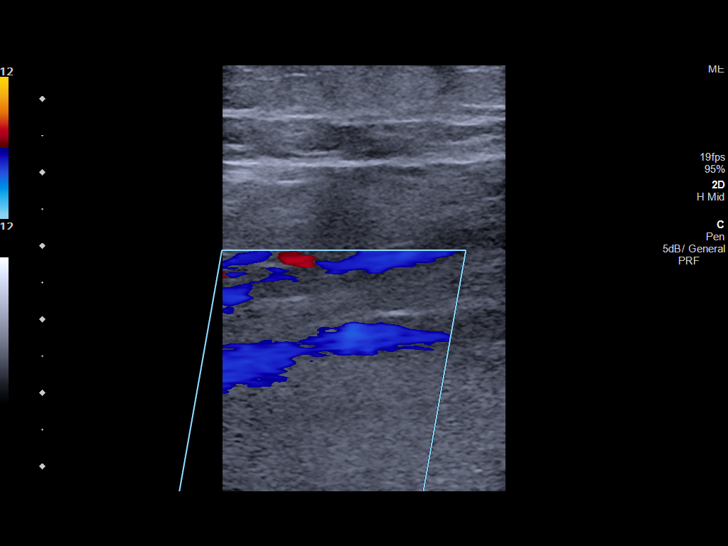

[13 of 24 positions shown; findings below may reference images not displayed]

FINDINGS: RIGHT LOWER EXTREMITY

Common Femoral Vein: No evidence of thrombus. Normal
compressibility, respiratory phasicity and response to augmentation.

Saphenofemoral Junction: No evidence of thrombus. Normal
compressibility and flow on color Doppler imaging.

Profunda Femoral Vein: No evidence of thrombus. Normal
compressibility and flow on color Doppler imaging.

Femoral Vein: No evidence of thrombus. Normal compressibility,
respiratory phasicity and response to augmentation.

Popliteal Vein: No evidence of thrombus. Normal compressibility,
respiratory phasicity and response to augmentation.

Calf Veins: No evidence of thrombus. Normal compressibility and flow
on color Doppler imaging.

Superficial Great Saphenous Vein: No evidence of thrombus. Normal
compressibility.

Venous Reflux:  None.

Other Findings:  None.

LEFT LOWER EXTREMITY

Common Femoral Vein: No evidence of thrombus. Normal
compressibility, respiratory phasicity and response to augmentation.

Saphenofemoral Junction: No evidence of thrombus. Normal
compressibility and flow on color Doppler imaging.

Profunda Femoral Vein: No evidence of thrombus. Normal
compressibility and flow on color Doppler imaging.

Femoral Vein: No evidence of thrombus. Normal compressibility,
respiratory phasicity and response to augmentation.

Popliteal Vein: No evidence of thrombus. Normal compressibility,
respiratory phasicity and response to augmentation.

Calf Veins: No evidence of thrombus. Normal compressibility and flow
on color Doppler imaging.

Superficial Great Saphenous Vein: No evidence of thrombus. Normal
compressibility.

Venous Reflux:  None.

Other Findings:  None.
IMPRESSION: No evidence of deep venous thrombosis.

## 2019-07-29 DIAGNOSIS — R809 Proteinuria, unspecified: Secondary | ICD-10-CM | POA: Diagnosis not present

## 2019-07-29 DIAGNOSIS — N2581 Secondary hyperparathyroidism of renal origin: Secondary | ICD-10-CM | POA: Diagnosis not present

## 2019-07-29 DIAGNOSIS — N041 Nephrotic syndrome with focal and segmental glomerular lesions: Secondary | ICD-10-CM | POA: Diagnosis not present

## 2019-07-29 DIAGNOSIS — N1832 Chronic kidney disease, stage 3b: Secondary | ICD-10-CM | POA: Diagnosis not present

## 2019-07-29 DIAGNOSIS — I129 Hypertensive chronic kidney disease with stage 1 through stage 4 chronic kidney disease, or unspecified chronic kidney disease: Secondary | ICD-10-CM | POA: Diagnosis not present

## 2019-08-04 DIAGNOSIS — N1832 Chronic kidney disease, stage 3b: Secondary | ICD-10-CM | POA: Diagnosis not present

## 2019-08-04 DIAGNOSIS — N041 Nephrotic syndrome with focal and segmental glomerular lesions: Secondary | ICD-10-CM | POA: Diagnosis not present

## 2019-08-04 DIAGNOSIS — R809 Proteinuria, unspecified: Secondary | ICD-10-CM | POA: Diagnosis not present

## 2019-08-04 DIAGNOSIS — N2581 Secondary hyperparathyroidism of renal origin: Secondary | ICD-10-CM | POA: Diagnosis not present

## 2019-08-04 DIAGNOSIS — I129 Hypertensive chronic kidney disease with stage 1 through stage 4 chronic kidney disease, or unspecified chronic kidney disease: Secondary | ICD-10-CM | POA: Diagnosis not present

## 2019-08-27 ENCOUNTER — Other Ambulatory Visit: Payer: Self-pay | Admitting: Family Medicine

## 2019-08-27 DIAGNOSIS — G629 Polyneuropathy, unspecified: Secondary | ICD-10-CM

## 2019-09-09 ENCOUNTER — Telehealth: Payer: Self-pay | Admitting: Family Medicine

## 2019-09-09 NOTE — Chronic Care Management (AMB) (Signed)
  Care Management   Note  09/09/2019 Name: Jacob Herrera MRN: [id number] DOB: 04-Sep-1950  Jacob Herrera is a 69 y.o. year old male who is a primary care patient of Caryn Section, [name], MD and is actively engaged with the care management team. I reached out to [name] by phone today to assist with scheduling an initial visit with the Pharmacist.  Follow up plan: Patient declines further follow up and engagement by the care management team. Appropriate care team members and provider have been notified via electronic communication. The care management team is available to follow up with the patient after provider conversation with the patient regarding recommendation for care management engagement and subsequent re-referral to the care management team.   [place], [place], [place] [address] Direct Dial: [email or web address] Website: [email or web address]

## 2019-09-17 DIAGNOSIS — G4733 Obstructive sleep apnea (adult) (pediatric): Secondary | ICD-10-CM | POA: Diagnosis not present

## 2019-09-17 DIAGNOSIS — J45909 Unspecified asthma, uncomplicated: Secondary | ICD-10-CM | POA: Diagnosis not present

## 2019-09-22 ENCOUNTER — Other Ambulatory Visit: Payer: Self-pay | Admitting: Family Medicine

## 2019-09-22 DIAGNOSIS — I10 Essential (primary) hypertension: Secondary | ICD-10-CM

## 2019-10-17 NOTE — Progress Notes (Signed)
I,Roshena L Chambers,acting as a scribe for Lelon Huh, MD.,have documented all relevant documentation on the behalf of Lelon Huh, MD,as directed by  Lelon Huh, MD while in the presence of Lelon Huh, MD.   Established patient visit   Patient: Jacob Herrera   DOB: Nov 26, 1950   69 y.o. Male  MRN: [id number] Visit Date: 10/20/2019  Today's healthcare provider: Lelon Huh, MD   Chief Complaint  Patient presents with  . Diabetes  . Hyperlipidemia  . Hypothyroidism   Subjective    HPI  Diabetes Mellitus Type II, Follow-up  Lab Results  Component Value Date   HGBA1C 6.2 (A) 05/26/2019   HGBA1C 6.7 (A) 01/21/2019   HGBA1C 6.5 (A) 10/04/2018   Wt Readings from Last 3 Encounters:  10/20/19 269 lb (122 kg)  05/26/19 271 lb 12.8 oz (123.3 kg)  01/21/19 272 lb 8 oz (123.6 kg)   Last seen for diabetes 4 months ago.  Management since then includes no changes. He reports good compliance with treatment. He is not having side effects.  Symptoms: Yes fatigue No foot ulcerations  No appetite changes No nausea  Yes paresthesia of the feet  No polydipsia  No polyuria No visual disturbances   No vomiting     Home blood sugar records: fasting range: less than 150  Episodes of hypoglycemia? No    Current insulin regiment: Lantus 30-40 units daily, Novolog 8-20 and Ozempic Most Recent Eye Exam: 03/27/2019 Current exercise: none Current diet habits: in general, an "unhealthy" diet  Pertinent Labs: Lab Results  Component Value Date   CHOL 119 05/26/2019   HDL 33 (L) 05/26/2019   LDLCALC 46 05/26/2019   TRIG 252 (H) 05/26/2019   CHOLHDL 3.6 05/26/2019   Lab Results  Component Value Date   NA 137 10/07/2018   K 3.8 10/07/2018   CREATININE 1.85 (H) 10/07/2018   GFRNONAA 36 (L) 10/07/2018   GFRAA 42 (L) 10/07/2018   GLUCOSE 134 (H) 10/07/2018      --------------------------------------------------------------------------------------------------- Lipid/Cholesterol, Follow-up  Last lipid panel Other pertinent labs  Lab Results  Component Value Date   CHOL 119 05/26/2019   HDL 33 (L) 05/26/2019   LDLCALC 46 05/26/2019   TRIG 252 (H) 05/26/2019   CHOLHDL 3.6 05/26/2019   Lab Results  Component Value Date   ALT 17 10/07/2018   AST 23 10/07/2018   PLT 241 11/09/2014   TSH 2.720 05/26/2019     He was last seen for this 4 months ago.  Management since that visit includes no changes.  He reports good compliance with treatment. He is not having side effects.   Symptoms: No chest pain No chest pressure/discomfort  No dyspnea Yes lower extremity edema  Yes numbness or tingling of extremity No orthopnea  No palpitations No paroxysmal nocturnal dyspnea  No speech difficulty No syncope   Current diet: in general, an "unhealthy" diet Current exercise: none  The ASCVD Risk score ([place]., et al., 2013) failed to calculate for the following reasons:   The valid total cholesterol range is 130 to 320 mg/dL  --------------------------------------------------------------------------------------------------- Hypothyroid, follow-up  Lab Results  Component Value Date   TSH 2.720 05/26/2019   TSH 3.480 12/21/2016   TSH CANCELED 11/10/2015   Wt Readings from Last 3 Encounters:  10/20/19 269 lb (122 kg)  05/26/19 271 lb 12.8 oz (123.3 kg)  01/21/19 272 lb 8 oz (123.6 kg)    He was last seen for hypothyroid 4  months ago.  Management since that visit includes no changes. He reports good compliance with treatment. He is not having side effects.   Symptoms: Yes change in energy level No constipation  No diarrhea No heat / cold intolerance  No nervousness No palpitations  No weight changes    -----------------------------------------------------------------------------------------  Follow up for Neuropathy:  The  patient was last seen for this 4 months ago. Changes made at last visit include start back on Gabapentin 300 mg BID.  He reports good compliance with treatment. He feels that condition is Unchanged. He is not having side effects.   ----------------------------------------------------------------------------------------- Follow up for Vitamin B 12 and D Deficiency:   The patient was last seen for this 4 months ago. Changes made at last visit include start Vitamin B12 1000 mg daily and start Vitakin D3 1000 IU daily.  He reports good compliance with treatment. He is not sure what dose of Vitamin B12 and Vitamin D3 he is taking. He feels that condition is Unchanged. He is not having side effects.  Lab Results  Component Value Date   VITAMINB12 208 (L) 05/26/2019   Lab Results  Component Value Date   VD25OH 22.8 (L) 05/26/2019    -----------------------------------------------------------------------------------------       Medications: Outpatient Medications Prior to Visit  Medication Sig  . albuterol (PROVENTIL) (2.5 MG/3ML) 0.083% nebulizer solution Take 3 mLs (2.5 mg total) by nebulization every 4 (four) hours. And PRN  . amLODipine (NORVASC) 10 MG tablet Take 1 tablet (10 mg total) by mouth daily.  [name] Kitchen aspirin 81 MG tablet Take 81 mg by mouth daily.  [name] Kitchen atorvastatin (LIPITOR) 80 MG tablet Take 1 tablet (80 mg total) by mouth at bedtime.  . budesonide-formoterol (SYMBICORT) 80-4.5 MCG/ACT inhaler Inhale 2 puffs into the lungs 2 (two) times daily.  . carvedilol (COREG) 25 MG tablet TAKE 1 TABLET BY MOUTH TWICE DAILY  . Cholecalciferol (VITAMIN D3 PO) Take 1 tablet by mouth daily.  . cloNIDine (CATAPRES) 0.2 MG tablet TAKE ONE TABLET BY MOUTH TWICE DAILY  . clopidogrel (PLAVIX) 75 MG tablet Take 1 tablet (75 mg total) by mouth daily.  . Cyanocobalamin (VITAMIN B 12 PO) Take by mouth.  . doxazosin (CARDURA) 8 MG tablet Take 1 tablet (8 mg total) by mouth daily.  . finasteride  (PROSCAR) 5 MG tablet Take 1 tablet (5 mg total) by mouth daily.  . fluticasone (FLONASE) 50 MCG/ACT nasal spray Place 2 sprays into both nostrils daily.  . furosemide (LASIX) 40 MG tablet Take 40 mg by mouth 2 (two) times daily.   [name] Kitchen gabapentin (NEURONTIN) 300 MG capsule TAKE 1 CAPSULE BY MOUTH AT BEDTIME FOR 1WEEK, THEN INCREASE TO 1 CAPSULE TWICE DAILY AS DIRECTED  . glucose blood (ONETOUCH ULTRA) test strip USE ONE STRIP THREE TIMES DAILY FOR INSULIN DEPENDENT DEPENDENT TYPE 2 DIABETES  . hydrALAZINE (APRESOLINE) 100 MG tablet Take 1 tablet (100 mg total) by mouth 3 (three) times daily.  . insulin aspart (NOVOLOG FLEXPEN) 100 UNIT/ML FlexPen Take 16 units before lunch.  . Insulin Glargine (LANTUS SOLOSTAR) 100 UNIT/ML Solostar Pen TITRATE UP TO 80 UNITS, ONCE A DAY  . Insulin Pen Needle (BD PEN NEEDLE NANO U/F) 32G X 4 MM MISC USE DAILY AS DIRECTED  . isosorbide mononitrate (IMDUR) 60 MG 24 hr tablet Take 1 tablet (60 mg total) by mouth daily.  [name] Kitchen levothyroxine (SYNTHROID) 125 MCG tablet TAKE ONE TABLET BY MOUTH EVERY DAY  . losartan (COZAAR) 100 MG tablet Take 1 tablet (  100 mg total) by mouth daily.  [name] Kitchen Lysine 500 MG CAPS Take 1 capsule by mouth daily.  [name] Kitchen oxyCODONE (OXY IR/ROXICODONE) 5 MG immediate release tablet Take 1 tablet (5 mg total) by mouth daily as needed for severe pain.  [name] Kitchen OZEMPIC, 1 MG/DOSE, 2 MG/1.5ML SOPN INJECT 1MG  INTO THE SKIN ONCE A WEEK  . SENNA PO Take 1 capsule by mouth in the morning, at noon, in the evening, and at bedtime.  . Naproxen Sodium (ALEVE PO) Take by mouth. (Patient not taking: Reported on 10/20/2019)  . [DISCONTINUED] Diphenhyd-Hydrocort-Nystatin (FIRST-DUKES MOUTHWASH) SUSP Swish and spit 5 mLs 3 (three) times daily. (Patient not taking: Reported on 10/20/2019)  . [DISCONTINUED] Docusate Calcium (STOOL SOFTENER PO) Take 100 mg by mouth as needed.  (Patient not taking: Reported on 10/20/2019)   No facility-administered medications prior to visit.    Review of  Systems  Constitutional: Positive for fatigue. Negative for appetite change, chills and fever.  Respiratory: Negative for chest tightness, shortness of breath and wheezing.   Cardiovascular: Positive for leg swelling. Negative for chest pain and palpitations.  Gastrointestinal: Negative for abdominal pain, nausea and vomiting.  Neurological: Positive for numbness (in feet).      Objective    BP (!) 158/70 (BP Location: Right Arm, Cuff Size: Large)   Pulse 69   Temp (!) 97.1 F (36.2 C) (Temporal)   Wt 269 lb (122 kg)   SpO2 96% Comment: room air  BMI 40.90 kg/m    Physical Exam   General: Appearance:    Severely obese male in no acute distress  Eyes:    PERRL, conjunctiva/corneas clear, EOM's intact       Lungs:     Clear to auscultation bilaterally, respirations unlabored  Heart:    Normal heart rate. Normal rhythm. No murmurs, rubs, or gallops.   MS:   All extremities are intact.   Neurologic:   Awake, alert, oriented x 3. No apparent focal neurological           defect.        Results for orders placed or performed in visit on 10/20/19  POCT HgB A1C  Result Value Ref Range   Hemoglobin A1C 6.4 (A) 4.0 - 5.6 %   Est. average glucose Bld gHb Est-mCnc 137     Assessment & Plan     1. Diabetes mellitus with nephropathy ([place]) Well controlled, but having trouble affording diabetic medications. He was on patient assistance last year and would like help getting re-enrolled.  - Referral to Chronic Care Management Services  2. Chronic diastolic heart failure (HCC) Currently very well compensated. Continue current medications.  Continue routine follow up Dr. Ubaldo Glassing.   3. Resistant hypertension BP up today, although he admits to eating more salty foods especially country ham lately. He is going to work on improving diet, continue medications. Continue follow up nephrology.   4. Stage 3b chronic kidney disease Continue routine follow up Dr. Juleen China.   Follow up 4 months.       The entirety of the information documented in the History of Present Illness, Review of Systems and Physical Exam were personally obtained by me. Portions of this information were initially documented by the CMA and reviewed by me for thoroughness and accuracy.      Lelon Huh, MD  [facility] [phone] (phone) [phone] (fax)  [place]

## 2019-10-20 ENCOUNTER — Encounter: Payer: Self-pay | Admitting: Family Medicine

## 2019-10-20 ENCOUNTER — Telehealth: Payer: Self-pay | Admitting: Family Medicine

## 2019-10-20 ENCOUNTER — Ambulatory Visit (INDEPENDENT_AMBULATORY_CARE_PROVIDER_SITE_OTHER): Payer: PPO | Admitting: Family Medicine

## 2019-10-20 ENCOUNTER — Other Ambulatory Visit: Payer: Self-pay

## 2019-10-20 ENCOUNTER — Other Ambulatory Visit: Payer: Self-pay | Admitting: Family Medicine

## 2019-10-20 VITALS — BP 158/70 | HR 69 | Temp 97.1°F | Wt 269.0 lb

## 2019-10-20 DIAGNOSIS — I5032 Chronic diastolic (congestive) heart failure: Secondary | ICD-10-CM

## 2019-10-20 DIAGNOSIS — E1121 Type 2 diabetes mellitus with diabetic nephropathy: Secondary | ICD-10-CM | POA: Diagnosis not present

## 2019-10-20 DIAGNOSIS — N1832 Chronic kidney disease, stage 3b: Secondary | ICD-10-CM | POA: Diagnosis not present

## 2019-10-20 DIAGNOSIS — I10 Essential (primary) hypertension: Secondary | ICD-10-CM

## 2019-10-20 LAB — POCT GLYCOSYLATED HEMOGLOBIN (HGB A1C)
Est. average glucose Bld gHb Est-mCnc: 137
Hemoglobin A1C: 6.4 % — AB (ref 4.0–5.6)

## 2019-10-20 NOTE — Chronic Care Management (AMB) (Signed)
  Chronic Care Management   Note  10/20/2019 Name: Jacob Herrera MRN: [id number] DOB: 02-Dec-1950  [name] is a 69 y.o. year old male who is a primary care patient of Caryn Section, [name], MD. I reached out to [name] by phone today in response to a referral sent by [name]'s PCP, [name], MD.  [name] was given information about Chronic Care Management services today including:  1. CCM service includes personalized support from designated clinical staff supervised by his physician, including individualized plan of care and coordination with other care providers 2. 24/7 contact phone numbers for assistance for urgent and routine care needs. 3. Service will only be billed when office clinical staff spend 20 minutes or more in a month to coordinate care. 4. Only one practitioner may furnish and bill the service in a calendar month. 5. The patient may stop CCM services at any time (effective at the end of the month) by phone call to the office staff. 6. The patient will be responsible for cost sharing (co-pay) of up to 20% of the service fee (after annual deductible is met).  Patient agreed to services and verbal consent obtained.   Follow up plan: Telephone appointment with care management team member scheduled for: 11/17/2019  [facility]  [place], [place] [address] Direct Dial: [email or web address]: [email or web address]

## 2019-10-20 NOTE — Patient Instructions (Addendum)
.   Please review the attached list of medications and notify my office if there are any errors.   . Please bring all of your medications to every appointment so we can make sure that our medication list is the same as yours.    You can take cetirizine for your allergies, which is the generic version of Zyrtec.    You are due for a colonoscopy. Please call [facility] at [phone] to schedule as soon as possible

## 2019-10-22 ENCOUNTER — Telehealth: Payer: Self-pay | Admitting: Internal Medicine

## 2019-10-22 NOTE — Telephone Encounter (Signed)
Jacob Herrera, can you help with this?  Per our records, it appears that pt is on night time oxygen only.

## 2019-10-22 NOTE — Telephone Encounter (Signed)
CM sent to Darlina Guys, Alyse Low and Skeet Latch with [employer] to inquire as to what [employer] may need. Pt was seen in Nov 2020. Rhonda J Cobb

## 2019-10-23 NOTE — Telephone Encounter (Signed)
Pt has scheduled an appointment with Dr. Mortimer Fries for 01/21/2020 at 1:30 pm. Pt is aware of appointment. ONO on RA will need to be ordered at the time of his OV. Rhonda J Cobb

## 2019-10-23 NOTE — Telephone Encounter (Signed)
I called patient to schedule a sooner appointment with an APP. Pt refused and said that he will not be seen any sooner than his appointment with Dr. Mortimer Fries which is 01/21/20. [place]

## 2019-10-23 NOTE — Telephone Encounter (Signed)
Per Skeet Latch with [employer]- Pt needs an OV with ONO in order to replaced pt's 02 equipment.   See message from [place] below. Yes ma'am. He needs an new OV with his provider, a new 6 minute walk test.         The patient is due for a 80months replacement for his oxygen. We need all of the necessary documents to show that the patient is still in need of oxygen so we can replace and give him newer equipment.    No ma'am 60 month replacement. The patient has had his equipment for 60 months and now it's time to replace it completely. It's slightly different from a recertification, that is only once a year.     Phone message sent to [facility] to contact patient to arrange appointment. Rhonda J Cobb

## 2019-10-27 ENCOUNTER — Other Ambulatory Visit: Payer: Self-pay | Admitting: Family Medicine

## 2019-11-03 ENCOUNTER — Other Ambulatory Visit: Payer: Self-pay | Admitting: Urology

## 2019-11-03 DIAGNOSIS — N401 Enlarged prostate with lower urinary tract symptoms: Secondary | ICD-10-CM

## 2019-11-17 ENCOUNTER — Ambulatory Visit (INDEPENDENT_AMBULATORY_CARE_PROVIDER_SITE_OTHER): Payer: PPO | Admitting: Pharmacist

## 2019-11-17 ENCOUNTER — Other Ambulatory Visit: Payer: Self-pay

## 2019-11-17 DIAGNOSIS — D2262 Melanocytic nevi of left upper limb, including shoulder: Secondary | ICD-10-CM | POA: Diagnosis not present

## 2019-11-17 DIAGNOSIS — X32XXXA Exposure to sunlight, initial encounter: Secondary | ICD-10-CM | POA: Diagnosis not present

## 2019-11-17 DIAGNOSIS — D225 Melanocytic nevi of trunk: Secondary | ICD-10-CM | POA: Diagnosis not present

## 2019-11-17 DIAGNOSIS — C44619 Basal cell carcinoma of skin of left upper limb, including shoulder: Secondary | ICD-10-CM | POA: Diagnosis not present

## 2019-11-17 DIAGNOSIS — D2261 Melanocytic nevi of right upper limb, including shoulder: Secondary | ICD-10-CM | POA: Diagnosis not present

## 2019-11-17 DIAGNOSIS — L570 Actinic keratosis: Secondary | ICD-10-CM | POA: Diagnosis not present

## 2019-11-17 DIAGNOSIS — D485 Neoplasm of uncertain behavior of skin: Secondary | ICD-10-CM | POA: Diagnosis not present

## 2019-11-17 DIAGNOSIS — Z85820 Personal history of malignant melanoma of skin: Secondary | ICD-10-CM | POA: Diagnosis not present

## 2019-11-17 DIAGNOSIS — L400 Psoriasis vulgaris: Secondary | ICD-10-CM | POA: Diagnosis not present

## 2019-11-17 DIAGNOSIS — E1121 Type 2 diabetes mellitus with diabetic nephropathy: Secondary | ICD-10-CM

## 2019-11-17 DIAGNOSIS — Z85828 Personal history of other malignant neoplasm of skin: Secondary | ICD-10-CM | POA: Diagnosis not present

## 2019-11-17 DIAGNOSIS — E785 Hyperlipidemia, unspecified: Secondary | ICD-10-CM

## 2019-11-17 NOTE — Chronic Care Management (AMB) (Signed)
Chronic Care Management Pharmacy  Name: Jacob Herrera  MRN: [id number] DOB: 1950/10/08  Chief Complaint/ HPI  Jacob Herrera,  68 y.o. , male presents for their Initial CCM visit with the clinical pharmacist via telephone due to COVID-19 Pandemic.  PCP : Jacob Sons, MD  Their chronic conditions include: HF, HTN, HLD, DM  Office Visits: 6/28 DM, [name], BP 158/70 P 69 Wt 269 BMI 40.9, can't afford Ozempic and Basaglar, salty diet  Consult Visit: 4/12 CKD, [place], BP 135/63 P 68 Wt 274 BMI 41.7,   Medications: Outpatient Encounter Medications as of 11/17/2019  Medication Sig  . albuterol (PROVENTIL) (2.5 MG/3ML) 0.083% nebulizer solution Take 3 mLs (2.5 mg total) by nebulization every 4 (four) hours. And PRN (Patient taking differently: Take 2.5 mg by nebulization as needed. )  . amLODipine (NORVASC) 10 MG tablet Take 1 tablet (10 mg total) by mouth daily.  [name] Kitchen aspirin 81 MG tablet Take 81 mg by mouth daily.  [name] Kitchen atorvastatin (LIPITOR) 80 MG tablet Take 1 tablet (80 mg total) by mouth at bedtime.  . budesonide-formoterol (SYMBICORT) 80-4.5 MCG/ACT inhaler Inhale 2 puffs into the lungs 2 (two) times daily. (Patient taking differently: Inhale 2 puffs into the lungs 2 (two) times daily. Uses about every 3 days)  . carvedilol (COREG) 25 MG tablet TAKE 1 TABLET BY MOUTH TWICE DAILY  . Cholecalciferol (VITAMIN D3 PO) Take 1,000 Units by mouth daily.   . cloNIDine (CATAPRES) 0.2 MG tablet TAKE ONE TABLET BY MOUTH TWICE DAILY  . clopidogrel (PLAVIX) 75 MG tablet Take 1 tablet (75 mg total) by mouth daily.  . Cyanocobalamin (VITAMIN B 12 PO) Take 1,000 mcg by mouth daily.   [name] Kitchen doxazosin (CARDURA) 8 MG tablet Take 1 tablet (8 mg total) by mouth daily.  . finasteride (PROSCAR) 5 MG tablet TAKE ONE TABLET EVERY DAY  . fluticasone (FLONASE) 50 MCG/ACT nasal spray Place 2 sprays into both nostrils daily. (Patient taking differently: Place 2 sprays into both nostrils as needed. About every  other day)  . furosemide (LASIX) 40 MG tablet Take 40 mg by mouth 2 (two) times daily.   [name] Kitchen gabapentin (NEURONTIN) 300 MG capsule TAKE 1 CAPSULE BY MOUTH AT BEDTIME FOR 1WEEK, THEN INCREASE TO 1 CAPSULE TWICE DAILY AS DIRECTED (Patient taking differently: Take 300 mg by mouth 2 (two) times daily. )  . glucose blood (ONETOUCH ULTRA) test strip USE ONE STRIP THREE TIMES DAILY FOR INSULIN DEPENDENT DEPENDENT TYPE 2 DIABETES  . hydrALAZINE (APRESOLINE) 100 MG tablet TAKE ONE TABLET BY MOUTH 3 TIMES DAILY  . insulin aspart (NOVOLOG FLEXPEN) 100 UNIT/ML FlexPen Take 16 units before lunch.  . Insulin Glargine (LANTUS SOLOSTAR) 100 UNIT/ML Solostar Pen TITRATE UP TO 80 UNITS, ONCE A DAY  . Insulin Pen Needle (BD PEN NEEDLE NANO U/F) 32G X 4 MM MISC USE DAILY AS DIRECTED  . isosorbide mononitrate (IMDUR) 60 MG 24 hr tablet Take 1 tablet (60 mg total) by mouth daily.  [name] Kitchen levothyroxine (SYNTHROID) 125 MCG tablet TAKE ONE TABLET BY MOUTH EVERY DAY  . losartan (COZAAR) 100 MG tablet Take 1 tablet (100 mg total) by mouth daily.  [name] Kitchen Lysine 500 MG CAPS Take 1 capsule by mouth daily.  [name] Kitchen oxyCODONE (OXY IR/ROXICODONE) 5 MG immediate release tablet Take 1 tablet (5 mg total) by mouth daily as needed for severe pain. (Patient taking differently: Take 5 mg by mouth daily as needed for severe pain. Rarely)  . OZEMPIC, 1 MG/DOSE, 2 MG/1.5ML  SOPN INJECT 1MG INTO THE SKIN ONCE A WEEK  . SENNA PO Take 1 capsule by mouth in the morning, at noon, in the evening, and at bedtime.   No facility-administered encounter medications on file as of 11/17/2019.      Financial Resource Strain: High Risk  . Difficulty of Paying Living Expenses: Hard    Current Diagnosis/Assessment:  Goals Addressed            This Visit's Progress   . Chronic Care Management       CARE PLAN ENTRY (see longitudinal plan of care for additional care plan information)  Current Barriers:  . Chronic Disease Management support, education, and care  coordination needs related to Hypertension, Hyperlipidemia, Diabetes, and Heart Failure   Hypertension BP Readings from Last 3 Encounters:  10/20/19 (!) 158/70  05/26/19 132/66  01/21/19 (!) 128/56   . Pharmacist Clinical Goal(s): o Over the next 90 days, patient will work with PharmD and providers to maintain BP goal <130/80 . Current regimen:  . Imdur 23m daily . Losartan 1060mdaily . Hydralazine 10071mhree times daily . Doxazosin 8mg75mily . Clonidine 0.2mg 64mce daily . Carvedilol 25mg 71me daily . Amlodipine 10mg d1m . Interventions: o None . Patient self care activities - Over the next 90 days, patient will: o Check BP daily, document, and provide at future appointments o Ensure daily salt intake < 2300 mg/day  Hyperlipidemia Lab Results  Component Value Date/Time   LDLCALC 46 05/26/2019 04:16 PM   [place]CAL[place]27/2018 03:03 PM   . Pharmacist Clinical Goal(s): o Over the next 90 days, patient will work with PharmD and providers to maintain LDL goal < 70 . Current regimen:  o Lipitor 80mg da56m. Interventions: o None . Patient self care activities - Over the next 90 days, patient will: o Continue current lifestyle modifications  Diabetes Lab Results  Component Value Date/Time   HGBA1C 6.4 (A) 10/20/2019 03:10 PM   HGBA1C 6.2 (A) 05/26/2019 03:53 PM   HGBA1C 6.3 (H) 03/20/2017 03:03 PM   HGBA1C 8.4 (A) 09/08/2014 12:00 AM   . Pharmacist Clinical Goal(s): o Over the next 90 days, patient will work with PharmD and providers to maintain A1c goal <7% . Current regimen:  o Novolog 5 - 12 units sliding scale o Lantus inject 35 units if blood sugar < 200 or 45 units if > 200 o Ozempic 1mg inje31mweekly . Interventions: o Patient Assistance Program for Ozempic a[employer] Farmt self care activities - Over the next 90 days, patient will: o Check blood sugar once daily, document, and provide at future appointments  Medication management . Pharmacist  Clinical Goal(s): o Over the next 90 days, patient will work with PharmD and providers to maintain optimal medication adherence . Current pharmacy: [facility] . Interventions o Comprehensive medication review performed. o Continue current medication management strategy . Patient self care activities - Over the next 90 days, patient will: o Focus on medication adherence by providing financial documents for patient assistance program o Take medications as prescribed o Report any questions or concerns to PharmD and/or provider(s)  Initial goal documentation       Diabetes   Recent Relevant Labs: Lab Results  Component Value Date/Time   HGBA1C 6.4 (A) 10/20/2019 03:10 PM   HGBA1C 6.2 (A) 05/26/2019 03:53 PM   HGBA1C 6.3 (H) 03/20/2017 03:03 PM   HGBA1C 8.4 (A) 09/08/2014 12:00 AM   MICROALBUR neg 03/20/2017 02:25 PM  Kidney Function Lab Results  Component Value Date/Time   CREATININE 1.85 (H) 10/07/2018 08:25 AM   CREATININE 1.53 (H) 12/21/2016 03:27 PM   CREATININE 1.36 (H) 07/08/2013 05:55 AM   CREATININE 1.44 (H) 07/04/2013 12:26 PM   GFRNONAA 36 (L) 10/07/2018 08:25 AM   GFRNONAA 54 (L) 07/08/2013 05:55 AM   GFRAA 42 (L) 10/07/2018 08:25 AM   GFRAA >60 07/08/2013 05:55 AM   K 3.8 10/07/2018 08:25 AM   K 4.7 12/21/2016 03:27 PM   K 4.0 07/08/2013 05:55 AM   K 4.6 07/04/2013 12:26 PM    Checking BG: Rarely  Patient is currently controlled on the following medications: Ozempic, Novolog, Lantus  Last diabetic Foot exam:  Lab Results  Component Value Date/Time   HMDIABEYEEXA No Retinopathy 03/27/2019 12:00 AM    Last diabetic Eye exam: No results found for: HMDIABFOOTEX   We discussed:  Neurontin 376m bid, ask [name] for 6079mbid Novolog 5-12 units as needed glargine 35 if under 200 45 if over  Lunch at 3 - 4 pm   Plan  Recommend increase gabapentin 60077mid  Heart Failure   Type: Diastolic  Patient has failed these meds in past:  NA Patient is currently controlled on the following medications: furosemide 10m18mily  We discussed: Edema well controlled  Plan  Continue current medications  Hypertension   BP goal is:  <130/80  Office blood pressures are  BP Readings from Last 3 Encounters:  10/20/19 (!) 158/70  05/26/19 132/66  01/21/19 (!) 128/56   Patient checks BP at home infrequently Patient home BP readings are ranging: NA  Patient has failed these meds in the past: clonidine 0.3mg 57mient is currently controlled on the following medications:  . Imdur 60mg 73my . Losartan 100mg d55m . Hydralazine 100mg ti26mDoxazosin 8mg dail23m Clonidine 0.2mg bid .69mrvedilol 25mg bid .69modipine 10mg daily 75mdiscussed: Mostly controlled but was over controlled in the past and doesn't want that.  Plan  Continue current medications   Hyperlipidemia   LDL goal < 70  Lipid Panel     Component Value Date/Time   CHOL 119 05/26/2019 1616   TRIG 252 (H) 05/26/2019 1616   HDL 33 (L) 05/26/2019 1616   LDLCALC 46 05/26/2019 1616   LDLCALC 83 03/20/2017 1503    Hepatic Function Latest Ref Rng & Units 10/07/2018 12/21/2016 07/04/2013  Total Protein 6.0 - 8.5 g/dL 6.6 7.0 7.5  Albumin 3.8 - 4.8 g/dL 4.2 4.3 3.3(L)  AST 0 - 40 IU/L 23 19 32  ALT 0 - 44 IU/L 17 21 34  Alk Phosphatase 39 - 117 IU/L 47 68 53  Total Bilirubin 0.0 - 1.2 mg/dL 0.2 0.2 0.4     The ASCVD Risk score ([place]off DC Jr.,[id number]) failed to calculate for the following reasons:   The valid total cholesterol range is 130 to 320 mg/dL   Patient has failed these meds in past: NA Patient is currently controlled on the following medications:  . Lipitor 10mg daily  35miscussed:   At goal Denies myalgias  Plan  Continue current medications  Medication Management   Pt uses [facility] for all medications Uses pill box? Yes Pt endorses 100% compliance Over[place] happy with [facility] after years of [facility]  failure Gets 90 day supply for same price as mail order   We discussed:  Qualifies for LIS? No Covid 06/17/19, 07/08/19 Pfizer  Plan [place]  current medication management strategy  Follow up: 3 month phone visit  Milus Height, PharmD, [place], [place] [phone]

## 2019-11-19 ENCOUNTER — Ambulatory Visit: Payer: PPO | Admitting: Urology

## 2019-11-19 NOTE — Patient Instructions (Addendum)
Visit Information  Goals Addressed            This Visit's Progress   . Chronic Care Management       CARE PLAN ENTRY (see longitudinal plan of care for additional care plan information)  Current Barriers:  . Chronic Disease Management support, education, and care coordination needs related to Hypertension, Hyperlipidemia, Diabetes, and Heart Failure   Hypertension BP Readings from Last 3 Encounters:  10/20/19 (!) 158/70  05/26/19 132/66  01/21/19 (!) 128/56   . Pharmacist Clinical Goal(s): o Over the next 90 days, patient will work with PharmD and providers to maintain BP goal <130/80 . Current regimen:  . Imdur 60mg  daily . Losartan 100mg  daily . Hydralazine 100mg  three times daily . Doxazosin 8mg  daily . Clonidine 0.2mg  twice daily . Carvedilol 25mg  twice daily . Amlodipine 10mg  daily . Interventions: o None . Patient self care activities - Over the next 90 days, patient will: o Check BP daily, document, and provide at future appointments o Ensure daily salt intake < 2300 mg/day  Hyperlipidemia Lab Results  Component Value Date/Time   LDLCALC 46 05/26/2019 04:16 PM   [place] 83 03/20/2017 03:03 PM   . Pharmacist Clinical Goal(s): o Over the next 90 days, patient will work with PharmD and providers to maintain LDL goal < 70 . Current regimen:  o Lipitor 80mg  daily . Interventions: o None . Patient self care activities - Over the next 90 days, patient will: o Continue current lifestyle modifications  Diabetes Lab Results  Component Value Date/Time   HGBA1C 6.4 (A) 10/20/2019 03:10 PM   HGBA1C 6.2 (A) 05/26/2019 03:53 PM   HGBA1C 6.3 (H) 03/20/2017 03:03 PM   HGBA1C 8.4 (A) 09/08/2014 12:00 AM   . Pharmacist Clinical Goal(s): o Over the next 90 days, patient will work with PharmD and providers to maintain A1c goal <7% . Current regimen:  o Novolog 5 - 12 units sliding scale o Lantus inject 35 units if blood sugar < 200 or 45 units if > 200 o Ozempic  1mg  inject weekly . Interventions: o Patient Assistance Program for [employer] . Patient self care activities - Over the next 90 days, patient will: o Check blood sugar once daily, document, and provide at future appointments  Medication management . Pharmacist Clinical Goal(s): o Over the next 90 days, patient will work with PharmD and providers to maintain optimal medication adherence . Current pharmacy: [facility] . Interventions o Comprehensive medication review performed. o Continue current medication management strategy . Patient self care activities - Over the next 90 days, patient will: o Focus on medication adherence by providing financial documents for patient assistance program o Take medications as prescribed o Report any questions or concerns to PharmD and/or provider(s)  Initial goal documentation        [name] was given information about Chronic Care Management services today including:  1. CCM service includes personalized support from designated clinical staff supervised by his physician, including individualized plan of care and coordination with other care providers 2. 24/7 contact phone numbers for assistance for urgent and routine care needs. 3. Standard insurance, coinsurance, copays and deductibles apply for chronic care management only during months in which we provide at least 20 minutes of these services. Most insurances cover these services at 100%, however patients may be responsible for any copay, coinsurance and/or deductible if applicable. This service may help you avoid the need for more expensive face-to-face services. 4. Only one  practitioner may furnish and bill the service in a calendar month. 5. The patient may stop CCM services at any time (effective at the end of the month) by phone call to the office staff.  Patient agreed to services and verbal consent obtained.   Print copy of patient instructions provided.  Telephone  follow up appointment with pharmacy team member scheduled for: 3 months  Jacob Herrera, PharmD, [place], [place] [phone]   Diabetic Neuropathy Diabetic neuropathy refers to nerve damage that is caused by diabetes (diabetes mellitus). Over time, people with diabetes can develop nerve damage throughout the body. There are several types of diabetic neuropathy:  Peripheral neuropathy. This is the most common type of diabetic neuropathy. It causes damage to nerves that carry signals between the spinal cord and other parts of the body (peripheral nerves). This usually affects nerves in the feet and legs first, and may eventually affect the hands and arms. The damage affects the ability to sense touch or temperature.  Autonomic neuropathy. This type causes damage to nerves that control involuntary functions (autonomic nerves). These nerves carry signals that control: ? Heartbeat. ? Body temperature. ? Blood pressure. ? Urination. ? Digestion. ? Sweating. ? Sexual function. ? Response to changing blood sugar (glucose) levels.  Focal neuropathy. This type of nerve damage affects one area of the body, such as an arm, a leg, or the face. The injury may involve one nerve or a small group of nerves. Focal neuropathy can be painful and unpredictable, and occurs most often in older adults with diabetes. This often develops suddenly, but usually improves over time and does not cause long-term problems.  Proximal neuropathy. This type of nerve damage affects the nerves of the thighs, hips, buttocks, or legs. It causes severe pain, weakness, and muscle death (atrophy), usually in the thigh muscles. It is more common among older men and people who have type 2 diabetes. The length of recovery time may vary. What are the causes? Peripheral, autonomic, and focal neuropathies are caused by diabetes that is not well controlled with treatment. The cause of proximal  neuropathy is not known, but it may be caused by inflammation related to uncontrolled blood glucose levels. What are the signs or symptoms? Peripheral neuropathy Peripheral neuropathy develops slowly over time. When the nerves of the feet and legs no longer work, you may experience:  Burning, stabbing, or aching pain in the legs or feet.  Pain or cramping in the legs or feet.  Loss of feeling (numbness) and inability to feel pressure or pain in the feet. This can lead to: ? Thick calluses or sores on areas of constant pressure. ? Ulcers. ? Reduced ability to feel temperature changes.  Foot deformities.  Muscle weakness.  Loss of balance or coordination. Autonomic neuropathy The symptoms of autonomic neuropathy vary depending on which nerves are affected. Symptoms may include:  Problems with digestion, such as: ? Nausea or vomiting. ? Poor appetite. ? Bloating. ? Diarrhea or constipation. ? Trouble swallowing. ? Losing weight without trying to.  Problems with the heart, blood and lungs, such as: ? Dizziness, especially when standing up. ? Fainting. ? Shortness of breath. ? Irregular heartbeat.  Bladder problems, such as: ? Trouble starting or stopping urination. ? Leaking urine. ? Trouble emptying the bladder. ? Urinary tract infections (UTIs).  Problems with other body functions, such as: ? Sweat. You may sweat too much or too little. ? Temperature. You might get hot easily. Or, you  might feel cold more than usual. ? Sexual function. Men may not be able to get or maintain an erection. Women may have vaginal dryness and difficulty with arousal. Focal neuropathy Symptoms affect only one area of the body. Common symptoms include:  Numbness.  Tingling.  Burning pain.  Prickling feeling.  Very sensitive skin.  Weakness.  Inability to move (paralysis).  Muscle twitching.  Muscles getting smaller (wasting).  Poor coordination.  Double or blurred  vision. Proximal neuropathy  Sudden, severe pain in the hip, thigh, or buttocks. Pain may spread from the back into the legs (sciatica).  Pain and numbness in the arms and legs.  Tingling.  Loss of bladder control or bowel control.  Weakness and wasting of thigh muscles.  Difficulty getting up from a seated position.  Abdominal swelling.  Unexplained weight loss. How is this diagnosed? Diagnosis usually involves reviewing your medical history and any symptoms you have. Diagnosis varies depending on the type of neuropathy your health care provider suspects. Peripheral neuropathy Your health care provider will check areas that are affected by your nervous system (neurologic exam), such as your reflexes, how you move, and what you can feel. You may have other tests, such as:  Blood tests.  Removal and examination of fluid that surrounds the spinal cord (lumbar puncture).  CT scan.  MRI.  A test to check the nerves that control muscles (electromyogram, EMG).  Tests of how quickly messages pass through your nerves (nerve conduction velocity tests).  Removal of a small piece of nerve to be examined under a microscope (biopsy). Autonomic neuropathy You may have tests, such as:  Tests to measure your blood pressure and heart rate. This may include monitoring you while you are safely secured to an exam table that moves you from a lying position to an upright position (table tilt test).  Breathing tests to check your lungs.  Tests to check how food moves through the digestive system (gastric emptying tests).  Blood, sweat, or urine tests.  Ultrasound of your bladder.  Spinal fluid tests. Focal neuropathy This condition may be diagnosed with:  A neurologic exam.  CT scan.  MRI.  EMG.  Nerve conduction velocity tests. Proximal neuropathy There is no test to diagnose this type of neuropathy. You may have tests to rule out other possible causes of this type of  neuropathy. Tests may include:  X-rays of your spine and lumbar region.  Lumbar puncture.  MRI. How is this treated? The goal of treatment is to keep nerve damage from getting worse. The most important part of treatment is keeping your blood glucose level and your A1C level within your target range by following your diabetes management plan. Over time, maintaining lower blood glucose levels helps lessen symptoms. In some cases, you may need prescription pain medicine. Follow these instructions at home:  Lifestyle   Do not use any products that contain nicotine or tobacco, such as cigarettes and e-cigarettes. If you need help quitting, ask your health care provider.  Be physically active every day. Include strength training and balance exercises.  Follow a healthy meal plan.  Work with your health care provider to manage your blood pressure. General instructions  Follow your diabetes management plan as directed. ? Check your blood glucose levels as directed by your health care provider. ? Keep your blood glucose in your target range as directed by your health care provider. ? Have your A1C level checked at least two times a year, or as often  as told by your health care provider.  Take over the counter and prescription medicines only as told by your health care provider. This includes insulin and diabetes medicine.  Do not drive or use heavy machinery while taking prescription pain medicines.  Check your skin and feet every day for cuts, bruises, redness, blisters, or sores.  Keep all follow up visits as told by your health care provider. This is important. Contact a health care provider if:  You have burning, stabbing, or aching pain in your legs or feet.  You are unable to feel pressure or pain in your feet.  You develop problems with digestion, such as: ? Nausea. ? Vomiting. ? Bloating. ? Constipation. ? Diarrhea. ? Abdominal pain.  You have difficulty with  urination, such as inability: ? To control when you urinate (incontinence). ? To completely empty the bladder (retention).  You have palpitations.  You feel dizzy, weak, or faint when you stand up. Get help right away if:  You cannot urinate.  You have sudden weakness or loss of coordination.  You have trouble speaking.  You have pain or pressure in your chest.  You have an irregular heart beat.  You have sudden inability to move a part of your body. Summary  Diabetic neuropathy refers to nerve damage that is caused by diabetes. It can affect nerves throughout the entire body, causing numbness and pain in the arms, legs, digestive tract, heart, and other body systems.  Keep your blood glucose level and your blood pressure in your target range, as directed by your health care provider. This can help prevent neuropathy from getting worse.  Check your skin and feet every day for cuts, bruises, redness, blisters, or sores.  Do not use any products that contain nicotine or tobacco, such as cigarettes and e-cigarettes. If you need help quitting, ask your health care provider. This information is not intended to replace advice given to you by your health care provider. Make sure you discuss any questions you have with your health care provider. Document Revised: 05/23/2017 Document Reviewed: 05/15/2016 Elsevier Patient Education  2020 [employer].

## 2019-11-20 ENCOUNTER — Other Ambulatory Visit: Payer: Self-pay

## 2019-11-20 ENCOUNTER — Encounter: Payer: Self-pay | Admitting: Urology

## 2019-11-20 ENCOUNTER — Other Ambulatory Visit: Payer: Self-pay | Admitting: Family Medicine

## 2019-11-20 ENCOUNTER — Ambulatory Visit (INDEPENDENT_AMBULATORY_CARE_PROVIDER_SITE_OTHER): Payer: PPO | Admitting: Urology

## 2019-11-20 VITALS — BP 142/80 | HR 78 | Ht 71.0 in | Wt 260.0 lb

## 2019-11-20 DIAGNOSIS — G629 Polyneuropathy, unspecified: Secondary | ICD-10-CM

## 2019-11-20 DIAGNOSIS — N401 Enlarged prostate with lower urinary tract symptoms: Secondary | ICD-10-CM

## 2019-11-20 LAB — BLADDER SCAN AMB NON-IMAGING: Scan Result: 0

## 2019-11-20 MED ORDER — GABAPENTIN 600 MG PO TABS: 600.0000 mg | ORAL_TABLET | Freq: Two times a day (BID) | ORAL | 3 refills | Status: DC

## 2019-11-23 ENCOUNTER — Encounter: Payer: Self-pay | Admitting: Urology

## 2019-11-23 NOTE — Progress Notes (Signed)
11/20/2019 12:15 PM   Jacob Herrera Jan 28, 1951 [id number]  Referring provider: [name], MD [address] [place],  [place] [address]  Chief Complaint  Patient presents with  . Benign Prostatic Hypertrophy    Urologic history: 1.  BPH with lower urinary tract symptoms -Combination therapy doxazosin 8 mg/finasteride   HPI: 69 y.o. male presents for annual follow-up of BPH.   No significant voiding problems over the last year  Notes urinary frequency secondary to Lasix  Occasional urinary urgency  No nocturia  First morning void with decreased force and caliber of stream  Denies dysuria, gross hematuria  Denies flank, abdominal or pelvic pain   PMH: Past Medical History:  Diagnosis Date  . History of chicken pox   . Hx of skin cancer, basal cell   . Kidney disease   . Scoliosis   . Thyrotoxicosis    2010    Surgical History: Past Surgical History:  Procedure Laterality Date  . CARDIAC CATHETERIZATION N/A 03/31/2015   Procedure: Left Heart Cath and Coronary Angiography;  Surgeon: Teodoro Spray, MD;  Location: [place] [facility];  Service: Cardiovascular;  Laterality: N/A;  . CARDIAC CATHETERIZATION N/A 03/31/2015   Procedure: Coronary Stent Intervention;  Surgeon: Teodoro Spray, MD;  Location: [place] [facility];  Service: Cardiovascular;  Laterality: N/A;  . CARDIAC CATHETERIZATION N/A 03/31/2015   Procedure: Coronary Stent Intervention;  Surgeon: Yolonda Kida, MD;  Location: [place] [facility];  Service: Cardiovascular;  Laterality: N/A;  . MOHS SURGERY Left 05/12/2019   Left nares by [name], MD [place]  . NASAL SINUS SURGERY  2005  . TONSILLECTOMY AND ADENOIDECTOMY  1950    Home Medications:  Allergies as of 11/20/2019      Reactions   Chlorthalidone    D/c by dr. Juleen China for worsening renal function 03/2019   Spironolactone    gynecomastia      Medication List       Accurate as of November 20, 2019 11:59 PM. If you have any questions, ask your nurse or doctor.        STOP taking these medications   gabapentin 300 MG capsule Commonly known as: NEURONTIN Replaced by: gabapentin 600 MG tablet Stopped by: Lelon Huh, MD     TAKE these medications   albuterol (2.5 MG/3ML) 0.083% nebulizer solution Commonly known as: PROVENTIL Take 3 mLs (2.5 mg total) by nebulization every 4 (four) hours. And PRN What changed:   when to take this  reasons to take this  additional instructions   amLODipine 10 MG tablet Commonly known as: NORVASC Take 1 tablet (10 mg total) by mouth daily.   aspirin 81 MG tablet Take 81 mg by mouth daily.   atorvastatin 80 MG tablet Commonly known as: LIPITOR Take 1 tablet (80 mg total) by mouth at bedtime.   BD Pen Needle Nano U/F 32G X 4 MM Misc Generic drug: Insulin Pen Needle USE DAILY AS DIRECTED   budesonide-formoterol 80-4.5 MCG/ACT inhaler Commonly known as: Symbicort Inhale 2 puffs into the lungs 2 (two) times daily. What changed: additional instructions   carvedilol 25 MG tablet Commonly known as: COREG TAKE 1 TABLET BY MOUTH TWICE DAILY   cloNIDine 0.2 MG tablet Commonly known as: CATAPRES TAKE ONE TABLET BY MOUTH TWICE DAILY   clopidogrel 75 MG tablet Commonly known as: Plavix Take 1 tablet (75 mg total) by mouth daily.   doxazosin 8 MG tablet Commonly  known as: CARDURA Take 1 tablet (8 mg total) by mouth daily.   finasteride 5 MG tablet Commonly known as: PROSCAR TAKE ONE TABLET EVERY DAY   fluticasone 50 MCG/ACT nasal spray Commonly known as: FLONASE Place 2 sprays into both nostrils daily. What changed:   when to take this  reasons to take this  additional instructions   furosemide 40 MG tablet Commonly known as: LASIX Take 40 mg by mouth 2 (two) times daily.   gabapentin 600 MG tablet Commonly known as: Neurontin Take 1 tablet (600 mg total) by mouth 2 (two) times daily. Replaces: gabapentin 300  MG capsule Started by: Lelon Huh, MD   hydrALAZINE 100 MG tablet Commonly known as: APRESOLINE TAKE ONE TABLET BY MOUTH 3 TIMES DAILY   isosorbide mononitrate 60 MG 24 hr tablet Commonly known as: IMDUR Take 1 tablet (60 mg total) by mouth daily.   Lantus SoloStar 100 UNIT/ML Solostar Pen Generic drug: insulin glargine TITRATE UP TO 80 UNITS, ONCE A DAY   levothyroxine 125 MCG tablet Commonly known as: SYNTHROID TAKE ONE TABLET BY MOUTH EVERY DAY   losartan 100 MG tablet Commonly known as: COZAAR Take 1 tablet (100 mg total) by mouth daily.   Lysine 500 MG Caps Take 1 capsule by mouth daily.   NovoLOG FlexPen 100 UNIT/ML FlexPen Generic drug: insulin aspart Take 16 units before lunch.   OneTouch Ultra test strip Generic drug: glucose blood USE ONE STRIP THREE TIMES DAILY FOR INSULIN DEPENDENT DEPENDENT TYPE 2 DIABETES   oxyCODONE 5 MG immediate release tablet Commonly known as: Oxy IR/ROXICODONE Take 1 tablet (5 mg total) by mouth daily as needed for severe pain. What changed: additional instructions   Ozempic (1 MG/DOSE) 2 MG/1.5ML Sopn Generic drug: Semaglutide (1 MG/DOSE) INJECT 1MG  INTO THE SKIN ONCE A WEEK   SENNA PO Take 1 capsule by mouth in the morning, at noon, in the evening, and at bedtime.   VITAMIN B 12 PO Take 1,000 mcg by mouth daily.   VITAMIN D3 PO Take 1,000 Units by mouth daily.       Allergies:  Allergies  Allergen Reactions  . Chlorthalidone     D/c by dr. Juleen China for worsening renal function 03/2019  . Spironolactone     gynecomastia    Family History: Family History  Problem Relation Age of Onset  . Emphysema Father   . Cancer - Lung Father   . Cancer - Ovarian Mother        74's  . Breast cancer Mother        late 55's    Social History:  reports that he quit smoking about 31 years ago. His smoking use included cigarettes. He has a 25.00 pack-year smoking history. His smokeless tobacco use includes chew. He reports  that he does not drink alcohol and does not use drugs.   Physical Exam: BP (!) 142/80   Pulse 78   Ht 5\' 11"  (1.803 m)   Wt (!) 260 lb (117.9 kg)   BMI 36.26 kg/m   Constitutional:  Alert and oriented, No acute distress. HEENT: [place] AT, moist mucus membranes.  Trachea midline, no masses. Cardiovascular: No clubbing, cyanosis, or edema. Respiratory: Normal respiratory effort, no increased work of breathing. Neurologic: Grossly intact, no focal deficits, moving all 4 extremities. Psychiatric: Normal mood and affect.   Assessment & Plan:    1. Benign prostatic hyperplasia with LUTS  Stable voiding symptoms on combination therapy  Did not need finasteride refilled  Dr. Caryn Section has prescribed doxazosin  PVR by bladder scan 0 mL  Continue annual follow-up   [name], MD  [place] [address] [place], [place] [address] [phone]

## 2019-12-16 DIAGNOSIS — G4733 Obstructive sleep apnea (adult) (pediatric): Secondary | ICD-10-CM | POA: Diagnosis not present

## 2019-12-16 DIAGNOSIS — J45909 Unspecified asthma, uncomplicated: Secondary | ICD-10-CM | POA: Diagnosis not present

## 2019-12-18 DIAGNOSIS — G4733 Obstructive sleep apnea (adult) (pediatric): Secondary | ICD-10-CM | POA: Diagnosis not present

## 2019-12-18 DIAGNOSIS — I10 Essential (primary) hypertension: Secondary | ICD-10-CM | POA: Diagnosis not present

## 2019-12-18 DIAGNOSIS — I5032 Chronic diastolic (congestive) heart failure: Secondary | ICD-10-CM | POA: Diagnosis not present

## 2019-12-18 DIAGNOSIS — I25110 Atherosclerotic heart disease of native coronary artery with unstable angina pectoris: Secondary | ICD-10-CM | POA: Diagnosis not present

## 2019-12-18 DIAGNOSIS — E1121 Type 2 diabetes mellitus with diabetic nephropathy: Secondary | ICD-10-CM | POA: Diagnosis not present

## 2019-12-18 DIAGNOSIS — Z794 Long term (current) use of insulin: Secondary | ICD-10-CM | POA: Diagnosis not present

## 2019-12-18 DIAGNOSIS — J984 Other disorders of lung: Secondary | ICD-10-CM | POA: Diagnosis not present

## 2019-12-22 ENCOUNTER — Other Ambulatory Visit: Payer: Self-pay | Admitting: Family Medicine

## 2019-12-22 DIAGNOSIS — I10 Essential (primary) hypertension: Secondary | ICD-10-CM

## 2019-12-24 ENCOUNTER — Other Ambulatory Visit: Payer: Self-pay | Admitting: Family Medicine

## 2019-12-24 DIAGNOSIS — C44619 Basal cell carcinoma of skin of left upper limb, including shoulder: Secondary | ICD-10-CM | POA: Diagnosis not present

## 2019-12-24 MED ORDER — FLUTICASONE PROPIONATE 50 MCG/ACT NA SUSP: 2.0000 | Freq: Every day | NASAL | 2 refills | Status: DC

## 2019-12-24 NOTE — Telephone Encounter (Signed)
This medication hasn't been refilled since 04/16/2017. Please review refill request.

## 2019-12-24 NOTE — Telephone Encounter (Signed)
[facility] faxed refill request for the following medications:  fluticasone (FLONASE) 50 MCG/ACT nasal spray  Please advise.  Thanks, [employer]

## 2020-01-07 DIAGNOSIS — C44619 Basal cell carcinoma of skin of left upper limb, including shoulder: Secondary | ICD-10-CM | POA: Diagnosis not present

## 2020-01-09 ENCOUNTER — Other Ambulatory Visit: Payer: Self-pay | Admitting: Family Medicine

## 2020-01-15 ENCOUNTER — Other Ambulatory Visit: Payer: Self-pay

## 2020-01-15 MED ORDER — BUDESONIDE-FORMOTEROL FUMARATE 80-4.5 MCG/ACT IN AERO: 2.0000 | INHALATION_SPRAY | Freq: Two times a day (BID) | RESPIRATORY_TRACT | 1 refills | Status: DC

## 2020-01-15 NOTE — Telephone Encounter (Signed)
Rx for symbicort 80 has been [facility] ,as requested by pharmacy.

## 2020-01-19 ENCOUNTER — Other Ambulatory Visit: Payer: Self-pay | Admitting: Family Medicine

## 2020-01-19 NOTE — Telephone Encounter (Signed)
Requested Prescriptions  Pending Prescriptions Disp Refills  . levothyroxine (SYNTHROID) 125 MCG tablet [Pharmacy Med Name: LEVOTHYROXINE SODIUM 125 MCG TAB] 90 tablet 1    Sig: TAKE 1 TABLET EVERY DAY ON EMPTY STOMACHWITH A GLASS OF WATER AT LEAST 30-60 [place] BREAKFAST     Endocrinology:  Hypothyroid Agents Failed - 01/19/2020  2:00 PM      Failed - TSH needs to be rechecked within 3 months after an abnormal result. Refill until TSH is due.      Passed - TSH in normal range and within 360 days    TSH  Date Value Ref Range Status  05/26/2019 2.720 0.450 - 4.500 uIU/mL Final         Passed - Valid encounter within last 12 months    Recent Outpatient Visits          3 months ago Diabetes mellitus with nephropathy [facility])   [facility] [name], MD   7 months ago Diabetes mellitus with nephropathy [facility])   [facility] [name], MD   9 months ago Sore throat   [place], [place], [place]   12 months ago Resistant hypertension   [facility] [name], MD   1 year ago Carpal tunnel syndrome on right   [facility] [name], MD      Future Appointments            In 2 days Flora Lipps, MD [facility] [place]   In 1 month [name], [name], MD [facility] [place], [place]   In 10 months Asher, Ronda [place], [place]

## 2020-01-20 ENCOUNTER — Telehealth: Payer: Self-pay

## 2020-01-21 ENCOUNTER — Other Ambulatory Visit: Payer: Self-pay

## 2020-01-21 ENCOUNTER — Ambulatory Visit (INDEPENDENT_AMBULATORY_CARE_PROVIDER_SITE_OTHER): Payer: PPO | Admitting: Internal Medicine

## 2020-01-21 ENCOUNTER — Ambulatory Visit
Admission: RE | Admit: 2020-01-21 | Discharge: 2020-01-21 | Disposition: A | Discharge status: Home / Self Care | Payer: PPO | Source: Ambulatory Visit | Attending: Internal Medicine | Admitting: Internal Medicine

## 2020-01-21 ENCOUNTER — Encounter: Payer: Self-pay | Admitting: Internal Medicine

## 2020-01-21 ENCOUNTER — Ambulatory Visit
Admission: RE | Admit: 2020-01-21 | Discharge: 2020-01-21 | Disposition: A | Discharge status: Home / Self Care | Payer: PPO | Attending: Internal Medicine | Admitting: Internal Medicine

## 2020-01-21 VITALS — BP 130/72 | HR 75 | Temp 97.3°F | Ht 68.0 in | Wt 273.0 lb

## 2020-01-21 DIAGNOSIS — G4733 Obstructive sleep apnea (adult) (pediatric): Secondary | ICD-10-CM

## 2020-01-21 DIAGNOSIS — J9611 Chronic respiratory failure with hypoxia: Secondary | ICD-10-CM | POA: Diagnosis not present

## 2020-01-21 DIAGNOSIS — R0602 Shortness of breath: Secondary | ICD-10-CM | POA: Diagnosis not present

## 2020-01-21 NOTE — Progress Notes (Signed)
Subjective:    Patient ID: Jacob Herrera, male    DOB: 10/03/1950, 69 y.o.   MRN: [id number]  Synopsis: Mr. Mastel first saw the [facility] pulmonary clinic in February 2015 for evaluation of shortness of breath. He had a past medical history significant for asthma. Pulmonary function testing was normal with the exception of restriction secondary to obesity. He was started on a combination long acting bronchodilator/inhaled corticosteroid inhaler and he noted significant benefit from this.  Compliance reports as listed below 10/2015  100% compliance Leak 117.4L/min AHI 12.9  06/2016 compliance report 100% compliance Leak 118 L/min AHI 9.1  11/18 100% compliance Leak 24 L/min AHI14  6.18.19 AHI 30 100% compliance No leak IPAP 25 EPAP 5  10/19 AHI 10 100% compliance BiPAP IPAP 25 EPAP 12  02/25/2019 AHI of 10 100% compliance   12/2019 AHI 4.8 100% compliance days and >4 hrs BiPAP IPAP 25 EPAP 12 Minimal leak  CC:  Follow up OSA   HPI  History of OSA Non-smoker History of CHF Patient is very compliant with OSA therapy 100% compliance  Excellent report   No exacerbation at this time No evidence of heart failure at this time No evidence or signs of infection at this time No respiratory distress No fevers, chills, nausea, vomiting, diarrhea No evidence of lower extremity edema No evidence hemoptysis   Oxygen as needed with BiPAP Patient needs oxygen to survive    Review of Systems  Constitutional: Negative for chills, fatigue and fever.  HENT: Negative for postnasal drip.   Respiratory: Negative for chest tightness, shortness of breath and wheezing.   Cardiovascular: Positive for leg swelling. Negative for chest pain and palpitations.  Musculoskeletal: Positive for back pain and joint swelling.  All other systems reviewed and are negative.   BP 130/72 (BP Location: Right Wrist, Patient Position: Sitting, Cuff Size: Normal)   Pulse  75   Temp (!) 97.3 F (36.3 C) (Temporal)   Ht 5\' 8"  (1.727 m)   Wt 273 lb (123.8 kg)   SpO2 95%   BMI 41.51 kg/m   Physical Examination:   General Appearance: No distress  Neuro:without focal findings,  speech normal,  HEENT: PERRLA, EOM intact.   Pulmonary: normal breath sounds, No wheezing.  CardiovascularNormal S1,S2.  No m/r/g.   Abdomen: Benign, Soft, non-tender. Renal:  No costovertebral tenderness  GU:  Not performed at this time. Endoc: No evident thyromegaly Skin:   warm, no rashes, no ecchymosis  Extremities: normal, no cyanosis, clubbing. PSYCHIATRIC: Mood, affect within normal limits.   ALL OTHER ROS ARE NEGATIVE    05/2013 Full FPT > ratio 82%, FEV1 1.95L (67% pred), no change with BD, TLC 3.91 L (64% pred), ERV 0.26L (18% pred), DLCO 15.6 (54% pred)     Assessment & Plan:      69 year old morbidly obese white male with severe sleep apnea with a history of cardiomyopathy status post stent with underlying reactive airways disease with chronic kidney disease chronic hypoxic respiratory failure    Overall patient has very poor prognosis due to his morbid obesity and comorbid conditions with underlying multiorgan dysfunction   Severe sleep apnea  Patient is very compliant with his CPAP  Patient uses and benefits from CPAP therapy  Chronic shortness of breath and dyspnea exertion multifactorial related to morbid obesity underlying sleep apnea and reactive airways disease Responds well to inhaler therapy no indication for prednisone at this time  Cardiomyopathy and hypertension Both of these diseases are  related to underlying sleep apnea and therefore therapy for sleep apnea is essential for treating these diseases   CKD Follow-up with nephrology  Obesity -recommend significant weight loss -recommend changing diet  Deconditioned state -Recommend increased daily activity and exercise   Chronic hypoxic respiratory failure with OSA and OHS on  BiPAP Patient is and benefits from oxygen therapy He needs this for survival  Patient wants new DME company Must be with [employer] for 5 years  COVID-19 EDUCATION: The signs and symptoms of COVID-19 were discussed with the patient and how to seek care for testing.  The importance of social distancing was discussed today. Hand Washing Techniques and avoid touching face was advised.     MEDICATION ADJUSTMENTS/LABS AND TESTS ORDERED: Continue inhaler therapy as prescribed [place] BiPAP as prescribed Patient needs oxygen for survival   CURRENT MEDICATIONS REVIEWED AT LENGTH WITH PATIENT TODAY   Patient satisfied with Plan of action and management. All questions answered  Follow up in  1 year  Total time spent 12mins   Corrin Parker, M.D.  [facility]  Medical Director [place] Director [facility] Cardio-Pulmonary Department

## 2020-01-21 NOTE — Patient Instructions (Addendum)
Continue therapy for sleep apnea Inhalers as prescribed  Obtain CXR

## 2020-01-21 NOTE — Progress Notes (Signed)
.. Recent Relevant Labs: Lab Results  Component Value Date/Time   HGBA1C 6.4 (A) 10/20/2019 03:10 PM   HGBA1C 6.2 (A) 05/26/2019 03:53 PM   HGBA1C 6.3 (H) 03/20/2017 03:03 PM   HGBA1C 8.4 (A) 09/08/2014 12:00 AM   MICROALBUR neg 03/20/2017 02:25 PM    Kidney Function Lab Results  Component Value Date/Time   CREATININE 1.85 (H) 10/07/2018 08:25 AM   CREATININE 1.53 (H) 12/21/2016 03:27 PM   CREATININE 1.36 (H) 07/08/2013 05:55 AM   CREATININE 1.44 (H) 07/04/2013 12:26 PM   GFRNONAA 36 (L) 10/07/2018 08:25 AM   GFRNONAA 54 (L) 07/08/2013 05:55 AM   GFRAA 42 (L) 10/07/2018 08:25 AM   GFRAA >60 07/08/2013 05:55 AM    . Current antihyperglycemic regimen:  o None in place, patient says he knows he should exercise and drink more water but it's difficult with his other health problems . What recent interventions/DTPs have been made to improve glycemic control:  o none . Have there been any recent hospitalizations or ED visits since last visit with CPP? No . Patient denies hypoglycemic symptoms,  . Patient denies hyperglycemic symptoms, including . How often are you checking your blood sugar? 3 times daily . What are your blood sugars ranging?  o Fasting: 140 o Before meals: 160 o After meals:  o Bedtime: 180 . During the week, how often does your blood glucose drop below 70? Never . Are you checking your feet daily/regularly? Yes   Spoke with patient regarding BP  Recent Office Vitals: BP Readings from Last 3 Encounters:  11/20/19 (!) 142/80  10/20/19 (!) 158/70  05/26/19 132/66   Pulse Readings from Last 3 Encounters:  11/20/19 78  10/20/19 69  05/26/19 69    Wt Readings from Last 3 Encounters:  11/20/19 (!) 260 lb (117.9 kg)  10/20/19 269 lb (122 kg)  05/26/19 271 lb 12.8 oz (123.3 kg)     Kidney Function Lab Results  Component Value Date/Time   CREATININE 1.85 (H) 10/07/2018 08:25 AM   CREATININE 1.53 (H) 12/21/2016 03:27 PM   CREATININE 1.36 (H) 07/08/2013  05:55 AM   CREATININE 1.44 (H) 07/04/2013 12:26 PM   GFRNONAA 36 (L) 10/07/2018 08:25 AM   GFRNONAA 54 (L) 07/08/2013 05:55 AM   GFRAA 42 (L) 10/07/2018 08:25 AM   GFRAA >60 07/08/2013 05:55 AM    BMP Latest Ref Rng & Units 10/07/2018 12/21/2016 04/01/2015  Glucose 65 - 99 mg/dL 134(H) 70 152(H)  BUN 8 - 27 mg/dL 33(H) 25 21(H)  Creatinine 0.76 - 1.27 mg/dL 1.85(H) 1.53(H) 1.38(H)  BUN/Creat Ratio 10 - 24 18 16  -  Sodium 134 - 144 mmol/L 137 139 137  Potassium 3.5 - 5.2 mmol/L 3.8 4.7 3.9  Chloride 96 - 106 mmol/L 99 103 99(L)  CO2 20 - 29 mmol/L 22 18(L) 30  Calcium 8.6 - 10.2 mg/dL 10.0 9.6 9.3    . How often are you checking your Blood Pressure? several times per month . Current home BP readings: 150/70 . What recent interventions/DTPs have been made by any provider to improve Blood Pressure control since last CPP Visit: none . Any recent hospitalizations or ED visits since last visit with CPP? No . What diet changes have been made to improve Blood Pressure Control?  o Patient is trying to decrease salt but is finding it difficult. Reviewed ways to increase flavor without salt. . What exercise is being done to improve your Blood Pressure Control?  o Patient finds exercise  difficult due to other health problems, especially his ongoing back pain. Reassurance given that despite back pain he could still do exercises of some sort of movement. Explained that being sedentary will actually worsen back pain and encouraged stretching. Encouraged patient to speak with providers to find appropriate safe plan for his conditions.  Adherence Review: Is the patient currently on ACE/ARB medication? Yes Does the patient have >5 day gap between last estimated fill dates? Yes  Patient needs patient assistance for Trulicity, Engineer, agricultural and Humalog from [facility]. Coordination with Milus Height, PharmD to provide correct medication dose and sig for all medications prior to faxing papers to [facility].    [place], [place]

## 2020-01-22 DIAGNOSIS — C44619 Basal cell carcinoma of skin of left upper limb, including shoulder: Secondary | ICD-10-CM | POA: Diagnosis not present

## 2020-01-30 ENCOUNTER — Other Ambulatory Visit: Payer: Self-pay | Admitting: Family Medicine

## 2020-01-30 DIAGNOSIS — E1121 Type 2 diabetes mellitus with diabetic nephropathy: Secondary | ICD-10-CM

## 2020-02-02 DIAGNOSIS — N2581 Secondary hyperparathyroidism of renal origin: Secondary | ICD-10-CM | POA: Diagnosis not present

## 2020-02-02 DIAGNOSIS — I129 Hypertensive chronic kidney disease with stage 1 through stage 4 chronic kidney disease, or unspecified chronic kidney disease: Secondary | ICD-10-CM | POA: Diagnosis not present

## 2020-02-02 DIAGNOSIS — R809 Proteinuria, unspecified: Secondary | ICD-10-CM | POA: Diagnosis not present

## 2020-02-02 DIAGNOSIS — N1832 Chronic kidney disease, stage 3b: Secondary | ICD-10-CM | POA: Diagnosis not present

## 2020-02-02 DIAGNOSIS — N041 Nephrotic syndrome with focal and segmental glomerular lesions: Secondary | ICD-10-CM | POA: Diagnosis not present

## 2020-02-09 DIAGNOSIS — N2581 Secondary hyperparathyroidism of renal origin: Secondary | ICD-10-CM | POA: Diagnosis not present

## 2020-02-09 DIAGNOSIS — N041 Nephrotic syndrome with focal and segmental glomerular lesions: Secondary | ICD-10-CM | POA: Diagnosis not present

## 2020-02-09 DIAGNOSIS — I129 Hypertensive chronic kidney disease with stage 1 through stage 4 chronic kidney disease, or unspecified chronic kidney disease: Secondary | ICD-10-CM | POA: Diagnosis not present

## 2020-02-09 DIAGNOSIS — N1832 Chronic kidney disease, stage 3b: Secondary | ICD-10-CM | POA: Diagnosis not present

## 2020-02-09 DIAGNOSIS — R809 Proteinuria, unspecified: Secondary | ICD-10-CM | POA: Diagnosis not present

## 2020-02-12 ENCOUNTER — Other Ambulatory Visit: Payer: Self-pay | Admitting: Internal Medicine

## 2020-02-12 DIAGNOSIS — J45909 Unspecified asthma, uncomplicated: Secondary | ICD-10-CM | POA: Diagnosis not present

## 2020-02-12 DIAGNOSIS — G4733 Obstructive sleep apnea (adult) (pediatric): Secondary | ICD-10-CM | POA: Diagnosis not present

## 2020-02-17 ENCOUNTER — Telehealth: Payer: Self-pay | Admitting: Pharmacist

## 2020-02-17 NOTE — Progress Notes (Cosign Needed)
Chronic Care Management Pharmacy Assistant   Name: Jacob Herrera  MRN: [id number] DOB: 03-16-1951  Reason for Encounter: Medication Review  Patient Questions:  1.  Have you seen any other providers since your last visit? No  2.  Any changes in your medicines or health? No   Jacob Herrera,  69 y.o. , male presents for their Follow-Up CCM visit with the clinical pharmacist via telephone.  PCP : [name], MD  Allergies:   Allergies  Allergen Reactions  . Chlorthalidone     D/c by dr. Juleen China for worsening renal function 03/2019  . Spironolactone     gynecomastia    Medications: Outpatient Encounter Medications as of 02/17/2020  Medication Sig  . albuterol (PROVENTIL) (2.5 MG/3ML) 0.083% nebulizer solution Take 3 mLs (2.5 mg total) by nebulization every 4 (four) hours. And PRN (Patient taking differently: Take 2.5 mg by nebulization as needed. )  . amLODipine (NORVASC) 10 MG tablet Take 1 tablet (10 mg total) by mouth daily.  [name] Kitchen aspirin 81 MG tablet Take 81 mg by mouth daily.  [name] Kitchen atorvastatin (LIPITOR) 80 MG tablet Take 1 tablet (80 mg total) by mouth at bedtime.  . budesonide-formoterol (SYMBICORT) 80-4.5 MCG/ACT inhaler INHALE 2 PUFFS INTO LUNGS TWICE DAILY  . carvedilol (COREG) 25 MG tablet TAKE 1 TABLET BY MOUTH TWICE DAILY  . Cholecalciferol (VITAMIN D3 PO) Take 1,000 Units by mouth daily.   . cloNIDine (CATAPRES) 0.2 MG tablet TAKE ONE TABLET BY MOUTH TWICE DAILY  . clopidogrel (PLAVIX) 75 MG tablet Take 1 tablet (75 mg total) by mouth daily.  . Cyanocobalamin (VITAMIN B 12 PO) Take 1,000 mcg by mouth daily.   [name] Kitchen doxazosin (CARDURA) 8 MG tablet Take 1 tablet (8 mg total) by mouth daily.  . finasteride (PROSCAR) 5 MG tablet TAKE ONE TABLET EVERY DAY  . fluticasone (FLONASE) 50 MCG/ACT nasal spray Place 2 sprays into both nostrils daily.  . furosemide (LASIX) 40 MG tablet Take 40 mg by mouth 2 (two) times daily.   [name] Kitchen gabapentin (NEURONTIN) 600 MG tablet Take 1  tablet (600 mg total) by mouth 2 (two) times daily.  [name] Kitchen glucose blood (ONETOUCH ULTRA) test strip USE ONE STRIP THREE TIMES DAILY FOR INSULIN DEPENDENT DEPENDENT TYPE 2 DIABETES  . hydrALAZINE (APRESOLINE) 100 MG tablet TAKE ONE TABLET BY MOUTH 3 TIMES DAILY  . insulin aspart (NOVOLOG FLEXPEN) 100 UNIT/ML FlexPen Take 16 units before lunch.  . insulin glargine (LANTUS SOLOSTAR) 100 UNIT/ML Solostar Pen TITRATE UP TO 80 UNITS A DAY AS DIRECTED  . Insulin Pen Needle (BD PEN NEEDLE NANO U/F) 32G X 4 MM MISC USE DAILY AS DIRECTED  . isosorbide mononitrate (IMDUR) 60 MG 24 hr tablet Take 1 tablet (60 mg total) by mouth daily.  [name] Kitchen levothyroxine (SYNTHROID) 125 MCG tablet TAKE 1 TABLET EVERY DAY ON EMPTY STOMACHWITH A GLASS OF WATER AT LEAST 30-60 MINBEFORE BREAKFAST  . losartan (COZAAR) 100 MG tablet Take 1 tablet (100 mg total) by mouth daily.  [name] Kitchen Lysine 500 MG CAPS Take 1 capsule by mouth daily.  [name] Kitchen oxyCODONE (OXY IR/ROXICODONE) 5 MG immediate release tablet Take 1 tablet (5 mg total) by mouth daily as needed for severe pain. (Patient taking differently: Take 5 mg by mouth daily as needed for severe pain. Rarely)  . OZEMPIC, 1 MG/DOSE, 2 MG/1.5ML SOPN INJECT 1MG  INTO THE SKIN ONCE A WEEK  . OZEMPIC, 1 MG/DOSE, 4 MG/3ML SOPN INJECT 1MG  INTO THE SKIN ONCE A WEEK  .  SENNA PO Take 1 capsule by mouth in the morning, at noon, in the evening, and at bedtime.   No facility-administered encounter medications on file as of 02/17/2020.    Current Diagnosis: Patient Active Problem List   Diagnosis Date Noted  . Vitamin D deficiency 05/27/2019  . Vitamin B12 deficiency 05/27/2019  . Trigger middle finger of right hand 01/20/2019  . Carpal tunnel syndrome, right 01/20/2019  . 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 Primary osteoarthritis of first carpometacarpal joint of right hand 07/30/2016  . 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 Diastolic heart failure ([place])  02/15/2015  . Regional wall motion abnormality of heart 02/15/2015  . Abnormal findings on diagnostic imaging of heart and coronary circulation 02/15/2015  . Restrictive lung disease 11/10/2014  . History of adenomatous polyp of colon 11/09/2014  . Hypothyroidism 11/09/2014  . Diabetes mellitus with nephropathy ([place]) 11/09/2014  . Hyperlipidemia 11/09/2014  . Depression 11/09/2014  . Insomnia 11/09/2014  . Resistant hypertension 11/09/2014  . GERD (gastroesophageal reflux disease) 11/09/2014  . Chronic kidney disease (CKD), stage III (moderate) ([place]) 11/09/2014  . Dyspnea on exertion 11/09/2014  . History of basal cell cancer 09/15/2014  . DDD (degenerative disc disease), lumbar 09/09/2014  . Facet syndrome, lumbar 09/09/2014  . 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 Greater trochanteric bursitis of both hips 09/09/2014  . Allergic rhinitis 08/12/2014  . Benign prostatic hyperplasia with urinary obstruction 06/24/2013  . Nocturia 05/30/2013  . Frequency of micturition 05/30/2013  . Urge incontinence 05/30/2013  . Asthma, chronic 05/29/2013  . Morbid obesity ([place]) 05/29/2013  . OSA treated with BiPAP 05/29/2013  . Scoliosis 11/28/2011  . SCC (squamous cell carcinoma), face 08/01/2011    Goals Addressed   None     Follow-Up:  Patient Assistance Coordination  Discussion with Milus Height regarding PAP follow up needs. Patient is catastrophic coverage and paying 5% medication cost. Lily application for [employer] and Trulicity being worked on by [employer] to address needs for new dosage and strength since new medications. Per conversation Clare Gandy will handle today.

## 2020-02-23 ENCOUNTER — Ambulatory Visit: Payer: Self-pay | Admitting: Family Medicine

## 2020-02-23 ENCOUNTER — Ambulatory Visit: Payer: Self-pay

## 2020-03-03 ENCOUNTER — Telehealth: Payer: Self-pay

## 2020-03-03 NOTE — Progress Notes (Signed)
Chronic Care Management Pharmacy Assistant   Name: Colby Reels  MRN: [id number] DOB: [birth year]  Reason for Encounter: Medication Review  Patient Questions:  1.  Have you seen any other providers since your last visit? No  2.  Any changes in your medicines or health? No   Jacob Herrera,  69 y.o. , male presents for their Follow-Up CCM visit with the clinical pharmacist via telephone.  PCP : [name], MD  Allergies:   Allergies  Allergen Reactions  . Chlorthalidone     D/c by dr. Juleen China for worsening renal function 03/2019  . Spironolactone     gynecomastia    Medications: Outpatient Encounter Medications as of 03/03/2020  Medication Sig  . albuterol (PROVENTIL) (2.5 MG/3ML) 0.083% nebulizer solution Take 3 mLs (2.5 mg total) by nebulization every 4 (four) hours. And PRN (Patient taking differently: Take 2.5 mg by nebulization as needed. )  . amLODipine (NORVASC) 10 MG tablet Take 1 tablet (10 mg total) by mouth daily.  [name] Kitchen aspirin 81 MG tablet Take 81 mg by mouth daily.  [name] Kitchen atorvastatin (LIPITOR) 80 MG tablet Take 1 tablet (80 mg total) by mouth at bedtime.  . budesonide-formoterol (SYMBICORT) 80-4.5 MCG/ACT inhaler INHALE 2 PUFFS INTO LUNGS TWICE DAILY  . carvedilol (COREG) 25 MG tablet TAKE 1 TABLET BY MOUTH TWICE DAILY  . Cholecalciferol (VITAMIN D3 PO) Take 1,000 Units by mouth daily.   . cloNIDine (CATAPRES) 0.2 MG tablet TAKE ONE TABLET BY MOUTH TWICE DAILY  . clopidogrel (PLAVIX) 75 MG tablet Take 1 tablet (75 mg total) by mouth daily.  . Cyanocobalamin (VITAMIN B 12 PO) Take 1,000 mcg by mouth daily.   [name] Kitchen doxazosin (CARDURA) 8 MG tablet Take 1 tablet (8 mg total) by mouth daily.  . finasteride (PROSCAR) 5 MG tablet TAKE ONE TABLET EVERY DAY  . fluticasone (FLONASE) 50 MCG/ACT nasal spray Place 2 sprays into both nostrils daily.  . furosemide (LASIX) 40 MG tablet Take 40 mg by mouth 2 (two) times daily.   [name] Kitchen gabapentin (NEURONTIN) 600 MG tablet Take 1  tablet (600 mg total) by mouth 2 (two) times daily.  [name] Kitchen glucose blood (ONETOUCH ULTRA) test strip USE ONE STRIP THREE TIMES DAILY FOR INSULIN DEPENDENT DEPENDENT TYPE 2 DIABETES  . hydrALAZINE (APRESOLINE) 100 MG tablet TAKE ONE TABLET BY MOUTH 3 TIMES DAILY  . insulin aspart (NOVOLOG FLEXPEN) 100 UNIT/ML FlexPen Take 16 units before lunch.  . insulin glargine (LANTUS SOLOSTAR) 100 UNIT/ML Solostar Pen TITRATE UP TO 80 UNITS A DAY AS DIRECTED  . Insulin Pen Needle (BD PEN NEEDLE NANO U/F) 32G X 4 MM MISC USE DAILY AS DIRECTED  . isosorbide mononitrate (IMDUR) 60 MG 24 hr tablet Take 1 tablet (60 mg total) by mouth daily.  [name] Kitchen levothyroxine (SYNTHROID) 125 MCG tablet TAKE 1 TABLET EVERY DAY ON EMPTY STOMACHWITH A GLASS OF WATER AT LEAST 30-60 MINBEFORE BREAKFAST  . losartan (COZAAR) 100 MG tablet Take 1 tablet (100 mg total) by mouth daily.  [name] Kitchen Lysine 500 MG CAPS Take 1 capsule by mouth daily.  [name] Kitchen oxyCODONE (OXY IR/ROXICODONE) 5 MG immediate release tablet Take 1 tablet (5 mg total) by mouth daily as needed for severe pain. (Patient taking differently: Take 5 mg by mouth daily as needed for severe pain. Rarely)  . OZEMPIC, 1 MG/DOSE, 2 MG/1.5ML SOPN INJECT 1MG  INTO THE SKIN ONCE A WEEK  . OZEMPIC, 1 MG/DOSE, 4 MG/3ML SOPN INJECT 1MG  INTO THE SKIN ONCE A WEEK  .  SENNA PO Take 1 capsule by mouth in the morning, at noon, in the evening, and at bedtime.   No facility-administered encounter medications on file as of 03/03/2020.    Current Diagnosis: Patient Active Problem List   Diagnosis Date Noted  . Vitamin D deficiency 05/27/2019  . Vitamin B12 deficiency 05/27/2019  . Trigger middle finger of right hand 01/20/2019  . Carpal tunnel syndrome, right 01/20/2019  . 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 Primary osteoarthritis of first carpometacarpal joint of right hand 07/30/2016  . 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 Diastolic heart failure ([place])  02/15/2015  . Regional wall motion abnormality of heart 02/15/2015  . Abnormal findings on diagnostic imaging of heart and coronary circulation 02/15/2015  . Restrictive lung disease 11/10/2014  . History of adenomatous polyp of colon 11/09/2014  . Hypothyroidism 11/09/2014  . Diabetes mellitus with nephropathy ([place]) 11/09/2014  . Hyperlipidemia 11/09/2014  . Depression 11/09/2014  . Insomnia 11/09/2014  . Resistant hypertension 11/09/2014  . GERD (gastroesophageal reflux disease) 11/09/2014  . Chronic kidney disease (CKD), stage III (moderate) ([place]) 11/09/2014  . Dyspnea on exertion 11/09/2014  . History of basal cell cancer 09/15/2014  . DDD (degenerative disc disease), lumbar 09/09/2014  . Facet syndrome, lumbar 09/09/2014  . 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 Greater trochanteric bursitis of both hips 09/09/2014  . Allergic rhinitis 08/12/2014  . Benign prostatic hyperplasia with urinary obstruction 06/24/2013  . Nocturia 05/30/2013  . Frequency of micturition 05/30/2013  . Urge incontinence 05/30/2013  . Asthma, chronic 05/29/2013  . Morbid obesity ([place]) 05/29/2013  . OSA treated with BiPAP 05/29/2013  . Scoliosis 11/28/2011  . SCC (squamous cell carcinoma), face 08/01/2011    Goals Addressed   None     Follow-Up:  Patient [place] to check on application for Basaglar,Trulicity, and Humalog. Medication was approved until Dec 31,2021. Processing of medication for shipment started on Nov 2. Pt will need new application by Dec 31 for next year. Pt informed of findings.

## 2020-03-04 ENCOUNTER — Other Ambulatory Visit: Payer: Self-pay | Admitting: Family Medicine

## 2020-03-04 NOTE — Telephone Encounter (Signed)
Requested medication (s) are due for refill today: yes   Requested medication (s) are on the active medication list: yes  Last refill:  12/09/2018 #90 3 refills  Future visit scheduled: yes in 1 week   Notes to clinic:  expired medication. Do you want to renew Rx?     Requested Prescriptions  Pending Prescriptions Disp Refills   doxazosin (CARDURA) 8 MG tablet [Pharmacy Med Name: DOXAZOSIN MESYLATE 8 MG TAB] 90 tablet 3    Sig: TAKE ONE TABLET BY MOUTH EVERY DAY -STOP [place]-      Cardiovascular:  Alpha Blockers Passed - 03/04/2020 11:16 AM      Passed - Last BP in normal range    BP Readings from Last 1 Encounters:  01/21/20 130/72          Passed - Valid encounter within last 6 months    Recent Outpatient Visits           4 months ago Diabetes mellitus with nephropathy [facility])   [facility] [place] [name], MD   9 months ago Diabetes mellitus with nephropathy [facility])   [facility] [name], MD   11 months ago Sore throat   [facility] Collet M, [place]   1 year ago Resistant hypertension   [facility] [name], MD   1 year ago Carpal tunnel syndrome on right   [facility] [name], MD       Future Appointments             In 1 week [name], [name], MD [facility], [place]   In 8 months [place], Ronda Fairly, [place]

## 2020-03-15 ENCOUNTER — Ambulatory Visit: Payer: PPO

## 2020-03-15 ENCOUNTER — Ambulatory Visit (INDEPENDENT_AMBULATORY_CARE_PROVIDER_SITE_OTHER): Payer: PPO | Admitting: Family Medicine

## 2020-03-15 ENCOUNTER — Ambulatory Visit: Payer: Self-pay

## 2020-03-15 ENCOUNTER — Other Ambulatory Visit: Payer: Self-pay

## 2020-03-15 ENCOUNTER — Encounter: Payer: Self-pay | Admitting: Family Medicine

## 2020-03-15 VITALS — BP 151/74 | HR 68 | Temp 98.6°F | Resp 20 | Ht 68.0 in | Wt 275.0 lb

## 2020-03-15 DIAGNOSIS — E1121 Type 2 diabetes mellitus with diabetic nephropathy: Secondary | ICD-10-CM | POA: Diagnosis not present

## 2020-03-15 DIAGNOSIS — N1832 Chronic kidney disease, stage 3b: Secondary | ICD-10-CM

## 2020-03-15 DIAGNOSIS — I10 Essential (primary) hypertension: Secondary | ICD-10-CM | POA: Diagnosis not present

## 2020-03-15 LAB — POCT GLYCOSYLATED HEMOGLOBIN (HGB A1C)
Est. average glucose Bld gHb Est-mCnc: 146
Hemoglobin A1C: 6.7 % — AB (ref 4.0–5.6)

## 2020-03-15 NOTE — Progress Notes (Signed)
Established patient visit   Patient: Jacob Herrera   DOB: 20-Oct-1950   69 y.o. Male  MRN: [id number] Visit Date: 03/15/2020  Today's healthcare provider: Lelon Huh, MD   Chief Complaint  Patient presents with  . Congestive Heart Failure  . Diabetes  . Hypertension   Subjective    HPI  Diabetes Mellitus Type II, Follow-up  Lab Results  Component Value Date   HGBA1C 6.7 (A) 03/15/2020   HGBA1C 6.4 (A) 10/20/2019   HGBA1C 6.2 (A) 05/26/2019   Wt Readings from Last 3 Encounters:  03/15/20 275 lb (124.7 kg)  01/21/20 273 lb (123.8 kg)  11/20/19 (!) 260 lb (117.9 kg)   Last seen for diabetes 5 months ago.  Management since then includes continuing same medications. Patient was referred to chronic care management/ patient assistance to help with cost of medications.  He reports good compliance with treatment. He is not having side effects.  Symptoms: Yes fatigue No foot ulcerations  No appetite changes No nausea  No paresthesia of the feet  No polydipsia  No polyuria No visual disturbances   No vomiting     Home blood sugar records: fasting range: 140s  Episodes of hypoglycemia? No    Current insulin regiment: Patient reports that he is now on Humalog and insulin glargine 30-35 units a day Most Recent Eye Exam: 03/26/2020 Current exercise: no regular exercise Current diet habits: well balanced  Pertinent Labs: Lab Results  Component Value Date   CHOL 119 05/26/2019   HDL 33 (L) 05/26/2019   LDLCALC 46 05/26/2019   TRIG 252 (H) 05/26/2019   CHOLHDL 3.6 05/26/2019   Lab Results  Component Value Date   NA 137 10/07/2018   K 3.8 10/07/2018   CREATININE 1.85 (H) 10/07/2018   GFRNONAA 36 (L) 10/07/2018   GFRAA 42 (L) 10/07/2018   GLUCOSE 134 (H) 10/07/2018     Hypertension, follow-up  BP Readings from Last 3 Encounters:  03/15/20 (!) 151/74  01/21/20 130/72  11/20/19 (!) 142/80   Wt Readings from Last 3 Encounters:  03/15/20 275 lb (124.7  kg)  01/21/20 273 lb (123.8 kg)  11/20/19 (!) 260 lb (117.9 kg)     He was last seen for hypertension 5 months ago.  BP at that visit was 158/70. Management since that visit includes advising patient to work on improving diet and continue medications. Continue follow up nephrology.  He reports good compliance with treatment. He is not having side effects.  He is following a Regular diet. He is not exercising. He does not smoke.  Use of agents associated with hypertension: NSAIDS.   Outside blood pressures are checked occasionally. Symptoms: No chest pain No chest pressure  No palpitations No syncope  No dyspnea No orthopnea  No paroxysmal nocturnal dyspnea No lower extremity edema   Pertinent labs: Lab Results  Component Value Date   CHOL 119 05/26/2019   HDL 33 (L) 05/26/2019   LDLCALC 46 05/26/2019   TRIG 252 (H) 05/26/2019   CHOLHDL 3.6 05/26/2019   Lab Results  Component Value Date   NA 137 10/07/2018   K 3.8 10/07/2018   CREATININE 1.85 (H) 10/07/2018   GFRNONAA 36 (L) 10/07/2018   GFRAA 42 (L) 10/07/2018   GLUCOSE 134 (H) 10/07/2018     The ASCVD Risk score [name], et al., 2013) failed to calculate for the following reasons:   The valid total cholesterol range is 130 to 320 mg/dL  Follow up for CHF:  The patient was last seen for this 5 months ago. Changes made at last visit include none; continue same medications and continue routine follow up Dr. Juleen China.  He reports good compliance with treatment. He feels that condition is Unchanged. He is not having side effects.      Medications: Outpatient Medications Prior to Visit  Medication Sig  . albuterol (PROVENTIL) (2.5 MG/3ML) 0.083% nebulizer solution Take 3 mLs (2.5 mg total) by nebulization every 4 (four) hours. And PRN (Patient taking differently: Take 2.5 mg by nebulization as needed. )  . amLODipine (NORVASC) 10 MG tablet Take 1 tablet (10 mg total) by mouth daily.  [name] Kitchen aspirin 81 MG tablet  Take 81 mg by mouth daily.  [name] Kitchen atorvastatin (LIPITOR) 80 MG tablet Take 1 tablet (80 mg total) by mouth at bedtime.  . budesonide-formoterol (SYMBICORT) 80-4.5 MCG/ACT inhaler INHALE 2 PUFFS INTO LUNGS TWICE DAILY  . carvedilol (COREG) 25 MG tablet TAKE 1 TABLET BY MOUTH TWICE DAILY  . Cholecalciferol (VITAMIN D3 PO) Take 1,000 Units by mouth daily.   . cloNIDine (CATAPRES) 0.2 MG tablet TAKE ONE TABLET BY MOUTH TWICE DAILY  . clopidogrel (PLAVIX) 75 MG tablet Take 1 tablet (75 mg total) by mouth daily.  . Cyanocobalamin (VITAMIN B 12 PO) Take 1,000 mcg by mouth daily.   [name] Kitchen doxazosin (CARDURA) 8 MG tablet TAKE ONE TABLET BY MOUTH EVERY DAY -STOP FLOMAX-  . Dulaglutide (TRULICITY [place]) Inject into the skin.  . finasteride (PROSCAR) 5 MG tablet TAKE ONE TABLET EVERY DAY  . fluticasone (FLONASE) 50 MCG/ACT nasal spray Place 2 sprays into both nostrils daily.  . furosemide (LASIX) 40 MG tablet Take 40 mg by mouth 2 (two) times daily.   [name] Kitchen gabapentin (NEURONTIN) 600 MG tablet Take 1 tablet (600 mg total) by mouth 2 (two) times daily.  [name] Kitchen glucose blood (ONETOUCH ULTRA) test strip USE ONE STRIP THREE TIMES DAILY FOR INSULIN DEPENDENT DEPENDENT TYPE 2 DIABETES  . hydrALAZINE (APRESOLINE) 100 MG tablet TAKE ONE TABLET BY MOUTH 3 TIMES DAILY  . insulin aspart (NOVOLOG FLEXPEN) 100 UNIT/ML FlexPen Take 16 units before lunch.  . insulin glargine (LANTUS SOLOSTAR) 100 UNIT/ML Solostar Pen TITRATE UP TO 80 UNITS A DAY AS DIRECTED  . Insulin Pen Needle (BD PEN NEEDLE NANO U/F) 32G X 4 MM MISC USE DAILY AS DIRECTED  . isosorbide mononitrate (IMDUR) 60 MG 24 hr tablet Take 1 tablet (60 mg total) by mouth daily.  [name] Kitchen levothyroxine (SYNTHROID) 125 MCG tablet TAKE 1 TABLET EVERY DAY ON EMPTY STOMACHWITH A GLASS OF WATER AT LEAST 30-60 MINBEFORE BREAKFAST  . losartan (COZAAR) 100 MG tablet Take 1 tablet (100 mg total) by mouth daily.  [name] Kitchen Lysine 500 MG CAPS Take 1 capsule by mouth daily.  [name] Kitchen oxyCODONE (OXY IR/ROXICODONE) 5  MG immediate release tablet Take 1 tablet (5 mg total) by mouth daily as needed for severe pain. (Patient taking differently: Take 5 mg by mouth daily as needed for severe pain. Rarely)  . SENNA PO Take 1 capsule by mouth in the morning, at noon, in the evening, and at bedtime.  . [DISCONTINUED] OZEMPIC, 1 MG/DOSE, 2 MG/1.5ML SOPN INJECT 1MG  INTO THE SKIN ONCE A WEEK (Patient not taking: Reported on 03/15/2020)  . [DISCONTINUED] OZEMPIC, 1 MG/DOSE, 4 MG/3ML SOPN INJECT 1MG  INTO THE SKIN ONCE A WEEK (Patient not taking: Reported on 03/15/2020)   No facility-administered medications prior to visit.       Objective  BP (!) 151/74   Pulse 68   Temp 98.6 F (37 C)   Resp 20   Ht 5\' 8"  (1.727 m)   Wt 275 lb (124.7 kg)   BMI 41.81 kg/m    Physical Exam   General: Appearance:    Severely obese male in no acute distress  Eyes:    PERRL, conjunctiva/corneas clear, EOM's intact       Lungs:     Clear to auscultation bilaterally, respirations unlabored  Heart:    Normal heart rate. Normal rhythm. No murmurs, rubs, or gallops.   MS:   All extremities are intact.   Neurologic:   Awake, alert, oriented x 3. No apparent focal neurological           defect.         Results for orders placed or performed in visit on 03/15/20  POCT HgB A1C  Result Value Ref Range   Hemoglobin A1C 6.7 (A) 4.0 - 5.6 %   HbA1c POC (<> result, manual entry)     HbA1c, POC (prediabetic range)     HbA1c, POC (controlled diabetic range)     Est. average glucose Bld gHb Est-mCnc 146     Assessment & Plan     1. Diabetes mellitus with nephropathy (HCC) Stable on current medications, tolerating change from Ozempic to Trulicity. Continue current medications.    2. Stage 3b chronic kidney disease ([place]) Relatively stable, followed by Dr. Juleen China. Continue current medications.    3. Resistant hypertension BP up today, usually better controlled, Continue current medications.  Recheck in 4 months.   4. Morbid  obesity ([place]) Encouraged to stay as active as tolerated and work on healthy prudent, diabetic diet.      No follow-ups on file.         Lelon Huh, MD  [facility] [phone] (phone) [phone] (fax)  [place]

## 2020-03-15 NOTE — Patient Instructions (Signed)
.   Please review the attached list of medications and notify my office if there are any errors.   You are due for a Td (tetanus-diptheria-vaccine) which protects you from tetanus and diptheria. Please check with your insurance plan or pharmacy regarding coverage for this vaccine.

## 2020-03-17 ENCOUNTER — Other Ambulatory Visit: Payer: Self-pay | Admitting: Family Medicine

## 2020-03-17 DIAGNOSIS — I10 Essential (primary) hypertension: Secondary | ICD-10-CM

## 2020-03-29 ENCOUNTER — Other Ambulatory Visit: Payer: Self-pay | Admitting: Family Medicine

## 2020-03-29 DIAGNOSIS — H35373 Puckering of macula, bilateral: Secondary | ICD-10-CM | POA: Diagnosis not present

## 2020-03-29 DIAGNOSIS — H2513 Age-related nuclear cataract, bilateral: Secondary | ICD-10-CM | POA: Diagnosis not present

## 2020-03-29 LAB — HM DIABETES EYE EXAM

## 2020-04-03 ENCOUNTER — Other Ambulatory Visit: Payer: Self-pay | Admitting: Family Medicine

## 2020-04-03 DIAGNOSIS — E785 Hyperlipidemia, unspecified: Secondary | ICD-10-CM

## 2020-04-03 DIAGNOSIS — I10 Essential (primary) hypertension: Secondary | ICD-10-CM

## 2020-04-03 NOTE — Telephone Encounter (Signed)
Requested Prescriptions  Pending Prescriptions Disp Refills   cloNIDine (CATAPRES) 0.2 MG tablet [Pharmacy Med Name: CLONIDINE HCL 0.2 MG TAB] 180 tablet 0    Sig: TAKE ONE TABLET BY MOUTH TWICE DAILY     Cardiovascular:  Alpha-2 Agonists Failed - 04/03/2020  1:16 PM      Failed - Last BP in normal range    BP Readings from Last 1 Encounters:  03/15/20 (!) 151/74         Passed - Last Heart Rate in normal range    Pulse Readings from Last 1 Encounters:  03/15/20 68         Passed - Valid encounter within last 6 months    Recent Outpatient Visits          2 weeks ago Diabetes mellitus with nephropathy [facility])   [facility] [name], MD   5 months ago Diabetes mellitus with nephropathy [facility])   [facility] [name], MD   10 months ago Diabetes mellitus with nephropathy [facility])   [facility] [name], MD   1 year ago Sore throat   [facility] Carles Collet M, [place]   1 year ago Resistant hypertension   [place], [name], MD      Future Appointments            In 3 months [name], [name], MD [facility]   In 7 months Stoioff, Ronda Fairly, MD [facility]            atorvastatin (LIPITOR) 80 MG tablet [Pharmacy Med Name: ATORVASTATIN CALCIUM 80 MG TAB] 90 tablet 0    Sig: TAKE ONE TABLET AT BEDTIME     Cardiovascular:  Antilipid - Statins Failed - 04/03/2020  1:16 PM      Failed - LDL in normal range and within 360 days    LDL Cholesterol (Calc)  Date Value Ref Range Status  03/20/2017 83 mg/dL (calc) Final    Comment:    Reference range: <100 . Desirable range <100 mg/dL for primary prevention;   <70 mg/dL for patients with CHD or diabetic patients  with > or = 2 CHD risk factors. [name] Kitchen LDL-C is now calculated using the Martin-Hopkins  calculation, which is a validated novel method providing  better accuracy than the  Friedewald equation in the  estimation of LDL-C.  Cresenciano Genre et al. Annamaria Helling. 8250;539(76): 2061-2068  (http://education.[employer].com/faq/FAQ164)    LDL Chol Calc (NIH)  Date Value Ref Range Status  05/26/2019 46 0 - 99 mg/dL Final         Failed - HDL in normal range and within 360 days    HDL  Date Value Ref Range Status  05/26/2019 33 (L) >39 mg/dL Final         Failed - Triglycerides in normal range and within 360 days    Triglycerides  Date Value Ref Range Status  05/26/2019 252 (H) 0 - 149 mg/dL Final         Passed - Total Cholesterol in normal range and within 360 days    Cholesterol, Total  Date Value Ref Range Status  05/26/2019 119 100 - 199 mg/dL Final         Passed - Patient is not pregnant      Passed - Valid encounter within last 12 months    Recent Outpatient Visits          2 weeks  ago Diabetes mellitus with nephropathy [facility])   [facility] [name], MD   5 months ago Diabetes mellitus with nephropathy [facility])   [facility] [name], MD   10 months ago Diabetes mellitus with nephropathy [facility])   [facility] [name], MD   1 year ago Sore throat   [place], [place], [place]   1 year ago Resistant hypertension   [facility] [name], MD      Future Appointments            In 3 months [name], [name], MD [facility], [place]   In 7 months [place], Ronda Fairly, MD [place]

## 2020-04-14 ENCOUNTER — Other Ambulatory Visit: Payer: Self-pay | Admitting: Internal Medicine

## 2020-04-20 ENCOUNTER — Telehealth: Payer: Self-pay | Admitting: *Deleted

## 2020-04-20 NOTE — Chronic Care Management (AMB) (Signed)
  Care Management   Note  04/20/2020 Name: Jacob Herrera MRN: [id number] DOB: 22-May-1950  [name] is a 69 y.o. year old male who is a primary care patient of Caryn Section, [name], MD and is actively engaged with the care management team. I reached out to [name] by phone today to assist with re-scheduling a follow up visit with the Pharmacist.  Follow up plan: A telephone outreach attempt made. The care management team will reach out to the patient again over the next 7 days. If patient returns call to provider office, please advise to call [place] at [facility]  Direct Dial: [phone]

## 2020-04-20 NOTE — Chronic Care Management (AMB) (Signed)
  Care Management   Note  04/20/2020 Name: Jacob Herrera MRN: [id number] DOB: 10-Jul-1950  Teodor Prater is a 69 y.o. year old male who is a primary care patient of Caryn Section, [name], MD and is actively engaged with the care management team. I reached out to [name] by phone today to assist with re-scheduling a follow up visit with the Pharmacist.  Follow up plan: Face to Face appointment with care management team member scheduled for: 07/13/2020  [place] [facility]

## 2020-05-04 DIAGNOSIS — E7800 Pure hypercholesterolemia, unspecified: Secondary | ICD-10-CM | POA: Diagnosis not present

## 2020-05-04 DIAGNOSIS — H2511 Age-related nuclear cataract, right eye: Secondary | ICD-10-CM | POA: Diagnosis not present

## 2020-05-05 ENCOUNTER — Encounter: Payer: Self-pay | Admitting: Ophthalmology

## 2020-05-05 ENCOUNTER — Telehealth: Payer: Self-pay

## 2020-05-05 ENCOUNTER — Other Ambulatory Visit: Payer: Self-pay

## 2020-05-05 NOTE — Chronic Care Management (AMB) (Signed)
Chronic Care Management Pharmacy Assistant   Name: Jacob Herrera  MRN: [id number] DOB: 06/24/1950  Reason for Encounter: Disease State - Diabetes and Hypertension Adherence Call.   Patient Questions:   1.  Have you seen any other providers since your last visit? Yes. 03/15/2020 Lelon Huh, MD Family Medicine    2.  Any changes in your medicines or health? No.     PCP : [name], MD  Allergies:   Allergies  Allergen Reactions  . Chlorthalidone     D/c by dr. Juleen China for worsening renal function 03/2019  . Spironolactone     gynecomastia    Medications: Outpatient Encounter Medications as of 05/05/2020  Medication Sig  . albuterol (PROVENTIL) (2.5 MG/3ML) 0.083% nebulizer solution Take 3 mLs (2.5 mg total) by nebulization every 4 (four) hours. And PRN (Patient taking differently: Take 2.5 mg by nebulization as needed.)  . amLODipine (NORVASC) 10 MG tablet Take 1 tablet (10 mg total) by mouth daily.  [name] Kitchen aspirin 81 MG tablet Take 81 mg by mouth daily.  [name] Kitchen atorvastatin (LIPITOR) 80 MG tablet TAKE ONE TABLET AT BEDTIME  . carvedilol (COREG) 25 MG tablet TAKE 1 TABLET BY MOUTH TWICE DAILY  . Cholecalciferol (VITAMIN D3 PO) Take 1,000 Units by mouth daily.   . cloNIDine (CATAPRES) 0.2 MG tablet TAKE ONE TABLET BY MOUTH TWICE DAILY  . clopidogrel (PLAVIX) 75 MG tablet Take 1 tablet (75 mg total) by mouth daily.  . Cyanocobalamin (VITAMIN B 12 PO) Take 1,000 mcg by mouth daily.   [name] Kitchen doxazosin (CARDURA) 8 MG tablet TAKE ONE TABLET BY MOUTH EVERY DAY -STOP FLOMAX-  . Dulaglutide (TRULICITY [place]) Inject into the skin.  . finasteride (PROSCAR) 5 MG tablet TAKE ONE TABLET EVERY DAY  . fluticasone (FLONASE) 50 MCG/ACT nasal spray Place 2 sprays into both nostrils daily.  . furosemide (LASIX) 40 MG tablet Take 40 mg by mouth 2 (two) times daily.   [name] Kitchen gabapentin (NEURONTIN) 600 MG tablet Take 1 tablet (600 mg total) by mouth 2 (two) times daily.  [name] Kitchen glucose blood (ONETOUCH  ULTRA) test strip USE AS DIRECTED THREE TIMES A DAY  . hydrALAZINE (APRESOLINE) 100 MG tablet TAKE ONE TABLET BY MOUTH 3 TIMES DAILY  . insulin aspart (NOVOLOG FLEXPEN) 100 UNIT/ML FlexPen Take 16 units before lunch.  . insulin glargine (LANTUS SOLOSTAR) 100 UNIT/ML Solostar Pen TITRATE UP TO 80 UNITS A DAY AS DIRECTED  . Insulin Pen Needle (BD PEN NEEDLE NANO U/F) 32G X 4 MM MISC USE DAILY AS DIRECTED  . isosorbide mononitrate (IMDUR) 60 MG 24 hr tablet Take 1 tablet (60 mg total) by mouth daily.  [name] Kitchen levothyroxine (SYNTHROID) 125 MCG tablet TAKE 1 TABLET EVERY DAY ON EMPTY STOMACHWITH A GLASS OF WATER AT LEAST 30-60 MINBEFORE BREAKFAST  . losartan (COZAAR) 100 MG tablet Take 1 tablet (100 mg total) by mouth daily.  [name] Kitchen Lysine 500 MG CAPS Take 1 capsule by mouth daily.  [name] Kitchen oxyCODONE (OXY IR/ROXICODONE) 5 MG immediate release tablet Take 1 tablet (5 mg total) by mouth daily as needed for severe pain. (Patient not taking: Reported on 05/05/2020)  . SENNA PO Take 1 capsule by mouth in the morning, at noon, in the evening, and at bedtime.  . SYMBICORT 80-4.5 MCG/ACT inhaler INHALE 2 PUFFS INTO LUNGS TWICE DAILY   No facility-administered encounter medications on file as of 05/05/2020.    Current Diagnosis: Patient Active Problem List   Diagnosis Date Noted  . Vitamin  D deficiency 05/27/2019  . Vitamin B12 deficiency 05/27/2019  . Trigger middle finger of right hand 01/20/2019  . Carpal tunnel syndrome, right 01/20/2019  . 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 Primary osteoarthritis of first carpometacarpal joint of right hand 07/30/2016  . Coronary artery disease with unstable angina pectoris ([place]) 03/31/2015  . Diastolic heart failure ([place]) 02/15/2015  . Regional wall motion abnormality of heart 02/15/2015  . Abnormal findings on diagnostic imaging of heart and coronary circulation 02/15/2015  . Restrictive lung disease 11/10/2014  . History of adenomatous polyp of colon 11/09/2014   . Hypothyroidism 11/09/2014  . Diabetes mellitus with nephropathy ([place]) 11/09/2014  . Hyperlipidemia 11/09/2014  . Depression 11/09/2014  . Insomnia 11/09/2014  . Resistant hypertension 11/09/2014  . GERD (gastroesophageal reflux disease) 11/09/2014  . Chronic kidney disease (CKD), stage III (moderate) ([place]) 11/09/2014  . Dyspnea on exertion 11/09/2014  . History of basal cell cancer 09/15/2014  . DDD (degenerative disc disease), lumbar 09/09/2014  . Facet syndrome, lumbar 09/09/2014  . 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 Greater trochanteric bursitis of both hips 09/09/2014  . Allergic rhinitis 08/12/2014  . Benign prostatic hyperplasia with urinary obstruction 06/24/2013  . Nocturia 05/30/2013  . Frequency of micturition 05/30/2013  . Urge incontinence 05/30/2013  . Asthma, chronic 05/29/2013  . Morbid obesity ([place]) 05/29/2013  . OSA treated with BiPAP 05/29/2013  . Scoliosis 11/28/2011  . SCC (squamous cell carcinoma), face 08/01/2011   Recent Relevant Labs: Lab Results  Component Value Date/Time   HGBA1C 6.7 (A) 03/15/2020 03:36 PM   HGBA1C 6.4 (A) 10/20/2019 03:10 PM   HGBA1C 6.3 (H) 03/20/2017 03:03 PM   HGBA1C 8.4 (A) 09/08/2014 12:00 AM   MICROALBUR neg 03/20/2017 02:25 PM    Kidney Function Lab Results  Component Value Date/Time   CREATININE 1.85 (H) 10/07/2018 08:25 AM   CREATININE 1.53 (H) 12/21/2016 03:27 PM   CREATININE 1.36 (H) 07/08/2013 05:55 AM   CREATININE 1.44 (H) 07/04/2013 12:26 PM   GFRNONAA 36 (L) 10/07/2018 08:25 AM   GFRNONAA 54 (L) 07/08/2013 05:55 AM   GFRAA 42 (L) 10/07/2018 08:25 AM   GFRAA >60 07/08/2013 05:55 AM   . Current antihyperglycemic regimen:  o Trulicity  Inject into skin o lantus - 80 units a day o Novolog 16 units before lunch . What recent interventions/DTPs have been made to improve glycemic control:  o Patient states she has been taking her medication as directed.  . Have there been any recent hospitalizations or  ED visits since last visit with CPP? No  . Patient denies hypoglycemic symptoms, including Pale, Sweaty, Shaky, Hungry, Nervous/irritable and Vision changes . Patient reports hyperglycemic symptoms, including blurry vision, excessive thirst, fatigue, polyuria and weakness - Once in a while.   . How often are you checking your blood sugar? Once a day fasting  . What are your blood sugars ranging?  o Fasting: 05/10/2020 - 152 o Before meals: None o After meals: None o Bedtime: None . During the week, how often does your blood glucose drop below 70?  No   Are you checking your feet daily/regularly? Yes , no open sores or pain / Patient take Gabapentin for numbness / tingling.  Adherence Review: Is the patient currently on a STATIN medication? Yes.  Is the patient currently on ACE/ARB medication? Yes Does the patient have >5 day gap between last estimated fill dates? No  Reviewed chart prior to disease state call. Spoke with patient regarding BP  Recent  Office Vitals: BP Readings from Last 3 Encounters:  03/15/20 (!) 151/74  01/21/20 130/72  11/20/19 (!) 142/80   Pulse Readings from Last 3 Encounters:  03/15/20 68  01/21/20 75  11/20/19 78    Wt Readings from Last 3 Encounters:  03/15/20 275 lb (124.7 kg)  01/21/20 273 lb (123.8 kg)  11/20/19 (!) 260 lb (117.9 kg)     Kidney Function Lab Results  Component Value Date/Time   CREATININE 1.85 (H) 10/07/2018 08:25 AM   CREATININE 1.53 (H) 12/21/2016 03:27 PM   CREATININE 1.36 (H) 07/08/2013 05:55 AM   CREATININE 1.44 (H) 07/04/2013 12:26 PM   GFRNONAA 36 (L) 10/07/2018 08:25 AM   GFRNONAA 54 (L) 07/08/2013 05:55 AM   GFRAA 42 (L) 10/07/2018 08:25 AM   GFRAA >60 07/08/2013 05:55 AM    BMP Latest Ref Rng & Units 10/07/2018 12/21/2016 04/01/2015  Glucose 65 - 99 mg/dL 134(H) 70 152(H)  BUN 8 - 27 mg/dL 33(H) 25 21(H)  Creatinine 0.76 - 1.27 mg/dL 1.85(H) 1.53(H) 1.38(H)  BUN/Creat Ratio 10 - 24 18 16  -  Sodium 134 - 144  mmol/L 137 139 137  Potassium 3.5 - 5.2 mmol/L 3.8 4.7 3.9  Chloride 96 - 106 mmol/L 99 103 99(L)  CO2 20 - 29 mmol/L 22 18(L) 30  Calcium 8.6 - 10.2 mg/dL 10.0 9.6 9.3   . Current antihypertensive regimen:  o Cardura 8 mg one a day o Lasix 40 mg one a day o Hydralazine 100 mg take one tablet three times a day o Losartan 100 mg one tablet a day  . How often are you checking your Blood Pressure? He states they check it at doctors office.  . Current home BP readings: None  . What recent interventions/DTPs have been made by any provider to improve Blood Pressure control since last CPP Visit: Patient states she has been taking her medication as directed.  . Any recent hospitalizations or ED visits since last visit with CPP? No  . What diet changes have been made to improve Blood Pressure Control?  o Patient is watching his salt intake o  . What exercise is being done to improve your Blood Pressure Control?  o None, states he has a bad back , going to mail box is a problem.   Adherence Review: Is the patient currently on ACE/ARB medication? Yes  Does the patient have >5 day gap between last estimated fill dates? No   Follow-Up:  Pharmacist Review - Patient states he does not like calls where people ask him questions that they should already know, exercise/ diet / etc. Patient is have cataract surgery on right eye tomorrow .  Daron Offer ,CPP Notified  Judithann Sheen, [facility] Clinical Pharmacist Assistant [phone]

## 2020-05-06 NOTE — Discharge Instructions (Signed)

## 2020-05-07 ENCOUNTER — Other Ambulatory Visit: Payer: Self-pay

## 2020-05-07 ENCOUNTER — Other Ambulatory Visit
Admission: RE | Admit: 2020-05-07 | Discharge: 2020-05-07 | Disposition: A | Discharge status: Home / Self Care | Payer: PPO | Source: Ambulatory Visit | Attending: Ophthalmology | Admitting: Ophthalmology

## 2020-05-07 DIAGNOSIS — Z20822 Contact with and (suspected) exposure to covid-19: Secondary | ICD-10-CM | POA: Insufficient documentation

## 2020-05-07 DIAGNOSIS — Z01812 Encounter for preprocedural laboratory examination: Secondary | ICD-10-CM | POA: Insufficient documentation

## 2020-05-07 LAB — SARS CORONAVIRUS 2 (TAT 6-24 HRS): SARS Coronavirus 2: NEGATIVE

## 2020-05-11 ENCOUNTER — Ambulatory Visit: Payer: PPO | Admitting: Anesthesiology

## 2020-05-11 ENCOUNTER — Ambulatory Visit
Admission: RE | Admit: 2020-05-11 | Discharge: 2020-05-11 | Disposition: A | Discharge status: Home / Self Care | Payer: PPO | Attending: Ophthalmology | Admitting: Ophthalmology

## 2020-05-11 ENCOUNTER — Encounter
Admission: RE | Disposition: A | Discharge status: Home / Self Care | Payer: Self-pay | Source: Home / Self Care | Attending: Ophthalmology

## 2020-05-11 ENCOUNTER — Other Ambulatory Visit: Payer: Self-pay

## 2020-05-11 ENCOUNTER — Encounter: Payer: Self-pay | Admitting: Ophthalmology

## 2020-05-11 DIAGNOSIS — G4730 Sleep apnea, unspecified: Secondary | ICD-10-CM | POA: Diagnosis not present

## 2020-05-11 DIAGNOSIS — Z794 Long term (current) use of insulin: Secondary | ICD-10-CM | POA: Insufficient documentation

## 2020-05-11 DIAGNOSIS — H2511 Age-related nuclear cataract, right eye: Secondary | ICD-10-CM | POA: Diagnosis not present

## 2020-05-11 DIAGNOSIS — Z87891 Personal history of nicotine dependence: Secondary | ICD-10-CM | POA: Insufficient documentation

## 2020-05-11 DIAGNOSIS — Z7902 Long term (current) use of antithrombotics/antiplatelets: Secondary | ICD-10-CM | POA: Insufficient documentation

## 2020-05-11 DIAGNOSIS — Z79899 Other long term (current) drug therapy: Secondary | ICD-10-CM | POA: Insufficient documentation

## 2020-05-11 DIAGNOSIS — Z955 Presence of coronary angioplasty implant and graft: Secondary | ICD-10-CM | POA: Insufficient documentation

## 2020-05-11 DIAGNOSIS — E1136 Type 2 diabetes mellitus with diabetic cataract: Secondary | ICD-10-CM | POA: Insufficient documentation

## 2020-05-11 DIAGNOSIS — Z85828 Personal history of other malignant neoplasm of skin: Secondary | ICD-10-CM | POA: Diagnosis not present

## 2020-05-11 DIAGNOSIS — H25811 Combined forms of age-related cataract, right eye: Secondary | ICD-10-CM | POA: Diagnosis not present

## 2020-05-11 DIAGNOSIS — Z7951 Long term (current) use of inhaled steroids: Secondary | ICD-10-CM | POA: Insufficient documentation

## 2020-05-11 DIAGNOSIS — Z7982 Long term (current) use of aspirin: Secondary | ICD-10-CM | POA: Diagnosis not present

## 2020-05-11 HISTORY — DX: Other chronic pain: G89.29

## 2020-05-11 HISTORY — DX: Low back pain, unspecified: M54.50

## 2020-05-11 HISTORY — DX: Unspecified asthma, uncomplicated: J45.909

## 2020-05-11 HISTORY — DX: Hypothyroidism, unspecified: E03.9

## 2020-05-11 HISTORY — DX: Heart failure, unspecified: I50.9

## 2020-05-11 HISTORY — DX: Type 2 diabetes mellitus without complications: E11.9

## 2020-05-11 HISTORY — DX: Other complications of anesthesia, initial encounter: T88.59XA

## 2020-05-11 HISTORY — DX: Polyneuropathy, unspecified: G62.9

## 2020-05-11 HISTORY — PX: CATARACT EXTRACTION W/PHACO: SHX586

## 2020-05-11 LAB — GLUCOSE, CAPILLARY
Glucose-Capillary: 126 mg/dL — ABNORMAL HIGH (ref 70–99)
Glucose-Capillary: 123 mg/dL — ABNORMAL HIGH (ref 70–99)

## 2020-05-11 SURGERY — PHACOEMULSIFICATION, CATARACT, WITH IOL INSERTION
Anesthesia: Monitor Anesthesia Care | Site: Eye | Laterality: Right

## 2020-05-11 MED ORDER — OXYCODONE HCL 5 MG/5ML PO SOLN: 5.0000 mg | Freq: Once | ORAL | Status: DC | PRN

## 2020-05-11 MED ORDER — OXYCODONE HCL 5 MG PO TABS: 5.0000 mg | ORAL_TABLET | Freq: Once | ORAL | Status: DC | PRN

## 2020-05-11 MED ORDER — ARMC OPHTHALMIC DILATING DROPS
1.0000 "application " | OPHTHALMIC | Status: DC | PRN
  Administered 2020-05-11 (×3): 1 via OPHTHALMIC

## 2020-05-11 MED ORDER — BRIMONIDINE TARTRATE-TIMOLOL 0.2-0.5 % OP SOLN
OPHTHALMIC | Status: DC | PRN
  Administered 2020-05-11: 1 [drp] via OPHTHALMIC

## 2020-05-11 MED ORDER — EPINEPHRINE PF 1 MG/ML IJ SOLN
INTRAOCULAR | Status: DC | PRN
  Administered 2020-05-11: 74 mL via OPHTHALMIC

## 2020-05-11 MED ORDER — CEFUROXIME OPHTHALMIC INJECTION 1 MG/0.1 ML
INJECTION | OPHTHALMIC | Status: DC | PRN
  Administered 2020-05-11: 0.1 mL via INTRACAMERAL

## 2020-05-11 MED ORDER — NA HYALUR & NA CHOND-NA HYALUR 0.4-0.35 ML IO KIT
PACK | INTRAOCULAR | Status: DC | PRN
  Administered 2020-05-11: 1 mL via INTRAOCULAR

## 2020-05-11 MED ORDER — TETRACAINE HCL 0.5 % OP SOLN
1.0000 [drp] | OPHTHALMIC | Status: DC | PRN
  Administered 2020-05-11 (×3): 1 [drp] via OPHTHALMIC

## 2020-05-11 MED ORDER — LIDOCAINE HCL (PF) 2 % IJ SOLN
INTRAOCULAR | Status: DC | PRN
  Administered 2020-05-11: 1 mL

## 2020-05-11 MED ORDER — FENTANYL CITRATE (PF) 100 MCG/2ML IJ SOLN
INTRAMUSCULAR | Status: DC | PRN
  Administered 2020-05-11: 25 ug via INTRAVENOUS
  Administered 2020-05-11: 50 ug via INTRAVENOUS
  Administered 2020-05-11: 25 ug via INTRAVENOUS

## 2020-05-11 MED ORDER — MIDAZOLAM HCL 2 MG/2ML IJ SOLN
INTRAMUSCULAR | Status: DC | PRN
  Administered 2020-05-11: 2 mg via INTRAVENOUS

## 2020-05-11 SURGICAL SUPPLY — 23 items
CANNULA ANT/CHMB 27G (MISCELLANEOUS) ×1 IMPLANT
CANNULA ANT/CHMB 27GA (MISCELLANEOUS) ×2 IMPLANT
GLOVE SURG LX 7.5 STRW (GLOVE) ×1
GLOVE SURG LX STRL 7.5 STRW (GLOVE) ×1 IMPLANT
GLOVE SURG TRIUMPH 8.0 PF LTX (GLOVE) ×2 IMPLANT
GOWN STRL REUS W/ TWL LRG LVL3 (GOWN DISPOSABLE) ×2 IMPLANT
GOWN STRL REUS W/TWL LRG LVL3 (GOWN DISPOSABLE) ×4
LENS IOL DIOP 21.5 (Intraocular Lens) ×2 IMPLANT
LENS IOL TECNIS MONO 21.5 (Intraocular Lens) IMPLANT
MARKER SKIN DUAL TIP RULER LAB (MISCELLANEOUS) ×2 IMPLANT
NDL CAPSULORHEX 25GA (NEEDLE) ×1 IMPLANT
NDL FILTER BLUNT 18X1 1/2 (NEEDLE) ×2 IMPLANT
NEEDLE CAPSULORHEX 25GA (NEEDLE) ×2 IMPLANT
NEEDLE FILTER BLUNT 18X 1/2SAF (NEEDLE) ×2
NEEDLE FILTER BLUNT 18X1 1/2 (NEEDLE) ×2 IMPLANT
PACK CATARACT BRASINGTON (MISCELLANEOUS) ×2 IMPLANT
PACK EYE AFTER SURG (MISCELLANEOUS) ×2 IMPLANT
PACK OPTHALMIC (MISCELLANEOUS) ×2 IMPLANT
SOLUTION OPHTHALMIC SALT (MISCELLANEOUS) ×2 IMPLANT
SYR 3ML LL SCALE MARK (SYRINGE) ×4 IMPLANT
SYR TB 1ML LUER SLIP (SYRINGE) ×2 IMPLANT
WATER STERILE IRR 250ML POUR (IV SOLUTION) ×2 IMPLANT
WIPE NON LINTING 3.25X3.25 (MISCELLANEOUS) ×2 IMPLANT

## 2020-05-11 NOTE — H&P (Signed)

## 2020-05-11 NOTE — Anesthesia Procedure Notes (Signed)
Procedure Name: MAC Date/Time: 05/11/2020 9:41 AM Performed by: Jeannene Patella, CRNA Pre-anesthesia Checklist: Patient identified, Emergency Drugs available, Suction available, Timeout performed and Patient being monitored Patient Re-evaluated:Patient Re-evaluated prior to induction Oxygen Delivery Method: Nasal cannula Placement Confirmation: positive ETCO2

## 2020-05-11 NOTE — Anesthesia Preprocedure Evaluation (Signed)
Anesthesia Evaluation  Patient identified by MRN, date of birth, ID band Patient awake    Reviewed: Allergy & Precautions, H&P , NPO status , Patient's Chart, lab work & pertinent test results  History of Anesthesia Complications Negative for: history of anesthetic complications  Airway Mallampati: II  TM Distance: >3 FB Neck ROM: full    Dental no notable dental hx.    Pulmonary asthma , sleep apnea and Continuous Positive Airway Pressure Ventilation , former smoker,    Pulmonary exam normal        Cardiovascular hypertension, On Medications + CAD and + Cardiac Stents  Normal cardiovascular exam Rhythm:regular Rate:Normal     Neuro/Psych    GI/Hepatic Neg liver ROS, Medicated,  Endo/Other  diabetes, Well Controlled, Type 2, Insulin DependentHypothyroidism   Renal/GU      Musculoskeletal   Abdominal   Peds  Hematology negative hematology ROS (+)   Anesthesia Other Findings   Reproductive/Obstetrics                             Anesthesia Physical Anesthesia Plan  ASA: III  Anesthesia Plan: MAC   Post-op Pain Management:    Induction:   PONV Risk Score and Plan: 1 and Treatment may vary due to age or medical condition  Airway Management Planned:   Additional Equipment:   Intra-op Plan:   Post-operative Plan:   Informed Consent: I have reviewed the patients History and Physical, chart, labs and discussed the procedure including the risks, benefits and alternatives for the proposed anesthesia with the patient or authorized representative who has indicated his/her understanding and acceptance.       Plan Discussed with:   Anesthesia Plan Comments:         Anesthesia Quick Evaluation

## 2020-05-11 NOTE — Transfer of Care (Signed)
Immediate Anesthesia Transfer of Care Note  Patient: Jacob Herrera  Procedure(s) Performed: CATARACT EXTRACTION PHACO AND INTRAOCULAR LENS PLACEMENT (IOC) RIGHT DIABETIC 6.52 01:11.6 9.1% (Right Eye)  Patient Location: PACU  Anesthesia Type: MAC  Level of Consciousness: awake, alert  and patient cooperative  Airway and Oxygen Therapy: Patient Spontanous Breathing and Patient connected to supplemental oxygen  Post-op Assessment: Post-op Vital signs reviewed, Patient's Cardiovascular Status Stable, Respiratory Function Stable, Patent Airway and No signs of Nausea or vomiting  Post-op Vital Signs: Reviewed and stable  Complications: No complications documented.

## 2020-05-11 NOTE — Op Note (Signed)
LOCATION:  [facility]   PREOPERATIVE DIAGNOSIS:    Nuclear sclerotic cataract right eye. H25.11   POSTOPERATIVE DIAGNOSIS:  Nuclear sclerotic cataract right eye.     PROCEDURE:  Phacoemusification with posterior chamber intraocular lens placement of the right eye   ULTRASOUND TIME: Procedure(s) with comments: CATARACT EXTRACTION PHACO AND INTRAOCULAR LENS PLACEMENT (IOC) RIGHT DIABETIC 6.52 01:11.6 9.1% (Right) - Diabetic - insulin sleep apnea  LENS:   Implant Name Type Inv. Item Serial No. Manufacturer Lot No. LRB No. Used Action  LENS IOL DIOP 21.5 - [id number] Intraocular Lens LENS IOL DIOP 21.5 [id number] JOHNSON   Right 1 Implanted         SURGEON:  Wyonia Hough, MD   ANESTHESIA:  Topical with tetracaine drops and 2% Xylocaine jelly, augmented with 1% preservative-free intracameral lidocaine.    COMPLICATIONS:  None.   DESCRIPTION OF PROCEDURE:  The patient was identified in the holding room and transported to the operating room and placed in the supine position under the operating microscope.  The right eye was identified as the operative eye and it was prepped and draped in the usual sterile ophthalmic fashion.   A 1 millimeter clear-corneal paracentesis was made at the 12:00 position.  0.5 ml of preservative-free 1% lidocaine was injected into the anterior chamber. The anterior chamber was filled with Viscoat viscoelastic.  A 2.4 millimeter keratome was used to make a near-clear corneal incision at the 9:00 position.  A curvilinear capsulorrhexis was made with a cystotome and capsulorrhexis forceps.  Balanced salt solution was used to hydrodissect and hydrodelineate the nucleus.   Phacoemulsification was then used in stop and chop fashion to remove the lens nucleus and epinucleus.  The remaining cortex was then removed using the irrigation and aspiration handpiece. Provisc was then placed into the capsular bag to distend it for lens placement.  A lens was  then injected into the capsular bag.  The remaining viscoelastic was aspirated.   Wounds were hydrated with balanced salt solution.  The anterior chamber was inflated to a physiologic pressure with balanced salt solution.  No wound leaks were noted. Cefuroxime 0.1 ml of a 10mg /ml solution was injected into the anterior chamber for a dose of 1 mg of intracameral antibiotic at the completion of the case.   Timolol and Brimonidine drops were applied to the eye.  The patient was taken to the recovery room in stable condition without complications of anesthesia or surgery.   Saman Giddens 05/11/2020, 9:58 AM

## 2020-05-11 NOTE — Anesthesia Postprocedure Evaluation (Signed)
Anesthesia Post Note  Patient: Jacob Herrera  Procedure(s) Performed: CATARACT EXTRACTION PHACO AND INTRAOCULAR LENS PLACEMENT (IOC) RIGHT DIABETIC 6.52 01:11.6 9.1% (Right Eye)     Patient location during evaluation: PACU Anesthesia Type: MAC Level of consciousness: awake and alert Pain management: pain level controlled Vital Signs Assessment: post-procedure vital signs reviewed and stable Respiratory status: spontaneous breathing Cardiovascular status: stable Anesthetic complications: no   No complications documented.  Gillian Scarce

## 2020-05-12 ENCOUNTER — Encounter: Payer: Self-pay | Admitting: Ophthalmology

## 2020-05-14 ENCOUNTER — Other Ambulatory Visit: Payer: Self-pay | Admitting: Internal Medicine

## 2020-05-14 DIAGNOSIS — G4733 Obstructive sleep apnea (adult) (pediatric): Secondary | ICD-10-CM | POA: Diagnosis not present

## 2020-05-14 DIAGNOSIS — J45909 Unspecified asthma, uncomplicated: Secondary | ICD-10-CM | POA: Diagnosis not present

## 2020-05-17 DIAGNOSIS — H2512 Age-related nuclear cataract, left eye: Secondary | ICD-10-CM | POA: Diagnosis not present

## 2020-05-20 ENCOUNTER — Encounter: Payer: Self-pay | Admitting: Ophthalmology

## 2020-05-25 ENCOUNTER — Telehealth: Payer: Self-pay

## 2020-05-25 ENCOUNTER — Other Ambulatory Visit: Payer: Self-pay | Admitting: Family Medicine

## 2020-05-25 DIAGNOSIS — E1121 Type 2 diabetes mellitus with diabetic nephropathy: Secondary | ICD-10-CM

## 2020-05-25 NOTE — Chronic Care Management (AMB) (Addendum)
Chronic Care Management Pharmacy Assistant   Name: Jacob Herrera  MRN: [id number] DOB: [birth year]  Reason for Encounter: Patient Assistance Coordination  PCP : [name], MD   05/25/2020- Patient called inquiring on Trulicity PAP, he is running out of Trulicity that he gets from [employer] patient assistance program. Patient states he also get Basaglar and Humalog and was given plenty of those medication with last delivery in November but only given 12 pens of Trulicity. Patient aware I will get forms ready for Trulicity, Basaglar and Humalog for 2022. Patient had new social security income documentation and would like to come by on Friday to sign and bring document. Patient is wondering if Dr Caryn Section has any samples of Trulicity to hold him until PAP arrives. Patient aware I will contact [name], CPP to see if samples available and will call back.   Need clarification on strengths of medications Trulicity, Novolog or Humalog and Basaglar. Left message for patient to return call.   05/26/2020- Patient called back to clarify that he uses 16 units of Humalog, 1.5 mg weekly of Trulicity and 50 units of Basaglar. PAP forms updated and sent to [name], CPP.    Allergies:   Allergies  Allergen Reactions   Chlorthalidone     D/c by dr. Juleen China for worsening renal function 03/2019   Spironolactone     gynecomastia    Medications: Outpatient Encounter Medications as of 05/25/2020  Medication Sig Note   albuterol (PROVENTIL) (2.5 MG/3ML) 0.083% nebulizer solution Take 3 mLs (2.5 mg total) by nebulization every 4 (four) hours. And PRN (Patient taking differently: Take 2.5 mg by nebulization as needed.) 05/11/2020: PRN, hasnt had to use in 2-3 months   amLODipine (NORVASC) 10 MG tablet Take 1 tablet (10 mg total) by mouth daily.    aspirin 81 MG tablet Take 81 mg by mouth daily.    atorvastatin (LIPITOR) 80 MG tablet TAKE ONE TABLET AT BEDTIME    carvedilol (COREG) 25 MG tablet  TAKE 1 TABLET BY MOUTH TWICE DAILY    Cholecalciferol (VITAMIN D3 PO) Take 1,000 Units by mouth daily.     cloNIDine (CATAPRES) 0.2 MG tablet TAKE ONE TABLET BY MOUTH TWICE DAILY    clopidogrel (PLAVIX) 75 MG tablet Take 1 tablet (75 mg total) by mouth daily.    Cyanocobalamin (VITAMIN B 12 PO) Take 1,000 mcg by mouth daily.     doxazosin (CARDURA) 8 MG tablet TAKE ONE TABLET BY MOUTH EVERY DAY -STOP FLOMAX-    Dulaglutide (TRULICITY [place]) Inject into the skin.    finasteride (PROSCAR) 5 MG tablet TAKE ONE TABLET EVERY DAY    fluticasone (FLONASE) 50 MCG/ACT nasal spray Place 2 sprays into both nostrils daily.    furosemide (LASIX) 40 MG tablet Take 40 mg by mouth 2 (two) times daily.     gabapentin (NEURONTIN) 600 MG tablet Take 1 tablet (600 mg total) by mouth 2 (two) times daily.    glucose blood (ONETOUCH ULTRA) test strip USE AS DIRECTED THREE TIMES A DAY    hydrALAZINE (APRESOLINE) 100 MG tablet TAKE ONE TABLET BY MOUTH 3 TIMES DAILY    insulin aspart (NOVOLOG FLEXPEN) 100 UNIT/ML FlexPen Take 16 units before lunch.    Insulin Glargine (BASAGLAR KWIKPEN) 100 UNIT/ML Inject into the skin daily. 05/11/2020: 18 units this AM     insulin glargine (LANTUS SOLOSTAR) 100 UNIT/ML Solostar Pen TITRATE UP TO 80 UNITS A DAY AS DIRECTED (Patient not taking: Reported  on 05/11/2020)    Insulin Pen Needle (BD PEN NEEDLE NANO U/F) 32G X 4 MM MISC USE DAILY AS DIRECTED    isosorbide mononitrate (IMDUR) 60 MG 24 hr tablet Take 1 tablet (60 mg total) by mouth daily.    levothyroxine (SYNTHROID) 125 MCG tablet TAKE 1 TABLET EVERY DAY ON EMPTY STOMACHWITH A GLASS OF WATER AT LEAST 30-60 MINBEFORE BREAKFAST    losartan (COZAAR) 100 MG tablet Take 1 tablet (100 mg total) by mouth daily.    Lysine 500 MG CAPS Take 1 capsule by mouth daily.    oxyCODONE (OXY IR/ROXICODONE) 5 MG immediate release tablet Take 1 tablet (5 mg total) by mouth daily as needed for severe pain. (Patient not taking: Reported on 05/05/2020)     SENNA PO Take 1 capsule by mouth in the morning, at noon, in the evening, and at bedtime.    SYMBICORT 80-4.5 MCG/ACT inhaler INHALE 2 PUFFS INTO LUNGS TWICE DAILY    No facility-administered encounter medications on file as of 05/25/2020.    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   Primary osteoarthritis of first carpometacarpal joint of right hand 07/30/2016   Coronary artery disease with unstable angina pectoris ([place]) 14/23/9532   Diastolic heart failure ([place]) 02/15/2015   Regional wall motion abnormality of heart 02/15/2015   Abnormal findings on diagnostic imaging of heart and coronary circulation 02/15/2015   Restrictive lung disease 11/10/2014   History of adenomatous polyp of colon 11/09/2014   Hypothyroidism 11/09/2014   Diabetes mellitus with nephropathy ([place]) 11/09/2014   Hyperlipidemia 11/09/2014   Depression 11/09/2014   Insomnia 11/09/2014   Resistant hypertension 11/09/2014   GERD (gastroesophageal reflux disease) 11/09/2014   Chronic kidney disease (CKD), stage III (moderate) ([place]) 11/09/2014   Dyspnea on exertion 11/09/2014   History of basal cell cancer 09/15/2014   DDD (degenerative disc disease), lumbar 09/09/2014   Facet syndrome, lumbar 09/09/2014   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   Greater trochanteric bursitis of both hips 09/09/2014   Allergic rhinitis 08/12/2014   Benign prostatic hyperplasia with urinary obstruction 06/24/2013   Nocturia 05/30/2013   Frequency of micturition 05/30/2013   Urge incontinence 05/30/2013   Asthma, chronic 05/29/2013   Morbid obesity ([place]) 05/29/2013   OSA treated with BiPAP 05/29/2013   Scoliosis 11/28/2011   SCC (squamous cell carcinoma), face 08/01/2011     Follow-Up:   Patient Assistance [place], [place] Pharmacist Assistant [phone]   Addendum 06/09/19: Jacob Herrera [employer] Patient Assistance form for Humalog, Trulicity, and Basaglar completed by patient and faxed for review on 06/08/20

## 2020-05-25 NOTE — Telephone Encounter (Signed)
Medication Refill - Medication: Trulicity   Has the patient contacted their pharmacy? Yes.   Pharmacy called stating that pt was under the impression that this was sent in already. Please advise.  (Agent: If no, request that the patient contact the pharmacy for the refill.) (Agent: If yes, when and what did the pharmacy advise?)  Preferred Pharmacy (with phone number or street name):  [place], [place] - [address]  [place] [address]  Phone: [phone] Fax: [phone]  Hours: Not open 24 hours     Agent: Please be advised that RX refills may take up to 3 business days. We ask that you follow-up with your pharmacy.

## 2020-05-25 NOTE — Telephone Encounter (Signed)
Please write script for the Trulicity.

## 2020-05-25 NOTE — Telephone Encounter (Signed)
Copied from [place] [id number]. Topic: General - Other >> May 25, 2020  1:59 PM Hinda Lenis D wrote: Pt need a callback from [facility] pharmacist to go over this medication //  Dulaglutide ([place]) [[id number]]  please advise

## 2020-05-26 MED ORDER — TRULICITY 1.5 MG/0.5ML ~~LOC~~ SOAJ: 1.5000 mg | SUBCUTANEOUS | 12 refills | Status: DC

## 2020-05-26 NOTE — Telephone Encounter (Signed)
Please review. Thanks!  

## 2020-05-31 ENCOUNTER — Other Ambulatory Visit
Admission: RE | Admit: 2020-05-31 | Discharge: 2020-05-31 | Disposition: A | Discharge status: Home / Self Care | Payer: PPO | Source: Ambulatory Visit | Attending: Ophthalmology | Admitting: Ophthalmology

## 2020-05-31 ENCOUNTER — Other Ambulatory Visit: Payer: Self-pay

## 2020-05-31 DIAGNOSIS — Z01812 Encounter for preprocedural laboratory examination: Secondary | ICD-10-CM | POA: Insufficient documentation

## 2020-05-31 DIAGNOSIS — Z20822 Contact with and (suspected) exposure to covid-19: Secondary | ICD-10-CM | POA: Diagnosis not present

## 2020-05-31 LAB — SARS CORONAVIRUS 2 (TAT 6-24 HRS): SARS Coronavirus 2: NEGATIVE

## 2020-05-31 NOTE — Discharge Instructions (Signed)

## 2020-06-02 ENCOUNTER — Ambulatory Visit
Admission: RE | Admit: 2020-06-02 | Discharge: 2020-06-02 | Disposition: A | Discharge status: Home / Self Care | Payer: PPO | Attending: Ophthalmology | Admitting: Ophthalmology

## 2020-06-02 ENCOUNTER — Encounter: Payer: Self-pay | Admitting: Ophthalmology

## 2020-06-02 ENCOUNTER — Encounter
Admission: RE | Disposition: A | Discharge status: Home / Self Care | Payer: Self-pay | Source: Home / Self Care | Attending: Ophthalmology

## 2020-06-02 ENCOUNTER — Other Ambulatory Visit: Payer: Self-pay

## 2020-06-02 ENCOUNTER — Ambulatory Visit: Payer: PPO | Admitting: Anesthesiology

## 2020-06-02 DIAGNOSIS — H2512 Age-related nuclear cataract, left eye: Secondary | ICD-10-CM | POA: Insufficient documentation

## 2020-06-02 DIAGNOSIS — Z79899 Other long term (current) drug therapy: Secondary | ICD-10-CM | POA: Diagnosis not present

## 2020-06-02 DIAGNOSIS — E1136 Type 2 diabetes mellitus with diabetic cataract: Secondary | ICD-10-CM | POA: Insufficient documentation

## 2020-06-02 DIAGNOSIS — Z7982 Long term (current) use of aspirin: Secondary | ICD-10-CM | POA: Diagnosis not present

## 2020-06-02 DIAGNOSIS — Z7951 Long term (current) use of inhaled steroids: Secondary | ICD-10-CM | POA: Diagnosis not present

## 2020-06-02 DIAGNOSIS — Z794 Long term (current) use of insulin: Secondary | ICD-10-CM | POA: Diagnosis not present

## 2020-06-02 DIAGNOSIS — H25812 Combined forms of age-related cataract, left eye: Secondary | ICD-10-CM | POA: Diagnosis not present

## 2020-06-02 DIAGNOSIS — Z7902 Long term (current) use of antithrombotics/antiplatelets: Secondary | ICD-10-CM | POA: Insufficient documentation

## 2020-06-02 HISTORY — PX: CATARACT EXTRACTION W/PHACO: SHX586

## 2020-06-02 LAB — GLUCOSE, CAPILLARY
Glucose-Capillary: 113 mg/dL — ABNORMAL HIGH (ref 70–99)
Glucose-Capillary: 108 mg/dL — ABNORMAL HIGH (ref 70–99)

## 2020-06-02 SURGERY — PHACOEMULSIFICATION, CATARACT, WITH IOL INSERTION
Anesthesia: Monitor Anesthesia Care | Site: Eye | Laterality: Left

## 2020-06-02 MED ORDER — BRIMONIDINE TARTRATE-TIMOLOL 0.2-0.5 % OP SOLN
OPHTHALMIC | Status: DC | PRN
  Administered 2020-06-02: 1 [drp] via OPHTHALMIC

## 2020-06-02 MED ORDER — MIDAZOLAM HCL 2 MG/2ML IJ SOLN
INTRAMUSCULAR | Status: DC | PRN
  Administered 2020-06-02 (×2): 1 mg via INTRAVENOUS

## 2020-06-02 MED ORDER — NA HYALUR & NA CHOND-NA HYALUR 0.4-0.35 ML IO KIT
PACK | INTRAOCULAR | Status: DC | PRN
  Administered 2020-06-02: 1 mL via INTRAOCULAR

## 2020-06-02 MED ORDER — ONDANSETRON HCL 4 MG/2ML IJ SOLN: 4.0000 mg | Freq: Once | INTRAMUSCULAR | Status: DC | PRN

## 2020-06-02 MED ORDER — FENTANYL CITRATE (PF) 100 MCG/2ML IJ SOLN
INTRAMUSCULAR | Status: DC | PRN
  Administered 2020-06-02: 50 ug via INTRAVENOUS

## 2020-06-02 MED ORDER — ACETAMINOPHEN 325 MG PO TABS: 325.0000 mg | ORAL_TABLET | ORAL | Status: DC | PRN

## 2020-06-02 MED ORDER — LACTATED RINGERS IV SOLN: INTRAVENOUS | Status: DC

## 2020-06-02 MED ORDER — LIDOCAINE HCL (PF) 2 % IJ SOLN
INTRAOCULAR | Status: DC | PRN
  Administered 2020-06-02: 1 mL

## 2020-06-02 MED ORDER — CEFUROXIME OPHTHALMIC INJECTION 1 MG/0.1 ML
INJECTION | OPHTHALMIC | Status: DC | PRN
  Administered 2020-06-02: 0.1 mL via INTRACAMERAL

## 2020-06-02 MED ORDER — EPINEPHRINE PF 1 MG/ML IJ SOLN
INTRAOCULAR | Status: DC | PRN
  Administered 2020-06-02: 77 mL via OPHTHALMIC

## 2020-06-02 MED ORDER — ARMC OPHTHALMIC DILATING DROPS
1.0000 "application " | OPHTHALMIC | Status: DC | PRN
  Administered 2020-06-02 (×3): 1 via OPHTHALMIC

## 2020-06-02 MED ORDER — ACETAMINOPHEN 160 MG/5ML PO SOLN: 325.0000 mg | ORAL | Status: DC | PRN

## 2020-06-02 MED ORDER — TETRACAINE HCL 0.5 % OP SOLN
1.0000 [drp] | OPHTHALMIC | Status: DC | PRN
  Administered 2020-06-02 (×3): 1 [drp] via OPHTHALMIC

## 2020-06-02 SURGICAL SUPPLY — 29 items
CANNULA ANT/CHMB 27G (MISCELLANEOUS) ×1 IMPLANT
CANNULA ANT/CHMB 27GA (MISCELLANEOUS) ×2 IMPLANT
GLOVE SURG LX 7.5 STRW (GLOVE) ×2
GLOVE SURG LX STRL 7.5 STRW (GLOVE) ×1 IMPLANT
GLOVE SURG TRIUMPH 8.0 PF LTX (GLOVE) ×2 IMPLANT
GOWN STRL REUS W/ TWL LRG LVL3 (GOWN DISPOSABLE) ×2 IMPLANT
GOWN STRL REUS W/TWL LRG LVL3 (GOWN DISPOSABLE) ×4
LENS IOL DIOP 22.0 (Intraocular Lens) ×2 IMPLANT
LENS IOL TECNIS MONO 22.0 (Intraocular Lens) IMPLANT
MARKER SKIN DUAL TIP RULER LAB (MISCELLANEOUS) ×2 IMPLANT
NDL CAPSULORHEX 25GA (NEEDLE) ×1 IMPLANT
NDL FILTER BLUNT 18X1 1/2 (NEEDLE) ×2 IMPLANT
NDL RETROBULBAR .5 NSTRL (NEEDLE) IMPLANT
NEEDLE CAPSULORHEX 25GA (NEEDLE) ×2 IMPLANT
NEEDLE FILTER BLUNT 18X 1/2SAF (NEEDLE) ×2
NEEDLE FILTER BLUNT 18X1 1/2 (NEEDLE) ×2 IMPLANT
PACK CATARACT BRASINGTON (MISCELLANEOUS) ×2 IMPLANT
PACK EYE AFTER SURG (MISCELLANEOUS) ×2 IMPLANT
PACK OPTHALMIC (MISCELLANEOUS) ×2 IMPLANT
RING MALYGIN 7.0 (MISCELLANEOUS) IMPLANT
SOLUTION OPHTHALMIC SALT (MISCELLANEOUS) ×2 IMPLANT
SUT ETHILON 10-0 CS-B-6CS-B-6 (SUTURE)
SUT VICRYL  9 0 (SUTURE)
SUT VICRYL 9 0 (SUTURE) IMPLANT
SUTURE EHLN 10-0 CS-B-6CS-B-6 (SUTURE) IMPLANT
SYR 3ML LL SCALE MARK (SYRINGE) ×4 IMPLANT
SYR TB 1ML LUER SLIP (SYRINGE) ×2 IMPLANT
WATER STERILE IRR 250ML POUR (IV SOLUTION) ×2 IMPLANT
WIPE NON LINTING 3.25X3.25 (MISCELLANEOUS) ×2 IMPLANT

## 2020-06-02 NOTE — H&P (Signed)

## 2020-06-02 NOTE — Anesthesia Postprocedure Evaluation (Addendum)
Anesthesia Post Note  Patient: Jacob Herrera  Procedure(s) Performed: CATARACT EXTRACTION PHACO AND INTRAOCULAR LENS PLACEMENT (IOC) LEFT DIABETIC (Left Eye)     Patient location during evaluation: PACU Anesthesia Type: MAC Level of consciousness: awake and alert Pain management: pain level controlled Vital Signs Assessment: post-procedure vital signs reviewed and stable Respiratory status: spontaneous breathing, nonlabored ventilation, respiratory function stable and patient connected to nasal cannula oxygen Cardiovascular status: blood pressure returned to baseline and stable Postop Assessment: no apparent nausea or vomiting Anesthetic complications: no   No complications documented.  Sinda Du

## 2020-06-02 NOTE — Anesthesia Preprocedure Evaluation (Signed)
Anesthesia Evaluation  Patient identified by MRN, date of birth, ID band Patient awake    Reviewed: Allergy & Precautions, H&P , NPO status , Patient's Chart, lab work & pertinent test results  History of Anesthesia Complications Negative for: history of anesthetic complications  Airway Mallampati: II  TM Distance: >3 FB Neck ROM: full    Dental no notable dental hx.    Pulmonary asthma , sleep apnea and Continuous Positive Airway Pressure Ventilation , former smoker,    Pulmonary exam normal breath sounds clear to auscultation       Cardiovascular Exercise Tolerance: Good hypertension, On Medications + angina + CAD, + Cardiac Stents and +CHF  Normal cardiovascular exam Rhythm:regular Rate:Normal     Neuro/Psych PSYCHIATRIC DISORDERS Depression  Neuromuscular disease    GI/Hepatic Neg liver ROS, GERD  ,  Endo/Other  diabetes, Well Controlled, Type 2, Insulin DependentHypothyroidism   Renal/GU Renal disease     Musculoskeletal  (+) Arthritis ,   Abdominal Normal abdominal exam  (+) - obese,  Abdomen: soft.    Peds  Hematology negative hematology ROS (+)   Anesthesia Other Findings   Reproductive/Obstetrics                             Anesthesia Physical  Anesthesia Plan  ASA: III  Anesthesia Plan: MAC   Post-op Pain Management:    Induction:   PONV Risk Score and Plan: 1 and Treatment may vary due to age or medical condition  Airway Management Planned: Natural Airway and Nasal Cannula  Additional Equipment:   Intra-op Plan:   Post-operative Plan:   Informed Consent: I have reviewed the patients History and Physical, chart, labs and discussed the procedure including the risks, benefits and alternatives for the proposed anesthesia with the patient or authorized representative who has indicated his/her understanding and acceptance.       Plan Discussed with: CRNA and  Surgeon  Anesthesia Plan Comments:         Anesthesia Quick Evaluation Patient Active Problem List   Diagnosis Date Noted  . Vitamin D deficiency 05/27/2019  . Vitamin B12 deficiency 05/27/2019  . Trigger middle finger of right hand 01/20/2019  . Carpal tunnel syndrome, right 01/20/2019  . 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 Primary osteoarthritis of first carpometacarpal joint of right hand 07/30/2016  . Coronary artery disease with unstable angina pectoris ([place]) 03/31/2015  . Diastolic heart failure ([place]) 02/15/2015  . Regional wall motion abnormality of heart 02/15/2015  . Abnormal findings on diagnostic imaging of heart and coronary circulation 02/15/2015  . Restrictive lung disease 11/10/2014  . History of adenomatous polyp of colon 11/09/2014  . Hypothyroidism 11/09/2014  . Diabetes mellitus with nephropathy ([place]) 11/09/2014  . Hyperlipidemia 11/09/2014  . Depression 11/09/2014  . Insomnia 11/09/2014  . Resistant hypertension 11/09/2014  . GERD (gastroesophageal reflux disease) 11/09/2014  . Chronic kidney disease (CKD), stage III (moderate) ([place]) 11/09/2014  . Dyspnea on exertion 11/09/2014  . History of basal cell cancer 09/15/2014  . DDD (degenerative disc disease), lumbar 09/09/2014  . Facet syndrome, lumbar 09/09/2014  . 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 Greater trochanteric bursitis of both hips 09/09/2014  . Allergic rhinitis 08/12/2014  . Benign prostatic hyperplasia with urinary obstruction 06/24/2013  . Nocturia 05/30/2013  . Frequency of micturition 05/30/2013  . Urge incontinence 05/30/2013  . Asthma, chronic 05/29/2013  . Morbid obesity ([place]) 05/29/2013  . OSA treated with BiPAP 05/29/2013  .  Scoliosis 11/28/2011  . SCC (squamous cell carcinoma), face 08/01/2011    CBC Latest Ref Rng & Units 11/09/2014 07/08/2013 07/07/2013  WBC 3.4 - 10.8 x10E3/uL 6.6 8.0 9.6  Hemoglobin 12.6 - 17.7 g/dL 12.4(L) 13.5 14.0  Hematocrit 37.5 - 51.0  % 38.9 41.7 43.3  Platelets 150 - 379 x10E3/uL 241 220 219   BMP Latest Ref Rng & Units 10/07/2018 12/21/2016 04/01/2015  Glucose 65 - 99 mg/dL 134(H) 70 152(H)  BUN 8 - 27 mg/dL 33(H) 25 21(H)  Creatinine 0.76 - 1.27 mg/dL 1.85(H) 1.53(H) 1.38(H)  BUN/Creat Ratio 10 - _0 -  Sodium 134 - 144 mmol/L 137 139 137  Potassium 3.5 - 5.2 mmol/L 3.8 4.7 3.9  Chloride 96 - 106 mmol/L 99 103 99(L)  CO2 20 - 29 mmol/L 22 18(L) 30  Calcium 8.6 - 10.2 mg/dL 10.0 9.6 9.3    Risks and benefits of anesthesia discussed at length, patient or surrogate demonstrates understanding. Appropriately NPO. Plan to proceed with anesthesia.  Champ Mungo, MD 06/02/20

## 2020-06-02 NOTE — Op Note (Signed)
OPERATIVE NOTE  Samik Balkcom [id number] 06/02/2020   PREOPERATIVE DIAGNOSIS:  Nuclear sclerotic cataract left eye. H25.12   POSTOPERATIVE DIAGNOSIS:    Nuclear sclerotic cataract left eye.     PROCEDURE:  Phacoemusification with posterior chamber intraocular lens placement of the left eye  Ultrasound time: Procedure(s) with comments: CATARACT EXTRACTION PHACO AND INTRAOCULAR LENS PLACEMENT (IOC) LEFT DIABETIC (Left) - 3.14 0:45.0 7.0%  LENS:   Implant Name Type Inv. Item Serial No. Manufacturer Lot No. LRB No. Used Action  LENS IOL DIOP 22.0 - [id number] Intraocular Lens LENS IOL DIOP 22.0 [id number]   Left 1 Implanted      SURGEON:  Wyonia Hough, MD   ANESTHESIA:  Topical with tetracaine drops and 2% Xylocaine jelly, augmented with 1% preservative-free intracameral lidocaine.    COMPLICATIONS:  None.   DESCRIPTION OF PROCEDURE:  The patient was identified in the holding room and transported to the operating room and placed in the supine position under the operating microscope.  The left eye was identified as the operative eye and it was prepped and draped in the usual sterile ophthalmic fashion.   A 1 millimeter clear-corneal paracentesis was made at the 1:30 position.  0.5 ml of preservative-free 1% lidocaine was injected into the anterior chamber.  The anterior chamber was filled with Viscoat viscoelastic.  A 2.4 millimeter keratome was used to make a near-clear corneal incision at the 10:30 position.  .  A curvilinear capsulorrhexis was made with a cystotome and capsulorrhexis forceps.  Balanced salt solution was used to hydrodissect and hydrodelineate the nucleus.   Phacoemulsification was then used in stop and chop fashion to remove the lens nucleus and epinucleus.  The remaining cortex was then removed using the irrigation and aspiration handpiece. Provisc was then placed into the capsular bag to distend it for lens placement.  A lens was then injected  into the capsular bag.  The remaining viscoelastic was aspirated.   Wounds were hydrated with balanced salt solution.  The anterior chamber was inflated to a physiologic pressure with balanced salt solution.  No wound leaks were noted. Cefuroxime 0.1 ml of a 10mg /ml solution was injected into the anterior chamber for a dose of 1 mg of intracameral antibiotic at the completion of the case.   Timolol and Brimonidine drops were applied to the eye.  The patient was taken to the recovery room in stable condition without complications of anesthesia or surgery.  Jerid Catherman 06/02/2020, 11:45 AM

## 2020-06-02 NOTE — Transfer of Care (Signed)
Immediate Anesthesia Transfer of Care Note  Patient: Jacob Herrera  Procedure(s) Performed: CATARACT EXTRACTION PHACO AND INTRAOCULAR LENS PLACEMENT (IOC) LEFT DIABETIC (Left Eye)  Patient Location: PACU  Anesthesia Type: MAC  Level of Consciousness: awake, alert  and patient cooperative  Airway and Oxygen Therapy: Patient Spontanous Breathing   Post-op Assessment: Post-op Vital signs reviewed, Patient's Cardiovascular Status Stable, Respiratory Function Stable, Patent Airway and No signs of Nausea or vomiting  Post-op Vital Signs: Reviewed and stable  Complications: No complications documented.

## 2020-06-02 NOTE — Anesthesia Procedure Notes (Signed)
Procedure Name: MAC Date/Time: 06/02/2020 11:24 AM Performed by: Vanetta Shawl, CRNA Pre-anesthesia Checklist: Patient identified, Emergency Drugs available, Suction available, Timeout performed and Patient being monitored Patient Re-evaluated:Patient Re-evaluated prior to induction Oxygen Delivery Method: Nasal cannula Placement Confirmation: positive ETCO2

## 2020-06-16 DIAGNOSIS — J45909 Unspecified asthma, uncomplicated: Secondary | ICD-10-CM | POA: Diagnosis not present

## 2020-06-16 DIAGNOSIS — G4733 Obstructive sleep apnea (adult) (pediatric): Secondary | ICD-10-CM | POA: Diagnosis not present

## 2020-06-17 DIAGNOSIS — I25110 Atherosclerotic heart disease of native coronary artery with unstable angina pectoris: Secondary | ICD-10-CM | POA: Diagnosis not present

## 2020-06-17 DIAGNOSIS — G4733 Obstructive sleep apnea (adult) (pediatric): Secondary | ICD-10-CM | POA: Diagnosis not present

## 2020-06-17 DIAGNOSIS — R0609 Other forms of dyspnea: Secondary | ICD-10-CM | POA: Diagnosis not present

## 2020-06-17 DIAGNOSIS — Z794 Long term (current) use of insulin: Secondary | ICD-10-CM | POA: Diagnosis not present

## 2020-06-17 DIAGNOSIS — I5032 Chronic diastolic (congestive) heart failure: Secondary | ICD-10-CM | POA: Diagnosis not present

## 2020-06-17 DIAGNOSIS — Z6841 Body Mass Index (BMI) 40.0 and over, adult: Secondary | ICD-10-CM | POA: Diagnosis not present

## 2020-06-17 DIAGNOSIS — I10 Essential (primary) hypertension: Secondary | ICD-10-CM | POA: Diagnosis not present

## 2020-06-17 DIAGNOSIS — E782 Mixed hyperlipidemia: Secondary | ICD-10-CM | POA: Diagnosis not present

## 2020-06-17 DIAGNOSIS — E1121 Type 2 diabetes mellitus with diabetic nephropathy: Secondary | ICD-10-CM | POA: Diagnosis not present

## 2020-06-25 ENCOUNTER — Other Ambulatory Visit: Payer: Self-pay | Admitting: Family Medicine

## 2020-06-25 DIAGNOSIS — E785 Hyperlipidemia, unspecified: Secondary | ICD-10-CM

## 2020-06-25 DIAGNOSIS — I10 Essential (primary) hypertension: Secondary | ICD-10-CM

## 2020-06-26 NOTE — Telephone Encounter (Signed)
Requested medication (s) are due for refill today: yes  Requested medication (s) are on the active medication list: yes  Last refill:  04/03/20 #90  Future visit scheduled: yes  Notes to clinic:  overdue lab work   Requested Prescriptions  Pending Prescriptions Disp Refills   atorvastatin (LIPITOR) 80 MG tablet [Pharmacy Med Name: ATORVASTATIN CALCIUM 80 MG TAB] 90 tablet     Sig: TAKE ONE TABLET AT BEDTIME      Cardiovascular:  Antilipid - Statins Failed - 06/25/2020  5:15 PM      Failed - Total Cholesterol in normal range and within 360 days    Cholesterol, Total  Date Value Ref Range Status  05/26/2019 119 100 - 199 mg/dL Final          Failed - LDL in normal range and within 360 days    LDL Cholesterol (Calc)  Date Value Ref Range Status  03/20/2017 83 mg/dL (calc) Final    Comment:    Reference range: <100 . Desirable range <100 mg/dL for primary prevention;   <70 mg/dL for patients with CHD or diabetic patients  with > or = 2 CHD risk factors. [name] Kitchen LDL-C is now calculated using the Martin-Hopkins  calculation, which is a validated novel method providing  better accuracy than the Friedewald equation in the  estimation of LDL-C.  Cresenciano Genre et al. Annamaria Helling. 0175;102(58): 2061-2068  (http://education.[facility].com/faq/FAQ164)    LDL Chol Calc (NIH)  Date Value Ref Range Status  05/26/2019 46 0 - 99 mg/dL Final          Failed - HDL in normal range and within 360 days    HDL  Date Value Ref Range Status  05/26/2019 33 (L) >39 mg/dL Final          Failed - Triglycerides in normal range and within 360 days    Triglycerides  Date Value Ref Range Status  05/26/2019 252 (H) 0 - 149 mg/dL Final          Passed - Patient is not pregnant      Passed - Valid encounter within last 12 months    Recent Outpatient Visits           3 months ago Diabetes mellitus with nephropathy [facility])   [facility] [name], MD   8 months ago Diabetes  mellitus with nephropathy [facility])   [facility] [name], MD   1 year ago Diabetes mellitus with nephropathy [facility])   [facility] [name], MD   1 year ago Sore throat   [facility] Carles Collet M, [place]   1 year ago Resistant hypertension   [facility] [name], MD       Future Appointments             In 2 weeks [name], [name], MD [facility], [facility]   In 4 months Stoioff, Ronda Fairly, MD [facility]              Signed Prescriptions Disp Refills   cloNIDine (CATAPRES) 0.2 MG tablet 180 tablet 0    Sig: TAKE 1 TABLET BY MOUTH TWICE DAILY      Cardiovascular:  Alpha-2 Agonists Failed - 06/25/2020  5:15 PM      Failed - Last BP in normal range    BP Readings from Last 1 Encounters:  06/02/20 (!) 149/64          Passed -  Last Heart Rate in normal range    Pulse Readings from Last 1 Encounters:  06/02/20 (!) 86          Passed - Valid encounter within last 6 months    Recent Outpatient Visits           3 months ago Diabetes mellitus with nephropathy [facility])   [facility] [name], MD   8 months ago Diabetes mellitus with nephropathy [facility])   [facility] [name], MD   1 year ago Diabetes mellitus with nephropathy [facility])   [facility] [name], MD   1 year ago Sore throat   [facility] Trinna Post, [place]   1 year ago Resistant hypertension   [facility] [name], MD       Future Appointments             In 2 weeks [name], [name], MD [facility], [place]   In 4 months Beaver, Ronda Fairly, [place]

## 2020-06-26 NOTE — Telephone Encounter (Signed)
Requested Prescriptions  Pending Prescriptions Disp Refills  . cloNIDine (CATAPRES) 0.2 MG tablet [Pharmacy Med Name: CLONIDINE HCL 0.2 MG TAB] 180 tablet 0    Sig: TAKE 1 TABLET BY MOUTH TWICE DAILY     Cardiovascular:  Alpha-2 Agonists Failed - 06/25/2020  5:15 PM      Failed - Last BP in normal range    BP Readings from Last 1 Encounters:  06/02/20 (!) 149/64         Passed - Last Heart Rate in normal range    Pulse Readings from Last 1 Encounters:  06/02/20 (!) 21         Passed - Valid encounter within last 6 months    Recent Outpatient Visits          3 months ago Diabetes mellitus with nephropathy [facility])   [facility] [name], MD   8 months ago Diabetes mellitus with nephropathy [facility]   [facility] [name], MD   1 year ago Diabetes mellitus with nephropathy [facility])   [facility] [name], MD   1 year ago Sore throat   [facility] Trinna Post, [place]   1 year ago Resistant hypertension   [facility] [name], MD      Future Appointments            In 2 weeks [name], [name], MD [facility], [place]   In 4 months Stoioff, Ronda Fairly, MD [place]           . atorvastatin (LIPITOR) 80 MG tablet [Pharmacy Med Name: ATORVASTATIN CALCIUM 80 MG TAB] 90 tablet     Sig: TAKE ONE TABLET AT BEDTIME     Cardiovascular:  Antilipid - Statins Failed - 06/25/2020  5:15 PM      Failed - Total Cholesterol in normal range and within 360 days    Cholesterol, Total  Date Value Ref Range Status  05/26/2019 119 100 - 199 mg/dL Final         Failed - LDL in normal range and within 360 days    LDL Cholesterol (Calc)  Date Value Ref Range Status  03/20/2017 83 mg/dL (calc) Final    Comment:    Reference range: <100 . Desirable range <100 mg/dL for primary prevention;   <70 mg/dL for patients with CHD or diabetic patients  with > or = 2 CHD  risk factors. [name] Kitchen LDL-C is now calculated using the Martin-Hopkins  calculation, which is a validated novel method providing  better accuracy than the Friedewald equation in the  estimation of LDL-C.  Cresenciano Genre et al. Annamaria Helling. 2725;366(44): 2061-2068  (http://education.[facility].com/faq/FAQ164)    LDL Chol Calc (NIH)  Date Value Ref Range Status  05/26/2019 46 0 - 99 mg/dL Final         Failed - HDL in normal range and within 360 days    HDL  Date Value Ref Range Status  05/26/2019 33 (L) >39 mg/dL Final         Failed - Triglycerides in normal range and within 360 days    Triglycerides  Date Value Ref Range Status  05/26/2019 252 (H) 0 - 149 mg/dL Final         Passed - Patient is not pregnant      Passed - Valid encounter within last 12 months    Recent Outpatient Visits          3  months ago Diabetes mellitus with nephropathy [facility])   [facility] [name], MD   8 months ago Diabetes mellitus with nephropathy [facility])   [facility] [name], MD   1 year ago Diabetes mellitus with nephropathy [facility])   [facility] [name], [facility]   1 year ago Sore throat   [place], [place], [place]   1 year ago Resistant hypertension   [facility] [name], MD      Future Appointments            In 2 weeks [name], [name], MD [facility], [place]   In 4 months Grant, Ronda Fairly, MD [place]

## 2020-07-05 ENCOUNTER — Other Ambulatory Visit: Payer: Self-pay | Admitting: Family Medicine

## 2020-07-13 ENCOUNTER — Ambulatory Visit (INDEPENDENT_AMBULATORY_CARE_PROVIDER_SITE_OTHER): Payer: PPO | Admitting: Family Medicine

## 2020-07-13 ENCOUNTER — Ambulatory Visit (INDEPENDENT_AMBULATORY_CARE_PROVIDER_SITE_OTHER): Payer: PPO

## 2020-07-13 ENCOUNTER — Other Ambulatory Visit: Payer: Self-pay

## 2020-07-13 ENCOUNTER — Encounter: Payer: Self-pay | Admitting: Family Medicine

## 2020-07-13 VITALS — BP 150/73 | HR 58 | Resp 18 | Wt 273.0 lb

## 2020-07-13 DIAGNOSIS — E1169 Type 2 diabetes mellitus with other specified complication: Secondary | ICD-10-CM | POA: Diagnosis not present

## 2020-07-13 DIAGNOSIS — E1121 Type 2 diabetes mellitus with diabetic nephropathy: Secondary | ICD-10-CM

## 2020-07-13 DIAGNOSIS — I152 Hypertension secondary to endocrine disorders: Secondary | ICD-10-CM

## 2020-07-13 DIAGNOSIS — E785 Hyperlipidemia, unspecified: Secondary | ICD-10-CM

## 2020-07-13 DIAGNOSIS — N1832 Chronic kidney disease, stage 3b: Secondary | ICD-10-CM

## 2020-07-13 DIAGNOSIS — I5032 Chronic diastolic (congestive) heart failure: Secondary | ICD-10-CM

## 2020-07-13 DIAGNOSIS — E1122 Type 2 diabetes mellitus with diabetic chronic kidney disease: Secondary | ICD-10-CM

## 2020-07-13 DIAGNOSIS — E1159 Type 2 diabetes mellitus with other circulatory complications: Secondary | ICD-10-CM

## 2020-07-13 DIAGNOSIS — R29898 Other symptoms and signs involving the musculoskeletal system: Secondary | ICD-10-CM | POA: Diagnosis not present

## 2020-07-13 DIAGNOSIS — Z794 Long term (current) use of insulin: Secondary | ICD-10-CM | POA: Diagnosis not present

## 2020-07-13 DIAGNOSIS — M549 Dorsalgia, unspecified: Secondary | ICD-10-CM | POA: Diagnosis not present

## 2020-07-13 DIAGNOSIS — M79672 Pain in left foot: Secondary | ICD-10-CM | POA: Diagnosis not present

## 2020-07-13 DIAGNOSIS — M5126 Other intervertebral disc displacement, lumbar region: Secondary | ICD-10-CM

## 2020-07-13 DIAGNOSIS — M5136 Other intervertebral disc degeneration, lumbar region: Secondary | ICD-10-CM

## 2020-07-13 DIAGNOSIS — M51369 Other intervertebral disc degeneration, lumbar region without mention of lumbar back pain or lower extremity pain: Secondary | ICD-10-CM

## 2020-07-13 DIAGNOSIS — R200 Anesthesia of skin: Secondary | ICD-10-CM

## 2020-07-13 LAB — POCT GLYCOSYLATED HEMOGLOBIN (HGB A1C)
Est. average glucose Bld gHb Est-mCnc: 128
Hemoglobin A1C: 6.1 % — AB (ref 4.0–5.6)

## 2020-07-13 NOTE — Progress Notes (Signed)
Chronic Care Management Pharmacy Note  07/16/2020 Name:  Jacob Herrera MRN:  [id number] DOB:  January 13, 1951  Subjective: Jacob Herrera is an 70 y.o. year old male who is a primary patient of [name], [name], MD.  The CCM team was consulted for assistance with disease management and care coordination needs.    Engaged with patient face to face for follow up visit in response to provider referral for pharmacy case management and/or care coordination services.   Consent to Services:  The patient was given information about Chronic Care Management services, agreed to services, and gave verbal consent prior to initiation of services.  Please see initial visit note for detailed documentation.   Patient Care Team: Jacob Sons, MD as PCP - General (Family Medicine) Manya Silvas, MD (Inactive) as Consulting Physician (Gastroenterology) [name], MD as Consulting Physician (Nephrology) Ubaldo Glassing Javier Docker, MD as Consulting Physician (Cardiology) [name], MD as Referring Physician (Dermatology) [name], MD (Urology) [name], [facility] (Pharmacist)  Recent office visits: 03/15/20: Patient presented to Dr. Caryn Section for follow-up. BP 151/74. Ozempic switched to Trulicity   Recent consult visits: 06/17/20: Patient presented to Dr. Ubaldo Glassing (Cardiology) for follow-up. No medication changes made.   Hospital visits: 06/02/20: Patient hospitalized for cataract extraction.  05/11/20: Patient hospitalized for cataract extraction.   Objective:  Lab Results  Component Value Date   CREATININE 1.85 (H) 10/07/2018   BUN 33 (H) 10/07/2018   GFRNONAA 36 (L) 10/07/2018   GFRAA 42 (L) 10/07/2018   NA 137 10/07/2018   K 3.8 10/07/2018   CALCIUM 10.0 10/07/2018   CO2 22 10/07/2018   GLUCOSE 134 (H) 10/07/2018    Lab Results  Component Value Date/Time   HGBA1C 6.1 (A) 07/13/2020 01:56 PM   HGBA1C 6.7 (A) 03/15/2020 03:36 PM   HGBA1C 6.3 (H) 03/20/2017 03:03 PM    HGBA1C 8.4 (A) 09/08/2014 12:00 AM   MICROALBUR neg 03/20/2017 02:25 PM    Last diabetic Eye exam:  Lab Results  Component Value Date/Time   HMDIABEYEEXA No Retinopathy 03/29/2020 12:00 AM    Last diabetic Foot exam: No results found for: HMDIABFOOTEX   Lab Results  Component Value Date   CHOL 119 05/26/2019   HDL 33 (L) 05/26/2019   LDLCALC 46 05/26/2019   TRIG 252 (H) 05/26/2019   CHOLHDL 3.6 05/26/2019    Hepatic Function Latest Ref Rng & Units 10/07/2018 12/21/2016 07/04/2013  Total Protein 6.0 - 8.5 g/dL 6.6 7.0 7.5  Albumin 3.8 - 4.8 g/dL 4.2 4.3 3.3(L)  AST 0 - 40 IU/L 23 19 32  ALT 0 - 44 IU/L 17 21 34  Alk Phosphatase 39 - 117 IU/L 47 68 53  Total Bilirubin 0.0 - 1.2 mg/dL 0.2 0.2 0.4    Lab Results  Component Value Date/Time   TSH 2.720 05/26/2019 04:16 PM   TSH 3.480 12/21/2016 03:27 PM    CBC Latest Ref Rng & Units 11/09/2014 07/08/2013 07/07/2013  WBC 3.4 - 10.8 x10E3/uL 6.6 8.0 9.6  Hemoglobin 12.6 - 17.7 g/dL 12.4(L) 13.5 14.0  Hematocrit 37.5 - 51.0 % 38.9 41.7 43.3  Platelets 150 - 379 x10E3/uL 241 220 219    Lab Results  Component Value Date/Time   VD25OH 22.8 (L) 05/26/2019 04:16 PM    Clinical ASCVD: No  The ASCVD Risk score [name], et al., 2013) failed to calculate for the following reasons:   The valid total cholesterol range is 130 to 320 mg/dL  Depression screen [facility] 2/9 03/15/2020 04/30/2018 03/20/2017  Decreased Interest 0 3 0  Down, Depressed, Hopeless 0 0 0  PHQ - 2 Score 0 3 0  Altered sleeping 0 2 0  Tired, decreased energy 0 3 1  Change in appetite 0 0 0  Feeling bad or failure about yourself  0 0 1  Trouble concentrating 0 1 0  Moving slowly or fidgety/restless 0 0 0  Suicidal thoughts 0 0 0  PHQ-9 Score 0 9 2  Difficult doing work/chores Not difficult at all Not difficult at all Not difficult at all    Social History   Tobacco Use  Smoking Status Former Smoker  . Packs/day: 1.00  . Years: 25.00  . Pack years: 25.00   . Types: Cigarettes  . Quit date: 05/29/1988  . Years since quitting: 32.1  Smokeless Tobacco Current User  . Types: Chew  Tobacco Comment   chews 1 bag of loose leaf chew/week   BP Readings from Last 3 Encounters:  07/13/20 (!) 150/73  06/02/20 (!) 149/64  05/11/20 133/62   Pulse Readings from Last 3 Encounters:  07/13/20 (!) 58  06/02/20 (!) 58  05/11/20 63   Wt Readings from Last 3 Encounters:  07/13/20 273 lb (123.8 kg)  06/02/20 268 lb (121.6 kg)  05/11/20 268 lb (121.6 kg)   BMI Readings from Last 3 Encounters:  07/13/20 41.51 kg/m  06/02/20 40.75 kg/m  05/11/20 40.75 kg/m    Assessment/Interventions: Review of patient past medical history, allergies, medications, health status, including review of consultants reports, laboratory and other test data, was performed as part of comprehensive evaluation and provision of chronic care management services.   SDOH:  (Social Determinants of Health) assessments and interventions performed: Yes   CCM Care Plan  Allergies  Allergen Reactions  . Chlorthalidone     D/c by dr. Juleen China for worsening renal function 03/2019  . Spironolactone     gynecomastia    Medications Reviewed Today    Reviewed by Dettmer, Adron Bene (Registered Nurse) on 06/02/20 at 1010  Med List Status: <None>  Medication Order Taking? Sig Documenting Provider Last Dose Status Informant  albuterol (PROVENTIL) (2.5 MG/3ML) 0.083% nebulizer solution [id number] Yes Take 3 mLs (2.5 mg total) by nebulization every 4 (four) hours. And PRN  Patient taking differently: Take 2.5 mg by nebulization as needed.   Flora Lipps, MD Past Month Unknown time Active            Med Note [facility], Willey Blade   Tue May 11, 2020  8:29 AM) PRN, hasnt had to use in 2-3 months  amLODipine (NORVASC) 10 MG tablet [id number] Yes Take 1 tablet (10 mg total) by mouth daily. Teodoro Spray, MD 06/02/2020 0800 Active   aspirin 81 MG tablet [id number] Yes Take 81 mg by mouth daily.  [provider] 06/01/2020 Unknown time Active   atorvastatin (LIPITOR) 80 MG tablet [id number] Yes TAKE ONE TABLET AT BEDTIME Jacob Sons, MD 06/01/2020 Unknown time Active   carvedilol (COREG) 25 MG tablet [id number] Yes TAKE 1 TABLET BY MOUTH TWICE DAILY Jacob Sons, MD 06/02/2020 0800 Active   Cholecalciferol (VITAMIN D3 PO) [id number] Yes Take 1,000 Units by mouth daily.  [provider] 06/01/2020 Unknown time Active Self  cloNIDine (CATAPRES) 0.2 MG tablet [id number] Yes TAKE ONE TABLET BY MOUTH TWICE DAILY Jacob Sons, MD 06/02/2020 0800 Active   clopidogrel (PLAVIX) 75 MG tablet [id number] Yes Take 1 tablet (75 mg total) by  mouth daily. Teodoro Spray, MD 06/01/2020 Unknown time Active   Cyanocobalamin (VITAMIN B 12 PO) [id number] Yes Take 1,000 mcg by mouth daily.  [provider] 06/01/2020 Unknown time Active Self  doxazosin (CARDURA) 8 MG tablet [id number] Yes TAKE ONE TABLET BY MOUTH EVERY DAY -STOP FLOMAXBirdie Sons, MD 06/02/2020 Unknown time Active   Dulaglutide (TRULICITY) 1.5 VO/1.6WV SOPN [id number] Yes Inject 1.5 mg into the skin once a week. Jacob Sons, MD Past Week Unknown time Active   finasteride (PROSCAR) 5 MG tablet [id number] Yes TAKE ONE TABLET EVERY DAY Stoioff, Ronda Fairly, MD 06/02/2020 0800 Active   fluticasone (FLONASE) 50 MCG/ACT nasal spray [id number] Yes Place 2 sprays into both nostrils daily. Jacob Sons, MD Past Week Unknown time Active   furosemide (LASIX) 40 MG tablet [id number] Yes Take 40 mg by mouth 2 (two) times daily.  [provider] 06/02/2020 0800 Active   gabapentin (NEURONTIN) 600 MG tablet [id number] Yes Take 1 tablet (600 mg total) by mouth 2 (two) times daily. Jacob Sons, MD 06/02/2020 800 Active   glucose blood (ONETOUCH ULTRA) test strip [id number]  USE AS DIRECTED THREE TIMES A DAY Jacob Sons, MD  Active   hydrALAZINE (APRESOLINE) 100 MG tablet [id number] Yes TAKE ONE TABLET BY MOUTH 3 TIMES DAILY  Jacob Sons, MD 06/02/2020 0800 Active   insulin aspart (NOVOLOG FLEXPEN) 100 UNIT/ML FlexPen [id number]  Take 16 units before lunch. Jacob Sons, MD  Active   Insulin Glargine [facility]) 100 UNIT/ML [id number]  Inject into the skin daily. [provider]  Active Self           Med Note [facility], Willey Blade   Tue May 11, 2020  8:35 AM) 18 units this AM    insulin glargine (LANTUS SOLOSTAR) 100 UNIT/ML Solostar Pen [id number]  TITRATE UP TO 80 UNITS A DAY AS DIRECTED  Patient not taking: Reported on 05/11/2020   Jacob Sons, MD  Active   Insulin Pen Needle (BD PEN NEEDLE NANO U/F) 32G X 4 MM MISC [id number]  USE DAILY AS DIRECTED Jacob Sons, MD  Active   isosorbide mononitrate (IMDUR) 60 MG 24 hr tablet [id number] Yes Take 1 tablet (60 mg total) by mouth daily. Jacob Sons, MD 06/01/2020 Active   levothyroxine (SYNTHROID) 125 MCG tablet [id number] Yes TAKE 1 TABLET EVERY DAY ON EMPTY STOMACHWITH A GLASS OF WATER AT LEAST 30-60 MINBEFORE BREAKFAST Jacob Sons, MD 06/02/2020 0800 Active   losartan (COZAAR) 100 MG tablet [id number] Yes Take 1 tablet (100 mg total) by mouth daily. Teodoro Spray, MD 06/02/2020 0800 Active   Lysine 500 MG CAPS [id number] Yes Take 1 capsule by mouth daily. [provider] 06/02/2020 0800 Active   oxyCODONE (OXY IR/ROXICODONE) 5 MG immediate release tablet [id number]  Take 1 tablet (5 mg total) by mouth daily as needed for severe pain.  Patient not taking: Reported on 05/05/2020   Jacob Sons, MD  Active Self  SENNA PO [id number] Yes Take 1 capsule by mouth in the morning, at noon, in the evening, and at bedtime. [provider] 06/02/2020 0800 Active   SYMBICORT 80-4.5 MCG/ACT inhaler [id number] Yes INHALE 2 PUFFS INTO LUNGS TWICE DAILY Flora Lipps, MD Past Week Unknown time Active   Med List Note Margarita Mail, RN 03/31/15 1507): UDS done 08-11-14 Meds to last until 10-09-14          Patient Active Problem List  Diagnosis Date Noted  . Vitamin D deficiency 05/27/2019  . Vitamin B12 deficiency 05/27/2019  . Trigger middle finger of right hand 01/20/2019  . Carpal tunnel syndrome, right 01/20/2019  . 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 Primary osteoarthritis of first carpometacarpal joint of right hand 07/30/2016  . 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 Diastolic heart failure ([place]) 02/15/2015  . Regional wall motion abnormality of heart 02/15/2015  . Abnormal findings on diagnostic imaging of heart and coronary circulation 02/15/2015  . Restrictive lung disease 11/10/2014  . History of adenomatous polyp of colon 11/09/2014  . Hypothyroidism 11/09/2014  . Diabetes mellitus with nephropathy ([place]) 11/09/2014  . Hyperlipidemia 11/09/2014  . Depression 11/09/2014  . Insomnia 11/09/2014  . Resistant hypertension 11/09/2014  . GERD (gastroesophageal reflux disease) 11/09/2014  . Chronic kidney disease (CKD), stage III (moderate) ([place]) 11/09/2014  . Dyspnea on exertion 11/09/2014  . History of basal cell cancer 09/15/2014  . DDD (degenerative disc disease), lumbar 09/09/2014  . Facet syndrome, lumbar 09/09/2014  . 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 Greater trochanteric bursitis of both hips 09/09/2014  . Allergic rhinitis 08/12/2014  . Benign prostatic hyperplasia with urinary obstruction 06/24/2013  . Nocturia 05/30/2013  . Frequency of micturition 05/30/2013  . Urge incontinence 05/30/2013  . Asthma, chronic 05/29/2013  . Morbid obesity ([place]) 05/29/2013  . OSA treated with BiPAP 05/29/2013  . Scoliosis 11/28/2011  . SCC (squamous cell carcinoma), face 08/01/2011    Immunization History  Administered Date(s) Administered  . Fluad Quad(high Dose 65+) 01/21/2019  . Influenza Split 02/26/2013, 01/12/2014  . Influenza, High Dose Seasonal PF 02/09/2015, 03/07/2016, 03/20/2017, 02/13/2018, 01/30/2020  . PFIZER(Purple Top)SARS-COV-2  Vaccination 06/17/2019, 07/08/2019, 01/30/2020  . Pneumococcal Conjugate-13 01/16/2014  . Pneumococcal Polysaccharide-23 07/07/2008, 03/07/2016  . Td 01/05/2003  . Tdap 07/07/2008  . Zoster Recombinat (Shingrix) 09/28/2017, 12/21/2017    Conditions to be addressed/monitored:  Hypertension, Hyperlipidemia, Diabetes, Heart Failure, Asthma, Chronic Kidney Disease, Hypothyroidism, Depression, BPH and Insomnia  Care Plan : General Pharmacy (Adult)  Updates made by [name], RPH since 07/16/2020 12:00 AM    Problem: Hypertension, Hyperlipidemia, Diabetes, Heart Failure, Asthma, Chronic Kidney Disease, Hypothyroidism, Depression, BPH and Insomnia   Priority: High    Long-Range Goal: Patient-Specific Goal   Start Date: 07/13/2020  Expected End Date: 01/13/2021  This Visit's Progress: On track  Priority: High  Note:   Current Barriers:  . Unable to independently afford treatment regimen  Pharmacist Clinical Goal(s):  [name] Kitchen Patient will verbalize ability to afford treatment regimen through collaboration with PharmD and provider.   Interventions: . 1:1 collaboration with Jacob Sons, MD regarding development and update of comprehensive plan of care as evidenced by provider attestation and co-signature . Inter-disciplinary care team collaboration (see longitudinal plan of care) . Comprehensive medication review performed; medication list updated in electronic medical record  Hyperlipidemia: (LDL goal < 70) -Controlled -Current treatment: . Atorvastatin 80 mg daily  -Medications previously tried: NA  -Educated on Importance of limiting foods high in cholesterol; -Recommended to continue current medication  Diabetes (A1c goal <8%) -Controlled -Current medications: . Trulicity 1.5 mg weekly  . Humalog sliding scale  . Basaglar up to 80 units daily (typically 30 units daily)  -Medications previously tried: Bydureon, Glipizide, Victoza, metformin, Ozempic, Actos, Januvia   -Patient makes frequent adjustments to Basaglar based on breakfast blood sugars. Rarely needs to administer Humalog.  -Current home glucose readings . fasting glucose: Patient did not bring to  clinic, checks once daily. . post prandial glucose: NA -Denies hypoglycemic/hyperglycemic symptoms -Current exercise: Sedentary. Patient is limited by sciatica, neuropathy pain.  -Educated on Prevention and management of hypoglycemic episodes; Benefits of routine self-monitoring of blood sugar;  - Counseled patient to avoid frequent titrations in basaglar based on fluctuations in readings. Concerned for risk of hypoglycemia given low A1c today, but patient denies frequent symptoms. Given age and neuropathy, patient may be experiencing asymptomatic hypoglycemia.  -Counseled to check feet daily and get yearly eye exams - Could consider SGLT2 for CKD / HF protection  -Could consider FreeStyle Libre 2 given insulin use and risk for asymptomatic hypogylcemia. Patient preferred to defer changes until next PCP follow-up.  -Recommended to continue current medication  Heart Failure (Goal: manage symptoms and prevent exacerbations) -Controlled -Last ejection fraction: 50% -HF type: Diastolic -NYHA Class: II (slight limitation of activity) -AHA HF Stage: C (Heart disease and symptoms present) -Current treatment: . Amlodipine 10 mg daily  . Carvedilol 25 mg twice daily  . Clonidine 0.2 mg twice daily  . Doxazosin 8 mg daily  . Furosemide 40 mg twice daily  . Hydralazine 100 mg three times daily  . Imdur 60 mg daily  . Losartan 100 mg daily  -Medications previously tried: Chlorthalidone, spironolactone, nebivolol   -Current home BP/HR readings: NA -Educated on Importance of weighing daily; if you gain more than 3 pounds in one day or 5 pounds in one week, contact provider's office Importance of blood pressure control -Recommended to continue current medication  Hypothyroidism (Goal: Maintain stable  thyroid function) -Controlled -Current treatment  . Levothyroxine 125 mcg daily before breakfast  -Medications previously tried: NA  -Recommended to continue current medication  Patient Goals/Self-Care Activities . Patient will:  - check glucose 3-4 times daily: before meals and at bedtime, document, and provide at future appointments check blood pressure weekly, document, and provide at future appointments  Follow Up Plan: Telephone follow up appointment with care management team member scheduled for:  10/15/2020 at 1:00 PM      Medication Assistance: Humalog, [place], Trulicityobtained through [employer] medication assistance program.  Enrollment ends dec 2022   Patient's preferred pharmacy is:  [place] [place] [address] Phone: [phone] Fax: [phone]  [id number] [address] [place]: [phone]: [phone] Uses pill box? Yes Pt endorses 100% compliance  We discussed: Current pharmacy is preferred with insurance plan and patient is satisfied with pharmacy services Patient decided to: Continue current medication management strategy  Care Plan and Follow Up Patient Decision:  Patient agrees to Care Plan and Follow-up.  Plan: Telephone follow up appointment with care management team member scheduled for:  10/15/2020 at 1:00 PM  [phone]

## 2020-07-13 NOTE — Progress Notes (Signed)
Established patient visit   Patient: Jacob Herrera   DOB: Dec 07, 1950   70 y.o. Male  MRN: [id number] Visit Date: 07/13/2020  Today's healthcare provider: Lelon Huh, MD   Chief Complaint  Patient presents with  . Diabetes  . Hypertension   Subjective    HPI  Diabetes Mellitus Type II, Follow-up  Lab Results  Component Value Date   HGBA1C 6.1 (A) 07/13/2020   HGBA1C 6.7 (A) 03/15/2020   HGBA1C 6.4 (A) 10/20/2019   Wt Readings from Last 3 Encounters:  07/13/20 273 lb (123.8 kg)  06/02/20 268 lb (121.6 kg)  05/11/20 268 lb (121.6 kg)   Last seen for diabetes 4 months ago.  Management since then includes continue same medications. Since last visit patient has started using a medication assistance program, which he reports changed his Diabetes medications from Novolog to Humalog, and from Lantus to [employer].  He reports good compliance with treatment. He is not having side effects.  Symptoms: No fatigue No foot ulcerations  No appetite changes No nausea  No paresthesia of the feet  No polydipsia  No polyuria No visual disturbances   No vomiting     Home blood sugar records: fasting range: 140's  Episodes of hypoglycemia? No    Current insulin regiment: Humalog sliding scale and Basaglar about 30 units every morning Most Recent Eye Exam: 03/29/2020 Current exercise: none Current diet habits: in general, an "unhealthy" diet  Pertinent Labs: Lab Results  Component Value Date   CHOL 119 05/26/2019   HDL 33 (L) 05/26/2019   LDLCALC 46 05/26/2019   TRIG 252 (H) 05/26/2019   CHOLHDL 3.6 05/26/2019   Lab Results  Component Value Date   NA 137 10/07/2018   K 3.8 10/07/2018   CREATININE 1.85 (H) 10/07/2018   GFRNONAA 36 (L) 10/07/2018   GFRAA 42 (L) 10/07/2018   GLUCOSE 134 (H) 10/07/2018     ---------------------------------------------------------------------------------------------------  Hypertension, follow-up  BP Readings from Last 3  Encounters:  07/13/20 (!) 150/73  06/02/20 (!) 149/64  05/11/20 133/62   Wt Readings from Last 3 Encounters:  07/13/20 273 lb (123.8 kg)  06/02/20 268 lb (121.6 kg)  05/11/20 268 lb (121.6 kg)     He was last seen for hypertension 4 months ago.  BP at that visit was 151/74. Management since that visit includes continue same medications.  He reports good compliance with treatment. He is not having side effects.  He is following a Regular diet. He is not exercising. He does not smoke.  Use of agents associated with hypertension: NSAIDS.   Outside blood pressures are not checked. Symptoms: No chest pain No chest pressure  No palpitations No syncope  Yes dyspnea No orthopnea  No paroxysmal nocturnal dyspnea No lower extremity edema   Pertinent labs: Lab Results  Component Value Date   CHOL 119 05/26/2019   HDL 33 (L) 05/26/2019   LDLCALC 46 05/26/2019   TRIG 252 (H) 05/26/2019   CHOLHDL 3.6 05/26/2019   Lab Results  Component Value Date   NA 137 10/07/2018   K 3.8 10/07/2018   CREATININE 1.85 (H) 10/07/2018   GFRNONAA 36 (L) 10/07/2018   GFRAA 42 (L) 10/07/2018   GLUCOSE 134 (H) 10/07/2018     The ASCVD Risk score [name], et al., 2013) failed to calculate for the following reasons:   The valid total cholesterol range is 130 to 320 mg/dL   ---------------------------------------------------------------------------------------------------  He also complains of long  history of low back pain radiating to legs, especially left leg with numbness and tingling. Had MRI in 2014 showing severe scoliosis and lumbar annual disk bulges. He is now concerned because he is having more weakness in his legs difficulty walking and concerned it may be related to back. He is requested a referral to have those evaluated further.   He has also been having pain in his left foot for a few months and requests referral to podiatrist.     Medications: Outpatient Medications Prior to  Visit  Medication Sig Note  . albuterol (PROVENTIL) (2.5 MG/3ML) 0.083% nebulizer solution Take 3 mLs (2.5 mg total) by nebulization every 4 (four) hours. And PRN (Patient taking differently: Take 2.5 mg by nebulization as needed.)   . amLODipine (NORVASC) 10 MG tablet Take 1 tablet (10 mg total) by mouth daily.   [name] Kitchen aspirin 81 MG tablet Take 81 mg by mouth daily.   [name] Kitchen atorvastatin (LIPITOR) 80 MG tablet TAKE ONE TABLET AT BEDTIME   . carvedilol (COREG) 25 MG tablet TAKE 1 TABLET BY MOUTH TWICE DAILY   . Cholecalciferol (VITAMIN D3 PO) Take 1,000 Units by mouth daily.    . cloNIDine (CATAPRES) 0.2 MG tablet TAKE 1 TABLET BY MOUTH TWICE DAILY   . clopidogrel (PLAVIX) 75 MG tablet Take 1 tablet (75 mg total) by mouth daily.   . Cyanocobalamin (VITAMIN B 12 PO) Take 1,000 mcg by mouth daily.    [name] Kitchen doxazosin (CARDURA) 8 MG tablet TAKE ONE TABLET BY MOUTH EVERY DAY -STOP FLOMAX-   . Dulaglutide (TRULICITY) 1.5 WC/3.7SE SOPN Inject 1.5 mg into the skin once a week.   . finasteride (PROSCAR) 5 MG tablet TAKE ONE TABLET EVERY DAY   . fluticasone (FLONASE) 50 MCG/ACT nasal spray Place 2 sprays into both nostrils daily.   . furosemide (LASIX) 40 MG tablet Take 40 mg by mouth 2 (two) times daily.    [name] Kitchen gabapentin (NEURONTIN) 600 MG tablet Take 1 tablet (600 mg total) by mouth 2 (two) times daily.   [name] Kitchen glucose blood (ONETOUCH ULTRA) test strip USE AS DIRECTED THREE TIMES A DAY   . HUMALOG KWIKPEN 100 UNIT/ML KwikPen Inject into the skin. Sliding scale   . hydrALAZINE (APRESOLINE) 100 MG tablet TAKE ONE TABLET BY MOUTH 3 TIMES DAILY   . Insulin Glargine (BASAGLAR KWIKPEN) 100 UNIT/ML Inject into the skin daily.   . Insulin Pen Needle (BD PEN NEEDLE NANO U/F) 32G X 4 MM MISC USE DAILY AS DIRECTED   . isosorbide mononitrate (IMDUR) 60 MG 24 hr tablet TAKE 1 TABLET BY MOUTH DAILY   . levothyroxine (SYNTHROID) 125 MCG tablet TAKE 1 TABLET EVERY DAY ON EMPTY [place] A GLASS OF WATER AT LEAST 30-60 MINBEFORE  BREAKFAST   . losartan (COZAAR) 100 MG tablet Take 1 tablet (100 mg total) by mouth daily.   [name] Kitchen Lysine 500 MG CAPS Take 1 capsule by mouth daily.   [name] Kitchen oxyCODONE (OXY IR/ROXICODONE) 5 MG immediate release tablet Take 1 tablet (5 mg total) by mouth daily as needed for severe pain. (Patient not taking: Reported on 05/05/2020)   . SENNA PO Take 1 capsule by mouth in the morning, at noon, in the evening, and at bedtime.   . SYMBICORT 80-4.5 MCG/ACT inhaler INHALE 2 PUFFS INTO LUNGS TWICE DAILY   . [DISCONTINUED] insulin aspart (NOVOLOG FLEXPEN) 100 UNIT/ML FlexPen Take 16 units before lunch.   . [DISCONTINUED] insulin glargine (LANTUS SOLOSTAR) 100 UNIT/ML Solostar Pen TITRATE UP TO 80  UNITS A DAY AS DIRECTED (Patient not taking: No sig reported) 07/13/2020: is on Basaglar through patient assistance   No facility-administered medications prior to visit.    Review of Systems  Constitutional: Negative for appetite change, chills and fever.  Respiratory: Negative for chest tightness, shortness of breath and wheezing.   Cardiovascular: Negative for chest pain and palpitations.  Gastrointestinal: Negative for abdominal pain, nausea and vomiting.      Objective    BP (!) 150/73 (BP Location: Right Arm, Patient Position: Sitting, Cuff Size: Large)   Pulse (!) 58   Resp 18   Wt 273 lb (123.8 kg)   BMI 41.51 kg/m      Physical Exam    General: Appearance:    Obese male in no acute distress  Eyes:    PERRL, conjunctiva/corneas clear, EOM's intact       Lungs:     Clear to auscultation bilaterally, respirations unlabored  Heart:    Bradycardic. Normal rhythm. No murmurs, rubs, or gallops.   MS:   All extremities are intact.   Neurologic:   Awake, alert, oriented x 3. No apparent focal neurological           defect.         Results for orders placed or performed in visit on 07/13/20  POCT HgB A1C  Result Value Ref Range   Hemoglobin A1C 6.1 (A) 4.0 - 5.6 %   Est. average glucose Bld gHb  Est-mCnc 128     Assessment & Plan     1. Diabetes mellitus with nephropathy ([place])  Well controlled.  Continue current medications.    2. Left foot pain  - Ambulatory referral to Podiatry  3. Weakness of lower extremity, unspecified laterality  - Ambulatory referral to Neurosurgery  4. Back pain with radiation  - Ambulatory referral to Neurosurgery  5. Numbness in left leg  - Ambulatory referral to Neurosurgery  6. Bulging lumbar disc  - Ambulatory referral to Neurosurgery  Follow up diabetes 3-4 months.         The entirety of the information documented in the History of Present Illness, Review of Systems and Physical Exam were personally obtained by me. Portions of this information were initially documented by the CMA and reviewed by me for thoroughness and accuracy.      Lelon Huh, MD  [facility] [phone] (phone) [phone] (fax)  [place]

## 2020-07-16 NOTE — Patient Instructions (Signed)
Visit Information It was great speaking with you today!  Please let me know if you have any questions about our visit.  Goals Addressed              This Visit's Progress   .  COMPLETED: Medication assistance plan 2021 (pt-stated)        Current Barriers:  . financial  Pharmacist Clinical Goal(s): Over the next 14 days, Jacob Herrera will provide the necessary supplementary documents (proof of out of pocket prescription expenditure, proof of household income) needed for medication assistance applications to CCM pharmacist.   Interventions: . CCM pharmacist will apply for medication assistance program for Ozempic, Novolog made by [employer] and prescribed by Dr. Caryn Section o Updated 1/4: reviewed accepted financial documents, patient has not received 2020 annual 1099 form o Updated 1/22: per representative, financial records were difficult to read and need to be re-faxed . In February 2021, CCM pharmacist and patient will apply for medication assistance program for Lantus made by [employer] and prescribed by Dr. Caryn Section  . Updated 1/15: received completed application for Ozempic, Novolog and submitted to [employer]; uploaded copy under media tab . Updated 2/1: patient provided application materials for [employer]/Lantus application following appointment with Dr. Caryn Section.  Patient Self Care Activities:  [name] Kitchen Gather necessary documents needed to apply for medication assistance  Please see past updates related to this goal by clicking on the "Past Updates" button in the selected goal      .  Monitor and Manage My Blood Sugar-Diabetes Type 2        Timeframe:  Long-Range Goal Priority:  High Start Date:  07/13/20                           Expected End Date: 01/13/21                      Follow Up Date 08/21/2020    -check blood sugar 3-4 times daily: Before meals and at bedtime - check blood sugar if I feel it is too high or too low - enter blood sugar readings and medication or insulin into daily log -  take the blood sugar log to all doctor visits    Why is this important?    Checking your blood sugar at home helps to keep it from getting very high or very low.   Writing the results in a diary or log helps the doctor know how to care for you.   Your blood sugar log should have the time, date and the results.   Also, write down the amount of insulin or other medicine that you take.   Other information, like what you ate, exercise done and how you were feeling, will also be helpful.     Notes:        Patient Care Plan: General Pharmacy (Adult)    Problem Identified: Hypertension, Hyperlipidemia, Diabetes, Heart Failure, Asthma, Chronic Kidney Disease, Hypothyroidism, Depression, BPH and Insomnia   Priority: High    Long-Range Goal: Patient-Specific Goal   Start Date: 07/13/2020  Expected End Date: 01/13/2021  This Visit's Progress: On track  Priority: High  Note:   Current Barriers:  . Unable to independently afford treatment regimen  Pharmacist Clinical Goal(s):  [name] Kitchen Patient will verbalize ability to afford treatment regimen through collaboration with PharmD and provider.   Interventions: . 1:1 collaboration with [name], MD regarding development and update of comprehensive plan of  care as evidenced by provider attestation and co-signature . Inter-disciplinary care team collaboration (see longitudinal plan of care) . Comprehensive medication review performed; medication list updated in electronic medical record  Hyperlipidemia: (LDL goal < 70) -Controlled -Current treatment: . Atorvastatin 80 mg daily  -Medications previously tried: NA  -Educated on Importance of limiting foods high in cholesterol; -Recommended to continue current medication  Diabetes (A1c goal <8%) -Controlled -Current medications: . Trulicity 1.5 mg weekly  . Humalog sliding scale  . Basaglar up to 80 units daily (typically 30 units daily)  -Medications previously tried: Bydureon,  Glipizide, Victoza, metformin, Ozempic, Actos, Januvia  -Patient makes frequent adjustments to Basaglar based on breakfast blood sugars. Rarely needs to administer Humalog.  -Current home glucose readings . fasting glucose: Patient did not bring to clinic, checks once daily. . post prandial glucose: NA -Denies hypoglycemic/hyperglycemic symptoms -Current exercise: Sedentary. Patient is limited by sciatica, neuropathy pain.  -Educated on Prevention and management of hypoglycemic episodes; Benefits of routine self-monitoring of blood sugar;  - Counseled patient to avoid frequent titrations in basaglar based on fluctuations in readings. Concerned for risk of hypoglycemia given low A1c today, but patient denies frequent symptoms. Given age and neuropathy, patient may be experiencing asymptomatic hypoglycemia.  -Counseled to check feet daily and get yearly eye exams - Could consider SGLT2 for CKD / HF protection  -Could consider FreeStyle Libre 2 given insulin use and risk for asymptomatic hypogylcemia. Patient preferred to defer changes until next PCP follow-up.  -Recommended to continue current medication  Heart Failure (Goal: manage symptoms and prevent exacerbations) -Controlled -Last ejection fraction: 50% -HF type: Diastolic -NYHA Class: II (slight limitation of activity) -AHA HF Stage: C (Heart disease and symptoms present) -Current treatment: . Amlodipine 10 mg daily  . Carvedilol 25 mg twice daily  . Clonidine 0.2 mg twice daily  . Doxazosin 8 mg daily  . Furosemide 40 mg twice daily  . Hydralazine 100 mg three times daily  . Imdur 60 mg daily  . Losartan 100 mg daily  -Medications previously tried: Chlorthalidone, spironolactone, nebivolol   -Current home BP/HR readings: NA -Educated on Importance of weighing daily; if you gain more than 3 pounds in one day or 5 pounds in one week, contact provider's office Importance of blood pressure control -Recommended to continue current  medication  Hypothyroidism (Goal: Maintain stable thyroid function) -Controlled -Current treatment  . Levothyroxine 125 mcg daily before breakfast  -Medications previously tried: NA  -Recommended to continue current medication  Patient Goals/Self-Care Activities . Patient will:  - check glucose 3-4 times daily: before meals and at bedtime, document, and provide at future appointments check blood pressure weekly, document, and provide at future appointments  Follow Up Plan: Telephone follow up appointment with care management team member scheduled for:  10/15/2020 at 1:00 PM      Patient agreed to services and verbal consent obtained.   The patient verbalized understanding of instructions, educational materials, and care plan provided today and declined offer to receive copy of patient instructions, educational materials, and care plan.   [name], PharmD, [place] [phone]

## 2020-07-17 ENCOUNTER — Other Ambulatory Visit: Payer: Self-pay | Admitting: Family Medicine

## 2020-07-17 NOTE — Telephone Encounter (Signed)
Requested medication (s) are due for refill today Yes   Requested medication (s) are on the active medication list Yes     Future visit scheduled Yes for 11/12/20.   Note to clinic-Did not meet protocol for required labs, last TSH 05/26/19   Requested Prescriptions  Pending Prescriptions Disp Refills   levothyroxine (SYNTHROID) 125 MCG tablet [Pharmacy Med Name: LEVOTHYROXINE SODIUM 125 MCG TAB] 90 tablet 1    Sig: TAKE 1 TABLET EVERY DAY ON EMPTY [place] A GLASS OF WATER AT LEAST 30-60 [place]      Endocrinology:  Hypothyroid Agents Failed - 07/17/2020 12:44 PM      Failed - TSH needs to be rechecked within 3 months after an abnormal result. Refill until TSH is due.      Failed - TSH in normal range and within 360 days    TSH  Date Value Ref Range Status  05/26/2019 2.720 0.450 - 4.500 uIU/mL Final          Passed - Valid encounter within last 12 months    Recent Outpatient Visits           4 days ago Diabetes mellitus with nephropathy [facility])   [facility] [name], MD   4 months ago Diabetes mellitus with nephropathy [facility])   [facility] [name], MD   9 months ago Diabetes mellitus with nephropathy [facility])   [facility] [name], MD   1 year ago Diabetes mellitus with nephropathy [facility])   [facility] [name], MD   1 year ago Sore throat   [facility] [place], [place]       Future Appointments             In 3 months [name], [name], MD [facility], [place]   In 4 months [place], Ronda Fairly, [place]

## 2020-07-27 ENCOUNTER — Ambulatory Visit: Payer: PPO | Admitting: Podiatry

## 2020-08-03 ENCOUNTER — Other Ambulatory Visit (HOSPITAL_COMMUNITY): Payer: Self-pay | Admitting: Family Medicine

## 2020-08-03 ENCOUNTER — Other Ambulatory Visit: Payer: Self-pay | Admitting: Family Medicine

## 2020-08-03 DIAGNOSIS — M4807 Spinal stenosis, lumbosacral region: Secondary | ICD-10-CM | POA: Diagnosis not present

## 2020-08-03 DIAGNOSIS — M5136 Other intervertebral disc degeneration, lumbar region: Secondary | ICD-10-CM | POA: Diagnosis not present

## 2020-08-03 DIAGNOSIS — M545 Low back pain, unspecified: Secondary | ICD-10-CM | POA: Diagnosis not present

## 2020-08-03 DIAGNOSIS — M5416 Radiculopathy, lumbar region: Secondary | ICD-10-CM | POA: Diagnosis not present

## 2020-08-05 ENCOUNTER — Other Ambulatory Visit: Payer: Self-pay | Admitting: Urology

## 2020-08-05 DIAGNOSIS — N401 Enlarged prostate with lower urinary tract symptoms: Secondary | ICD-10-CM

## 2020-08-10 ENCOUNTER — Encounter: Payer: Self-pay | Admitting: Podiatry

## 2020-08-10 ENCOUNTER — Ambulatory Visit: Payer: PPO | Admitting: Podiatry

## 2020-08-10 ENCOUNTER — Other Ambulatory Visit: Payer: Self-pay

## 2020-08-10 DIAGNOSIS — M79675 Pain in left toe(s): Secondary | ICD-10-CM | POA: Diagnosis not present

## 2020-08-10 DIAGNOSIS — N2581 Secondary hyperparathyroidism of renal origin: Secondary | ICD-10-CM | POA: Diagnosis not present

## 2020-08-10 DIAGNOSIS — N041 Nephrotic syndrome with focal and segmental glomerular lesions: Secondary | ICD-10-CM | POA: Diagnosis not present

## 2020-08-10 DIAGNOSIS — E1121 Type 2 diabetes mellitus with diabetic nephropathy: Secondary | ICD-10-CM | POA: Diagnosis not present

## 2020-08-10 DIAGNOSIS — M79674 Pain in right toe(s): Secondary | ICD-10-CM

## 2020-08-10 DIAGNOSIS — B351 Tinea unguium: Secondary | ICD-10-CM | POA: Diagnosis not present

## 2020-08-10 DIAGNOSIS — R809 Proteinuria, unspecified: Secondary | ICD-10-CM | POA: Diagnosis not present

## 2020-08-10 DIAGNOSIS — N1832 Chronic kidney disease, stage 3b: Secondary | ICD-10-CM | POA: Diagnosis not present

## 2020-08-10 DIAGNOSIS — I129 Hypertensive chronic kidney disease with stage 1 through stage 4 chronic kidney disease, or unspecified chronic kidney disease: Secondary | ICD-10-CM | POA: Diagnosis not present

## 2020-08-11 ENCOUNTER — Encounter: Payer: Self-pay | Admitting: Podiatry

## 2020-08-11 NOTE — Progress Notes (Signed)
  Subjective:  Patient ID: Jacob Herrera, male    DOB: [birth year],  MRN: [id number]  Chief Complaint  Patient presents with  . Diabetes    DFC, neuropath, burning, tingling numbness in bilat toes worse in the left   70 y.o. male returns for the above complaint.  Patient presents with thickened elongated dystrophic toenails x10.  Painful to touch.  Patient not able to debride now as much as he used to.  He would like for me to do it.  He is a diabetic and at risk foot care.  His A1c is 6.1.  He denies any other acute complaints he denies seeing anyone else prior to seeing me.  Objective:  There were no vitals filed for this visit. Podiatric Exam: Vascular: dorsalis pedis and posterior tibial pulses are palpable bilateral. Capillary return is immediate. Temperature gradient is WNL. Skin turgor WNL  Sensorium: Normal Semmes Weinstein monofilament test. Normal tactile sensation bilaterally. Nail Exam: Pt has thick disfigured discolored nails with subungual debris noted bilateral entire nail hallux through fifth toenails.  Pain on palpation to the nails. Ulcer Exam: There is no evidence of ulcer or pre-ulcerative changes or infection. Orthopedic Exam: Muscle tone and strength are WNL. No limitations in general ROM. No crepitus or effusions noted. HAV  B/L.  Hammer toes 2-5  B/L. Skin: No Porokeratosis. No infection or ulcers    Assessment & Plan:   1. Pain due to onychomycosis of toenails of both feet   2. Diabetes mellitus with nephropathy [facility])     Patient was evaluated and treated and all questions answered.  Onychomycosis with pain  -Nails palliatively debrided as below. -Educated on self-care  Procedure: Nail Debridement Rationale: pain  Type of Debridement: manual, sharp debridement. Instrumentation: Nail nipper, rotary burr. Number of Nails: 10  Procedures and Treatment: Consent by patient was obtained for treatment procedures. The patient understood the discussion of  treatment and procedures well. All questions were answered thoroughly reviewed. Debridement of mycotic and hypertrophic toenails, 1 through 5 bilateral and clearing of subungual debris. No ulceration, no infection noted.  Return Visit-Office Procedure: Patient instructed to return to the office for a follow up visit 3 months for continued evaluation and treatment.  Boneta Lucks, DPM    No follow-ups on file.

## 2020-08-12 ENCOUNTER — Ambulatory Visit
Admission: RE | Admit: 2020-08-12 | Discharge: 2020-08-12 | Disposition: A | Discharge status: Home / Self Care | Payer: PPO | Source: Ambulatory Visit | Attending: Family Medicine | Admitting: Family Medicine

## 2020-08-12 ENCOUNTER — Other Ambulatory Visit: Payer: Self-pay

## 2020-08-12 DIAGNOSIS — M545 Low back pain, unspecified: Secondary | ICD-10-CM | POA: Diagnosis not present

## 2020-08-12 DIAGNOSIS — M4807 Spinal stenosis, lumbosacral region: Secondary | ICD-10-CM | POA: Insufficient documentation

## 2020-08-13 ENCOUNTER — Other Ambulatory Visit: Payer: Self-pay | Admitting: Family Medicine

## 2020-08-13 DIAGNOSIS — G629 Polyneuropathy, unspecified: Secondary | ICD-10-CM

## 2020-08-16 DIAGNOSIS — R809 Proteinuria, unspecified: Secondary | ICD-10-CM | POA: Diagnosis not present

## 2020-08-16 DIAGNOSIS — N2581 Secondary hyperparathyroidism of renal origin: Secondary | ICD-10-CM | POA: Diagnosis not present

## 2020-08-16 DIAGNOSIS — N1832 Chronic kidney disease, stage 3b: Secondary | ICD-10-CM | POA: Diagnosis not present

## 2020-08-16 DIAGNOSIS — N041 Nephrotic syndrome with focal and segmental glomerular lesions: Secondary | ICD-10-CM | POA: Diagnosis not present

## 2020-08-17 DIAGNOSIS — M5416 Radiculopathy, lumbar region: Secondary | ICD-10-CM | POA: Diagnosis not present

## 2020-08-17 DIAGNOSIS — M5136 Other intervertebral disc degeneration, lumbar region: Secondary | ICD-10-CM | POA: Diagnosis not present

## 2020-08-17 DIAGNOSIS — M4807 Spinal stenosis, lumbosacral region: Secondary | ICD-10-CM | POA: Diagnosis not present

## 2020-08-24 DIAGNOSIS — M48062 Spinal stenosis, lumbar region with neurogenic claudication: Secondary | ICD-10-CM | POA: Diagnosis not present

## 2020-08-24 DIAGNOSIS — M5416 Radiculopathy, lumbar region: Secondary | ICD-10-CM | POA: Diagnosis not present

## 2020-08-24 DIAGNOSIS — M5136 Other intervertebral disc degeneration, lumbar region: Secondary | ICD-10-CM | POA: Diagnosis not present

## 2020-09-10 DIAGNOSIS — M1712 Unilateral primary osteoarthritis, left knee: Secondary | ICD-10-CM | POA: Diagnosis not present

## 2020-09-14 DIAGNOSIS — M5416 Radiculopathy, lumbar region: Secondary | ICD-10-CM | POA: Diagnosis not present

## 2020-09-14 DIAGNOSIS — M4807 Spinal stenosis, lumbosacral region: Secondary | ICD-10-CM | POA: Diagnosis not present

## 2020-09-14 DIAGNOSIS — M5136 Other intervertebral disc degeneration, lumbar region: Secondary | ICD-10-CM | POA: Diagnosis not present

## 2020-09-15 ENCOUNTER — Other Ambulatory Visit: Payer: Self-pay | Admitting: Family Medicine

## 2020-09-15 DIAGNOSIS — I10 Essential (primary) hypertension: Secondary | ICD-10-CM

## 2020-09-17 DIAGNOSIS — G4733 Obstructive sleep apnea (adult) (pediatric): Secondary | ICD-10-CM | POA: Diagnosis not present

## 2020-09-17 DIAGNOSIS — J45909 Unspecified asthma, uncomplicated: Secondary | ICD-10-CM | POA: Diagnosis not present

## 2020-09-24 ENCOUNTER — Other Ambulatory Visit: Payer: Self-pay | Admitting: Family Medicine

## 2020-09-24 DIAGNOSIS — E785 Hyperlipidemia, unspecified: Secondary | ICD-10-CM

## 2020-10-08 ENCOUNTER — Other Ambulatory Visit: Payer: Self-pay | Admitting: Family Medicine

## 2020-10-14 ENCOUNTER — Telehealth: Payer: Self-pay

## 2020-10-14 NOTE — Progress Notes (Signed)
Left voice message to confirmed patient Office appointment on 10/15/2020  for CCM at 1:00 pm with [name] the Clinical pharmacist. Left message to have all medications, supplements, blood pressure and blood sugar logs available during appointment and to return call if need to reschedule.  Star Rating Drug: Atorvastatin 80 mg last filled on 09/24/2020 for 90 day supply at [place]. Losartan 100 mg last filled on 08/13/2020 [facility].    Any gaps in medications fill history? None ID     Anderson [place] Clinical Pharmacist Assistant [phone]

## 2020-10-15 ENCOUNTER — Telehealth: Payer: Self-pay

## 2020-10-15 NOTE — Progress Notes (Deleted)
Chronic Care Management Pharmacy Note  10/15/2020 Name:  Jacob Herrera MRN:  [id number] DOB:  1950/07/09  Summary:   Recommendations/Changes made from today's visit:   Plan:   Subjective: Jacob Herrera is an 70 y.o. year old male who is a primary patient of [name], [name], MD.  The CCM team was consulted for assistance with disease management and care coordination needs.    Engaged with patient face to face for follow up visit in response to provider referral for pharmacy case management and/or care coordination services.   Consent to Services:  The patient was given information about Chronic Care Management services, agreed to services, and gave verbal consent prior to initiation of services.  Please see initial visit note for detailed documentation.   Patient Care Team: [name], MD as PCP - General (Family Medicine) Manya Silvas, MD (Inactive) as Consulting Physician (Gastroenterology) [name], MD as Consulting Physician (Nephrology) Ubaldo Glassing Javier Docker, MD as Consulting Physician (Cardiology) [name], MD as Referring Physician (Dermatology) [name], MD (Urology) [name], [facility] (Pharmacist)  Recent office visits: 03/15/20: Patient presented to Dr. Caryn Section for follow-up. BP 151/74. Ozempic switched to Trulicity   Recent consult visits: 06/17/20: Patient presented to Dr. Ubaldo Glassing (Cardiology) for follow-up. No medication changes made.   Hospital visits: 06/02/20: Patient hospitalized for cataract extraction.  05/11/20: Patient hospitalized for cataract extraction.   Objective:  Lab Results  Component Value Date   CREATININE 1.85 (H) 10/07/2018   BUN 33 (H) 10/07/2018   GFRNONAA 36 (L) 10/07/2018   GFRAA 42 (L) 10/07/2018   NA 137 10/07/2018   K 3.8 10/07/2018   CALCIUM 10.0 10/07/2018   CO2 22 10/07/2018   GLUCOSE 134 (H) 10/07/2018    Lab Results  Component Value Date/Time   HGBA1C 6.1 (A) 07/13/2020 01:56 PM    HGBA1C 6.7 (A) 03/15/2020 03:36 PM   HGBA1C 6.3 (H) 03/20/2017 03:03 PM   HGBA1C 8.4 (A) 09/08/2014 12:00 AM   MICROALBUR neg 03/20/2017 02:25 PM    Last diabetic Eye exam:  Lab Results  Component Value Date/Time   HMDIABEYEEXA No Retinopathy 03/29/2020 12:00 AM    Last diabetic Foot exam: No results found for: HMDIABFOOTEX   Lab Results  Component Value Date   CHOL 119 05/26/2019   HDL 33 (L) 05/26/2019   LDLCALC 46 05/26/2019   TRIG 252 (H) 05/26/2019   CHOLHDL 3.6 05/26/2019    Hepatic Function Latest Ref Rng & Units 10/07/2018 12/21/2016 07/04/2013  Total Protein 6.0 - 8.5 g/dL 6.6 7.0 7.5  Albumin 3.8 - 4.8 g/dL 4.2 4.3 3.3(L)  AST 0 - 40 IU/L 23 19 32  ALT 0 - 44 IU/L 17 21 34  Alk Phosphatase 39 - 117 IU/L 47 68 53  Total Bilirubin 0.0 - 1.2 mg/dL 0.2 0.2 0.4    Lab Results  Component Value Date/Time   TSH 2.720 05/26/2019 04:16 PM   TSH 3.480 12/21/2016 03:27 PM    CBC Latest Ref Rng & Units 11/09/2014 07/08/2013 07/07/2013  WBC 3.4 - 10.8 x10E3/uL 6.6 8.0 9.6  Hemoglobin 12.6 - 17.7 g/dL 12.4(L) 13.5 14.0  Hematocrit 37.5 - 51.0 % 38.9 41.7 43.3  Platelets 150 - 379 x10E3/uL 241 220 219    Lab Results  Component Value Date/Time   VD25OH 22.8 (L) 05/26/2019 04:16 PM    Clinical ASCVD: No  The ASCVD Risk score [name], et al., 2013) failed to calculate for the following  reasons:   The valid total cholesterol range is 130 to 320 mg/dL    Depression screen [facility] 2/9 03/15/2020 04/30/2018 03/20/2017  Decreased Interest 0 3 0  Down, Depressed, Hopeless 0 0 0  PHQ - 2 Score 0 3 0  Altered sleeping 0 2 0  Tired, decreased energy 0 3 1  Change in appetite 0 0 0  Feeling bad or failure about yourself  0 0 1  Trouble concentrating 0 1 0  Moving slowly or fidgety/restless 0 0 0  Suicidal thoughts 0 0 0  PHQ-9 Score 0 9 2  Difficult doing work/chores Not difficult at all Not difficult at all Not difficult at all    Social History   Tobacco Use  Smoking  Status Former   Packs/day: 1.00   Years: 25.00   Pack years: 25.00   Types: Cigarettes   Quit date: 05/29/1988   Years since quitting: 32.4  Smokeless Tobacco Current   Types: Chew  Tobacco Comments   chews 1 bag of loose leaf chew/week   BP Readings from Last 3 Encounters:  07/13/20 (!) 150/73  06/02/20 (!) 149/64  05/11/20 133/62   Pulse Readings from Last 3 Encounters:  07/13/20 (!) 58  06/02/20 (!) 58  05/11/20 63   Wt Readings from Last 3 Encounters:  07/13/20 273 lb (123.8 kg)  06/02/20 268 lb (121.6 kg)  05/11/20 268 lb (121.6 kg)   BMI Readings from Last 3 Encounters:  07/13/20 41.51 kg/m  06/02/20 40.75 kg/m  05/11/20 40.75 kg/m    Assessment/Interventions: Review of patient past medical history, allergies, medications, health status, including review of consultants reports, laboratory and other test data, was performed as part of comprehensive evaluation and provision of chronic care management services.   SDOH:  (Social Determinants of Health) assessments and interventions performed: Yes   CCM Care Plan  Allergies  Allergen Reactions   Chlorthalidone     D/c by dr. Juleen China for worsening renal function 03/2019   Spironolactone     gynecomastia    Medications Reviewed Today     Reviewed by Felipa Furnace, DPM (Physician) on 08/11/20 at [place] List Status: <None>   Medication Order Taking? Sig Documenting Provider Last Dose Status Informant  albuterol (PROVENTIL) (2.5 MG/3ML) 0.083% nebulizer solution [id number] No Take 3 mLs (2.5 mg total) by nebulization every 4 (four) hours. And PRN  Patient taking differently: Take 2.5 mg by nebulization as needed.   Flora Lipps, MD Past Month Unknown time Active            Med Note [facility], Willey Blade   Tue May 11, 2020  8:29 AM) PRN, hasnt had to use in 2-3 months  amLODipine (NORVASC) 10 MG tablet [id number] No Take 1 tablet (10 mg total) by mouth daily. Teodoro Spray, MD 06/02/2020 0800 Active   aspirin 81  MG tablet [id number] No Take 81 mg by mouth daily. [provider] 06/01/2020 Unknown time Active   atorvastatin (LIPITOR) 80 MG tablet [id number]  TAKE ONE TABLET AT BEDTIME [name], MD  Active   carvedilol (COREG) 25 MG tablet [id number] No TAKE 1 TABLET BY MOUTH TWICE DAILY [name], MD 06/02/2020 0800 Active   Cholecalciferol (VITAMIN D3 PO) [id number] No Take 1,000 Units by mouth daily.  [provider] 06/01/2020 Unknown time Active Self  cloNIDine (CATAPRES) 0.2 MG tablet [id number]  TAKE 1 TABLET BY MOUTH TWICE DAILY [name], [name], MD  Active   clopidogrel (  PLAVIX) 75 MG tablet [id number] No Take 1 tablet (75 mg total) by mouth daily. Teodoro Spray, MD 06/01/2020 Unknown time Active   Cyanocobalamin (VITAMIN B 12 PO) [id number] No Take 1,000 mcg by mouth daily.  [provider] 06/01/2020 Unknown time Active Self  doxazosin (CARDURA) 8 MG tablet [id number] No TAKE ONE TABLET BY MOUTH EVERY DAY -STOP FLOMAXBirdie Sons, MD 06/02/2020 Unknown time Active   Dulaglutide (TRULICITY) 1.5 HW/2.9HB SOPN [id number] No Inject 1.5 mg into the skin once a week. [name], MD Past Week Unknown time Active   erythromycin ophthalmic ointment [id number]  Apply 1 a small amount into right eye every night [provider]  Active   finasteride (PROSCAR) 5 MG tablet [id number]  TAKE ONE TABLET EVERY DAY Stoioff, Ronda Fairly, MD  Active   fluticasone (FLONASE) 50 MCG/ACT nasal spray [id number] No Place 2 sprays into both nostrils daily. [name], MD Past Week Unknown time Active   furosemide (LASIX) 40 MG tablet [id number] No Take 40 mg by mouth 2 (two) times daily.  [provider] 06/02/2020 0800 Active   gabapentin (NEURONTIN) 600 MG tablet [id number] No Take 1 tablet (600 mg total) by mouth 2 (two) times daily. [name], MD 06/02/2020 800 Active   glucose blood (ONETOUCH ULTRA) test strip [id number] No USE AS DIRECTED THREE TIMES A DAY [name], MD 05/10/2020 Unknown time Active   HUMALOG KWIKPEN 100 UNIT/ML KwikPen [id number] No Inject 6-10 Units into the skin 3 (three) times daily. Sliding scale [provider] Taking Active   hydrALAZINE (APRESOLINE) 100 MG tablet [id number] No TAKE ONE TABLET BY MOUTH 3 TIMES DAILY [name], MD 06/02/2020 0800 Active   Insulin Glargine (BASAGLAR KWIKPEN) 100 UNIT/ML [id number] No Inject 30 Units into the skin daily. [provider] Taking Active Self           Med Note [facility], Ardeen Jourdain May 11, 2020  8:35 AM) 18 units this AM    Insulin Pen Needle (BD PEN NEEDLE NANO U/F) 32G X 4 MM MISC [id number] No USE DAILY AS DIRECTED [name], MD 05/10/2020 Unknown time Active   isosorbide mononitrate (IMDUR) 60 MG 24 hr tablet [id number]  TAKE 1 TABLET BY MOUTH DAILY [name], MD  Active   levothyroxine (SYNTHROID) 125 MCG tablet [id number]  TAKE 1 TABLET EVERY DAY ON EMPTY STOMACHWITH A GLASS OF WATER AT LEAST 30-60 MINBEFORE BREAKFAST [name], MD  Active   losartan (COZAAR) 100 MG tablet [id number] No Take 1 tablet (100 mg total) by mouth daily. Teodoro Spray, MD 06/02/2020 0800 Active   Lysine 500 MG CAPS [id number] No Take 1 capsule by mouth daily. [provider] 06/02/2020 0800 Active   oxyCODONE (OXY IR/ROXICODONE) 5 MG immediate release tablet [id number] No Take 1 tablet (5 mg total) by mouth daily as needed for severe pain.  Patient not taking: Reported on 05/05/2020   [name], MD Not Taking Unknown time Active Self  senna (SENOKOT) 8.6 MG tablet [id number]  Take by mouth. [provider]  Active   SENNA PO [id number] No Take 1 capsule by mouth in the morning, at noon, in the evening, and at bedtime. [provider] 06/02/2020 0800 Active   SYMBICORT 80-4.5 MCG/ACT inhaler [id number] No INHALE 2 PUFFS INTO LUNGS TWICE DAILY Flora Lipps, MD Past Week Unknown time Active   Vitamin D, Cholecalciferol, 10 MCG (400 UNIT) TABS  [id number]  Take by mouth. [provider]  Active   Med List Note Margarita Mail, RN 03/31/15 1507): UDS done 08-11-14 Meds to last until 10-09-14            Patient Active Problem List   Diagnosis Date Noted   Vitamin D deficiency 05/27/2019   Vitamin B12 deficiency 05/27/2019   Benign hypertensive kidney disease with chronic kidney disease 04/23/2019   Nephrotic syndrome, focal and segmental glomerular lesions 04/23/2019   Proteinuria 04/23/2019   Secondary hyperparathyroidism of renal origin ([place]) 04/23/2019   Trigger middle finger of right hand 01/20/2019   Carpal tunnel syndrome, right 01/20/2019   Focal segmental glomerulosclerosis 03/26/2018   Primary osteoarthritis of first carpometacarpal joint of right hand 07/30/2016   Coronary artery disease with unstable angina pectoris ([place]) 03/31/2015   Atherosclerotic heart disease of native coronary artery with unstable angina pectoris ([place]) 59/16/3846   Diastolic heart failure ([place]) 02/15/2015   Regional wall motion abnormality of heart 02/15/2015   Abnormal findings on diagnostic imaging of heart and coronary circulation 02/15/2015   Restrictive lung disease 11/10/2014   History of adenomatous polyp of colon 11/09/2014   Hypothyroidism 11/09/2014   Diabetes mellitus with nephropathy ([place]) 11/09/2014   Hyperlipidemia 11/09/2014   Depression 11/09/2014   Insomnia 11/09/2014   Resistant hypertension 11/09/2014   GERD (gastroesophageal reflux disease) 11/09/2014   Chronic kidney disease (CKD), stage III (moderate) ([place]) 11/09/2014   Dyspnea on exertion 11/09/2014   History of basal cell cancer 09/15/2014   DDD (degenerative disc disease), lumbar 09/09/2014   Facet syndrome, lumbar 09/09/2014   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   Greater trochanteric bursitis of both hips 09/09/2014   Allergic rhinitis 08/12/2014   Benign prostatic hyperplasia with urinary obstruction 06/24/2013   Nocturia 05/30/2013   Frequency of  micturition 05/30/2013   Urge incontinence 05/30/2013   Asthma, chronic 05/29/2013   Morbid obesity ([place]) 05/29/2013   OSA treated with BiPAP 05/29/2013   Scoliosis 11/28/2011   SCC (squamous cell carcinoma), face 08/01/2011    Immunization History  Administered Date(s) Administered   Fluad Quad(high Dose 65+) 01/21/2019   Influenza Split 02/26/2013, 01/12/2014   Influenza, High Dose Seasonal PF 02/09/2015, 03/07/2016, 03/20/2017, 02/13/2018, 01/30/2020   PFIZER(Purple Top)SARS-COV-2 Vaccination 06/17/2019, 07/08/2019, 01/30/2020   Pneumococcal Conjugate-13 01/16/2014   Pneumococcal Polysaccharide-23 07/07/2008, 03/07/2016   Td 01/05/2003   Tdap 07/07/2008   Zoster Recombinat (Shingrix) 09/28/2017, 12/21/2017    Conditions to be addressed/monitored:  Hypertension, Hyperlipidemia, Diabetes, Heart Failure, Asthma, Chronic Kidney Disease, Hypothyroidism, Depression, BPH and Insomnia  There are no care plans that you recently modified to display for this patient.    Medication Assistance:  Humalog, Basaglar, Trulicityobtained through [employer] medication assistance program.  Enrollment ends dec 2022   Patient's preferred pharmacy is:  [place], [place] - [address] [place] [address] Phone: [phone] Fax: [phone]  [id number] - [place], [address] [place] [address] Phone: [phone] Fax: [phone]  Uses pill box? Yes Pt endorses 100% compliance  We discussed: Current pharmacy is preferred with insurance plan and patient is satisfied with pharmacy services Patient decided to: Continue current medication management strategy  Care Plan and Follow Up Patient Decision:  Patient agrees to Care Plan and Follow-up.  Plan: Telephone follow up appointment with care management team member scheduled for:  10/15/2020 at 1:00 PM  [facility] [phone]  Current Barriers:  Unable to independently afford treatment regimen  Pharmacist Clinical Goal(s):  Patient will verbalize ability to afford treatment regimen through collaboration with PharmD and provider.   Interventions: 1:1 collaboration with [name], MD regarding development and update of comprehensive plan of care as evidenced by provider attestation and co-signature Inter-disciplinary care team collaboration (see longitudinal plan of care) Comprehensive medication review performed; medication list updated in electronic medical record  Hyperlipidemia: (LDL goal < 70) -Controlled -Current treatment: Atorvastatin 80 mg daily  -Medications previously tried: NA  -Educated on Importance of limiting foods high in cholesterol; -Recommended to continue current medication  Diabetes (A1c goal <8%) -Controlled -Current medications: Trulicity 1.5 mg weekly  Humalog sliding scale  Basaglar up to 80 units daily (typically 30 units daily)  -Medications previously tried: Bydureon, Glipizide, Victoza, metformin, Ozempic, Actos, Januvia  -Patient makes frequent adjustments to Oppelo based on breakfast blood sugars. Rarely needs to administer Humalog.  -Current home glucose readings fasting glucose: Patient did not bring to clinic, checks once daily. post prandial glucose: NA -Denies hypoglycemic/hyperglycemic symptoms -Current exercise: Sedentary. Patient is limited by sciatica, neuropathy pain.  -Educated on Prevention and management of hypoglycemic episodes;  Heart Failure (Goal: manage symptoms and prevent exacerbations) -Controlled -Last ejection fraction: 50% -HF type: Diastolic -NYHA Class: II (slight limitation of activity) -AHA HF Stage: C (Heart disease and symptoms present) -Current treatment: Amlodipine 10 mg daily  Carvedilol 25 mg twice daily  Clonidine 0.2 mg twice daily  Doxazosin 8 mg daily  Furosemide 40 mg twice daily   Hydralazine 100 mg three times daily  Imdur 60 mg daily  Losartan 100 mg daily  -Medications previously tried: Chlorthalidone, spironolactone, nebivolol   -Current home BP/HR readings: NA -Educated on Importance of weighing daily; if you gain more than 3 pounds in one day or 5 pounds in one week, contact provider's office Importance of blood pressure control -Recommended to continue current medication  Hypothyroidism (Goal: Maintain stable thyroid function) -Controlled -Current treatment  Levothyroxine 125 mcg daily before breakfast  -Medications previously tried: NA  -Recommended to continue current medication  Patient Goals/Self-Care Activities Patient will:  - check glucose 3-4 times daily: before meals and at bedtime, document, and provide at future appointments check blood pressure weekly, document, and provide at future appointments  Follow Up Plan: ***

## 2020-10-19 ENCOUNTER — Other Ambulatory Visit: Payer: Self-pay | Admitting: Family Medicine

## 2020-11-08 DIAGNOSIS — N1832 Chronic kidney disease, stage 3b: Secondary | ICD-10-CM | POA: Diagnosis not present

## 2020-11-08 DIAGNOSIS — N041 Nephrotic syndrome with focal and segmental glomerular lesions: Secondary | ICD-10-CM | POA: Diagnosis not present

## 2020-11-08 DIAGNOSIS — R809 Proteinuria, unspecified: Secondary | ICD-10-CM | POA: Diagnosis not present

## 2020-11-08 DIAGNOSIS — N2581 Secondary hyperparathyroidism of renal origin: Secondary | ICD-10-CM | POA: Diagnosis not present

## 2020-11-08 LAB — BASIC METABOLIC PANEL
BUN: 34 — AB (ref 4–21)
CO2: 22 (ref 13–22)
Chloride: 106 (ref 99–108)
Creatinine: 2.1 — AB (ref 0.6–1.3)
Glucose: 109
Potassium: 4.5 (ref 3.4–5.3)
Sodium: 139 (ref 137–147)

## 2020-11-08 LAB — COMPREHENSIVE METABOLIC PANEL
Albumin: 3.9 (ref 3.5–5.0)
Calcium: 10.3 (ref 8.7–10.7)
GFR calc non Af Amer: 33

## 2020-11-08 LAB — CBC: RBC: 3.57 — AB (ref 3.87–5.11)

## 2020-11-08 LAB — CBC AND DIFFERENTIAL
HCT: 32 — AB (ref 41–53)
Hemoglobin: 10.7 — AB (ref 13.5–17.5)
Platelets: 249 (ref 150–399)
WBC: 6.1

## 2020-11-09 ENCOUNTER — Ambulatory Visit: Payer: PPO | Admitting: Podiatry

## 2020-11-12 ENCOUNTER — Other Ambulatory Visit: Payer: Self-pay

## 2020-11-12 ENCOUNTER — Ambulatory Visit (INDEPENDENT_AMBULATORY_CARE_PROVIDER_SITE_OTHER): Payer: PPO | Admitting: Family Medicine

## 2020-11-12 ENCOUNTER — Encounter: Payer: Self-pay | Admitting: Family Medicine

## 2020-11-12 VITALS — BP 133/60 | HR 61 | Wt 260.0 lb

## 2020-11-12 DIAGNOSIS — Z125 Encounter for screening for malignant neoplasm of prostate: Secondary | ICD-10-CM

## 2020-11-12 DIAGNOSIS — I5032 Chronic diastolic (congestive) heart failure: Secondary | ICD-10-CM

## 2020-11-12 DIAGNOSIS — E785 Hyperlipidemia, unspecified: Secondary | ICD-10-CM

## 2020-11-12 DIAGNOSIS — Z1211 Encounter for screening for malignant neoplasm of colon: Secondary | ICD-10-CM | POA: Diagnosis not present

## 2020-11-12 DIAGNOSIS — N2581 Secondary hyperparathyroidism of renal origin: Secondary | ICD-10-CM

## 2020-11-12 DIAGNOSIS — E1121 Type 2 diabetes mellitus with diabetic nephropathy: Secondary | ICD-10-CM

## 2020-11-12 DIAGNOSIS — E039 Hypothyroidism, unspecified: Secondary | ICD-10-CM | POA: Diagnosis not present

## 2020-11-12 LAB — POCT GLYCOSYLATED HEMOGLOBIN (HGB A1C): Hemoglobin A1C: 6.3 % — AB (ref 4.0–5.6)

## 2020-11-12 NOTE — Progress Notes (Deleted)
Established patient visit   Patient: Jacob Herrera   DOB: [birth year]   70 y.o. Male  MRN: [id number] Visit Date: 11/12/2020  Today's healthcare provider: Lelon Huh, MD   No chief complaint on file.  Subjective    HPI  Diabetes Mellitus Type II, Follow-up  Lab Results  Component Value Date   HGBA1C 6.1 (A) 07/13/2020   HGBA1C 6.7 (A) 03/15/2020   HGBA1C 6.4 (A) 10/20/2019   Wt Readings from Last 3 Encounters:  07/13/20 273 lb (123.8 kg)  06/02/20 268 lb (121.6 kg)  05/11/20 268 lb (121.6 kg)   Last seen for diabetes 4 months ago.  Management since then includes ***. He reports {excellent/good/fair/poor:19665} compliance with treatment. He {is/is not:21021397} having side effects. {document side effects if present:1} Symptoms: {Yes/No:20286} fatigue {Yes/No:20286} foot ulcerations  {Yes/No:20286} appetite changes {Yes/No:20286} nausea  {Yes/No:20286} paresthesia of the feet  {Yes/No:20286} polydipsia  {Yes/No:20286} polyuria {Yes/No:20286} visual disturbances   {Yes/No:20286} vomiting     Home blood sugar records: {diabetes glucometry results:16657}  Episodes of hypoglycemia? {Yes/No:20286} {enter symptoms and frequency of symptoms if yes:1}   Current insulin regiment: {enter 'none' or type of insulin and number of units taken with each dose of each insulin formulation that the patient is taking:1} Most Recent Eye Exam: *** {Current exercise:16438:::1} {Current diet habits:16563:::1}  Pertinent Labs: Lab Results  Component Value Date   CHOL 119 05/26/2019   HDL 33 (L) 05/26/2019   LDLCALC 46 05/26/2019   TRIG 252 (H) 05/26/2019   CHOLHDL 3.6 05/26/2019   Lab Results  Component Value Date   NA 137 10/07/2018   K 3.8 10/07/2018   CREATININE 1.85 (H) 10/07/2018   GFRNONAA 36 (L) 10/07/2018   GFRAA 42 (L) 10/07/2018   GLUCOSE 134 (H) 10/07/2018      ---------------------------------------------------------------------------------------------------   {Show patient history (optional):23778}   Medications: Outpatient Medications Prior to Visit  Medication Sig   albuterol (PROVENTIL) (2.5 MG/3ML) 0.083% nebulizer solution Take 3 mLs (2.5 mg total) by nebulization every 4 (four) hours. And PRN (Patient taking differently: Take 2.5 mg by nebulization as needed.)   amLODipine (NORVASC) 10 MG tablet Take 1 tablet (10 mg total) by mouth daily.   aspirin 81 MG tablet Take 81 mg by mouth daily.   atorvastatin (LIPITOR) 80 MG tablet TAKE ONE TABLET AT BEDTIME   carvedilol (COREG) 25 MG tablet TAKE 1 TABLET BY MOUTH TWICE DAILY   Cholecalciferol (VITAMIN D3 PO) Take 1,000 Units by mouth daily.    cloNIDine (CATAPRES) 0.2 MG tablet TAKE 1 TABLET BY MOUTH TWICE DAILY   clopidogrel (PLAVIX) 75 MG tablet Take 1 tablet (75 mg total) by mouth daily.   Cyanocobalamin (VITAMIN B 12 PO) Take 1,000 mcg by mouth daily.    doxazosin (CARDURA) 8 MG tablet TAKE ONE TABLET BY MOUTH EVERY DAY -STOP FLOMAX-   Dulaglutide (TRULICITY) 1.5 IO/9.7DZ SOPN Inject 1.5 mg into the skin once a week.   erythromycin ophthalmic ointment Apply 1 a small amount into right eye every night   finasteride (PROSCAR) 5 MG tablet TAKE ONE TABLET EVERY DAY   fluticasone (FLONASE) 50 MCG/ACT nasal spray USE TWO SPRAYS IN EACH NOSTRIL DAILY   furosemide (LASIX) 40 MG tablet Take 40 mg by mouth 2 (two) times daily.    gabapentin (NEURONTIN) 600 MG tablet TAKE 1 TABLET BY MOUTH 2 TIMES DAILY   glucose blood (ONETOUCH ULTRA) test strip USE AS DIRECTED THREE TIMES A DAY   HUMALOG [facility]  100 UNIT/ML KwikPen Inject 6-10 Units into the skin 3 (three) times daily. Sliding scale   hydrALAZINE (APRESOLINE) 100 MG tablet TAKE ONE TABLET BY MOUTH 3 TIMES DAILY   Insulin Glargine (BASAGLAR KWIKPEN) 100 UNIT/ML Inject 30 Units into the skin daily.   Insulin Pen Needle (BD PEN NEEDLE NANO U/F) 32G  X 4 MM MISC USE DAILY AS DIRECTED   isosorbide mononitrate (IMDUR) 60 MG 24 hr tablet TAKE 1 TABLET BY MOUTH DAILY   levothyroxine (SYNTHROID) 125 MCG tablet TAKE 1 TABLET EVERY DAY ON EMPTY STOMACHWITH A GLASS OF WATER AT LEAST 30-60 MINBEFORE BREAKFAST   losartan (COZAAR) 100 MG tablet Take 1 tablet (100 mg total) by mouth daily.   Lysine 500 MG CAPS Take 1 capsule by mouth daily.   oxyCODONE (OXY IR/ROXICODONE) 5 MG immediate release tablet Take 1 tablet (5 mg total) by mouth daily as needed for severe pain. (Patient not taking: Reported on 05/05/2020)   senna (SENOKOT) 8.6 MG tablet Take by mouth.   SENNA PO Take 1 capsule by mouth in the morning, at noon, in the evening, and at bedtime.   SYMBICORT 80-4.5 MCG/ACT inhaler INHALE 2 PUFFS INTO LUNGS TWICE DAILY   Vitamin D, Cholecalciferol, 10 MCG (400 UNIT) TABS Take by mouth.   No facility-administered medications prior to visit.    Review of Systems  {Labs  Heme  Chem  Endocrine  Serology  Results Review (optional):23779}   Objective    There were no vitals taken for this visit. {Show previous vital signs (optional):23777}   Physical Exam  ***  No results found for any visits on 11/12/20.  Assessment & Plan     ***  No follow-ups on file.      {provider attestation***:1}   Lelon Huh, MD  [facility] [phone] (phone) [phone] (fax)  [place]

## 2020-11-12 NOTE — Progress Notes (Signed)
Established patient visit   Patient: Jacob Herrera   DOB: 06-28-1950   70 y.o. Male  MRN: [id number] Visit Date: 11/12/2020  Today's healthcare provider: Lelon Huh, MD   Chief Complaint  Patient presents with   Diabetes   Hypertension   Hyperlipidemia    Subjective    HPI  Diabetes Mellitus Type II, Follow-up  Lab Results  Component Value Date   HGBA1C 6.1 (A) 07/13/2020   HGBA1C 6.7 (A) 03/15/2020   HGBA1C 6.4 (A) 10/20/2019   Wt Readings from Last 3 Encounters:  11/12/20 260 lb (117.9 kg)  07/13/20 273 lb (123.8 kg)  06/02/20 268 lb (121.6 kg)   Last seen for diabetes 4 months ago.  Management since then includes no changes. He reports excellent compliance with treatment. He is not having side effects.  Symptoms: No fatigue No foot ulcerations  No appetite changes No nausea  No paresthesia of the feet  No polydipsia  No polyuria No visual disturbances   No vomiting     Home blood sugar records: fasting range: low mid 100's  Episodes of hypoglycemia? No    Current insulin regiment: 35 units Basaglar and SS Novolog Most Recent Eye Exam: 03/29/2020 Current exercise: none Current diet habits: in general, a "healthy" diet    Pertinent Labs: Lab Results  Component Value Date   CHOL 119 05/26/2019   HDL 33 (L) 05/26/2019   LDLCALC 46 05/26/2019   TRIG 252 (H) 05/26/2019   CHOLHDL 3.6 05/26/2019   Lab Results  Component Value Date   NA 137 10/07/2018   K 3.8 10/07/2018   CREATININE 1.85 (H) 10/07/2018   GFRNONAA 36 (L) 10/07/2018   GFRAA 42 (L) 10/07/2018   GLUCOSE 134 (H) 10/07/2018     ---------------------------------------------------------------------------------------------------    Medications: Outpatient Medications Prior to Visit  Medication Sig   albuterol (PROVENTIL) (2.5 MG/3ML) 0.083% nebulizer solution Take 3 mLs (2.5 mg total) by nebulization every 4 (four) hours. And PRN (Patient taking differently: Take 2.5 mg by  nebulization as needed.)   amLODipine (NORVASC) 10 MG tablet Take 1 tablet (10 mg total) by mouth daily.   aspirin 81 MG tablet Take 81 mg by mouth daily.   atorvastatin (LIPITOR) 80 MG tablet TAKE ONE TABLET AT BEDTIME   carvedilol (COREG) 25 MG tablet TAKE 1 TABLET BY MOUTH TWICE DAILY   Cholecalciferol (VITAMIN D3 PO) Take 1,000 Units by mouth daily.    cloNIDine (CATAPRES) 0.2 MG tablet TAKE 1 TABLET BY MOUTH TWICE DAILY   clopidogrel (PLAVIX) 75 MG tablet Take 1 tablet (75 mg total) by mouth daily.   Cyanocobalamin (VITAMIN B 12 PO) Take 1,000 mcg by mouth daily.    doxazosin (CARDURA) 8 MG tablet TAKE ONE TABLET BY MOUTH EVERY DAY -STOP FLOMAX-   Dulaglutide (TRULICITY) 1.5 NO/7.0JG SOPN Inject 1.5 mg into the skin once a week.   erythromycin ophthalmic ointment Apply 1 a small amount into right eye every night   finasteride (PROSCAR) 5 MG tablet TAKE ONE TABLET EVERY DAY   fluticasone (FLONASE) 50 MCG/ACT nasal spray USE TWO SPRAYS IN EACH NOSTRIL DAILY   furosemide (LASIX) 40 MG tablet Take 40 mg by mouth 2 (two) times daily.    gabapentin (NEURONTIN) 600 MG tablet TAKE 1 TABLET BY MOUTH 2 TIMES DAILY   glucose blood (ONETOUCH ULTRA) test strip USE AS DIRECTED THREE TIMES A DAY   HUMALOG KWIKPEN 100 UNIT/ML KwikPen Inject 6-10 Units into the skin 3 (  three) times daily. Sliding scale   hydrALAZINE (APRESOLINE) 100 MG tablet TAKE ONE TABLET BY MOUTH 3 TIMES DAILY   Insulin Glargine (BASAGLAR KWIKPEN) 100 UNIT/ML Inject 30 Units into the skin daily.   Insulin Pen Needle (BD PEN NEEDLE NANO U/F) 32G X 4 MM MISC USE DAILY AS DIRECTED   isosorbide mononitrate (IMDUR) 60 MG 24 hr tablet TAKE 1 TABLET BY MOUTH DAILY   levothyroxine (SYNTHROID) 125 MCG tablet TAKE 1 TABLET EVERY DAY ON EMPTY STOMACHWITH A GLASS OF WATER AT LEAST 30-60 MINBEFORE BREAKFAST   losartan (COZAAR) 100 MG tablet Take 1 tablet (100 mg total) by mouth daily.   Lysine 500 MG CAPS Take 1 capsule by mouth daily.    oxyCODONE (OXY IR/ROXICODONE) 5 MG immediate release tablet Take 1 tablet (5 mg total) by mouth daily as needed for severe pain.   senna (SENOKOT) 8.6 MG tablet Take by mouth.   SYMBICORT 80-4.5 MCG/ACT inhaler INHALE 2 PUFFS INTO LUNGS TWICE DAILY   Vitamin D, Cholecalciferol, 10 MCG (400 UNIT) TABS Take by mouth.   SENNA PO Take 1 capsule by mouth in the morning, at noon, in the evening, and at bedtime.   No facility-administered medications prior to visit.    Review of Systems  Constitutional: Negative.   Respiratory: Negative.    Cardiovascular: Negative.   Gastrointestinal: Negative.   Endocrine: Negative.   Musculoskeletal:  Positive for back pain.  Skin:  Negative for wound.  Neurological:  Positive for numbness. Negative for dizziness, light-headedness and headaches.      Objective    BP 133/60 (BP Location: Right Arm, Patient Position: Sitting, Cuff Size: Large)   Pulse 61   Wt 260 lb (117.9 kg)   SpO2 98%   BMI 39.53 kg/m     Physical Exam   General: Appearance:    Obese male in no acute distress  Eyes:    PERRL, conjunctiva/corneas clear, EOM's intact       Lungs:     Clear to auscultation bilaterally, respirations unlabored  Heart:    Normal heart rate. Normal rhythm. No murmurs, rubs, or gallops.    MS:   All extremities are intact.    Neurologic:   Awake, alert, oriented x 3. No apparent focal neurological defect.        Results for orders placed or performed in visit on 11/12/20  POCT glycosylated hemoglobin (Hb A1C)  Result Value Ref Range   Hemoglobin A1C 6.3 (A) 4.0 - 5.6 %    Assessment & Plan     1. Diabetes mellitus with nephropathy ([place]) Very well controlled. Continue current medications.   - Hemoglobin A1c  2. Hyperlipidemia, unspecified hyperlipidemia type He is tolerating atorvastatin well with no adverse effects.   - Lipid panel  3. Prostate cancer screening  - PSA Total (Reflex To Free) ([facility] only)  4. Hypothyroidism,  unspecified type  - T4, free - TSH  5. Colon cancer screening  - Ambulatory referral to Gastroenterology  6. Chronic diastolic heart failure ([place]) Follow up cardiology as scheduled.   7. Morbid obesity ([place]) Doing well and losing weight on Trulicity. Discussed option of increasing dose but he prefers to continue current dose since he is tolerating medications very well.   8. Secondary hyperparathyroidism of renal origin ([place]) Stable, continue routine follow up nephrology.     Future Appointments  Date Time Provider [place]  11/19/2020 12:00 PM [name], MD [facility] None  05/09/2021  2:00 PM Tiant [place],  [name], MD [facility]        The entirety of the information documented in the History of Present Illness, Review of Systems and Physical Exam were personally obtained by me. Portions of this information were initially documented by the CMA and reviewed by me for thoroughness and accuracy.     Lelon Huh, MD  [facility] [phone] (phone) [phone] (fax)  [employer]

## 2020-11-13 LAB — LIPID PANEL
Chol/HDL Ratio: 3.6 ratio (ref 0.0–5.0)
Cholesterol, Total: 119 mg/dL (ref 100–199)
HDL: 33 mg/dL — ABNORMAL LOW (ref >39)
LDL Chol Calc (NIH): 52 mg/dL (ref 0–99)
Triglycerides: 209 mg/dL — ABNORMAL HIGH (ref 0–149)
VLDL Cholesterol Cal: 34 mg/dL (ref 5–40)

## 2020-11-13 LAB — PSA TOTAL (REFLEX TO FREE): Prostate Specific Ag, Serum: 1.4 ng/mL (ref 0.0–4.0)

## 2020-11-13 LAB — HEMOGLOBIN A1C
Est. average glucose Bld gHb Est-mCnc: 131 mg/dL
Hgb A1c MFr Bld: 6.2 % — ABNORMAL HIGH (ref 4.8–5.6)

## 2020-11-13 LAB — TSH: TSH: 3.38 u[IU]/mL (ref 0.450–4.500)

## 2020-11-13 LAB — T4, FREE: Free T4: 1.3 ng/dL (ref 0.82–1.77)

## 2020-11-15 DIAGNOSIS — I129 Hypertensive chronic kidney disease with stage 1 through stage 4 chronic kidney disease, or unspecified chronic kidney disease: Secondary | ICD-10-CM | POA: Diagnosis not present

## 2020-11-15 DIAGNOSIS — N2581 Secondary hyperparathyroidism of renal origin: Secondary | ICD-10-CM | POA: Diagnosis not present

## 2020-11-15 DIAGNOSIS — R809 Proteinuria, unspecified: Secondary | ICD-10-CM | POA: Diagnosis not present

## 2020-11-15 DIAGNOSIS — N041 Nephrotic syndrome with focal and segmental glomerular lesions: Secondary | ICD-10-CM | POA: Diagnosis not present

## 2020-11-15 DIAGNOSIS — N1832 Chronic kidney disease, stage 3b: Secondary | ICD-10-CM | POA: Diagnosis not present

## 2020-11-17 ENCOUNTER — Telehealth: Payer: Self-pay

## 2020-11-17 NOTE — Progress Notes (Signed)
Spoke to patient to reschedule patient office appointment that was on 10/15/2020 with the clinical pharmacist.  Patient states he was unaware that he schedule appointment on 10/15/2020, and would like for the clinical pharmacist to remove the no show.Patient agreed to reschedule a telephone follow up with the clinical pharmacist on 01/04/2021 at 1:00 pm.Patient states he is doing good, lost 13 pounds by changing his diet.Patient reports eating healthier and having better portion control. Patient states all his lab that were done on 11/12/2020 came back good.  [place] Pharmacist Assistant [phone]

## 2020-11-19 ENCOUNTER — Other Ambulatory Visit: Payer: Self-pay

## 2020-11-19 ENCOUNTER — Encounter: Payer: Self-pay | Admitting: Urology

## 2020-11-19 ENCOUNTER — Ambulatory Visit: Payer: PPO | Admitting: Urology

## 2020-11-19 VITALS — BP 113/58 | HR 69 | Ht 68.0 in | Wt 260.0 lb

## 2020-11-19 DIAGNOSIS — N401 Enlarged prostate with lower urinary tract symptoms: Secondary | ICD-10-CM | POA: Diagnosis not present

## 2020-11-19 LAB — BLADDER SCAN AMB NON-IMAGING: Scan Result: 22

## 2020-11-19 MED ORDER — FINASTERIDE 5 MG PO TABS: 5.0000 mg | ORAL_TABLET | Freq: Every day | ORAL | 2 refills | Status: DC

## 2020-11-19 NOTE — Progress Notes (Signed)
11/19/2020 11:53 AM   Jacob Herrera 04/05/1951 [id number]  Referring provider: [name], [place] [address] [place],  [place] [address]  Chief Complaint  Patient presents with   Benign Prostatic Hypertrophy     Urologic history: 1.  BPH with lower urinary tract symptoms -Combination therapy doxazosin 8 mg/finasteride   HPI: 70 y.o. male presents for annual follow-up.  No significant change since last years visit Has urgency and rare urge incontinence when he turns on water Remains on doxazosin/finasteride PSA [facility] 11/12/2020 1.4 Denies dysuria, gross hematuria No flank, abdominal or pelvic pain   PMH: Past Medical History:  Diagnosis Date   Asthma    CHF (congestive heart failure) (HCC)    Chronic lower back pain    Complication of anesthesia    unknow "difficulty" after cardiac cath   Diabetes mellitus, type 2 ([place])    History of chicken pox    Hx of skin cancer, basal cell    Hypertension    Hypothyroidism    Kidney disease    stage 3   Neuropathy    feet   Scoliosis    Sleep apnea    BiPAP   Thyrotoxicosis    2010    Surgical History: Past Surgical History:  Procedure Laterality Date   CARDIAC CATHETERIZATION N/A 03/31/2015   Procedure: Left Heart Cath and Coronary Angiography;  Surgeon: Teodoro Spray, MD;  Location: [facility];  Service: Cardiovascular;  Laterality: N/A;   CARDIAC CATHETERIZATION N/A 03/31/2015   Procedure: Coronary Stent Intervention;  Surgeon: Teodoro Spray, MD;  Location: [facility];  Service: Cardiovascular;  Laterality: N/A;   CARDIAC CATHETERIZATION N/A 03/31/2015   Procedure: Coronary Stent Intervention;  Surgeon: Yolonda Kida, MD;  Location: [facility];  Service: Cardiovascular;  Laterality: N/A;   CATARACT EXTRACTION W/PHACO Right 05/11/2020   Procedure: CATARACT EXTRACTION PHACO AND INTRAOCULAR LENS PLACEMENT (IOC) RIGHT DIABETIC 6.52 01:11.6  9.1%;  Surgeon: Leandrew Koyanagi, MD;  Location: [facility];  Service: Ophthalmology;  Laterality: Right;  Diabetic - insulin sleep apnea   CATARACT EXTRACTION W/PHACO Left 06/02/2020   Procedure: CATARACT EXTRACTION PHACO AND INTRAOCULAR LENS PLACEMENT ([place]) LEFT DIABETIC;  Surgeon: Leandrew Koyanagi, MD;  Location: [facility];  Service: Ophthalmology;  Laterality: Left;  3.14 0:45.0 7.0%   MOHS SURGERY Left 05/12/2019   Left nares by [name], MD [place]  2005   TONSILLECTOMY AND ADENOIDECTOMY  1950    Home Medications:  Allergies as of 11/19/2020       Reactions   Chlorthalidone    D/c by dr. Juleen China for worsening renal function 03/2019   Spironolactone    gynecomastia        Medication List        Accurate as of November 19, 2020 11:53 AM. If you have any questions, ask your nurse or doctor.          albuterol (2.5 MG/3ML) 0.083% nebulizer solution Commonly known as: PROVENTIL Take 3 mLs (2.5 mg total) by nebulization every 4 (four) hours. And PRN What changed:  when to take this reasons to take this additional instructions   amLODipine 10 MG tablet Commonly known as: NORVASC Take 1 tablet (10 mg total) by mouth daily.   aspirin 81 MG tablet Take 81 mg by mouth daily.   atorvastatin 80 MG tablet Commonly known as: LIPITOR TAKE ONE TABLET AT  BEDTIME   Basaglar KwikPen 100 UNIT/ML Inject 30 Units into the skin daily.   BD Pen Needle Nano U/F 32G X 4 MM Misc Generic drug: Insulin Pen Needle USE DAILY AS DIRECTED   calcitRIOL 0.25 MCG capsule Commonly known as: ROCALTROL Take 0.25 mcg by mouth daily.   carvedilol 25 MG tablet Commonly known as: COREG TAKE 1 TABLET BY MOUTH TWICE DAILY   cloNIDine 0.2 MG tablet Commonly known as: CATAPRES TAKE 1 TABLET BY MOUTH TWICE DAILY   clopidogrel 75 MG tablet Commonly known as: Plavix Take 1 tablet (75 mg total) by mouth daily.   doxazosin 8 MG  tablet Commonly known as: CARDURA TAKE ONE TABLET BY MOUTH EVERY DAY -STOP FLOMAX-   erythromycin ophthalmic ointment Apply 1 a small amount into right eye every night   finasteride 5 MG tablet Commonly known as: PROSCAR TAKE ONE TABLET EVERY DAY   fluticasone 50 MCG/ACT nasal spray Commonly known as: FLONASE USE TWO SPRAYS IN EACH NOSTRIL DAILY   furosemide 40 MG tablet Commonly known as: LASIX Take 40 mg by mouth 2 (two) times daily.   gabapentin 600 MG tablet Commonly known as: NEURONTIN TAKE 1 TABLET BY MOUTH 2 TIMES DAILY   HumaLOG KwikPen 100 UNIT/ML KwikPen Generic drug: insulin lispro Inject 6-10 Units into the skin 3 (three) times daily. Sliding scale   hydrALAZINE 100 MG tablet Commonly known as: APRESOLINE TAKE ONE TABLET BY MOUTH 3 TIMES DAILY   Invokana 100 MG Tabs tablet Generic drug: canagliflozin Take 100 mg by mouth daily.   isosorbide mononitrate 60 MG 24 hr tablet Commonly known as: IMDUR TAKE 1 TABLET BY MOUTH DAILY   levothyroxine 125 MCG tablet Commonly known as: SYNTHROID TAKE 1 TABLET EVERY DAY ON EMPTY STOMACHWITH A GLASS OF WATER AT LEAST 30-60 MINBEFORE BREAKFAST   losartan 100 MG tablet Commonly known as: COZAAR Take 1 tablet (100 mg total) by mouth daily.   Lysine 500 MG Caps Take 1 capsule by mouth daily.   OneTouch Ultra test strip Generic drug: glucose blood USE AS DIRECTED THREE TIMES A DAY   oxyCODONE 5 MG immediate release tablet Commonly known as: Oxy IR/ROXICODONE Take 1 tablet (5 mg total) by mouth daily as needed for severe pain.   senna 8.6 MG tablet Commonly known as: SENOKOT Take by mouth.   SENNA PO Take 1 capsule by mouth in the morning, at noon, in the evening, and at bedtime.   Symbicort 80-4.5 MCG/ACT inhaler Generic drug: budesonide-formoterol INHALE 2 PUFFS INTO LUNGS TWICE DAILY   Trulicity 1.5 DP/8.2UM Sopn Generic drug: Dulaglutide Inject 1.5 mg into the skin once a week.   VITAMIN B 12  PO Take 1,000 mcg by mouth daily.   VITAMIN D3 PO Take 1,000 Units by mouth daily.   Vitamin D (Cholecalciferol) 10 MCG (400 UNIT) Tabs Take by mouth.        Allergies:  Allergies  Allergen Reactions   Chlorthalidone     D/c by dr. Juleen China for worsening renal function 03/2019   Spironolactone     gynecomastia    Family History: Family History  Problem Relation Age of Onset   Emphysema Father    Cancer - Lung Father    Cancer - Ovarian Mother        25's   Breast cancer Mother        late 15's    Social History:  reports that he quit smoking about 32 years ago. His smoking use included  cigarettes. He has a 25.00 pack-year smoking history. His smokeless tobacco use includes chew. He reports that he does not drink alcohol and does not use drugs.   Physical Exam: BP (!) 113/58   Pulse 69   Ht 5\' 8"  (1.727 m)   Wt 260 lb (117.9 kg)   BMI 39.53 kg/m   Constitutional:  Alert, No acute distress. HEENT: [place] AT, moist mucus membranes.  Trachea midline, no masses. Cardiovascular: No clubbing, cyanosis, or edema. Respiratory: Normal respiratory effort, no increased work of breathing. Psychiatric: Normal mood and affect.   Assessment & Plan:    1. Benign prostatic hyperplasia with LUTS Stable voiding symptoms on combination therapy Finasteride was refilled Doxazosin is managed by PCP Urgency with running water not bothersome enough that he desires additional medication Bladder scan PVR 26 mL Continue annual follow-up   [name], MD  [facility] [address], [place], [place] [address] [phone]

## 2020-11-26 ENCOUNTER — Other Ambulatory Visit: Payer: Self-pay | Admitting: Family Medicine

## 2020-11-30 DIAGNOSIS — M5416 Radiculopathy, lumbar region: Secondary | ICD-10-CM | POA: Diagnosis not present

## 2020-11-30 DIAGNOSIS — M48062 Spinal stenosis, lumbar region with neurogenic claudication: Secondary | ICD-10-CM | POA: Diagnosis not present

## 2020-12-16 ENCOUNTER — Other Ambulatory Visit: Payer: Self-pay | Admitting: Family Medicine

## 2020-12-16 DIAGNOSIS — J45909 Unspecified asthma, uncomplicated: Secondary | ICD-10-CM | POA: Diagnosis not present

## 2020-12-16 DIAGNOSIS — G4733 Obstructive sleep apnea (adult) (pediatric): Secondary | ICD-10-CM | POA: Diagnosis not present

## 2020-12-16 DIAGNOSIS — I10 Essential (primary) hypertension: Secondary | ICD-10-CM

## 2020-12-17 ENCOUNTER — Other Ambulatory Visit: Payer: Self-pay | Admitting: Family Medicine

## 2020-12-17 DIAGNOSIS — I10 Essential (primary) hypertension: Secondary | ICD-10-CM

## 2020-12-20 ENCOUNTER — Other Ambulatory Visit: Payer: Self-pay | Admitting: Family Medicine

## 2020-12-20 DIAGNOSIS — I5032 Chronic diastolic (congestive) heart failure: Secondary | ICD-10-CM | POA: Diagnosis not present

## 2020-12-20 DIAGNOSIS — E785 Hyperlipidemia, unspecified: Secondary | ICD-10-CM

## 2020-12-20 DIAGNOSIS — G4733 Obstructive sleep apnea (adult) (pediatric): Secondary | ICD-10-CM | POA: Diagnosis not present

## 2020-12-20 DIAGNOSIS — I25110 Atherosclerotic heart disease of native coronary artery with unstable angina pectoris: Secondary | ICD-10-CM | POA: Diagnosis not present

## 2020-12-20 DIAGNOSIS — I10 Essential (primary) hypertension: Secondary | ICD-10-CM | POA: Diagnosis not present

## 2020-12-21 DIAGNOSIS — M4807 Spinal stenosis, lumbosacral region: Secondary | ICD-10-CM | POA: Diagnosis not present

## 2020-12-21 DIAGNOSIS — M5416 Radiculopathy, lumbar region: Secondary | ICD-10-CM | POA: Diagnosis not present

## 2020-12-21 DIAGNOSIS — M48062 Spinal stenosis, lumbar region with neurogenic claudication: Secondary | ICD-10-CM | POA: Diagnosis not present

## 2020-12-21 DIAGNOSIS — M5136 Other intervertebral disc degeneration, lumbar region: Secondary | ICD-10-CM | POA: Diagnosis not present

## 2021-01-03 ENCOUNTER — Telehealth: Payer: Self-pay

## 2021-01-03 NOTE — Progress Notes (Signed)
APPOINTMENT REMINDER   Kadeen Sroka was reminded to have all medications, supplements and any blood glucose and blood pressure readings available for review with [name] , Pharm. D, at his telephone visit on 01/04/2021 at 1:00 pm .  Patient Confirm appointment.  Questions: Have you had any recent office visit or specialist visit outside of [place]? No Are there any concerns you would like to discuss during your office visit? Patient denies any issue/side effects from his current medications.   [facility] Pharmacist Assistant [phone]

## 2021-01-04 ENCOUNTER — Ambulatory Visit (INDEPENDENT_AMBULATORY_CARE_PROVIDER_SITE_OTHER): Payer: PPO

## 2021-01-04 DIAGNOSIS — N1832 Chronic kidney disease, stage 3b: Secondary | ICD-10-CM

## 2021-01-04 DIAGNOSIS — Z794 Long term (current) use of insulin: Secondary | ICD-10-CM

## 2021-01-04 DIAGNOSIS — I5032 Chronic diastolic (congestive) heart failure: Secondary | ICD-10-CM

## 2021-01-04 NOTE — Progress Notes (Signed)
Chronic Care Management Pharmacy Note  01/04/2021 Name:  Jacob Herrera MRN:  [id number] DOB:  Nov 12, 1950  Summary: Patient presents for CCM follow-up. He is doing well and reports weight loss of 27 pounds since April. He was having dizziness, but has improved since switching lisinopril to PM administration. Tolerating Invokana well.   Recommendations/Changes made from today's visit: Continue current medications  -INCREASE BG monitoring to twice daily (before breakfast and either before lunch, supper, or bedtime) -Will start PAP for Invokana  -INCREASE monitoring to once weekly  Plan: CPP follow-up 3 months  Subjective: Jacob Herrera is an 70 y.o. year old male who is a primary patient of [name], [name], MD.  The CCM team was consulted for assistance with disease management and care coordination needs.    Engaged with patient face to face for follow up visit in response to provider referral for pharmacy case management and/or care coordination services.   Consent to Services:  The patient was given information about Chronic Care Management services, agreed to services, and gave verbal consent prior to initiation of services.  Please see initial visit note for detailed documentation.   Patient Care Team: [name], MD as PCP - General (Family Medicine) Manya Silvas, MD (Inactive) as Consulting Physician (Gastroenterology) [name], MD as Consulting Physician (Nephrology) Ubaldo Glassing Javier Docker, MD as Consulting Physician (Cardiology) [name], MD as Referring Physician (Dermatology) [name], MD (Urology) [name], [facility] (Pharmacist)  Recent office visits: 11/12/20: patient presented to Dr. Caryn Section for follow-up. A1c 6.1%.    Recent consult visits: 12/20/20: Patient presented to Dr. Ubaldo Glassing (Cardiology) for follow-up. Move losartan to PM.  11/15/20: Patient presented to Dr. Juleen China (Nephrology) for follow-up. Invokana 100 mg daily.   06/17/20: Patient presented to Dr. Ubaldo Glassing (Cardiology) for follow-up. No medication changes made.   Hospital visits: 06/02/20: Patient hospitalized for cataract extraction.  05/11/20: Patient hospitalized for cataract extraction.   Objective:  Lab Results  Component Value Date   CREATININE 2.1 (A) 11/08/2020   BUN 34 (A) 11/08/2020   GFRNONAA 33 11/08/2020   GFRAA 42 (L) 10/07/2018   NA 139 11/08/2020   K 4.5 11/08/2020   CALCIUM 10.3 11/08/2020   CO2 22 11/08/2020   GLUCOSE 134 (H) 10/07/2018    Lab Results  Component Value Date/Time   HGBA1C 6.2 (H) 11/12/2020 02:14 PM   HGBA1C 6.3 (A) 11/12/2020 01:50 PM   HGBA1C 6.1 (A) 07/13/2020 01:56 PM   HGBA1C 6.3 (H) 03/20/2017 03:03 PM   MICROALBUR neg 03/20/2017 02:25 PM    Last diabetic Eye exam:  Lab Results  Component Value Date/Time   HMDIABEYEEXA No Retinopathy 03/29/2020 12:00 AM    Last diabetic Foot exam: No results found for: HMDIABFOOTEX   Lab Results  Component Value Date   CHOL 119 11/12/2020   HDL 33 (L) 11/12/2020   LDLCALC 52 11/12/2020   TRIG 209 (H) 11/12/2020   CHOLHDL 3.6 11/12/2020    Hepatic Function Latest Ref Rng & Units 11/08/2020 10/07/2018 12/21/2016  Total Protein 6.0 - 8.5 g/dL - 6.6 7.0  Albumin 3.5 - 5.0 3.9 4.2 4.3  AST 0 - 40 IU/L - 23 19  ALT 0 - 44 IU/L - 17 21  Alk Phosphatase 39 - 117 IU/L - 47 68  Total Bilirubin 0.0 - 1.2 mg/dL - 0.2 0.2    Lab Results  Component Value Date/Time   TSH 3.380 11/12/2020 02:14 PM   TSH 2.720 05/26/2019 04:16  PM   FREET4 1.30 11/12/2020 02:14 PM    CBC Latest Ref Rng & Units 11/08/2020 11/09/2014 07/08/2013  WBC - 6.1 6.6 8.0  Hemoglobin 13.5 - 17.5 10.7(A) 12.4(L) 13.5  Hematocrit 41 - 53 32(A) 38.9 41.7  Platelets 150 - 399 249 241 220    Lab Results  Component Value Date/Time   VD25OH 22.8 (L) 05/26/2019 04:16 PM    Clinical ASCVD: No  The ASCVD Risk score (Arnett DK, et al., 2019) failed to calculate for the following reasons:   The  valid total cholesterol range is 130 to 320 mg/dL    Depression screen [facility] 2/9 11/12/2020 03/15/2020 04/30/2018  Decreased Interest 0 0 3  Down, Depressed, Hopeless 0 0 0  PHQ - 2 Score 0 0 3  Altered sleeping 1 0 2  Tired, decreased energy 2 0 3  Change in appetite 0 0 0  Feeling bad or failure about yourself  0 0 0  Trouble concentrating 0 0 1  Moving slowly or fidgety/restless 0 0 0  Suicidal thoughts 0 0 0  PHQ-9 Score 3 0 9  Difficult doing work/chores Not difficult at all Not difficult at all Not difficult at all    Social History   Tobacco Use  Smoking Status Former   Packs/day: 1.00   Years: 25.00   Pack years: 25.00   Types: Cigarettes   Quit date: 05/29/1988   Years since quitting: 32.6  Smokeless Tobacco Current   Types: Chew  Tobacco Comments   chews 1 bag of loose leaf chew/week   BP Readings from Last 3 Encounters:  11/19/20 (!) 113/58  11/12/20 133/60  07/13/20 (!) 150/73   Pulse Readings from Last 3 Encounters:  11/19/20 69  11/12/20 61  07/13/20 (!) 58   Wt Readings from Last 3 Encounters:  11/19/20 260 lb (117.9 kg)  11/12/20 260 lb (117.9 kg)  07/13/20 273 lb (123.8 kg)   BMI Readings from Last 3 Encounters:  11/19/20 39.53 kg/m  11/12/20 39.53 kg/m  07/13/20 41.51 kg/m    Assessment/Interventions: Review of patient past medical history, allergies, medications, health status, including review of consultants reports, laboratory and other test data, was performed as part of comprehensive evaluation and provision of chronic care management services.   SDOH:  (Social Determinants of Health) assessments and interventions performed: Yes SDOH Interventions    Flowsheet Row Most Recent Value  SDOH Interventions   Financial Strain Interventions Intervention Not Indicated       CCM Care Plan  Allergies  Allergen Reactions   Chlorthalidone     D/c by dr. Juleen China for worsening renal function 03/2019   Spironolactone     gynecomastia     Medications Reviewed Today     Reviewed by Kyra Manges, CMA (Certified Medical Assistant) on 11/19/20 at 1139  Med List Status: <None>   Medication Order Taking? Sig Documenting Provider Last Dose Status Informant  albuterol (PROVENTIL) (2.5 MG/3ML) 0.083% nebulizer solution [id number]  Take 3 mLs (2.5 mg total) by nebulization every 4 (four) hours. And PRN  Patient taking differently: Take 2.5 mg by nebulization as needed.   Flora Lipps, MD  Active            Med Note [facility], Ria Comment S   Tue May 11, 2020  8:29 AM) PRN, hasnt had to use in 2-3 months  amLODipine (NORVASC) 10 MG tablet [id number]  Take 1 tablet (10 mg total) by mouth daily. Teodoro Spray, MD  Active  aspirin 81 MG tablet [id number]  Take 81 mg by mouth daily. [provider]  Active   atorvastatin (LIPITOR) 80 MG tablet [id number]  TAKE ONE TABLET AT BEDTIME [name], MD  Active   calcitRIOL (ROCALTROL) 0.25 MCG capsule [id number]  Take 0.25 mcg by mouth daily. [provider]  Active   carvedilol (COREG) 25 MG tablet [id number]  TAKE 1 TABLET BY MOUTH TWICE DAILY [name], MD  Active   Cholecalciferol (VITAMIN D3 PO) [id number]  Take 1,000 Units by mouth daily.  [provider]  Active Self  cloNIDine (CATAPRES) 0.2 MG tablet [id number]  TAKE 1 TABLET BY MOUTH TWICE DAILY [name], [name], MD  Active   clopidogrel (PLAVIX) 75 MG tablet [id number]  Take 1 tablet (75 mg total) by mouth daily. Teodoro Spray, MD  Active   Cyanocobalamin (VITAMIN B 12 PO) [id number]  Take 1,000 mcg by mouth daily.  [provider]  Active Self  doxazosin (CARDURA) 8 MG tablet [id number]  TAKE ONE TABLET BY MOUTH EVERY DAY -STOP FLOMAX- [name], MD  Active   Dulaglutide (TRULICITY) 1.5 KL/4.9ZP SOPN [id number]  Inject 1.5 mg into the skin once a week. [name], MD  Active   erythromycin ophthalmic ointment [id number]  Apply 1 a small amount into right eye every night  [provider]  Active   finasteride (PROSCAR) 5 MG tablet [id number]  TAKE ONE TABLET EVERY DAY Stoioff, Ronda Fairly, MD  Active   fluticasone (FLONASE) 50 MCG/ACT nasal spray [id number]  USE TWO SPRAYS IN EACH NOSTRIL DAILY [name], MD  Active   furosemide (LASIX) 40 MG tablet [id number]  Take 40 mg by mouth 2 (two) times daily.  [provider]  Active   gabapentin (NEURONTIN) 600 MG tablet [id number]  TAKE 1 TABLET BY MOUTH 2 TIMES DAILY [name], MD  Active   glucose blood [facility] ULTRA) test strip [id number]  USE AS DIRECTED THREE TIMES A DAY [name], MD  Active   HUMALOG KWIKPEN 100 UNIT/ML KwikPen [id number]  Inject 6-10 Units into the skin 3 (three) times daily. Sliding scale [provider]  Active   hydrALAZINE (APRESOLINE) 100 MG tablet [id number]  TAKE ONE TABLET BY MOUTH 3 TIMES DAILY [name], MD  Active   Insulin Glargine [facility] KWIKPEN) 100 UNIT/ML [id number]  Inject 30 Units into the skin daily. [provider]  Active Self           Med Note [facility], Willey Blade   Tue May 11, 2020  8:35 AM) 18 units this AM    Insulin Pen Needle (BD PEN NEEDLE NANO U/F) 32G X 4 MM MISC [id number]  USE DAILY AS DIRECTED [name], MD  Active   INVOKANA 100 MG TABS tablet [id number]  Take 100 mg by mouth daily. [provider]  Active   isosorbide mononitrate (IMDUR) 60 MG 24 hr tablet [id number]  TAKE 1 TABLET BY MOUTH DAILY [name], MD  Active   levothyroxine (SYNTHROID) 125 MCG tablet [id number]  TAKE 1 TABLET EVERY DAY ON EMPTY STOMACHWITH A GLASS OF WATER AT LEAST 30-60 MINBEFORE BREAKFAST [name], MD  Active   losartan (COZAAR) 100 MG tablet [id number]  Take 1 tablet (100 mg total) by mouth daily. Teodoro Spray, MD  Active   Lysine 500 MG CAPS [id number]  Take 1 capsule by mouth daily. [provider]  Active   oxyCODONE (  OXY IR/ROXICODONE) 5 MG immediate release tablet [id number]  Take 1  tablet (5 mg total) by mouth daily as needed for severe pain. [name], MD  Active   senna (SENOKOT) 8.6 MG tablet [id number]  Take by mouth. [provider]  Active   SENNA PO [id number]  Take 1 capsule by mouth in the morning, at noon, in the evening, and at bedtime. [provider]  Active   SYMBICORT 80-4.5 MCG/ACT inhaler [id number]  INHALE 2 PUFFS INTO LUNGS TWICE DAILY Kasa, Kurian, MD  Active   Vitamin D, Cholecalciferol, 10 MCG (400 UNIT) TABS [id number]  Take by mouth. [provider]  Active   Med List Note Margarita Mail, RN 03/31/15 1507): UDS done 08-11-14 Meds to last until 10-09-14            Patient Active Problem List   Diagnosis Date Noted   Vitamin D deficiency 05/27/2019   Vitamin B12 deficiency 05/27/2019   Benign hypertensive kidney disease with chronic kidney disease 04/23/2019   Nephrotic syndrome, focal and segmental glomerular lesions 04/23/2019   Proteinuria 04/23/2019   Secondary hyperparathyroidism of renal origin ([place]) 04/23/2019   Trigger middle finger of right hand 01/20/2019   Carpal tunnel syndrome, right 01/20/2019   Focal segmental glomerulosclerosis 03/26/2018   Primary osteoarthritis of first carpometacarpal joint of right hand 07/30/2016   Coronary artery disease with unstable angina pectoris ([place]) 03/31/2015   Atherosclerotic heart disease of native coronary artery with unstable angina pectoris ([place]) 65/99/3570   Diastolic heart failure ([place]) 02/15/2015   Regional wall motion abnormality of heart 02/15/2015   Abnormal findings on diagnostic imaging of heart and coronary circulation 02/15/2015   Restrictive lung disease 11/10/2014   History of adenomatous polyp of colon 11/09/2014   Hypothyroidism 11/09/2014   Diabetes mellitus with nephropathy ([place]) 11/09/2014   Hyperlipidemia 11/09/2014   Depression 11/09/2014   Insomnia 11/09/2014   Resistant hypertension 11/09/2014   GERD (gastroesophageal reflux disease)  11/09/2014   Chronic kidney disease (CKD), stage III (moderate) ([place]) 11/09/2014   Dyspnea on exertion 11/09/2014   History of basal cell cancer 09/15/2014   DDD (degenerative disc disease), lumbar 09/09/2014   Facet syndrome, lumbar 09/09/2014   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   Greater trochanteric bursitis of both hips 09/09/2014   Allergic rhinitis 08/12/2014   Benign prostatic hyperplasia with urinary obstruction 06/24/2013   Nocturia 05/30/2013   Frequency of micturition 05/30/2013   Urge incontinence 05/30/2013   Asthma, chronic 05/29/2013   Morbid obesity ([place]) 05/29/2013   OSA treated with BiPAP 05/29/2013   Scoliosis 11/28/2011   SCC (squamous cell carcinoma), face 08/01/2011    Immunization History  Administered Date(s) Administered   Fluad Quad(high Dose 65+) 01/21/2019   Influenza Split 02/26/2013, 01/12/2014   Influenza, High Dose Seasonal PF 02/09/2015, 03/07/2016, 03/20/2017, 02/13/2018, 01/30/2020   PFIZER(Purple Top)SARS-COV-2 Vaccination 06/17/2019, 07/08/2019, 01/30/2020   Pneumococcal Conjugate-13 01/16/2014   Pneumococcal Polysaccharide-23 07/07/2008, 03/07/2016   Td 01/05/2003   Tdap 07/07/2008   Zoster Recombinat (Shingrix) 09/28/2017, 12/21/2017    Conditions to be addressed/monitored:  Hypertension, Hyperlipidemia, Diabetes, Heart Failure, Asthma, Chronic Kidney Disease, Hypothyroidism, Depression, BPH and Insomnia  Care Plan : General Pharmacy (Adult)  Updates made by [name], RPH since 01/04/2021 12:00 AM     Problem: Hypertension, Hyperlipidemia, Diabetes, Heart Failure, Asthma, Chronic Kidney Disease, Hypothyroidism, Depression, BPH and Insomnia   Priority: High     Long-Range Goal: Patient-Specific Goal   Start Date: 07/13/2020  Expected  End Date: 01/04/2022  This Visit's Progress: On track  Recent Progress: On track  Priority: High  Note:   Current Barriers:  Unable to independently afford treatment regimen  Pharmacist  Clinical Goal(s):  Patient will verbalize ability to afford treatment regimen through collaboration with PharmD and provider.   Interventions: 1:1 collaboration with [name], MD regarding development and update of comprehensive plan of care as evidenced by provider attestation and co-signature Inter-disciplinary care team collaboration (see longitudinal plan of care) Comprehensive medication review performed; medication list updated in electronic medical record  Hyperlipidemia: (LDL goal < 70) -Controlled -History of CAD s/p PCI  -Current treatment: Atorvastatin 80 mg daily  -Medications previously tried: NA  -Educated on Importance of limiting foods high in cholesterol; -Recommended to continue current medication  Diabetes (A1c goal <8%) -Controlled -Current medications: Trulicity 1.5 mg weekly  Humalog sliding scale (4-8 units typically) -adjusts based on carb intake  Basaglar 35 units daily  Invokana 100 mg daily  -Medications previously tried: Bydureon, Glipizide, Victoza, metformin, Ozempic, Actos, Januvia  -Current home glucose readings  Fasting  13-Sep 105  12-Sep 124  11-Sep 117  10-Sep 110  9-Sep 134  8-Sep 102  7-Sep 101  6-Sep 104  5-Sep 116  4-Sep 121  3-Sep 116  Average 114  -Denies hypoglycemic/hyperglycemic symptoms -Current exercise: Sedentary. Patient is limited by sciatica, neuropathy pain.  -Patient interested in [facility], wants to defer for now due to frequent doctor's visits. -Recommended to continue current medication -INCREASE BG monitoring to twice daily (before breakfast and either before lunch, supper, or bedtime) -Will start PAP for Invokana   Heart Failure (Goal: manage symptoms and prevent exacerbations) -Controlled -Last ejection fraction: 50% -HF type: Diastolic -NYHA Class: II (slight limitation of activity) -AHA HF Stage: C (Heart disease and symptoms present) -Current treatment: Amlodipine 10 mg daily  Carvedilol  25 mg twice daily  Clonidine 0.3 mg twice daily  Doxazosin 8 mg daily  Furosemide 40 mg twice daily  Hydralazine 100 mg three times daily  Imdur 60 mg daily  Losartan 100 mg nightly  -Medications previously tried: Chlorthalidone, spironolactone, nebivolol   -Current home BP/HR readings: NA, does not monitor at home. -Weight has decreased significantly over past 5 months due to improved eating habits. Patient has occasional dizziness which has improved with switching losartan to nightly.  -Recommended to continue current medication -INCREASE monitoring to once weekly  Hypothyroidism (Goal: Maintain stable thyroid function) -Controlled -Current treatment  Levothyroxine 125 mcg daily before breakfast  -Medications previously tried: NA  -Recommended to continue current medication  Chronic Kidney Disease Stage 3b  -All medications assessed for renal dosing and appropriateness in chronic kidney disease. -Recommended to continue current medication   Patient Goals/Self-Care Activities Patient will:  - check glucose 3-4 times daily: before meals and at bedtime, document, and provide at future appointments check blood pressure weekly, document, and provide at future appointments  Follow Up Plan: Telephone follow up appointment with care management team member scheduled for:  03/29/2021 at 1:30 PM    Medication Assistance:  Humalog, [place], Trulicity obtained through [employer] medication assistance program.  Enrollment ends dec 2022   Patient's preferred pharmacy is:  [place], [place] - [address] [place] [address] Phone: [phone] Fax: [phone]  [id number] [place] [address]Phone: [phone]: [phone] Uses pill box? Yes Pt endorses  100% compliance  We discussed: Current pharmacy is preferred with insurance plan and patient is satisfied with pharmacy  services Patient decided to: Continue current medication management strategy  Care Plan and Follow Up Patient Decision:  Patient agrees to Care Plan and Follow-up.  Plan: Telephone follow up appointment with care management team member scheduled for:  03/29/2021 at 1:30 PM  [place] [phone]

## 2021-01-07 ENCOUNTER — Other Ambulatory Visit: Payer: Self-pay | Admitting: Family Medicine

## 2021-01-07 ENCOUNTER — Telehealth: Payer: Self-pay

## 2021-01-07 NOTE — Progress Notes (Signed)
Chronic Care Management Pharmacy Assistant   Name: Jacob Herrera  MRN: [id number] DOB: 1950/06/10  Reason for Encounter: Medication Review/Patient assistance application for Invokana    Recent office visits:  No recent Office visit  Recent consult visits:  No recent [facility] visits:  None in previous 6 months  Medications: Outpatient Encounter Medications as of 01/07/2021  Medication Sig Note   albuterol (PROVENTIL) (2.5 MG/3ML) 0.083% nebulizer solution Take 3 mLs (2.5 mg total) by nebulization every 4 (four) hours. And PRN (Patient taking differently: Take 2.5 mg by nebulization as needed.) 05/11/2020: PRN, hasnt had to use in 2-3 months   amLODipine (NORVASC) 10 MG tablet Take 1 tablet (10 mg total) by mouth daily.    aspirin 81 MG tablet Take 81 mg by mouth daily.    atorvastatin (LIPITOR) 80 MG tablet TAKE ONE TABLET AT BEDTIME    calcitRIOL (ROCALTROL) 0.25 MCG capsule Take 0.25 mcg by mouth daily.    carvedilol (COREG) 25 MG tablet TAKE 1 TABLET BY MOUTH TWICE DAILY    Cholecalciferol (VITAMIN D3 PO) Take 1,000 Units by mouth daily.     cloNIDine (CATAPRES) 0.2 MG tablet TAKE 1 TABLET BY MOUTH TWICE DAILY    clopidogrel (PLAVIX) 75 MG tablet Take 1 tablet (75 mg total) by mouth daily.    Cyanocobalamin (VITAMIN B 12 PO) Take 1,000 mcg by mouth daily.     doxazosin (CARDURA) 8 MG tablet TAKE ONE TABLET BY MOUTH EVERY DAY -STOP FLOMAX-    Dulaglutide (TRULICITY) 1.5 YW/7.3XT SOPN Inject 1.5 mg into the skin once a week.    erythromycin ophthalmic ointment Apply 1 a small amount into right eye every night    finasteride (PROSCAR) 5 MG tablet Take 1 tablet (5 mg total) by mouth daily.    fluticasone (FLONASE) 50 MCG/ACT nasal spray USE TWO SPRAYS IN EACH NOSTRIL DAILY    furosemide (LASIX) 40 MG tablet Take 40 mg by mouth 2 (two) times daily.     gabapentin (NEURONTIN) 600 MG tablet TAKE 1 TABLET BY MOUTH 2 TIMES DAILY    glucose blood (ONETOUCH ULTRA) test  strip USE AS DIRECTED THREE TIMES A DAY    HUMALOG KWIKPEN 100 UNIT/ML KwikPen Inject 6-10 Units into the skin 3 (three) times daily. Sliding scale    hydrALAZINE (APRESOLINE) 100 MG tablet TAKE ONE TABLET BY MOUTH 3 TIMES DAILY    Insulin Glargine (BASAGLAR KWIKPEN) 100 UNIT/ML Inject 30 Units into the skin daily. 05/11/2020: 18 units this AM     Insulin Pen Needle (BD PEN NEEDLE NANO U/F) 32G X 4 MM MISC USE DAILY AS DIRECTED    INVOKANA 100 MG TABS tablet Take 100 mg by mouth daily.    isosorbide mononitrate (IMDUR) 60 MG 24 hr tablet TAKE 1 TABLET BY MOUTH DAILY    levothyroxine (SYNTHROID) 125 MCG tablet TAKE 1 TABLET EVERY DAY ON EMPTY STOMACHWITH A GLASS OF WATER AT LEAST 30-60 MINBEFORE BREAKFAST    losartan (COZAAR) 100 MG tablet Take 1 tablet (100 mg total) by mouth daily.    Lysine 500 MG CAPS Take 1 capsule by mouth daily.    oxyCODONE (OXY IR/ROXICODONE) 5 MG immediate release tablet Take 1 tablet (5 mg total) by mouth daily as needed for severe pain.    senna (SENOKOT) 8.6 MG tablet Take by mouth.    SENNA PO Take 1 capsule by mouth in the morning, at noon, in the evening, and at bedtime.  SYMBICORT 80-4.5 MCG/ACT inhaler INHALE 2 PUFFS INTO LUNGS TWICE DAILY    Vitamin D, Cholecalciferol, 10 MCG (400 UNIT) TABS Take by mouth.    No facility-administered encounter medications on file as of 01/07/2021.    Care Gaps: Colonoscopy Tetanus Vaccine COVID-19 Vaccine Influenza Vaccine Star Rating Drugs: Atorvastatin 80 mg last filled on 12/20/2020 for 90 day supply at [place]. Losartan 100 mg last filled on 11/05/2020 [facility]. Invokana 100 mg last filled on 12/10/2020 for 30 day supply at [place] Trulicity 1.5 mg  last filled on 06/21/2020 for 28 day [facility] (Patient assistance through [facility]) Medications Fill Gaps: None  Completed Patient assistance application for Invokana, and Email to Clinical  pharmacist to review.   [name] Pharmacist Assistant [phone]

## 2021-01-08 ENCOUNTER — Other Ambulatory Visit: Payer: Self-pay | Admitting: Family Medicine

## 2021-01-11 ENCOUNTER — Telehealth: Payer: Self-pay | Admitting: Pulmonary Disease

## 2021-01-11 NOTE — Telephone Encounter (Signed)
Spoke to patient and requested that he bring SD card.

## 2021-01-12 ENCOUNTER — Other Ambulatory Visit: Payer: Self-pay

## 2021-01-12 ENCOUNTER — Encounter: Payer: Self-pay | Admitting: Internal Medicine

## 2021-01-12 ENCOUNTER — Ambulatory Visit: Payer: PPO | Admitting: Internal Medicine

## 2021-01-12 VITALS — BP 140/64 | HR 68 | Temp 98.3°F | Ht 68.0 in | Wt 247.7 lb

## 2021-01-12 DIAGNOSIS — J9611 Chronic respiratory failure with hypoxia: Secondary | ICD-10-CM | POA: Diagnosis not present

## 2021-01-12 DIAGNOSIS — G4733 Obstructive sleep apnea (adult) (pediatric): Secondary | ICD-10-CM

## 2021-01-12 NOTE — Patient Instructions (Addendum)
Continue BiPAP as prescribed Continue oxygen as prescribed Continue inhalers as prescribed  Will plan for CPAP titration study

## 2021-01-12 NOTE — Progress Notes (Signed)
Subjective:    Patient ID: Jacob Herrera, male    DOB: 1950/07/03, 71 y.o.   MRN: [id number]  Synopsis: Jacob Herrera first saw the [facility] pulmonary clinic in February 2015 for evaluation of shortness of breath. He had a past medical history significant for asthma. Pulmonary function testing was normal with the exception of restriction secondary to obesity. He was started on a combination long acting bronchodilator/inhaled corticosteroid inhaler and he noted significant benefit from this.  Compliance reports as listed below 10/2015  100% compliance Leak 117.4L/min AHI 12.9  06/2016 compliance report 100% compliance Leak 118 L/min AHI 9.1  11/18 100% compliance Leak 24 L/min AHI14  6.18.19 AHI 30 100% compliance No leak IPAP 25 EPAP 5  10/19 AHI 10 100% compliance BiPAP IPAP 25 EPAP 12  02/25/2019 AHI of 10 100% compliance   12/2019 AHI 4.8 100% compliance days and >4 hrs BiPAP IPAP 25 EPAP 12 Minimal leak      CC:  Follow up OSA   HPI  History of OSA History of non-smoker  very compliant with OSA therapy History of CHF   No exacerbation at this time No evidence of heart failure at this time No evidence or signs of infection at this time No respiratory distress No fevers, chills, nausea, vomiting, diarrhea No evidence of lower extremity edema No evidence hemoptysis   Oxygen as needed with BiPAP Patient needs oxygen to survive    Review of Systems  Constitutional:  Negative for chills, fatigue and fever.  HENT:  Negative for postnasal drip.   Respiratory:  Negative for chest tightness, shortness of breath and wheezing.   Cardiovascular:  Positive for leg swelling. Negative for chest pain and palpitations.  Musculoskeletal:  Positive for back pain and joint swelling.  All other systems reviewed and are negative.   BP 140/64 (BP Location: Left Arm, Patient Position: Sitting, Cuff Size: Normal)   Pulse 68   Temp 98.3 F (36.8  C) (Oral)   Ht 5\' 8"  (1.727 m)   Wt 247 lb 11.2 oz (112.4 kg)   SpO2 97%   BMI 37.66 kg/m    Physical Examination:   General Appearance: No distress  EYES PERRLA, EOM intact.   NECK Supple, No JVD Pulmonary: normal breath sounds, No wheezing.  CardiovascularNormal S1,S2.  No m/r/g.   ALL OTHER ROS ARE NEGATIVE     05/2013 Full FPT > ratio 82%, FEV1 1.95L (67% pred), no change with BD, TLC 3.91 L (64% pred), ERV 0.26L (18% pred), DLCO 15.6 (54% pred)     Assessment & Plan:      70 year old morbidly obese white male with severe sleep apnea with a history of cardiomyopathy status post stent with underlying reactive airways disease with chronic kidney disease and chronic hypoxic respiratory failure    Severe sleep apnea Patient is very compliant with his CPAP He benefits and uses from therapy Patient has excellent compliance however his AHI is still 23 Patient will need CPAP titration study for further assessment  Chronic shortness of breath and dyspnea exertion related to morbid obesity underlying sleep apnea reactive airways disease and CHF No indication for prednisone or antibiotics at this time  Cardiomyopathy and hypertension Both are related to underlying sleep apnea and therefore therapy for sleep apnea is essential for treating these diseases  Obesity -recommend significant weight loss -recommend changing diet  Deconditioned state -Recommend increased daily activity and exercise   Chronic hypoxic respiratory failure with OSA and OHS on BiPAP  Patient is and benefits from oxygen therapy He needs this for survival    MEDICATION ADJUSTMENTS/LABS AND TESTS ORDERED: Continue inhaler therapy as prescribed BiPAP as prescribed Patient needs oxygen for survival   CURRENT MEDICATIONS REVIEWED AT LENGTH WITH PATIENT TODAY   Patient satisfied with Plan of action and management. All questions answered  Follow up in 1 year  Total Time Spent 25  mins    Maretta Bees Patricia Pesa, M.D.  [facility]  Medical Director [place] Director [facility] Cardio-Pulmonary Department

## 2021-01-21 DIAGNOSIS — I5032 Chronic diastolic (congestive) heart failure: Secondary | ICD-10-CM | POA: Diagnosis not present

## 2021-01-21 DIAGNOSIS — E1122 Type 2 diabetes mellitus with diabetic chronic kidney disease: Secondary | ICD-10-CM

## 2021-01-21 DIAGNOSIS — N1832 Chronic kidney disease, stage 3b: Secondary | ICD-10-CM | POA: Diagnosis not present

## 2021-01-21 DIAGNOSIS — Z794 Long term (current) use of insulin: Secondary | ICD-10-CM

## 2021-01-24 DIAGNOSIS — E119 Type 2 diabetes mellitus without complications: Secondary | ICD-10-CM | POA: Diagnosis not present

## 2021-01-24 DIAGNOSIS — Z86010 Personal history of colonic polyps: Secondary | ICD-10-CM | POA: Diagnosis not present

## 2021-01-24 DIAGNOSIS — D649 Anemia, unspecified: Secondary | ICD-10-CM | POA: Diagnosis not present

## 2021-01-24 DIAGNOSIS — R195 Other fecal abnormalities: Secondary | ICD-10-CM | POA: Diagnosis not present

## 2021-01-24 LAB — HM DIABETES EYE EXAM

## 2021-02-02 ENCOUNTER — Telehealth: Payer: Self-pay

## 2021-02-02 NOTE — Progress Notes (Addendum)
Chronic Care Management Pharmacy Assistant   Name: Jacob Herrera  MRN: [id number] DOB: 12/10/1950  Reason for Encounter: Medication Review/Patient assistance renewal application for Humalog, Basaglar, Trulicity, and Invokana.   Recent office visits:  No recent Office Visit  Recent consult visits:  01/24/2021 Laurine Blazer PA (Gastroenterology)Stop your iron supplements  01/12/2021 Dr.Kasa MD (Pulmonary)No medication Changes noted, Follow up in 1 year  Hospital visits:  None in previous 6 months  Medications: Outpatient Encounter Medications as of 02/02/2021  Medication Sig Note   albuterol (PROVENTIL) (2.5 MG/3ML) 0.083% nebulizer solution Take 3 mLs (2.5 mg total) by nebulization every 4 (four) hours. And PRN (Patient taking differently: Take 2.5 mg by nebulization as needed.) 05/11/2020: PRN, hasnt had to use in 2-3 months   amLODipine (NORVASC) 10 MG tablet Take 1 tablet (10 mg total) by mouth daily.    aspirin 81 MG tablet Take 81 mg by mouth daily.    atorvastatin (LIPITOR) 80 MG tablet TAKE ONE TABLET AT BEDTIME    calcitRIOL (ROCALTROL) 0.25 MCG capsule Take 0.25 mcg by mouth daily.    carvedilol (COREG) 25 MG tablet TAKE 1 TABLET BY MOUTH TWICE DAILY    Cholecalciferol (VITAMIN D3 PO) Take 1,000 Units by mouth daily.     cloNIDine (CATAPRES) 0.2 MG tablet TAKE 1 TABLET BY MOUTH TWICE DAILY    clopidogrel (PLAVIX) 75 MG tablet Take 1 tablet (75 mg total) by mouth daily.    Cyanocobalamin (VITAMIN B 12 PO) Take 1,000 mcg by mouth daily.     doxazosin (CARDURA) 8 MG tablet TAKE ONE TABLET BY MOUTH EVERY DAY -STOP FLOMAX-    Dulaglutide (TRULICITY) 1.5 DZ/3.2DJ SOPN Inject 1.5 mg into the skin once a week.    erythromycin ophthalmic ointment Apply 1 a small amount into right eye every night    finasteride (PROSCAR) 5 MG tablet Take 1 tablet (5 mg total) by mouth daily.    fluticasone (FLONASE) 50 MCG/ACT nasal spray USE TWO SPRAYS IN EACH NOSTRIL DAILY    furosemide  (LASIX) 40 MG tablet Take 40 mg by mouth 2 (two) times daily.     gabapentin (NEURONTIN) 600 MG tablet TAKE 1 TABLET BY MOUTH 2 TIMES DAILY    HUMALOG KWIKPEN 100 UNIT/ML KwikPen Inject 6-10 Units into the skin 3 (three) times daily. Sliding scale    hydrALAZINE (APRESOLINE) 100 MG tablet TAKE 1 TABLET BY MOUTH 3 TIMES DAILY    Insulin Glargine (BASAGLAR KWIKPEN) 100 UNIT/ML Inject 30 Units into the skin daily. 05/11/2020: 18 units this AM     Insulin Pen Needle (BD PEN NEEDLE NANO U/F) 32G X 4 MM MISC USE DAILY AS DIRECTED    INVOKANA 100 MG TABS tablet Take 100 mg by mouth daily.    isosorbide mononitrate (IMDUR) 60 MG 24 hr tablet TAKE 1 TABLET BY MOUTH DAILY    levothyroxine (SYNTHROID) 125 MCG tablet TAKE 1 TABLET EVERY DAY ON EMPTY STOMACHWITH A GLASS OF WATER AT LEAST 30-60 MINBEFORE BREAKFAST    losartan (COZAAR) 100 MG tablet Take 1 tablet (100 mg total) by mouth daily.    Lysine 500 MG CAPS Take 1 capsule by mouth daily.    ONETOUCH ULTRA test strip USE AS DIRECTED THREE TIMES A DAY    oxyCODONE (OXY IR/ROXICODONE) 5 MG immediate release tablet Take 1 tablet (5 mg total) by mouth daily as needed for severe pain.    senna (SENOKOT) 8.6 MG tablet Take by mouth.    SENNA  PO Take 1 capsule by mouth in the morning, at noon, in the evening, and at bedtime.    SYMBICORT 80-4.5 MCG/ACT inhaler INHALE 2 PUFFS INTO LUNGS TWICE DAILY    Vitamin D, Cholecalciferol, 10 MCG (400 UNIT) TABS Take by mouth.    No facility-administered encounter medications on file as of 02/02/2021.    Care Gaps: Colonoscopy (Last Completed 01/06/2005) Tetanus Vaccine (Last Completed 07/07/2008) COVID-19 Vaccine (4- Booster for [employer] series)  Star Rating Drugs: Atorvastatin 80 mg last filled on 12/20/2020 for 90 day supply at [place]. Losartan 100 mg last filled on 11/05/2020 [facility]. Invokana 100 mg last filled on 01/07/2021 for 30 day supply at [place] Trulicity 1.5 mg  last filled on 06/21/2020 [facility] day [facility] (Patient assistance through [facility]) Medications Fill Gaps: None  I received a task from [name], CPP requesting that I start the process of renewing the application for patient assistance on the medication Humalog, Basaglar, Trulicity, and Invokana for patient to continue to receive assitance through year 2023.    I review patient application that was completed for Humalog, Basaglar, and Trulicity, earlier this year, and made any changes if were needed.  I reach out to [employer] to get a status updated on Invokana, patient was denied for patient assistance for Invokana due to his income.Notified Clinical Pharmacist.   Patient Verbalized understanding.  I also spoke with the patient and he  requested that the applications can be mailed.I informed  him once he receives the application he will need to complete his part of the applications and return it to his PCP office for [name], CPP to fax over to the [facility]   for processing.   Application emailed to [name], CPP for review and to Mail to patient home.   [place] Pharmacist Assistant [phone]    Addendum 02/15/21: Patient returned patient assistance forms. Awaiting PCP signature then will fax for approval.

## 2021-02-03 ENCOUNTER — Other Ambulatory Visit: Payer: Self-pay | Admitting: Family Medicine

## 2021-02-03 NOTE — Telephone Encounter (Signed)
Requested Prescriptions  Pending Prescriptions Disp Refills  . hydrALAZINE (APRESOLINE) 100 MG tablet [Pharmacy Med Name: HYDRALAZINE HCL 100 MG TAB] 90 tablet 1    Sig: TAKE 1 TABLET BY MOUTH 3 TIMES DAILY     Cardiovascular:  Vasodilators Failed - 02/03/2021  5:50 AM      Failed - HCT in normal range and within 360 days    HCT  Date Value Ref Range Status  11/08/2020 32 (A) 41 - 53 Final   Hematocrit  Date Value Ref Range Status  11/09/2014 38.9 37.5 - 51.0 % Final         Failed - HGB in normal range and within 360 days    Hemoglobin  Date Value Ref Range Status  11/08/2020 10.7 (A) 13.5 - 17.5 Final  11/09/2014 12.4 (L) 12.6 - 17.7 g/dL Final         Failed - RBC in normal range and within 360 days    RBC  Date Value Ref Range Status  11/08/2020 3.57 (A) 3.87 - 5.11 Final         Failed - Last BP in normal range    BP Readings from Last 1 Encounters:  01/12/21 140/64         Passed - WBC in normal range and within 360 days    WBC  Date Value Ref Range Status  11/08/2020 6.1  Final  07/08/2013 8.0 3.8 - 10.6 x10 3/mm 3 Final         Passed - PLT in normal range and within 360 days    Platelets  Date Value Ref Range Status  11/08/2020 249 150 - 399 Final  11/09/2014 241 150 - 379 [id number]/uL Final         Passed - Valid encounter within last 12 months    Recent Outpatient Visits          2 months ago Diabetes mellitus with nephropathy [facility])   [facility] [name], MD   6 months ago Diabetes mellitus with nephropathy [facility])   [facility] [name], MD   10 months ago Diabetes mellitus with nephropathy [facility])   [facility] [name], MD   1 year ago Diabetes mellitus with nephropathy [facility])   [facility] [name], MD   1 year ago Diabetes mellitus with nephropathy [facility])   [place], [name], MD      Future Appointments            In 9 months  [place], Ronda Fairly, MD [facility]

## 2021-02-04 ENCOUNTER — Other Ambulatory Visit: Payer: Self-pay | Admitting: Family Medicine

## 2021-02-04 DIAGNOSIS — G629 Polyneuropathy, unspecified: Secondary | ICD-10-CM

## 2021-02-05 NOTE — Telephone Encounter (Signed)
Requested Prescriptions  Pending Prescriptions Disp Refills  . gabapentin (NEURONTIN) 600 MG tablet [Pharmacy Med Name: GABAPENTIN 600 MG TAB] 180 tablet 0    Sig: TAKE 1 TABLET BY MOUTH 2 TIMES DAILY     Neurology: Anticonvulsants - gabapentin Passed - 02/04/2021  4:47 PM      Passed - Valid encounter within last 12 months    Recent Outpatient Visits          2 months ago Diabetes mellitus with nephropathy [facility])   [facility] [name], MD   6 months ago Diabetes mellitus with nephropathy [facility])   [facility] [name], MD   10 months ago Diabetes mellitus with nephropathy [facility])   [facility] [name], MD   1 year ago Diabetes mellitus with nephropathy [facility])   [facility] [name], MD   1 year ago Diabetes mellitus with nephropathy [facility])   [place], [name], MD      Future Appointments            In 9 months [place], Ronda Fairly, MD [place]

## 2021-02-07 ENCOUNTER — Other Ambulatory Visit: Payer: Self-pay

## 2021-02-07 ENCOUNTER — Other Ambulatory Visit
Admission: RE | Admit: 2021-02-07 | Discharge: 2021-02-07 | Disposition: A | Discharge status: Home / Self Care | Payer: PPO | Source: Ambulatory Visit | Attending: Internal Medicine | Admitting: Internal Medicine

## 2021-02-07 DIAGNOSIS — Z01812 Encounter for preprocedural laboratory examination: Secondary | ICD-10-CM | POA: Insufficient documentation

## 2021-02-07 DIAGNOSIS — Z20822 Contact with and (suspected) exposure to covid-19: Secondary | ICD-10-CM | POA: Diagnosis not present

## 2021-02-07 LAB — SARS CORONAVIRUS 2 (TAT 6-24 HRS): SARS Coronavirus 2: NEGATIVE

## 2021-02-09 ENCOUNTER — Ambulatory Visit: Payer: PPO | Attending: Pulmonary Disease

## 2021-02-09 DIAGNOSIS — G4733 Obstructive sleep apnea (adult) (pediatric): Secondary | ICD-10-CM | POA: Diagnosis not present

## 2021-02-09 DIAGNOSIS — Z6837 Body mass index (BMI) 37.0-37.9, adult: Secondary | ICD-10-CM | POA: Diagnosis not present

## 2021-02-11 ENCOUNTER — Telehealth (HOSPITAL_BASED_OUTPATIENT_CLINIC_OR_DEPARTMENT_OTHER): Payer: PPO | Admitting: Pulmonary Disease

## 2021-02-11 ENCOUNTER — Other Ambulatory Visit: Payer: Self-pay

## 2021-02-11 DIAGNOSIS — G4733 Obstructive sleep apnea (adult) (pediatric): Secondary | ICD-10-CM

## 2021-02-11 NOTE — Telephone Encounter (Signed)
BiPAP 16/10 required  (compared to 25/21 on prior study ) Lower pressure due to weight loss

## 2021-02-14 DIAGNOSIS — N1832 Chronic kidney disease, stage 3b: Secondary | ICD-10-CM | POA: Diagnosis not present

## 2021-02-14 DIAGNOSIS — R809 Proteinuria, unspecified: Secondary | ICD-10-CM | POA: Diagnosis not present

## 2021-02-14 DIAGNOSIS — N2581 Secondary hyperparathyroidism of renal origin: Secondary | ICD-10-CM | POA: Diagnosis not present

## 2021-02-14 DIAGNOSIS — N041 Nephrotic syndrome with focal and segmental glomerular lesions: Secondary | ICD-10-CM | POA: Diagnosis not present

## 2021-02-14 DIAGNOSIS — I129 Hypertensive chronic kidney disease with stage 1 through stage 4 chronic kidney disease, or unspecified chronic kidney disease: Secondary | ICD-10-CM | POA: Diagnosis not present

## 2021-02-14 NOTE — Telephone Encounter (Signed)
Order has been placed to adapt to adjust pressure. Patient is aware and voiced his understanding.  Nothing further needed at this time.

## 2021-02-14 NOTE — Addendum Note (Signed)
Addended by: Claudette Head A on: 02/14/2021 03:45 PM   Modules accepted: Orders

## 2021-02-21 DIAGNOSIS — N2581 Secondary hyperparathyroidism of renal origin: Secondary | ICD-10-CM | POA: Diagnosis not present

## 2021-02-21 DIAGNOSIS — R809 Proteinuria, unspecified: Secondary | ICD-10-CM | POA: Diagnosis not present

## 2021-02-21 DIAGNOSIS — N041 Nephrotic syndrome with focal and segmental glomerular lesions: Secondary | ICD-10-CM | POA: Diagnosis not present

## 2021-02-21 DIAGNOSIS — I129 Hypertensive chronic kidney disease with stage 1 through stage 4 chronic kidney disease, or unspecified chronic kidney disease: Secondary | ICD-10-CM | POA: Diagnosis not present

## 2021-02-21 DIAGNOSIS — N1832 Chronic kidney disease, stage 3b: Secondary | ICD-10-CM | POA: Diagnosis not present

## 2021-02-22 ENCOUNTER — Ambulatory Visit: Payer: Self-pay

## 2021-02-22 DIAGNOSIS — Z794 Long term (current) use of insulin: Secondary | ICD-10-CM

## 2021-02-22 DIAGNOSIS — E1122 Type 2 diabetes mellitus with diabetic chronic kidney disease: Secondary | ICD-10-CM

## 2021-02-22 NOTE — Progress Notes (Signed)
[employer] Patient Assistance form for [employer], Humalog, and Trulicity completed by patient and faxed for review on 02/22/21  [name], PharmD, Para [place], [place] [phone]

## 2021-03-04 ENCOUNTER — Telehealth: Payer: Self-pay

## 2021-03-04 NOTE — Progress Notes (Signed)
Chronic Care Management Pharmacy Assistant   Name: Kamen Hanken  MRN: [id number] DOB: 10-22-1950  Reason for Encounter: Medication Review/Patient Assistance status for renewal for Basaglar, Humalog and Trulicity.    Hospital visits:  None in previous 6 months  Medications: Outpatient Encounter Medications as of 03/04/2021  Medication Sig Note   albuterol (PROVENTIL) (2.5 MG/3ML) 0.083% nebulizer solution Take 3 mLs (2.5 mg total) by nebulization every 4 (four) hours. And PRN (Patient taking differently: Take 2.5 mg by nebulization as needed.) 05/11/2020: PRN, hasnt had to use in 2-3 months   amLODipine (NORVASC) 10 MG tablet Take 1 tablet (10 mg total) by mouth daily.    aspirin 81 MG tablet Take 81 mg by mouth daily.    atorvastatin (LIPITOR) 80 MG tablet TAKE ONE TABLET AT BEDTIME    calcitRIOL (ROCALTROL) 0.25 MCG capsule Take 0.25 mcg by mouth daily.    carvedilol (COREG) 25 MG tablet TAKE 1 TABLET BY MOUTH TWICE DAILY    Cholecalciferol (VITAMIN D3 PO) Take 1,000 Units by mouth daily.     cloNIDine (CATAPRES) 0.2 MG tablet TAKE 1 TABLET BY MOUTH TWICE DAILY    clopidogrel (PLAVIX) 75 MG tablet Take 1 tablet (75 mg total) by mouth daily.    Cyanocobalamin (VITAMIN B 12 PO) Take 1,000 mcg by mouth daily.     doxazosin (CARDURA) 8 MG tablet TAKE ONE TABLET BY MOUTH EVERY DAY -STOP FLOMAX-    Dulaglutide (TRULICITY) 1.5 PJ/0.9TO SOPN Inject 1.5 mg into the skin once a week.    erythromycin ophthalmic ointment Apply 1 a small amount into right eye every night    finasteride (PROSCAR) 5 MG tablet Take 1 tablet (5 mg total) by mouth daily.    fluticasone (FLONASE) 50 MCG/ACT nasal spray USE TWO SPRAYS IN EACH NOSTRIL DAILY    furosemide (LASIX) 40 MG tablet Take 40 mg by mouth 2 (two) times daily.     gabapentin (NEURONTIN) 600 MG tablet TAKE 1 TABLET BY MOUTH 2 TIMES DAILY    HUMALOG KWIKPEN 100 UNIT/ML KwikPen Inject 6-10 Units into the skin 3 (three) times daily. Sliding scale     hydrALAZINE (APRESOLINE) 100 MG tablet TAKE 1 TABLET BY MOUTH 3 TIMES DAILY    Insulin Glargine (BASAGLAR KWIKPEN) 100 UNIT/ML Inject 30 Units into the skin daily. 05/11/2020: 18 units this AM     Insulin Pen Needle (BD PEN NEEDLE NANO U/F) 32G X 4 MM MISC USE DAILY AS DIRECTED    INVOKANA 100 MG TABS tablet Take 100 mg by mouth daily.    isosorbide mononitrate (IMDUR) 60 MG 24 hr tablet TAKE 1 TABLET BY MOUTH DAILY    levothyroxine (SYNTHROID) 125 MCG tablet TAKE 1 TABLET EVERY DAY ON EMPTY STOMACHWITH A GLASS OF WATER AT LEAST 30-60 MINBEFORE BREAKFAST    losartan (COZAAR) 100 MG tablet Take 1 tablet (100 mg total) by mouth daily.    Lysine 500 MG CAPS Take 1 capsule by mouth daily.    ONETOUCH ULTRA test strip USE AS DIRECTED THREE TIMES A DAY    oxyCODONE (OXY IR/ROXICODONE) 5 MG immediate release tablet Take 1 tablet (5 mg total) by mouth daily as needed for severe pain.    senna (SENOKOT) 8.6 MG tablet Take by mouth.    SENNA PO Take 1 capsule by mouth in the morning, at noon, in the evening, and at bedtime.    SYMBICORT 80-4.5 MCG/ACT inhaler INHALE 2 PUFFS INTO LUNGS TWICE DAILY  Vitamin D, Cholecalciferol, 10 MCG (400 UNIT) TABS Take by mouth.    No facility-administered encounter medications on file as of 03/04/2021.    I reach out to [employer] to get a update on patient assistance renewal status for Basaglar, Humalog and Trulicity for year [id number].  Per [employer], Patient was approved to continue to receive assistance through year 2023 for [place] and Trulicity.  Patient was inform and verbalized understanding and appreciation.  [place] Pharmacist Assistant [phone]

## 2021-03-11 ENCOUNTER — Other Ambulatory Visit: Payer: Self-pay | Admitting: Family Medicine

## 2021-03-11 DIAGNOSIS — I10 Essential (primary) hypertension: Secondary | ICD-10-CM

## 2021-03-16 DIAGNOSIS — J45909 Unspecified asthma, uncomplicated: Secondary | ICD-10-CM | POA: Diagnosis not present

## 2021-03-16 DIAGNOSIS — G4733 Obstructive sleep apnea (adult) (pediatric): Secondary | ICD-10-CM | POA: Diagnosis not present

## 2021-03-26 ENCOUNTER — Other Ambulatory Visit: Payer: Self-pay | Admitting: Family Medicine

## 2021-03-26 DIAGNOSIS — E785 Hyperlipidemia, unspecified: Secondary | ICD-10-CM

## 2021-03-27 NOTE — Telephone Encounter (Signed)
Requested Prescriptions  Pending Prescriptions Disp Refills  . atorvastatin (LIPITOR) 80 MG tablet [Pharmacy Med Name: ATORVASTATIN CALCIUM 80 MG TAB] 90 tablet 1    Sig: TAKE ONE TABLET AT BEDTIME     Cardiovascular:  Antilipid - Statins Failed - 03/26/2021  8:16 AM      Failed - HDL in normal range and within 360 days    HDL  Date Value Ref Range Status  11/12/2020 33 (L) >39 mg/dL Final         Failed - Triglycerides in normal range and within 360 days    Triglycerides  Date Value Ref Range Status  11/12/2020 209 (H) 0 - 149 mg/dL Final         Passed - Total Cholesterol in normal range and within 360 days    Cholesterol, Total  Date Value Ref Range Status  11/12/2020 119 100 - 199 mg/dL Final         Passed - LDL in normal range and within 360 days    LDL Cholesterol (Calc)  Date Value Ref Range Status  03/20/2017 83 mg/dL (calc) Final    Comment:    Reference range: <100 . Desirable range <100 mg/dL for primary prevention;   <70 mg/dL for patients with CHD or diabetic patients  with > or = 2 CHD risk factors. [name] Kitchen LDL-C is now calculated using the Martin-Hopkins  calculation, which is a validated novel method providing  better accuracy than the Friedewald equation in the  estimation of LDL-C.  Cresenciano Genre et al. Annamaria Helling. 5409;811(91): 2061-2068  (http://education.[employer].com/faq/FAQ164)    LDL Chol Calc (NIH)  Date Value Ref Range Status  11/12/2020 52 0 - 99 mg/dL Final         Passed - Patient is not pregnant      Passed - Valid encounter within last 12 months    Recent Outpatient Visits          4 months ago Diabetes mellitus with nephropathy [facility])   [facility] [name], MD   8 months ago Diabetes mellitus with nephropathy [facility]   [facility] [name], MD   1 year ago Diabetes mellitus with nephropathy [facility])   [facility] [name], MD   1 year ago Diabetes mellitus with  nephropathy [facility])   [facility] [name], MD   1 year ago Diabetes mellitus with nephropathy [facility])   [place], [name], MD      Future Appointments            In 7 months [place], Ronda Fairly, MD [place]

## 2021-03-28 ENCOUNTER — Telehealth: Payer: Self-pay

## 2021-03-28 NOTE — Progress Notes (Signed)
Chronic Care Management Pharmacy Note  03/29/2021 Name:  Jacob Herrera MRN:  [id number] DOB:  February 07, 1951  Summary: Patient presents for CCM follow-up. He is doing well and reports less dizziness overall. Patient was denied patient assistance for Invokana, but has been able to continue the medication despite being in the [facility]. Could consider switching to [place] for easier approval in the future.   Patient is not routinely monitoring blood pressures.   Recommendations/Changes made from today's visit: Continue current medications  -START Freestyle Libre 2 system  -Refill of Basaglar sent into [employer]: CPP follow-up 1 month after PCP visit.  Subjective: Jacob Herrera is an 70 y.o. year old male who is a primary patient of [name], [name], MD.  The CCM team was consulted for assistance with disease management and care coordination needs.    Engaged with patient face to face for follow up visit in response to provider referral for pharmacy case management and/or care coordination services.   Consent to Services:  The patient was given information about Chronic Care Management services, agreed to services, and gave verbal consent prior to initiation of services.  Please see initial visit note for detailed documentation.   Patient Care Team: [name], MD as PCP - General (Family Medicine) Manya Silvas, MD (Inactive) as Consulting Physician (Gastroenterology) [name], MD as Consulting Physician (Nephrology) Ubaldo Glassing Javier Docker, MD as Consulting Physician (Cardiology) [name], MD as Referring Physician (Dermatology) [name], MD (Urology) [name], [facility] (Pharmacist)  Recent office visits: 11/12/20: patient presented to Dr. Caryn Section for follow-up. A1c 6.1%.    Recent consult visits: 02/21/21: Patient presented to Dr. Juleen China (Nephrology) for follow-up. Invokana 100 mg stopped, farxiga 10 mg daily started. 01/12/21: Patient  presented to Dr. Mortimer Fries for OSA.  12/20/20: Patient presented to Dr. Ubaldo Glassing (Cardiology) for follow-up. Move losartan to PM.  11/15/20: Patient presented to Dr. Juleen China (Nephrology) for follow-up. Invokana 100 mg daily.  06/17/20: Patient presented to Dr. Ubaldo Glassing (Cardiology) for follow-up. No medication changes made.   Hospital visits: 06/02/20: Patient hospitalized for cataract extraction.  05/11/20: Patient hospitalized for cataract extraction.   Objective:  Lab Results  Component Value Date   CREATININE 2.1 (A) 11/08/2020   BUN 34 (A) 11/08/2020   GFRNONAA 33 11/08/2020   GFRAA 42 (L) 10/07/2018   NA 139 11/08/2020   K 4.5 11/08/2020   CALCIUM 10.3 11/08/2020   CO2 22 11/08/2020   GLUCOSE 134 (H) 10/07/2018    Lab Results  Component Value Date/Time   HGBA1C 6.2 (H) 11/12/2020 02:14 PM   HGBA1C 6.3 (A) 11/12/2020 01:50 PM   HGBA1C 6.1 (A) 07/13/2020 01:56 PM   HGBA1C 6.3 (H) 03/20/2017 03:03 PM   MICROALBUR neg 03/20/2017 02:25 PM    Last diabetic Eye exam:  Lab Results  Component Value Date/Time   HMDIABEYEEXA No Retinopathy 01/24/2021 12:00 AM    Last diabetic Foot exam: No results found for: HMDIABFOOTEX   Lab Results  Component Value Date   CHOL 119 11/12/2020   HDL 33 (L) 11/12/2020   LDLCALC 52 11/12/2020   TRIG 209 (H) 11/12/2020   CHOLHDL 3.6 11/12/2020    Hepatic Function Latest Ref Rng & Units 11/08/2020 10/07/2018 12/21/2016  Total Protein 6.0 - 8.5 g/dL - 6.6 7.0  Albumin 3.5 - 5.0 3.9 4.2 4.3  AST 0 - 40 IU/L - 23 19  ALT 0 - 44 IU/L - 17 21  Alk Phosphatase 39 -  117 IU/L - 47 68  Total Bilirubin 0.0 - 1.2 mg/dL - 0.2 0.2    Lab Results  Component Value Date/Time   TSH 3.380 11/12/2020 02:14 PM   TSH 2.720 05/26/2019 04:16 PM   FREET4 1.30 11/12/2020 02:14 PM    CBC Latest Ref Rng & Units 11/08/2020 11/09/2014 07/08/2013  WBC - 6.1 6.6 8.0  Hemoglobin 13.5 - 17.5 10.7(A) 12.4(L) 13.5  Hematocrit 41 - 53 32(A) 38.9 41.7  Platelets 150 - 399 249 241 220     Lab Results  Component Value Date/Time   VD25OH 22.8 (L) 05/26/2019 04:16 PM    Clinical ASCVD: No  The ASCVD Risk score (Arnett DK, et al., 2019) failed to calculate for the following reasons:   The valid total cholesterol range is 130 to 320 mg/dL    Depression screen [facility] 2/9 11/12/2020 03/15/2020 04/30/2018  Decreased Interest 0 0 3  Down, Depressed, Hopeless 0 0 0  PHQ - 2 Score 0 0 3  Altered sleeping 1 0 2  Tired, decreased energy 2 0 3  Change in appetite 0 0 0  Feeling bad or failure about yourself  0 0 0  Trouble concentrating 0 0 1  Moving slowly or fidgety/restless 0 0 0  Suicidal thoughts 0 0 0  PHQ-9 Score 3 0 9  Difficult doing work/chores Not difficult at all Not difficult at all Not difficult at all    Social History   Tobacco Use  Smoking Status Former   Packs/day: 1.00   Years: 25.00   Pack years: 25.00   Types: Cigarettes   Quit date: 05/29/1988   Years since quitting: 32.8  Smokeless Tobacco Current   Types: Chew  Tobacco Comments   chews 1 bag of loose leaf chew/week   BP Readings from Last 3 Encounters:  01/12/21 140/64  11/19/20 (!) 113/58  11/12/20 133/60   Pulse Readings from Last 3 Encounters:  01/12/21 68  11/19/20 69  11/12/20 61   Wt Readings from Last 3 Encounters:  01/12/21 247 lb 11.2 oz (112.4 kg)  11/19/20 260 lb (117.9 kg)  11/12/20 260 lb (117.9 kg)   BMI Readings from Last 3 Encounters:  01/12/21 37.66 kg/m  11/19/20 39.53 kg/m  11/12/20 39.53 kg/m    Assessment/Interventions: Review of patient past medical history, allergies, medications, health status, including review of consultants reports, laboratory and other test data, was performed as part of comprehensive evaluation and provision of chronic care management services.   SDOH:  (Social Determinants of Health) assessments and interventions performed: Yes SDOH Interventions    Flowsheet Row Most Recent Value  SDOH Interventions   Financial Strain  Interventions Other (Comment)  [PAP]        CCM Care Plan  Allergies  Allergen Reactions   Chlorthalidone     D/c by dr. [name] for worsening renal function 03/2019   Spironolactone     gynecomastia    Medications Reviewed Today     Reviewed by Lavona Mound, CMA (Certified Medical Assistant) on 01/12/21 at 1341  Med List Status: <None>   Medication Order Taking? Sig Documenting Provider Last Dose Status Informant  albuterol (PROVENTIL) (2.5 MG/3ML) 0.083% nebulizer solution [id number] Yes Take 3 mLs (2.5 mg total) by nebulization every 4 (four) hours. And PRN  Patient taking differently: Take 2.5 mg by nebulization as needed.   Erin Fulling, MD Taking Active            Med Note [place], [facility]  Tue May 11, 2020  8:29 AM) PRN, hasnt had to use in 2-3 months  amLODipine (NORVASC) 10 MG tablet [id number] Yes Take 1 tablet (10 mg total) by mouth daily. Teodoro Spray, MD Taking Active   aspirin 81 MG tablet [id number] Yes Take 81 mg by mouth daily. [provider] Taking Active   atorvastatin (LIPITOR) 80 MG tablet [id number] Yes TAKE ONE TABLET AT BEDTIME [name], MD Taking Active   calcitRIOL (ROCALTROL) 0.25 MCG capsule [id number] Yes Take 0.25 mcg by mouth daily. [provider] Taking Active   carvedilol (COREG) 25 MG tablet [id number] Yes TAKE 1 TABLET BY MOUTH TWICE DAILY [name], MD Taking Active   Cholecalciferol (VITAMIN D3 PO) [id number] Yes Take 1,000 Units by mouth daily.  [provider] Taking Active Self  cloNIDine (CATAPRES) 0.2 MG tablet [id number] Yes TAKE 1 TABLET BY MOUTH TWICE DAILY [name], [name], MD Taking Active   clopidogrel (PLAVIX) 75 MG tablet [id number] Yes Take 1 tablet (75 mg total) by mouth daily. Teodoro Spray, MD Taking Active   Cyanocobalamin (VITAMIN B 12 PO) [id number] Yes Take 1,000 mcg by mouth daily.  [provider] Taking Active Self  doxazosin (CARDURA) 8 MG tablet [id number] Yes  TAKE ONE TABLET BY MOUTH EVERY DAY -STOP FLOMAX- [name], MD Taking Active   Dulaglutide (TRULICITY) 1.5 KN/3.9JQ SOPN [id number] Yes Inject 1.5 mg into the skin once a week. [name], MD Taking Active   erythromycin ophthalmic ointment [id number] Yes Apply 1 a small amount into right eye every night [provider] Taking Active   finasteride (PROSCAR) 5 MG tablet [id number] Yes Take 1 tablet (5 mg total) by mouth daily. [name], MD Taking Active   fluticasone [facility]) 50 MCG/ACT nasal spray [id number] Yes USE TWO SPRAYS IN EACH NOSTRIL DAILY [name], MD Taking Active   furosemide (LASIX) 40 MG tablet [id number] Yes Take 40 mg by mouth 2 (two) times daily.  [provider] Taking Active   gabapentin (NEURONTIN) 600 MG tablet [id number] Yes TAKE 1 TABLET BY MOUTH 2 TIMES DAILY [name], MD Taking Active   HUMALOG KWIKPEN 100 UNIT/ML KwikPen [id number] Yes Inject 6-10 Units into the skin 3 (three) times daily. Sliding scale [provider] Taking Active   hydrALAZINE (APRESOLINE) 100 MG tablet [id number] Yes TAKE 1 TABLET BY MOUTH 3 TIMES DAILY [name], MD Taking Active   Insulin Glargine [facility] KWIKPEN) 100 UNIT/ML [id number] Yes Inject 30 Units into the skin daily. [provider] Taking Active Self           Med Note [facility], Willey Blade   Tue May 11, 2020  8:35 AM) 18 units this AM    Insulin Pen Needle (BD PEN NEEDLE NANO U/F) 32G X 4 MM MISC [id number] Yes USE DAILY AS DIRECTED [name], MD Taking Active   INVOKANA 100 MG TABS tablet [id number] Yes Take 100 mg by mouth daily. [provider] Taking Active   isosorbide mononitrate (IMDUR) 60 MG 24 hr tablet [id number] Yes TAKE 1 TABLET BY MOUTH DAILY [name], MD Taking Active   levothyroxine (SYNTHROID) 125 MCG tablet [id number] Yes TAKE 1 TABLET EVERY DAY ON EMPTY STOMACHWITH A GLASS OF WATER AT LEAST 30-60 MINBEFORE BREAKFAST [name], MD Taking Active   losartan (COZAAR) 100 MG tablet [id number] Yes Take 1 tablet (100 mg total) by mouth daily. Teodoro Spray, MD Taking Active  Lysine 500 MG CAPS [id number] Yes Take 1 capsule by mouth daily. [provider] Taking Active   [facility] ULTRA test strip [id number] Yes USE AS DIRECTED THREE TIMES A DAY [name], MD Taking Active   oxyCODONE (OXY IR/ROXICODONE) 5 MG immediate release tablet [id number] Yes Take 1 tablet (5 mg total) by mouth daily as needed for severe pain. [name], MD Taking Active   senna [facility]) 8.6 MG tablet [id number] Yes Take by mouth. [provider] Taking Active   SENNA PO [id number] Yes Take 1 capsule by mouth in the morning, at noon, in the evening, and at bedtime. [provider] Taking Active   SYMBICORT 80-4.5 MCG/ACT inhaler [id number] Yes INHALE 2 PUFFS INTO LUNGS TWICE DAILY Flora Lipps, MD Taking Active   Vitamin D, Cholecalciferol, 10 MCG (400 UNIT) TABS [id number] Yes Take by mouth. [provider] Taking Active   Med List Note Margarita Mail, RN 03/31/15 1507): UDS done 08-11-14 Meds to last until 10-09-14            Patient Active Problem List   Diagnosis Date Noted   Vitamin D deficiency 05/27/2019   Vitamin B12 deficiency 05/27/2019   Benign hypertensive kidney disease with chronic kidney disease 04/23/2019   Nephrotic syndrome, focal and segmental glomerular lesions 04/23/2019   Proteinuria 04/23/2019   Secondary hyperparathyroidism of renal origin ([place]) 04/23/2019   Trigger middle finger of right hand 01/20/2019   Carpal tunnel syndrome, right 01/20/2019   Focal segmental glomerulosclerosis 03/26/2018   Primary osteoarthritis of first carpometacarpal joint of right hand 07/30/2016   Coronary artery disease with unstable angina pectoris ([place]) 03/31/2015   Atherosclerotic heart disease of native coronary artery with unstable angina pectoris ([place]) 37/94/3276   Diastolic heart failure  ([place]) 02/15/2015   Regional wall motion abnormality of heart 02/15/2015   Abnormal findings on diagnostic imaging of heart and coronary circulation 02/15/2015   Restrictive lung disease 11/10/2014   History of adenomatous polyp of colon 11/09/2014   Hypothyroidism 11/09/2014   Diabetes mellitus with nephropathy ([place]) 11/09/2014   Hyperlipidemia 11/09/2014   Depression 11/09/2014   Insomnia 11/09/2014   Resistant hypertension 11/09/2014   GERD (gastroesophageal reflux disease) 11/09/2014   Chronic kidney disease (CKD), stage III (moderate) ([place]) 11/09/2014   Dyspnea on exertion 11/09/2014   History of basal cell cancer 09/15/2014   DDD (degenerative disc disease), lumbar 09/09/2014   Facet syndrome, lumbar 09/09/2014   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   Greater trochanteric bursitis of both hips 09/09/2014   Allergic rhinitis 08/12/2014   Benign prostatic hyperplasia with urinary obstruction 06/24/2013   Nocturia 05/30/2013   Frequency of micturition 05/30/2013   Urge incontinence 05/30/2013   Asthma, chronic 05/29/2013   Morbid obesity ([place]) 05/29/2013   OSA treated with BiPAP 05/29/2013   Scoliosis 11/28/2011   SCC (squamous cell carcinoma), face 08/01/2011    Immunization History  Administered Date(s) Administered   Fluad Quad(high Dose 65+) 01/21/2019   Influenza Split 02/26/2013, 01/12/2014   Influenza, High Dose Seasonal PF 02/09/2015, 03/07/2016, 03/20/2017, 02/13/2018, 01/30/2020   PFIZER(Purple Top)SARS-COV-2 Vaccination 06/17/2019, 07/08/2019, 01/30/2020   Pneumococcal Conjugate-13 01/16/2014   Pneumococcal Polysaccharide-23 07/07/2008, 03/07/2016   Td 01/05/2003   Tdap 07/07/2008   Zoster Recombinat (Shingrix) 09/28/2017, 12/21/2017    Conditions to be addressed/monitored:  Hypertension, Hyperlipidemia, Diabetes, Heart Failure, Asthma, Chronic Kidney Disease, Hypothyroidism, Depression, BPH and Insomnia  Care Plan : General Pharmacy (Adult)  Updates made  by [name], [place] since  03/29/2021 12:00 AM     Problem: Hypertension, Hyperlipidemia, Diabetes, Heart Failure, Asthma, Chronic Kidney Disease, Hypothyroidism, Depression, BPH and Insomnia   Priority: High     Long-Range Goal: Patient-Specific Goal   Start Date: 07/13/2020  Expected End Date: 01/04/2022  This Visit's Progress: On track  Recent Progress: On track  Priority: High  Note:   Current Barriers:  Unable to independently afford treatment regimen  Pharmacist Clinical Goal(s):  Patient will verbalize ability to afford treatment regimen through collaboration with PharmD and provider.   Interventions: 1:1 collaboration with Malva Limes, MD regarding development and update of comprehensive plan of care as evidenced by provider attestation and co-signature Inter-disciplinary care team collaboration (see longitudinal plan of care) Comprehensive medication review performed; medication list updated in electronic medical record  Hyperlipidemia: (LDL goal < 70) -Controlled -History of CAD s/p PCI  -Current treatment: Atorvastatin 80 mg daily  -Medications previously tried: NA  -Educated on Importance of limiting foods high in cholesterol; -Recommended to continue current medication  Diabetes (A1c goal <8%) -Controlled -Current medications: Trulicity 1.5 mg weekly  Humalog sliding scale (4-8 units typically) -adjusts based on carb intake  Basaglar 35 units daily  Invokana 100 mg daily  -Medications previously tried: Bydureon, Glipizide, Victoza, metformin, Ozempic, Actos, Januvia  -Current home glucose readings Fasting Blood Sugars: 96-140 typical range -Denies hypoglycemic/hyperglycemic symptoms -Current exercise: Sedentary. Patient is limited by sciatica, neuropathy pain.  -START Freestyle Libre 2 system. Will educated patient after next PCP follow-up.   Heart Failure (Goal: manage symptoms and prevent exacerbations) -Controlled -Last ejection fraction:  50% -HF type: Diastolic -NYHA Class: II (slight limitation of activity) -AHA HF Stage: C (Heart disease and symptoms present) -Current treatment: Amlodipine 10 mg daily  Carvedilol 25 mg twice daily  Clonidine 0.3 mg twice daily  Doxazosin 8 mg daily  Furosemide 40 mg twice daily  Hydralazine 100 mg three times daily  Imdur 60 mg daily  Losartan 100 mg nightly  -Medications previously tried: Chlorthalidone, spironolactone, nebivolol   -Current home BP/HR readings: NA, does not monitor at home. -Recommended to continue current medication  Hypothyroidism (Goal: Maintain stable thyroid function) -Controlled -Current treatment  Levothyroxine 125 mcg daily before breakfast  -Medications previously tried: NA  -Recommended to continue current medication  Chronic Kidney Disease Stage 3b  -All medications assessed for renal dosing and appropriateness in chronic kidney disease. -Recommended to continue current medication   Patient Goals/Self-Care Activities Patient will:  - check glucose 3-4 times daily: before meals and at bedtime, document, and provide at future appointments check blood pressure weekly, document, and provide at future appointments  Follow Up Plan: Face to Face appointment with care management team member scheduled for: 05/09/2021 at 3:00 PM     Medication Assistance:  Humalog, Basaglar, Trulicity obtained through [employer] medication assistance program.  Enrollment ends Dec 2023   Invokana patient assistance denied.   Patient's preferred pharmacy is:  [facility] - [place], [place] - [address] [place] [address] Phone: [phone] Fax: [phone]  Uses pill box? Yes Pt endorses 100% compliance  We discussed: Current pharmacy is preferred with insurance plan and patient is satisfied with pharmacy services Patient decided to: Continue current medication management strategy  Care Plan and Follow Up Patient Decision:   Patient agrees to Care Plan and Follow-up.  Plan: Face to Face appointment with care management team member scheduled for: 05/09/2021 at 3:00 PM  Angelena Sole, PharmD, BCACP, CPP  [place] [phone]

## 2021-03-28 NOTE — Progress Notes (Signed)
APPOINTMENT REMINDER   Called Jacob Herrera [facility], No answer, left message of appointment on 03/29/2021 at 1:30 pm via telephone visit with [name] , Pharm D. Notified to have all medications, supplements, blood pressure and/or blood sugar logs available during appointment and to return call if need to reschedule.   [facility] Pharmacist Assistant [phone]

## 2021-03-29 ENCOUNTER — Ambulatory Visit (INDEPENDENT_AMBULATORY_CARE_PROVIDER_SITE_OTHER): Payer: PPO

## 2021-03-29 DIAGNOSIS — E1121 Type 2 diabetes mellitus with diabetic nephropathy: Secondary | ICD-10-CM

## 2021-03-29 DIAGNOSIS — I5032 Chronic diastolic (congestive) heart failure: Secondary | ICD-10-CM

## 2021-03-29 MED ORDER — FREESTYLE LIBRE 2 SENSOR MISC: 3 refills | Status: DC

## 2021-03-29 MED ORDER — TRULICITY 1.5 MG/0.5ML ~~LOC~~ SOAJ: 1.5000 mg | SUBCUTANEOUS | Status: DC

## 2021-03-29 MED ORDER — FREESTYLE LIBRE 2 READER DEVI: 0 refills | Status: DC

## 2021-03-29 MED ORDER — BASAGLAR KWIKPEN 100 UNIT/ML ~~LOC~~ SOPN: 35.0000 [IU] | PEN_INJECTOR | Freq: Every day | SUBCUTANEOUS | Status: DC

## 2021-03-29 MED ORDER — INSULIN LISPRO (1 UNIT DIAL) 100 UNIT/ML (KWIKPEN): 6.0000 [IU] | PEN_INJECTOR | Freq: Three times a day (TID) | SUBCUTANEOUS | Status: DC

## 2021-03-29 NOTE — Patient Instructions (Signed)
Visit Information It was great speaking with you today!  Please let me know if you have any questions about our visit.   Goals Addressed             This Visit's Progress    Monitor and Manage My Blood Sugar-Diabetes Type 2   On track    Timeframe:  Long-Range Goal Priority:  High Start Date:  07/13/20                           Expected End Date: 01/13/22                      Follow Up within 90 days    -check blood sugar 2-4 times daily: Before meals and at bedtime - check blood sugar if I feel it is too high or too low - enter blood sugar readings and medication or insulin into daily log - take the blood sugar log to all doctor visits    Why is this important?   Checking your blood sugar at home helps to keep it from getting very high or very low.  Writing the results in a diary or log helps the doctor know how to care for you.  Your blood sugar log should have the time, date and the results.  Also, write down the amount of insulin or other medicine that you take.  Other information, like what you ate, exercise done and how you were feeling, will also be helpful.     Notes:         Patient Care Plan: General Pharmacy (Adult)     Problem Identified: Hypertension, Hyperlipidemia, Diabetes, Heart Failure, Asthma, Chronic Kidney Disease, Hypothyroidism, Depression, BPH and Insomnia   Priority: High     Long-Range Goal: Patient-Specific Goal   Start Date: 07/13/2020  Expected End Date: 01/04/2022  This Visit's Progress: On track  Recent Progress: On track  Priority: High  Note:   Current Barriers:  Unable to independently afford treatment regimen  Pharmacist Clinical Goal(s):  Patient will verbalize ability to afford treatment regimen through collaboration with PharmD and provider.   Interventions: 1:1 collaboration with [name], MD regarding development and update of comprehensive plan of care as evidenced by provider attestation and  co-signature Inter-disciplinary care team collaboration (see longitudinal plan of care) Comprehensive medication review performed; medication list updated in electronic medical record  Hyperlipidemia: (LDL goal < 70) -Controlled -History of CAD s/p PCI  -Current treatment: Atorvastatin 80 mg daily  -Medications previously tried: NA  -Educated on Importance of limiting foods high in cholesterol; -Recommended to continue current medication  Diabetes (A1c goal <8%) -Controlled -Current medications: Trulicity 1.5 mg weekly  Humalog sliding scale (4-8 units typically) -adjusts based on carb intake  Basaglar 35 units daily  Invokana 100 mg daily  -Medications previously tried: Bydureon, Glipizide, Victoza, metformin, Ozempic, Actos, Januvia  -Current home glucose readings Fasting Blood Sugars: 96-140 typical range -Denies hypoglycemic/hyperglycemic symptoms -Current exercise: Sedentary. Patient is limited by sciatica, neuropathy pain.  -START Freestyle Libre 2 system. Will educated patient after next PCP follow-up.   Heart Failure (Goal: manage symptoms and prevent exacerbations) -Controlled -Last ejection fraction: 50% -HF type: Diastolic -NYHA Class: II (slight limitation of activity) -AHA HF Stage: C (Heart disease and symptoms present) -Current treatment: Amlodipine 10 mg daily  Carvedilol 25 mg twice daily  Clonidine 0.3 mg twice daily  Doxazosin 8 mg daily  Furosemide 40  mg twice daily  Hydralazine 100 mg three times daily  Imdur 60 mg daily  Losartan 100 mg nightly  -Medications previously tried: Chlorthalidone, spironolactone, nebivolol   -Current home BP/HR readings: NA, does not monitor at home. -Recommended to continue current medication  Hypothyroidism (Goal: Maintain stable thyroid function) -Controlled -Current treatment  Levothyroxine 125 mcg daily before breakfast  -Medications previously tried: NA  -Recommended to continue current medication  Chronic  Kidney Disease Stage 3b  -All medications assessed for renal dosing and appropriateness in chronic kidney disease. -Recommended to continue current medication   Patient Goals/Self-Care Activities Patient will:  - check glucose 3-4 times daily: before meals and at bedtime, document, and provide at future appointments check blood pressure weekly, document, and provide at future appointments  Follow Up Plan: Face to Face appointment with care management team member scheduled for: 05/09/2021 at 3:00 PM    Patient agreed to services and verbal consent obtained.   Patient verbalizes understanding of instructions provided today and agrees to view in [place].   [name], PharmD, [place], CPP  Clinical Pharmacist Practitioner  [facility] [phone]

## 2021-04-01 ENCOUNTER — Encounter: Payer: Self-pay | Admitting: Gastroenterology

## 2021-04-03 ENCOUNTER — Encounter: Payer: Self-pay | Admitting: Gastroenterology

## 2021-04-03 NOTE — H&P (Signed)
Pre-Procedure H&P   Patient ID: Jacob Herrera is a 69 y.o. male.  Gastroenterology Provider: Annamaria Helling, DO  Referring Provider: Laurine Blazer, PA PCP: [name], MD  Date: 04/04/2021  HPI Jacob Herrera is a 69 y.o. male who presents today for Esophagogastroduodenoscopy and Colonoscopy for FOBT+, personal history of colon polyps, anemia.  Per office notes, FOBT+ 2020. Had Colonoscopy in 2006 with 53mm SSP removed with 3 year recommended follow-up, did not undergo. Reportedly declined colonoscopy in 2019. IH and diverticulosis also seen in 2006.  Pt deals with chronic constipation- takes senna 3-4 x per day. Hgb 9.9 in October- mcv 92. Plt 245. Ferritin 158 OSA on cpap.  Nsaid use and asa 81  30 lb intentional weight loss Previous h/o tobacco use. On plavix- held 5 days prior to procedure with authorization from Dr.Fath.  No fhx crc or polyps  Past Medical History:  Diagnosis Date   Asthma    CHF (congestive heart failure) (HCC)    Chronic lower back pain    Complication of anesthesia    unknow "difficulty" after cardiac cath   Diabetes mellitus, type 2 ([place])    History of chicken pox    Hx of skin cancer, basal cell    Hypertension    Hypothyroidism    Kidney disease    stage 3   Neuropathy    feet   Scoliosis    Sleep apnea    BiPAP   Thyrotoxicosis    2010    Past Surgical History:  Procedure Laterality Date   CARDIAC CATHETERIZATION N/A 03/31/2015   Procedure: Left Heart Cath and Coronary Angiography;  Surgeon: Teodoro Spray, MD;  Location: [facility];  Service: Cardiovascular;  Laterality: N/A;   CARDIAC CATHETERIZATION N/A 03/31/2015   Procedure: Coronary Stent Intervention;  Surgeon: Teodoro Spray, MD;  Location: [place];  Service: Cardiovascular;  Laterality: N/A;   CARDIAC CATHETERIZATION N/A 03/31/2015   Procedure: Coronary Stent Intervention;  Surgeon: Yolonda Kida, MD;  Location: [place];  Service: Cardiovascular;  Laterality: N/A;   CATARACT EXTRACTION W/PHACO Right 05/11/2020   Procedure: CATARACT EXTRACTION PHACO AND INTRAOCULAR LENS PLACEMENT ([facility]) RIGHT DIABETIC 6.52 01:11.6 9.1%;  Surgeon: Leandrew Koyanagi, MD;  Location: [place];  Service: Ophthalmology;  Laterality: Right;  Diabetic - insulin sleep apnea   CATARACT EXTRACTION W/PHACO Left 06/02/2020   Procedure: CATARACT EXTRACTION PHACO AND INTRAOCULAR LENS PLACEMENT ([place]) LEFT DIABETIC;  Surgeon: Leandrew Koyanagi, MD;  Location: [place];  Service: Ophthalmology;  Laterality: Left;  3.14 0:45.0 7.0%   MOHS SURGERY Left 05/12/2019   Left nares by [name], MD [place]   NASAL SINUS SURGERY  2005   TONSILLECTOMY AND ADENOIDECTOMY  1950    Family History No h/o GI disease or malignancy  Review of Systems  Constitutional:  Negative for activity change, appetite change, chills, diaphoresis, fatigue, fever and unexpected weight change.  HENT:  Negative for trouble swallowing and voice change.   Respiratory:  Positive for shortness of breath (no change from baseline). Negative for wheezing.   Cardiovascular:  Negative for chest pain, palpitations and leg swelling.  Gastrointestinal:  Positive for constipation. Negative for abdominal distention, abdominal pain, anal bleeding, blood in stool, diarrhea, nausea and vomiting.  Musculoskeletal:  Negative for arthralgias and myalgias.  Skin:  Negative for color change and pallor.  Neurological:  Negative for dizziness, syncope and weakness.  Psychiatric/Behavioral:  Negative for confusion. The patient is not nervous/anxious.   All other systems reviewed and are negative.   Medications No current facility-administered medications on file prior to encounter.   Current Outpatient Medications on File Prior to Encounter  Medication Sig Dispense Refill   amLODipine (NORVASC) 10 MG tablet Take 1 tablet (10 mg total) by  mouth daily. 30 tablet 1   finasteride (PROSCAR) 5 MG tablet Take 1 tablet (5 mg total) by mouth daily. 90 tablet 2   isosorbide mononitrate (IMDUR) 60 MG 24 hr tablet TAKE 1 TABLET BY MOUTH DAILY 90 tablet 4   levothyroxine (SYNTHROID) 125 MCG tablet TAKE 1 TABLET EVERY DAY ON EMPTY STOMACHWITH A GLASS OF WATER AT LEAST 30-60 MINBEFORE BREAKFAST 90 tablet 1   albuterol (PROVENTIL) (2.5 MG/3ML) 0.083% nebulizer solution Take 3 mLs (2.5 mg total) by nebulization every 4 (four) hours. And PRN (Patient taking differently: Take 2.5 mg by nebulization as needed.) 75 mL 12   aspirin 81 MG tablet Take 81 mg by mouth daily.     calcitRIOL (ROCALTROL) 0.25 MCG capsule Take 0.25 mcg by mouth daily.     Cholecalciferol (VITAMIN D3 PO) Take 1,000 Units by mouth daily.      cloNIDine (CATAPRES) 0.2 MG tablet TAKE 1 TABLET BY MOUTH TWICE DAILY 180 tablet 3   clopidogrel (PLAVIX) 75 MG tablet Take 1 tablet (75 mg total) by mouth daily. 30 tablet 11   Cyanocobalamin (VITAMIN B 12 PO) Take 1,000 mcg by mouth daily.      doxazosin (CARDURA) 8 MG tablet TAKE ONE TABLET BY MOUTH EVERY DAY -STOP FLOMAX- 90 tablet 1   erythromycin ophthalmic ointment Apply 1 a small amount into right eye every night     fluticasone (FLONASE) 50 MCG/ACT nasal spray USE TWO SPRAYS IN EACH NOSTRIL DAILY 16 g 2   furosemide (LASIX) 40 MG tablet Take 40 mg by mouth 2 (two) times daily.      Insulin Pen Needle (BD PEN NEEDLE NANO U/F) 32G X 4 MM MISC USE DAILY AS DIRECTED 100 each 3   INVOKANA 100 MG TABS tablet Take 100 mg by mouth daily. (Patient not taking: Reported on 04/01/2021)     losartan (COZAAR) 100 MG tablet Take 1 tablet (100 mg total) by mouth daily. 30 tablet 1   Lysine 500 MG CAPS Take 1 capsule by mouth daily.     ONETOUCH ULTRA test strip USE AS DIRECTED THREE TIMES A DAY 100 each 3   oxyCODONE (OXY IR/ROXICODONE) 5 MG immediate release tablet Take 1 tablet (5 mg total) by mouth daily as needed for severe pain. (Patient not  taking: Reported on 04/01/2021) 30 tablet 0   senna (SENOKOT) 8.6 MG tablet Take by mouth. (Patient not taking: Reported on 04/01/2021)     SENNA PO Take 1 capsule by mouth in the morning, at noon, in the evening, and at bedtime. (Patient not taking: Reported on 04/01/2021)     SYMBICORT 80-4.5 MCG/ACT inhaler INHALE 2 PUFFS INTO LUNGS TWICE DAILY (Patient not taking: Reported on 04/01/2021) 10.2 g 0   Vitamin D, Cholecalciferol, 10 MCG (400 UNIT) TABS Take by mouth. (Patient not taking: Reported on 04/01/2021)      Pertinent medications related to GI and procedure were reviewed by me with the patient prior to the procedure   Current Facility-Administered Medications:    0.9 %  sodium chloride infusion, , Intravenous, Continuous, Annamaria Helling, DO   dextrose 50 % solution 12.5 g, 12.5  g, Intravenous, Once, Molli Barrows, MD      Allergies  Allergen Reactions   Chlorthalidone Other (See Comments)    D/c by dr. Juleen China for worsening renal function 03/2019   Spironolactone Other (See Comments)    gynecomastia   Allergies were reviewed by me prior to the procedure  Objective    Vitals:   04/04/21 0714  BP: 114/62  Pulse: 74  Resp: 18  Temp: (!) 96.6 F (35.9 C)  TempSrc: Temporal  SpO2: 95%  Weight: 107.5 kg  Height: 5\' 8"  (1.727 m)     Physical Exam Vitals and nursing note reviewed.  Constitutional:      General: He is not in acute distress.    Appearance: Normal appearance. He is obese. He is not ill-appearing, toxic-appearing or diaphoretic.  HENT:     Head: Normocephalic and atraumatic.     Nose: Nose normal.     Mouth/Throat:     Mouth: Mucous membranes are moist.     Pharynx: Oropharynx is clear.  Eyes:     General: No scleral icterus.    Extraocular Movements: Extraocular movements intact.  Cardiovascular:     Rate and Rhythm: Normal rate and regular rhythm.     Heart sounds: Normal heart sounds. No murmur heard.   No friction rub. No gallop.   Pulmonary:     Effort: Pulmonary effort is normal. No respiratory distress.     Breath sounds: Normal breath sounds. No wheezing, rhonchi or rales.  Abdominal:     General: Bowel sounds are normal. There is no distension.     Palpations: Abdomen is soft.     Tenderness: There is no abdominal tenderness. There is no guarding or rebound.  Musculoskeletal:     Cervical back: Neck supple.     Right lower leg: No edema.     Left lower leg: No edema.  Skin:    General: Skin is warm and dry.     Coloration: Skin is not jaundiced or pale.  Neurological:     General: No focal deficit present.     Mental Status: He is alert and oriented to person, place, and time. Mental status is at baseline.  Psychiatric:        Mood and Affect: Mood normal.        Behavior: Behavior normal.        Thought Content: Thought content normal.        Judgment: Judgment normal.     Assessment:  Jacob Herrera is a 70 y.o. male  who presents today for Esophagogastroduodenoscopy and Colonoscopy for FOBT+, personal history of colon polyps, anemia.  Plan:  Esophagogastroduodenoscopy and Colonoscopy with possible intervention today  Esophagogastroduodenoscopy and Colonoscopy with possible biopsy, control of bleeding, polypectomy, and interventions as necessary has been discussed with the patient/patient representative. Informed consent was obtained from the patient/patient representative after explaining the indication, nature, and risks of the procedure including but not limited to death, bleeding, perforation, missed neoplasm/lesions, cardiorespiratory compromise, and reaction to medications. Opportunity for questions was given and appropriate answers were provided. Patient/patient representative has verbalized understanding is amenable to undergoing the procedure.   Annamaria Helling, DO  [facility]  Portions of the record may have been created with voice recognition software.  Occasional wrong-word or 'sound-a-like' substitutions may have occurred due to the inherent limitations of voice recognition software.  Read the chart carefully and recognize, using context, where substitutions may have occurred.

## 2021-04-04 ENCOUNTER — Encounter
Admission: RE | Disposition: A | Discharge status: Home / Self Care | Payer: Self-pay | Source: Home / Self Care | Attending: Gastroenterology

## 2021-04-04 ENCOUNTER — Ambulatory Visit
Admission: RE | Admit: 2021-04-04 | Discharge: 2021-04-04 | Disposition: A | Discharge status: Home / Self Care | Payer: PPO | Attending: Gastroenterology | Admitting: Gastroenterology

## 2021-04-04 ENCOUNTER — Ambulatory Visit: Payer: PPO | Admitting: Registered Nurse

## 2021-04-04 ENCOUNTER — Encounter: Payer: Self-pay | Admitting: Gastroenterology

## 2021-04-04 DIAGNOSIS — D125 Benign neoplasm of sigmoid colon: Secondary | ICD-10-CM | POA: Diagnosis not present

## 2021-04-04 DIAGNOSIS — G4733 Obstructive sleep apnea (adult) (pediatric): Secondary | ICD-10-CM | POA: Diagnosis not present

## 2021-04-04 DIAGNOSIS — D123 Benign neoplasm of transverse colon: Secondary | ICD-10-CM | POA: Diagnosis not present

## 2021-04-04 DIAGNOSIS — D124 Benign neoplasm of descending colon: Secondary | ICD-10-CM | POA: Insufficient documentation

## 2021-04-04 DIAGNOSIS — K2100 Gastro-esophageal reflux disease with esophagitis, without bleeding: Secondary | ICD-10-CM | POA: Diagnosis not present

## 2021-04-04 DIAGNOSIS — Z86010 Personal history of colonic polyps: Secondary | ICD-10-CM | POA: Diagnosis not present

## 2021-04-04 DIAGNOSIS — J45909 Unspecified asthma, uncomplicated: Secondary | ICD-10-CM | POA: Insufficient documentation

## 2021-04-04 DIAGNOSIS — Z955 Presence of coronary angioplasty implant and graft: Secondary | ICD-10-CM | POA: Insufficient documentation

## 2021-04-04 DIAGNOSIS — E1140 Type 2 diabetes mellitus with diabetic neuropathy, unspecified: Secondary | ICD-10-CM | POA: Diagnosis not present

## 2021-04-04 DIAGNOSIS — K219 Gastro-esophageal reflux disease without esophagitis: Secondary | ICD-10-CM | POA: Insufficient documentation

## 2021-04-04 DIAGNOSIS — K635 Polyp of colon: Secondary | ICD-10-CM | POA: Insufficient documentation

## 2021-04-04 DIAGNOSIS — K209 Esophagitis, unspecified without bleeding: Secondary | ICD-10-CM | POA: Diagnosis not present

## 2021-04-04 DIAGNOSIS — I509 Heart failure, unspecified: Secondary | ICD-10-CM | POA: Insufficient documentation

## 2021-04-04 DIAGNOSIS — K319 Disease of stomach and duodenum, unspecified: Secondary | ICD-10-CM | POA: Diagnosis not present

## 2021-04-04 DIAGNOSIS — D122 Benign neoplasm of ascending colon: Secondary | ICD-10-CM | POA: Diagnosis not present

## 2021-04-04 DIAGNOSIS — I110 Hypertensive heart disease with heart failure: Secondary | ICD-10-CM | POA: Insufficient documentation

## 2021-04-04 DIAGNOSIS — K3189 Other diseases of stomach and duodenum: Secondary | ICD-10-CM | POA: Diagnosis not present

## 2021-04-04 DIAGNOSIS — R195 Other fecal abnormalities: Secondary | ICD-10-CM | POA: Diagnosis not present

## 2021-04-04 DIAGNOSIS — Z87891 Personal history of nicotine dependence: Secondary | ICD-10-CM | POA: Diagnosis not present

## 2021-04-04 DIAGNOSIS — K648 Other hemorrhoids: Secondary | ICD-10-CM | POA: Diagnosis not present

## 2021-04-04 DIAGNOSIS — Z1211 Encounter for screening for malignant neoplasm of colon: Secondary | ICD-10-CM | POA: Diagnosis not present

## 2021-04-04 DIAGNOSIS — K649 Unspecified hemorrhoids: Secondary | ICD-10-CM | POA: Diagnosis not present

## 2021-04-04 DIAGNOSIS — Z7902 Long term (current) use of antithrombotics/antiplatelets: Secondary | ICD-10-CM | POA: Diagnosis not present

## 2021-04-04 DIAGNOSIS — D649 Anemia, unspecified: Secondary | ICD-10-CM | POA: Diagnosis not present

## 2021-04-04 DIAGNOSIS — Z794 Long term (current) use of insulin: Secondary | ICD-10-CM | POA: Diagnosis not present

## 2021-04-04 DIAGNOSIS — K5909 Other constipation: Secondary | ICD-10-CM | POA: Insufficient documentation

## 2021-04-04 DIAGNOSIS — I25119 Atherosclerotic heart disease of native coronary artery with unspecified angina pectoris: Secondary | ICD-10-CM | POA: Diagnosis not present

## 2021-04-04 DIAGNOSIS — K5730 Diverticulosis of large intestine without perforation or abscess without bleeding: Secondary | ICD-10-CM | POA: Diagnosis not present

## 2021-04-04 DIAGNOSIS — G4730 Sleep apnea, unspecified: Secondary | ICD-10-CM | POA: Insufficient documentation

## 2021-04-04 DIAGNOSIS — K2970 Gastritis, unspecified, without bleeding: Secondary | ICD-10-CM | POA: Diagnosis not present

## 2021-04-04 DIAGNOSIS — E039 Hypothyroidism, unspecified: Secondary | ICD-10-CM | POA: Diagnosis not present

## 2021-04-04 DIAGNOSIS — D128 Benign neoplasm of rectum: Secondary | ICD-10-CM | POA: Insufficient documentation

## 2021-04-04 DIAGNOSIS — Z85828 Personal history of other malignant neoplasm of skin: Secondary | ICD-10-CM | POA: Diagnosis not present

## 2021-04-04 HISTORY — PX: ESOPHAGOGASTRODUODENOSCOPY (EGD) WITH PROPOFOL: SHX5813

## 2021-04-04 HISTORY — PX: COLONOSCOPY WITH PROPOFOL: SHX5780

## 2021-04-04 LAB — GLUCOSE, CAPILLARY
Glucose-Capillary: 75 mg/dL (ref 70–99)
Glucose-Capillary: 86 mg/dL (ref 70–99)

## 2021-04-04 SURGERY — ESOPHAGOGASTRODUODENOSCOPY (EGD) WITH PROPOFOL
Anesthesia: General

## 2021-04-04 MED ORDER — EPHEDRINE 5 MG/ML INJ
INTRAVENOUS | Status: AC
  Filled 2021-04-04: qty 5

## 2021-04-04 MED ORDER — PHENYLEPHRINE HCL (PRESSORS) 10 MG/ML IV SOLN
INTRAVENOUS | Status: DC | PRN
  Administered 2021-04-04: 160 ug via INTRAVENOUS
  Administered 2021-04-04: 240 ug via INTRAVENOUS
  Administered 2021-04-04: 80 ug via INTRAVENOUS
  Administered 2021-04-04: 160 ug via INTRAVENOUS
  Administered 2021-04-04: 80 ug via INTRAVENOUS

## 2021-04-04 MED ORDER — PROPOFOL 10 MG/ML IV BOLUS
INTRAVENOUS | Status: DC | PRN
  Administered 2021-04-04: 80 mg via INTRAVENOUS

## 2021-04-04 MED ORDER — DEXMEDETOMIDINE HCL 200 MCG/2ML IV SOLN
INTRAVENOUS | Status: DC | PRN
  Administered 2021-04-04: 20 ug via INTRAVENOUS

## 2021-04-04 MED ORDER — PHENYLEPHRINE HCL-NACL 20-0.9 MG/250ML-% IV SOLN
INTRAVENOUS | Status: AC
  Filled 2021-04-04: qty 250

## 2021-04-04 MED ORDER — PHENYLEPHRINE HCL (PRESSORS) 10 MG/ML IV SOLN
INTRAVENOUS | Status: AC
  Filled 2021-04-04: qty 1

## 2021-04-04 MED ORDER — LIDOCAINE HCL (PF) 2 % IJ SOLN
INTRAMUSCULAR | Status: AC
  Filled 2021-04-04: qty 20

## 2021-04-04 MED ORDER — DEXMEDETOMIDINE HCL IN NACL 200 MCG/50ML IV SOLN
INTRAVENOUS | Status: AC
  Filled 2021-04-04: qty 50

## 2021-04-04 MED ORDER — DEXTROSE 50 % IV SOLN
INTRAVENOUS | Status: AC
  Filled 2021-04-04: qty 50

## 2021-04-04 MED ORDER — PROPOFOL 10 MG/ML IV BOLUS
INTRAVENOUS | Status: AC
  Filled 2021-04-04: qty 20

## 2021-04-04 MED ORDER — PROPOFOL 500 MG/50ML IV EMUL
INTRAVENOUS | Status: AC
  Filled 2021-04-04: qty 200

## 2021-04-04 MED ORDER — GLYCOPYRROLATE 0.2 MG/ML IJ SOLN
INTRAMUSCULAR | Status: DC | PRN
  Administered 2021-04-04: .2 mg via INTRAVENOUS

## 2021-04-04 MED ORDER — SODIUM CHLORIDE 0.9 % IV SOLN: INTRAVENOUS | Status: DC

## 2021-04-04 MED ORDER — EPHEDRINE SULFATE 50 MG/ML IJ SOLN
INTRAMUSCULAR | Status: DC | PRN
  Administered 2021-04-04: 5 mg via INTRAVENOUS
  Administered 2021-04-04: 10 mg via INTRAVENOUS
  Administered 2021-04-04: 5 mg via INTRAVENOUS
  Administered 2021-04-04: 10 mg via INTRAVENOUS
  Administered 2021-04-04: 15 mg via INTRAVENOUS

## 2021-04-04 MED ORDER — DEXTROSE 50 % IV SOLN
12.5000 g | Freq: Once | INTRAVENOUS | Status: AC
  Administered 2021-04-04: 12.5 g via INTRAVENOUS

## 2021-04-04 MED ORDER — LIDOCAINE HCL (CARDIAC) PF 100 MG/5ML IV SOSY
PREFILLED_SYRINGE | INTRAVENOUS | Status: DC | PRN
  Administered 2021-04-04: 100 mg via INTRAVENOUS

## 2021-04-04 MED ORDER — PROPOFOL 500 MG/50ML IV EMUL
INTRAVENOUS | Status: DC | PRN
  Administered 2021-04-04: 150 ug/kg/min via INTRAVENOUS

## 2021-04-04 MED ORDER — GLYCOPYRROLATE 0.2 MG/ML IJ SOLN
INTRAMUSCULAR | Status: AC
  Filled 2021-04-04: qty 1

## 2021-04-04 NOTE — Interval H&P Note (Signed)
History and Physical Interval Note: Preprocedure H&P from 04/04/21  was reviewed and there was no interval change after seeing and examining the patient.  Written consent was obtained from the patient after discussion of risks, benefits, and alternatives. Patient has consented to proceed with Esophagogastroduodenoscopy and Colonoscopy with possible intervention   04/04/2021 7:37 AM  Jacob Herrera  has presented today for surgery, with the diagnosis of Occult + blood stool (R19.5) Anemia (D64.9) PH COlon polys (Z86.010).  The various methods of treatment have been discussed with the patient and family. After consideration of risks, benefits and other options for treatment, the patient has consented to  Procedure(s) with comments: ESOPHAGOGASTRODUODENOSCOPY (EGD) WITH PROPOFOL (N/A) - IDDM COLONOSCOPY WITH PROPOFOL (N/A) as a surgical intervention.  The patient's history has been reviewed, patient examined, no change in status, stable for surgery.  I have reviewed the patient's chart and labs.  Questions were answered to the patient's satisfaction.     Annamaria Helling

## 2021-04-04 NOTE — Op Note (Signed)
[facility] Patient Name: Jacob Herrera Procedure Date: 04/04/2021 7:42 AM MRN: [id number] Account #: [id number] Date of Birth: [birth year] Admit Type: Outpatient Age: 70 Room: [facility] ENDO ROOM 2 Gender: Male Note Status: Finalized Instrument Name: Upper Endoscope [id number] Procedure:             Upper GI endoscopy Indications:           Anemia Providers:             Annamaria Helling DO, DO Medicines:             Monitored Anesthesia Care Complications:         No immediate complications. Estimated blood loss:                         Minimal. Procedure:             Pre-Anesthesia Assessment:                        - Prior to the procedure, a History and Physical was                         performed, and patient medications and allergies were                         reviewed. The patient is competent. The risks and                         benefits of the procedure and the sedation options and                         risks were discussed with the patient. All questions                         were answered and informed consent was obtained.                         Patient identification and proposed procedure were                         verified by the physician, the nurse, the anesthetist                         and the technician in the endoscopy suite. Mental                         Status Examination: alert and oriented. Airway                         Examination: normal oropharyngeal airway and neck                         mobility. Respiratory Examination: clear to                         auscultation. CV Examination: normal. Prophylactic                         Antibiotics: The patient does not require prophylactic  antibiotics. Prior Anticoagulants: The patient has                         taken Plavix (clopidogrel), last dose was 5 days prior                         to procedure. ASA Grade Assessment: III - A patient                          with severe systemic disease. After reviewing the                         risks and benefits, the patient was deemed in                         satisfactory condition to undergo the procedure. The                         anesthesia plan was to use monitored anesthesia care                         (MAC). Immediately prior to administration of                         medications, the patient was re-assessed for adequacy                         to receive sedatives. The heart rate, respiratory                         rate, oxygen saturations, blood pressure, adequacy of                         pulmonary ventilation, and response to care were                         monitored throughout the procedure. The physical                         status of the patient was re-assessed after the                         procedure.                        After obtaining informed consent, the endoscope was                         passed under direct vision. Throughout the procedure,                         the patient's blood pressure, pulse, and oxygen                         saturations were monitored continuously. The                         Endosonoscope was introduced through the mouth, and  advanced to the second part of duodenum. The upper GI                         endoscopy was accomplished without difficulty. The                         patient tolerated the procedure well. Findings:      The duodenal bulb, first portion of the duodenum and second portion of       the duodenum were normal. Biopsies for histology were taken with a cold       forceps for evaluation of celiac disease. Estimated blood loss was       minimal. Staining of the duodenum relating to iron or excessive laxative       use      A few localized less than 1 mm erosions with no bleeding and no stigmata       of recent bleeding were found in the gastric antrum. Biopsies were taken        with a cold forceps for Helicobacter pylori testing. Estimated blood       loss was minimal.      The exam of the stomach was otherwise normal.      LA Grade A (one or more mucosal breaks less than 5 mm, not extending       between tops of 2 mucosal folds) esophagitis with no bleeding was found       35 cm from the incisors. Biopsies were taken with a cold forceps for       histology. Estimated blood loss was minimal.      The exam of the esophagus was otherwise normal. Impression:            - Normal duodenal bulb, first portion of the duodenum                         and second portion of the duodenum. Biopsied.                        - Erosive gastropathy with no bleeding and no stigmata                         of recent bleeding. Biopsied.                        - LA Grade A reflux esophagitis with no bleeding. Rule                         out Barrett's esophagus. Biopsied. Recommendation:        - Discharge patient to home.                        - Resume previous diet.                        - No aspirin, ibuprofen, naproxen, or other                         non-steroidal anti-inflammatory drugs for 5 days after  polyp removal.                        - Continue present medications.                        - Use Protonix (pantoprazole) 40 mg PO daily to be                         prescribed.                        - Await pathology results.                        - Return to GI office as previously scheduled. Procedure Code(s):     --- Professional ---                        [id number], Esophagogastroduodenoscopy, flexible,                         transoral; with biopsy, single or multiple Diagnosis Code(s):     --- Professional ---                        K31.89, Other diseases of stomach and duodenum                        K21.00, Gastro-esophageal reflux disease with                         esophagitis, without bleeding                        D64.9, Anemia,  unspecified CPT copyright 2019 [employer]. All rights reserved. The codes documented in this report are preliminary and upon coder review may  be revised to meet current compliance requirements. Attending Participation:      I personally performed the entire procedure. Volney American, DO Annamaria Helling DO, DO 04/04/2021 8:57:27 AM This report has been signed electronically. Number of Addenda: 0 Note Initiated On: 04/04/2021 7:42 AM Estimated Blood Loss:  Estimated blood loss was minimal.      [facility]

## 2021-04-04 NOTE — Op Note (Addendum)
[facility] Gastroenterology Patient Name: Jacob Herrera Procedure Date: 04/04/2021 7:42 AM MRN: [id number] Account #: [id number] Date of Birth: 08/29/1950 Admit Type: Outpatient Age: 70 Room: [facility] ENDO ROOM 2 Gender: Male Note Status: Supervisor Override Instrument Name: Jasper Riling [id number] Procedure:             Colonoscopy Indications:           Personal history of colonic polyps, Positive fecal                         immunochemical test, Anemia Providers:             Annamaria Helling DO, DO Medicines:             Monitored Anesthesia Care Complications:         No immediate complications. Estimated blood loss:                         Minimal. Procedure:             Pre-Anesthesia Assessment:                        - Prior to the procedure, a History and Physical was                         performed, and patient medications and allergies were                         reviewed. The patient is competent. The risks and                         benefits of the procedure and the sedation options and                         risks were discussed with the patient. All questions                         were answered and informed consent was obtained.                         Patient identification and proposed procedure were                         verified by the physician, the nurse, the anesthetist                         and the technician in the endoscopy suite. Mental                         Status Examination: alert and oriented. Airway                         Examination: normal oropharyngeal airway and neck                         mobility. Respiratory Examination: clear to                         auscultation. CV Examination: normal. Prophylactic  Antibiotics: The patient does not require prophylactic                         antibiotics. Prior Anticoagulants: The patient has                         taken Plavix (clopidogrel), last  dose was 5 days prior                         to procedure. ASA Grade Assessment: III - A patient                         with severe systemic disease. After reviewing the                         risks and benefits, the patient was deemed in                         satisfactory condition to undergo the procedure. The                         anesthesia plan was to use monitored anesthesia care                         (MAC). Immediately prior to administration of                         medications, the patient was re-assessed for adequacy                         to receive sedatives. The heart rate, respiratory                         rate, oxygen saturations, blood pressure, adequacy of                         pulmonary ventilation, and response to care were                         monitored throughout the procedure. The physical                         status of the patient was re-assessed after the                         procedure.                        After obtaining informed consent, the colonoscope was                         passed under direct vision. Throughout the procedure,                         the patient's blood pressure, pulse, and oxygen                         saturations were monitored continuously. The  Colonoscope was introduced through the anus and                         advanced to the the terminal ileum, with                         identification of the appendiceal orifice and IC                         valve. The colonoscopy was performed without                         difficulty. The patient tolerated the procedure well.                         The quality of the bowel preparation was evaluated                         using the BBPS [facility] Bowel Preparation Scale) with                         scores of: Right Colon = 1 (portion of mucosa seen,                         but other areas not well seen due to staining,                          residual stool and/or opaque liquid), Transverse Colon                         = 2 (minor amount of residual staining, small                         fragments of stool and/or opaque liquid, but mucosa                         seen well) and Left Colon = 2 (minor amount of                         residual staining, small fragments of stool and/or                         opaque liquid, but mucosa seen well). The total BBPS                         score equals 5. the quality of the bowel preparation                         was fair after thorough washing and suctioning. The                         terminal ileum, ileocecal valve, appendiceal orifice,                         and rectum were photographed. Findings:      Hemorrhoids were found on perianal exam.      The digital rectal exam was  normal. Pertinent negatives include normal       sphincter tone.      The terminal ileum appeared normal. Estimated blood loss: none.      Multiple small-mouthed diverticula were found in the left colon.       Estimated blood loss: none.      Non-bleeding external and internal hemorrhoids were found during       retroflexion.      Three sessile polyps were found in the ascending colon. The polyps were       5 to 8 mm in size. These polyps were removed with a cold snare.       Resection and retrieval were complete. Estimated blood loss was minimal.      A 6 to 7 mm polyp was found in the transverse colon. The polyp was       sessile. The polyp was removed with a cold snare. Resection and       retrieval were complete. Estimated blood loss was minimal.      Two sessile polyps were found in the descending colon. The polyps were 1       to 2 mm in size. These polyps were removed with a cold biopsy forceps.       Resection and retrieval were complete. Estimated blood loss was minimal.      A 7 to 9 mm polyp was found in the sigmoid colon. The polyp was       pedunculated. The polyp was removed with a hot  snare. Resection and       retrieval were complete. To prevent bleeding after the polypectomy, two       hemostatic clips were successfully placed (MR conditional). There was no       bleeding at the end of the procedure. Estimated blood loss was minimal.      A 6 to 7 mm polyp was found in the rectum. The polyp was sessile. The       polyp was removed with a cold snare. Resection and retrieval were       complete. Estimated blood loss was minimal.      The exam was otherwise without abnormality on direct and retroflexion       views.      Due to fair prep, cannot completely rule out small to medium sized polyps Impression:            - Preparation of the colon was fair.                        - Hemorrhoids found on perianal exam.                        - The examined portion of the ileum was normal.                        - Diverticulosis in the left colon.                        - Non-bleeding external and internal hemorrhoids.                        - Three 5 to 8 mm polyps in the ascending colon,  removed with a cold snare. Resected and retrieved.                        - One 6 to 7 mm polyp in the transverse colon, removed                         with a cold snare. Resected and retrieved.                        - Two 1 to 2 mm polyps in the descending colon,                         removed with a cold biopsy forceps. Resected and                         retrieved.                        - One 7 to 9 mm polyp in the sigmoid colon, removed                         with a hot snare. Resected and retrieved. Clips (MR                         conditional) were placed.                        - One 6 to 7 mm polyp in the rectum, removed with a                         cold snare. Resected and retrieved.                        - The examination was otherwise normal on direct and                         retroflexion views. Recommendation:        - Discharge patient to  home.                        - Resume previous diet.                        - No aspirin, ibuprofen, naproxen, or other                         non-steroidal anti-inflammatory drugs for 5 days after                         polyp removal.                        - Resume Plavix (clopidogrel) at prior dose in 5 days.                         Refer to referring physician for further adjustment of  therapy.                        - Await pathology results.                        - Repeat colonoscopy 6-12 months because the bowel                         preparation was poor.                        - Return to GI office as previously scheduled.                        - Due to fair prep, cannot completely rule out small                         to medium sized polyps Procedure Code(s):     --- Professional ---                        [phone], Colonoscopy, flexible; with removal of                         tumor(s), polyp(s), or other lesion(s) by snare                         technique                        45380, 59, Colonoscopy, flexible; with biopsy, single                         or multiple Diagnosis Code(s):     --- Professional ---                        K64.8, Other hemorrhoids                        K63.5, Polyp of colon                        K62.1, Rectal polyp                        R19.5, Other fecal abnormalities                        D64.9, Anemia, unspecified                        K57.30, Diverticulosis of large intestine without                         perforation or abscess without bleeding CPT copyright 2019 American Medical Association. All rights reserved. The codes documented in this report are preliminary and upon coder review may  be revised to meet current compliance requirements. Attending Participation:      I personally performed the entire procedure. Volney American, DO Annamaria Helling DO, DO 04/04/2021 9:07:17 AM This report has been  signed electronically. Number of Addenda: 0 Note Initiated On: 04/04/2021 7:42 AM Scope Withdrawal  Time: 0 hours 40 minutes 38 seconds  Total Procedure Duration: 0 hours 52 minutes 14 seconds  Estimated Blood Loss:  Estimated blood loss was minimal.      [facility]

## 2021-04-04 NOTE — Anesthesia Preprocedure Evaluation (Signed)
Anesthesia Evaluation  Patient identified by MRN, date of birth, ID band Patient awake    Reviewed: Allergy & Precautions, H&P , NPO status , Patient's Chart, lab work & pertinent test results, reviewed documented beta blocker date and time   History of Anesthesia Complications (+) history of anesthetic complications  Airway Mallampati: III   Neck ROM: full    Dental  (+) Poor Dentition   Pulmonary shortness of breath and with exertion, asthma , sleep apnea and Continuous Positive Airway Pressure Ventilation , former smoker,    Pulmonary exam normal        Cardiovascular Exercise Tolerance: Poor hypertension, On Medications + angina with exertion + CAD and +CHF  Normal cardiovascular exam Rhythm:regular Rate:Normal     Neuro/Psych PSYCHIATRIC DISORDERS Depression  Neuromuscular disease    GI/Hepatic Neg liver ROS, GERD  Medicated,  Endo/Other  diabetesHypothyroidism   Renal/GU Renal disease  negative genitourinary   Musculoskeletal   Abdominal   Peds  Hematology negative hematology ROS (+)   Anesthesia Other Findings Past Medical History: No date: Asthma No date: CHF (congestive heart failure) (HCC) No date: Chronic lower back pain No date: Complication of anesthesia     Comment:  unknow "difficulty" after cardiac cath No date: Diabetes mellitus, type 2 (HCC) No date: History of chicken pox No date: Hx of skin cancer, basal cell No date: Hypertension No date: Hypothyroidism No date: Kidney disease     Comment:  stage 3 No date: Neuropathy     Comment:  feet No date: Scoliosis No date: Sleep apnea     Comment:  BiPAP No date: Thyrotoxicosis     Comment:  2010 Past Surgical History: 03/31/2015: CARDIAC CATHETERIZATION; N/A     Comment:  Procedure: Left Heart Cath and Coronary Angiography;                Surgeon: Teodoro Spray, MD;  Location: [place] [facility]               [facility];  Service:  Cardiovascular;  Laterality: N/A; 03/31/2015: CARDIAC CATHETERIZATION; N/A     Comment:  Procedure: Coronary Stent Intervention;  Surgeon:               Teodoro Spray, MD;  Location: [facility];                Service: Cardiovascular;  Laterality: N/A; 03/31/2015: CARDIAC CATHETERIZATION; N/A     Comment:  Procedure: Coronary Stent Intervention;  Surgeon: Yolonda Kida, MD;  Location: [facility];                Service: Cardiovascular;  Laterality: N/A; 05/11/2020: CATARACT EXTRACTION W/PHACO; Right     Comment:  Procedure: CATARACT EXTRACTION PHACO AND INTRAOCULAR               LENS PLACEMENT (IOC) RIGHT DIABETIC 6.52 01:11.6 9.1%;                Surgeon: Leandrew Koyanagi, MD;  Location: [place];  Service: Ophthalmology;  Laterality:               Right;  Diabetic - insulin sleep apnea 06/02/2020: CATARACT EXTRACTION W/PHACO; Left     Comment:  Procedure: CATARACT EXTRACTION PHACO AND INTRAOCULAR  LENS PLACEMENT (IOC) LEFT DIABETIC;  Surgeon: Leandrew Koyanagi, MD;  Location: [place];  Service:               Ophthalmology;  Laterality: Left;  3.14 0:45.0 7.0% 05/12/2019: MOHS SURGERY; Left     Comment:  Left nares by [name], MD [facility] 2005: NASAL SINUS SURGERY 1950: TONSILLECTOMY AND ADENOIDECTOMY   Reproductive/Obstetrics negative OB ROS                             Anesthesia Physical Anesthesia Plan  ASA: 3  Anesthesia Plan: General   Post-op Pain Management:    Induction:   PONV Risk Score and Plan:   Airway Management Planned:   Additional Equipment:   Intra-op Plan:   Post-operative Plan:   Informed Consent: I have reviewed the patients History and Physical, chart, labs and discussed the procedure including the risks, benefits and alternatives for the proposed anesthesia with the patient or authorized  representative who has indicated his/her understanding and acceptance.     Dental Advisory Given  Plan Discussed with: CRNA  Anesthesia Plan Comments:         Anesthesia Quick Evaluation

## 2021-04-04 NOTE — Anesthesia Postprocedure Evaluation (Signed)
Anesthesia Post Note  Patient: Jacob Herrera  Procedure(s) Performed: ESOPHAGOGASTRODUODENOSCOPY (EGD) WITH PROPOFOL COLONOSCOPY WITH PROPOFOL  Patient location during evaluation: PACU Anesthesia Type: General Level of consciousness: awake and alert Pain management: pain level controlled Vital Signs Assessment: post-procedure vital signs reviewed and stable Respiratory status: spontaneous breathing, nonlabored ventilation, respiratory function stable and patient connected to nasal cannula oxygen Cardiovascular status: blood pressure returned to baseline and stable Postop Assessment: no apparent nausea or vomiting Anesthetic complications: no   No notable events documented.   Last Vitals:  Vitals:   04/04/21 0916 04/04/21 0926  BP: 121/60 127/75  Pulse: 72 74  Resp: 13 13  Temp: (!) 35.7 C   SpO2: 97% 96%    Last Pain:  Vitals:   04/04/21 0926  TempSrc:   PainSc: 0-No pain                 Molli Barrows

## 2021-04-04 NOTE — Transfer of Care (Signed)
Immediate Anesthesia Transfer of Care Note  Patient: Jacob Herrera  Procedure(s) Performed: ESOPHAGOGASTRODUODENOSCOPY (EGD) WITH PROPOFOL COLONOSCOPY WITH PROPOFOL  Patient Location: Endoscopy Unit  Anesthesia Type:General  Level of Consciousness: drowsy  Airway & Oxygen Therapy: Patient Spontanous Breathing  Post-op Assessment: Report given to RN and Post -op Vital signs reviewed and stable  Post vital signs: Reviewed and stable  Last Vitals:  Vitals Value Taken Time  BP 111/57 04/04/21 0855  Temp    Pulse 58 04/04/21 0855  Resp 9 04/04/21 0855  SpO2 97 % 04/04/21 0855    Last Pain:  Vitals:   04/04/21 0855  TempSrc:   PainSc: Asleep         Complications: No notable events documented.

## 2021-04-05 ENCOUNTER — Telehealth: Payer: Self-pay | Admitting: Family Medicine

## 2021-04-05 ENCOUNTER — Encounter: Payer: Self-pay | Admitting: Gastroenterology

## 2021-04-05 ENCOUNTER — Encounter: Payer: Self-pay | Admitting: Urology

## 2021-04-05 LAB — SURGICAL PATHOLOGY

## 2021-04-05 NOTE — Telephone Encounter (Signed)
Please review.  Is it okay to prescribe pantoprazole?  Thanks,   -[name]

## 2021-04-05 NOTE — Telephone Encounter (Signed)
Copied from [place] [id number]. Topic: Quick Communication - Rx Refill/Question >> Apr 05, 2021  3:15 PM [name] wrote: Medication: pantoprazole (PROTONIX) 40 MG tablet  Per patient GI Dr want him to take this medication  Has the patient contacted their pharmacy? No. Rx ran out some years ago (Agent: If no, request that the patient contact the pharmacy for the refill. If patient does not wish to contact the pharmacy document the reason why and proceed with request.) (Agent: If yes, when and what did the pharmacy advise?)  Preferred Pharmacy (with phone number or street name): [place], [place] - [place]  Phone:  [phone] Fax:  [phone]    Has the patient been seen for an appointment in the last year OR does the patient have an upcoming appointment? Yes.    Agent: Please be advised that RX refills may take up to 3 business days. We ask that you follow-up with your pharmacy.

## 2021-04-06 ENCOUNTER — Other Ambulatory Visit: Payer: Self-pay | Admitting: Family Medicine

## 2021-04-06 MED ORDER — PANTOPRAZOLE SODIUM 40 MG PO TBEC: 40.0000 mg | DELAYED_RELEASE_TABLET | Freq: Every day | ORAL | 3 refills | Status: DC

## 2021-04-08 ENCOUNTER — Other Ambulatory Visit: Payer: Self-pay | Admitting: Family Medicine

## 2021-04-09 NOTE — Telephone Encounter (Signed)
Requested Prescriptions  Pending Prescriptions Disp Refills   hydrALAZINE (APRESOLINE) 100 MG tablet [Pharmacy Med Name: HYDRALAZINE HCL 100 MG TAB] 90 tablet 0    Sig: TAKE 1 TABLET BY MOUTH 3 TIMES DAILY     Cardiovascular:  Vasodilators Failed - 04/08/2021  4:52 PM      Failed - HCT in normal range and within 360 days    HCT  Date Value Ref Range Status  11/08/2020 32 (A) 41 - 53 Final   Hematocrit  Date Value Ref Range Status  11/09/2014 38.9 37.5 - 51.0 % Final         Failed - HGB in normal range and within 360 days    Hemoglobin  Date Value Ref Range Status  11/08/2020 10.7 (A) 13.5 - 17.5 Final  11/09/2014 12.4 (L) 12.6 - 17.7 g/dL Final         Failed - RBC in normal range and within 360 days    RBC  Date Value Ref Range Status  11/08/2020 3.57 (A) 3.87 - 5.11 Final         Passed - WBC in normal range and within 360 days    WBC  Date Value Ref Range Status  11/08/2020 6.1  Final  07/08/2013 8.0 3.8 - 10.6 x10 3/mm 3 Final         Passed - PLT in normal range and within 360 days    Platelets  Date Value Ref Range Status  11/08/2020 249 150 - 399 Final  11/09/2014 241 150 - 379 x10E3/uL Final         Passed - Last BP in normal range    BP Readings from Last 1 Encounters:  04/04/21 127/75         Passed - Valid encounter within last 12 months    Recent Outpatient Visits          4 months ago Diabetes mellitus with nephropathy [facility])   [facility] [name], MD   9 months ago Diabetes mellitus with nephropathy [facility])   [facility] [name], MD   1 year ago Diabetes mellitus with nephropathy [facility])   [facility] [name], MD   1 year ago Diabetes mellitus with nephropathy [facility])   [facility] [name], MD   1 year ago Diabetes mellitus with nephropathy [facility])   [place], [name], MD      Future Appointments            In 7 months  [place], Ronda Fairly, MD [facility]

## 2021-04-23 DIAGNOSIS — E1121 Type 2 diabetes mellitus with diabetic nephropathy: Secondary | ICD-10-CM | POA: Diagnosis not present

## 2021-04-23 DIAGNOSIS — I5032 Chronic diastolic (congestive) heart failure: Secondary | ICD-10-CM

## 2021-04-26 ENCOUNTER — Ambulatory Visit: Payer: Self-pay | Admitting: *Deleted

## 2021-04-26 NOTE — Telephone Encounter (Signed)
Patient returned call. Appointment rescheduled with Dr. Ky Barban for virtual visit tomorrow at 11:40am.

## 2021-04-26 NOTE — Telephone Encounter (Signed)
Reason for Disposition  [1] Sinus congestion as part of a cold AND [2] present < 10 days  Answer Assessment - Initial Assessment Questions 1. LOCATION: "Where does it hurt?"      No pain 2. ONSET: "When did the sinus pain start?"  (e.g., hours, days)      12/31 3. SEVERITY: "How bad is the pain?"   (Scale 1-10; mild, moderate or severe)   - MILD (1-3): doesn't interfere with normal activities    - MODERATE (4-7): interferes with normal activities (e.g., work or school) or awakens from sleep   - SEVERE (8-10): excruciating pain and patient unable to do any normal activities        No pain 4. RECURRENT SYMPTOM: "Have you ever had sinus problems before?" If Yes, ask: "When was the last time?" and "What happened that time?"      Not in while 5. NASAL CONGESTION: "Is the nose blocked?" If Yes, ask: "Can you open it or must you breathe through your mouth?"     Using saline- clear now- will clog up- using CPAP 6. NASAL DISCHARGE: "Do you have discharge from your nose?" If so ask, "What color?"     Yes- green 7. FEVER: "Do you have a fever?" If Yes, ask: "What is it, how was it measured, and when did it start?"      No- felt feverish first day 8. OTHER SYMPTOMS: "Do you have any other symptoms?" (e.g., sore throat, cough, earache, difficulty breathing)     Sinus drainage-Sore throat, slight chest congestion 9. PREGNANCY: "Is there any chance you are pregnant?" "When was your last menstrual period?"     na  Protocols used: Sinus Pain or Congestion-A-AH

## 2021-04-26 NOTE — Telephone Encounter (Signed)
I called and advised patient that he needs to schedule an appointment for evaluation of symptoms. I offered him an appointment with Daneil Dan tomorrow. He initially declined an appointment, but then agreed to appointment and hung up. I scheduled appointment for tomorrow but then realized that someone else had just took that appointment slot, so the appointment was no longer available. I tried calling patient right back but he didn't answer the phone. I left a detailed message for him to call back so the we could reschedule appointment.

## 2021-04-26 NOTE — Telephone Encounter (Signed)
°  Chief Complaint: nasal congestion Symptoms: green nasal discharge Frequency: started 12/31 Pertinent Negatives: Patient denies fever, chest congestion, negative COVID home test Disposition: [] ED /[] Urgent Care (no appt availability in office) / [] Appointment(In office/virtual)/ []  [place] [facility]/ [x] Home Care/ [] Refused Recommended Disposition /[] [place] [facility]/ []  Follow-up with PCP Additional Notes: Patient requesting antibiotic- advised per office policy- offered appointment tomorrow am- patient insist appointment today and antibiotic-wants to speak to someone who can help him.

## 2021-04-27 ENCOUNTER — Ambulatory Visit: Payer: PPO | Admitting: Family Medicine

## 2021-04-27 ENCOUNTER — Ambulatory Visit (INDEPENDENT_AMBULATORY_CARE_PROVIDER_SITE_OTHER): Payer: PPO | Admitting: Family Medicine

## 2021-04-27 DIAGNOSIS — J069 Acute upper respiratory infection, unspecified: Secondary | ICD-10-CM

## 2021-04-27 MED ORDER — AZITHROMYCIN 250 MG PO TABS: ORAL_TABLET | ORAL | 0 refills | Status: AC

## 2021-04-27 NOTE — Progress Notes (Signed)
Virtual telephone visit    Virtual Visit via Telephone Note   This visit type was conducted due to national recommendations for restrictions regarding the COVID-19 Pandemic (e.g. social distancing) in an effort to limit this patient's exposure and mitigate transmission in our community. Due to his co-morbid illnesses, this patient is at least at moderate risk for complications without adequate follow up. This format is felt to be most appropriate for this patient at this time. The patient did not have access to video technology or had technical difficulties with video requiring transitioning to audio format only (telephone). Physical exam was limited to content and character of the telephone converstion.    Patient location: home Provider location: [facility]  I discussed the limitations of evaluation and management by telemedicine and the availability of in person appointments. The patient expressed understanding and agreed to proceed.   Visit Date: 04/27/2021  Today's healthcare provider: Lelon Huh, MD   No chief complaint on file.  Subjective    URI This is a new problem. The current episode started in the past 7 days. Was initially congested in sinuses, but now moved into chest with some non-productive coughing and sinus drainage. There has been no fever. Had negative test 4 days. Associated symptoms include congestion and coughing. Pertinent negatives include no chills, headaches, hoarse voice, shortness of breath, sinus pressure or sore throat. Past treatments include oral decongestants. The treatment provided no relief.       Medications: Outpatient Medications Prior to Visit  Medication Sig   albuterol (PROVENTIL) (2.5 MG/3ML) 0.083% nebulizer solution Take 3 mLs (2.5 mg total) by nebulization every 4 (four) hours. And PRN (Patient taking differently: Take 2.5 mg by nebulization as needed.)   amLODipine (NORVASC) 10 MG tablet Take 1 tablet (10 mg total) by mouth daily.    aspirin 81 MG tablet Take 81 mg by mouth daily.   atorvastatin (LIPITOR) 80 MG tablet TAKE ONE TABLET AT BEDTIME   calcitRIOL (ROCALTROL) 0.25 MCG capsule Take 0.25 mcg by mouth daily.   carvedilol (COREG) 25 MG tablet TAKE 1 TABLET BY MOUTH TWICE DAILY   Cholecalciferol (VITAMIN D3 PO) Take 1,000 Units by mouth daily.    cloNIDine (CATAPRES) 0.2 MG tablet TAKE 1 TABLET BY MOUTH TWICE DAILY   clopidogrel (PLAVIX) 75 MG tablet Take 1 tablet (75 mg total) by mouth daily.   Continuous Blood Gluc Receiver (FREESTYLE LIBRE 2 READER) DEVI Use to monitor blood sugars as directed   Continuous Blood Gluc Sensor (FREESTYLE LIBRE 2 SENSOR) MISC Place 1 sensor on the skin every 14 days to monitor blood sugars continuously   Cyanocobalamin (VITAMIN B 12 PO) Take 1,000 mcg by mouth daily.    doxazosin (CARDURA) 8 MG tablet TAKE ONE TABLET BY MOUTH EVERY DAY -STOP FLOMAX-   Dulaglutide (TRULICITY) 1.5 UT/6.5YY SOPN Inject 1.5 mg into the skin once a week. [place] patient assistance through Dec 2023   erythromycin ophthalmic ointment Apply 1 a small amount into right eye every night   finasteride (PROSCAR) 5 MG tablet Take 1 tablet (5 mg total) by mouth daily.   fluticasone (FLONASE) 50 MCG/ACT nasal spray USE TWO SPRAYS IN EACH NOSTRIL DAILY   furosemide (LASIX) 40 MG tablet Take 40 mg by mouth 2 (two) times daily.    gabapentin (NEURONTIN) 600 MG tablet TAKE 1 TABLET BY MOUTH 2 TIMES DAILY   hydrALAZINE (APRESOLINE) 100 MG tablet TAKE 1 TABLET BY MOUTH 3 TIMES DAILY   Insulin Glargine (  BASAGLAR KWIKPEN) 100 UNIT/ML Inject 35 Units into the skin daily. [place] patient assistance through Dec 2023   insulin lispro (HUMALOG KWIKPEN) 100 UNIT/ML KwikPen Inject 6-10 Units into the skin 3 (three) times daily. Sliding scale. [place] patient assistance through Dec 2023   Insulin Pen Needle (BD PEN NEEDLE NANO U/F) 32G X 4 MM MISC USE DAILY AS DIRECTED   INVOKANA 100 MG TABS tablet Take 100 mg  by mouth daily. (Patient not taking: Reported on 04/01/2021)   isosorbide mononitrate (IMDUR) 60 MG 24 hr tablet TAKE 1 TABLET BY MOUTH DAILY   levothyroxine (SYNTHROID) 125 MCG tablet TAKE 1 TABLET EVERY DAY ON EMPTY STOMACHWITH A GLASS OF WATER AT LEAST 30-60 MINBEFORE BREAKFAST   losartan (COZAAR) 100 MG tablet Take 1 tablet (100 mg total) by mouth daily.   Lysine 500 MG CAPS Take 1 capsule by mouth daily.   ONETOUCH ULTRA test strip USE AS DIRECTED THREE TIMES A DAY   oxyCODONE (OXY IR/ROXICODONE) 5 MG immediate release tablet Take 1 tablet (5 mg total) by mouth daily as needed for severe pain. (Patient not taking: Reported on 04/01/2021)   pantoprazole (PROTONIX) 40 MG tablet Take 1 tablet (40 mg total) by mouth daily before breakfast.   senna (SENOKOT) 8.6 MG tablet Take by mouth. (Patient not taking: Reported on 04/01/2021)   SENNA PO Take 1 capsule by mouth in the morning, at noon, in the evening, and at bedtime. (Patient not taking: Reported on 04/01/2021)   SYMBICORT 80-4.5 MCG/ACT inhaler INHALE 2 PUFFS INTO LUNGS TWICE DAILY (Patient not taking: Reported on 04/01/2021)   Vitamin D, Cholecalciferol, 10 MCG (400 UNIT) TABS Take by mouth. (Patient not taking: Reported on 04/01/2021)   No facility-administered medications prior to visit.    Review of Systems  Constitutional:  Negative for chills.  HENT:  Positive for congestion and postnasal drip. Negative for rhinorrhea, sinus pressure, sinus pain and sore throat.   Respiratory:  Positive for cough. Negative for apnea, choking, chest tightness, shortness of breath, wheezing and stridor.   Gastrointestinal: Negative.   Neurological:  Negative for dizziness, light-headedness and headaches.     Objective     Awake, alert, oriented x 3. In no apparent distress    Assessment & Plan     1. Upper respiratory tract infection, unspecified type  - azithromycin (ZITHROMAX) 250 MG tablet; Take 2 tablets on day 1, then 1 tablet daily on days  2 through 5  Dispense: 6 tablet; Refill: 0   Can take along with OTC Mucinex for chest congestion.     I discussed the assessment and treatment plan with the patient. The patient was provided an opportunity to ask questions and all were answered. The patient agreed with the plan and demonstrated an understanding of the instructions.   The patient was advised to call back or seek an in-person evaluation if the symptoms worsen or if the condition fails to improve as anticipated.  I provided 8 minutes of non-face-to-face time during this encounter.  The entirety of the information documented in the History of Present Illness, Review of Systems and Physical Exam were personally obtained by me. Portions of this information were initially documented by the CMA and reviewed by me for thoroughness and accuracy.    Lelon Huh, MD [facility] [phone] (phone) [phone] (fax)  [place]

## 2021-05-03 ENCOUNTER — Telehealth: Payer: Self-pay

## 2021-05-03 DIAGNOSIS — J069 Acute upper respiratory infection, unspecified: Secondary | ICD-10-CM

## 2021-05-03 MED ORDER — AZITHROMYCIN 250 MG PO TABS: ORAL_TABLET | ORAL | 0 refills | Status: AC

## 2021-05-03 NOTE — Telephone Encounter (Signed)
Patient states that he was prescribed a z pack for a sinus infection on 04/27/2021. He took the last dose on Sunday. He reports that his symptoms have improved, but he is still blowing out green mucus from his nose. Patient wants to know if Dr. Caryn Section will send in another round of antibiotics? He had an appointment on 05/09/2021 for a CPE which he says he planned asking for more antibiotics then. That appointment had to be pushed out, so patient wants to ask for more antibiotics now. Please advise.

## 2021-05-03 NOTE — Progress Notes (Signed)
Per Clinical pharmacist, Please inform patient I am changing his visit to telephone appointment per patient request on 05/09/2021 at 3:00 pm.  Patient verbalized understanding and appreciation.Patient confirm his appointment on 05/09/2021 at 3:00 pm with the clinical pharmacist.Patient states he has a sinus infection, and hopes the antibiotic he was prescribes takes care of it.   [place] Pharmacist Assistant [phone]

## 2021-05-03 NOTE — Telephone Encounter (Signed)
Patient advised.

## 2021-05-03 NOTE — Telephone Encounter (Signed)
Ok, have sent refill for z-pack.

## 2021-05-06 ENCOUNTER — Other Ambulatory Visit: Payer: Self-pay | Admitting: Family Medicine

## 2021-05-06 DIAGNOSIS — G629 Polyneuropathy, unspecified: Secondary | ICD-10-CM

## 2021-05-09 ENCOUNTER — Ambulatory Visit (INDEPENDENT_AMBULATORY_CARE_PROVIDER_SITE_OTHER): Payer: PPO

## 2021-05-09 ENCOUNTER — Ambulatory Visit: Payer: PPO | Admitting: Family Medicine

## 2021-05-09 DIAGNOSIS — E1121 Type 2 diabetes mellitus with diabetic nephropathy: Secondary | ICD-10-CM

## 2021-05-09 DIAGNOSIS — E785 Hyperlipidemia, unspecified: Secondary | ICD-10-CM

## 2021-05-09 MED ORDER — BASAGLAR KWIKPEN 100 UNIT/ML ~~LOC~~ SOPN: 25.0000 [IU] | PEN_INJECTOR | Freq: Every day | SUBCUTANEOUS | Status: DC

## 2021-05-09 NOTE — Progress Notes (Signed)
Chronic Care Management Pharmacy Note  05/24/2021 Name:  Jacob Herrera MRN:  [id number] DOB:  1950-10-06  Summary: Patient presents for CCM follow-up.   -Patient started on [place] 2 system, consented to sharing data with clinical pharmacist. Patient experiencing multiple instances of nocturnal hypoglycemia   Patient is not routinely monitoring blood pressures.   Recommendations/Changes made from today's visit: -DECREASE Basaglar to 25 units daily.  Plan: CPP follow-up 3 months  Subjective: Jacob Herrera is an 71 y.o. year old male who is a primary patient of [name], [name], MD.  The CCM team was consulted for assistance with disease management and care coordination needs.    Engaged with patient face to face for follow up visit in response to provider referral for pharmacy case management and/or care coordination services.   Consent to Services:  The patient was given information about Chronic Care Management services, agreed to services, and gave verbal consent prior to initiation of services.  Please see initial visit note for detailed documentation.   Patient Care Team: [name], MD as PCP - General (Family Medicine) [name], MD as Consulting Physician (Nephrology) Ubaldo Glassing Javier Docker, MD as Consulting Physician (Cardiology) [name], MD as Referring Physician (Dermatology) [name], MD (Urology) [name], [facility] (Pharmacist) Annamaria Helling, DO as Consulting Physician (Gastroenterology)  Recent office visits: 04/27/21: patient presented to Dr. Caryn Section for URI. Azithromycin.  11/12/20: patient presented to Dr. Caryn Section for follow-up. A1c 6.1%.    Recent consult visits: 02/21/21: Patient presented to Dr. Juleen China (Nephrology) for follow-up. Invokana 100 mg stopped, farxiga 10 mg daily started. 01/12/21: Patient presented to Dr. Mortimer Fries for OSA.  12/20/20: Patient presented to Dr. Ubaldo Glassing (Cardiology) for follow-up. Move losartan  to PM.  11/15/20: Patient presented to Dr. Juleen China (Nephrology) for follow-up. Invokana 100 mg daily.  06/17/20: Patient presented to Dr. Ubaldo Glassing (Cardiology) for follow-up. No medication changes made.   Hospital visits: 06/02/20: Patient hospitalized for cataract extraction.  05/11/20: Patient hospitalized for cataract extraction.   Objective:  Lab Results  Component Value Date   CREATININE 2.1 (A) 11/08/2020   BUN 34 (A) 11/08/2020   GFRNONAA 33 11/08/2020   GFRAA 42 (L) 10/07/2018   NA 139 11/08/2020   K 4.5 11/08/2020   CALCIUM 10.3 11/08/2020   CO2 22 11/08/2020   GLUCOSE 134 (H) 10/07/2018    Lab Results  Component Value Date/Time   HGBA1C 5.4 05/13/2021 03:23 PM   HGBA1C 6.2 (H) 11/12/2020 02:14 PM   HGBA1C 6.3 (A) 11/12/2020 01:50 PM   HGBA1C 6.3 (H) 03/20/2017 03:03 PM   MICROALBUR neg 03/20/2017 02:25 PM    Last diabetic Eye exam:  Lab Results  Component Value Date/Time   HMDIABEYEEXA No Retinopathy 01/24/2021 12:00 AM    Last diabetic Foot exam: No results found for: HMDIABFOOTEX   Lab Results  Component Value Date   CHOL 119 11/12/2020   HDL 33 (L) 11/12/2020   LDLCALC 52 11/12/2020   TRIG 209 (H) 11/12/2020   CHOLHDL 3.6 11/12/2020    Hepatic Function Latest Ref Rng & Units 11/08/2020 10/07/2018 12/21/2016  Total Protein 6.0 - 8.5 g/dL - 6.6 7.0  Albumin 3.5 - 5.0 3.9 4.2 4.3  AST 0 - 40 IU/L - 23 19  ALT 0 - 44 IU/L - 17 21  Alk Phosphatase 39 - 117 IU/L - 47 68  Total Bilirubin 0.0 - 1.2 mg/dL - 0.2 0.2    Lab Results  Component Value  Date/Time   TSH 3.380 11/12/2020 02:14 PM   TSH 2.720 05/26/2019 04:16 PM   FREET4 1.30 11/12/2020 02:14 PM    CBC Latest Ref Rng & Units 11/08/2020 11/09/2014 07/08/2013  WBC - 6.1 6.6 8.0  Hemoglobin 13.5 - 17.5 10.7(A) 12.4(L) 13.5  Hematocrit 41 - 53 32(A) 38.9 41.7  Platelets 150 - 399 249 241 220    Lab Results  Component Value Date/Time   VD25OH 22.8 (L) 05/26/2019 04:16 PM    Clinical ASCVD: No  The  ASCVD Risk score (Arnett DK, et al., 2019) failed to calculate for the following reasons:   The valid total cholesterol range is 130 to 320 mg/dL    Depression screen [facility] 2/9 05/13/2021 11/12/2020 03/15/2020  Decreased Interest 0 0 0  Down, Depressed, Hopeless 0 0 0  PHQ - 2 Score 0 0 0  Altered sleeping 0 1 0  Tired, decreased energy 1 2 0  Change in appetite 0 0 0  Feeling bad or failure about yourself  0 0 0  Trouble concentrating 0 0 0  Moving slowly or fidgety/restless 0 0 0  Suicidal thoughts 0 0 0  PHQ-9 Score 1 3 0  Difficult doing work/chores Not difficult at all Not difficult at all Not difficult at all    Social History   Tobacco Use  Smoking Status Former   Packs/day: 1.00   Years: 25.00   Pack years: 25.00   Types: Cigarettes   Quit date: 05/29/1988   Years since quitting: 33.0  Smokeless Tobacco Current   Types: Chew  Tobacco Comments   chews 1 bag of loose leaf chew/week   BP Readings from Last 3 Encounters:  05/13/21 (!) 139/57  04/04/21 127/75  01/12/21 140/64   Pulse Readings from Last 3 Encounters:  05/13/21 (!) 56  04/04/21 74  01/12/21 68   Wt Readings from Last 3 Encounters:  05/13/21 245 lb (111.1 kg)  04/04/21 237 lb (107.5 kg)  01/12/21 247 lb 11.2 oz (112.4 kg)   BMI Readings from Last 3 Encounters:  05/13/21 37.25 kg/m  04/04/21 36.04 kg/m  01/12/21 37.66 kg/m    Assessment/Interventions: Review of patient past medical history, allergies, medications, health status, including review of consultants reports, laboratory and other test data, was performed as part of comprehensive evaluation and provision of chronic care management services.   SDOH:  (Social Determinants of Health) assessments and interventions performed: Yes SDOH Interventions    Flowsheet Row Most Recent Value  SDOH Interventions   Financial Strain Interventions Intervention Not Indicated         CCM Care Plan  Allergies  Allergen Reactions   Chlorthalidone  Other (See Comments)    D/c by dr. Juleen China for worsening renal function 03/2019   Spironolactone Other (See Comments)    gynecomastia    Medications Reviewed Today     Reviewed by [name], MD (Physician) on 05/13/21 at 68  Med List Status: <None>   Medication Order Taking? Sig Documenting Provider Last Dose Status Informant  albuterol (PROVENTIL) (2.5 MG/3ML) 0.083% nebulizer solution [id number] Yes Take 3 mLs (2.5 mg total) by nebulization every 4 (four) hours. And PRN  Patient taking differently: Take 2.5 mg by nebulization as needed.   Flora Lipps, MD Taking Active            Med Note [facility], Ria Comment S   Tue May 11, 2020  8:29 AM) PRN, hasnt had to use in 2-3 months  amLODipine (NORVASC) 10  MG tablet [id number] Yes Take 1 tablet (10 mg total) by mouth daily. Teodoro Spray, MD Taking Active   aspirin 81 MG tablet [id number] Yes Take 81 mg by mouth daily. [provider] Taking Active   atorvastatin (LIPITOR) 80 MG tablet [id number] Yes TAKE ONE TABLET AT BEDTIME [name], MD Taking Active   calcitRIOL (ROCALTROL) 0.25 MCG capsule [id number] Yes Take 0.25 mcg by mouth daily. [provider] Taking Active   carvedilol (COREG) 25 MG tablet [id number] Yes TAKE 1 TABLET BY MOUTH TWICE DAILY [name], MD Taking Active   cloNIDine (CATAPRES) 0.2 MG tablet [id number] Yes TAKE 1 TABLET BY MOUTH TWICE DAILY [name], MD Taking Active   clopidogrel (PLAVIX) 75 MG tablet [id number] Yes Take 1 tablet (75 mg total) by mouth daily. Teodoro Spray, MD Taking Active   Continuous Blood Gluc Receiver (FREESTYLE LIBRE 2 READER) [place] [id number] Yes Use to monitor blood sugars as directed [name], [name], MD Taking Active   Continuous Blood Gluc Sensor (FREESTYLE LIBRE 2 SENSOR) [place] [id number] Yes Place 1 sensor on the skin every 14 days to monitor blood sugars continuously [name], MD Taking Active   Cyanocobalamin (VITAMIN B 12 PO) [id number] Yes Take  1,000 mcg by mouth daily.  [provider] Taking Active Self  doxazosin (CARDURA) 8 MG tablet [id number] Yes TAKE ONE TABLET BY MOUTH EVERY DAY -STOP FLOMAX- [name], MD Taking Active   Dulaglutide (TRULICITY) 1.5 OE/3.2ZY SOPN [id number] Yes Inject 1.5 mg into the skin once a week. [place] patient assistance through Dec 2023 [name], MD Taking Active   erythromycin ophthalmic ointment [id number] Yes Apply 1 a small amount into right eye every night [provider] Taking Active   finasteride (PROSCAR) 5 MG tablet [id number] Yes Take 1 tablet (5 mg total) by mouth daily. [name], MD Taking Active   fluticasone [facility]) 50 MCG/ACT nasal spray [id number] Yes USE TWO SPRAYS IN EACH NOSTRIL DAILY [name], MD Taking Active   furosemide (LASIX) 40 MG tablet [id number] Yes Take 40 mg by mouth 2 (two) times daily.  [provider] Taking Active   gabapentin (NEURONTIN) 600 MG tablet [id number] Yes TAKE 1 TABLET BY MOUTH 2 TIMES DAILY [name], MD Taking Active   hydrALAZINE (APRESOLINE) 100 MG tablet [id number] Yes TAKE 1 TABLET BY MOUTH 3 TIMES DAILY [name], MD Taking Active   Insulin Glargine [facility] KWIKPEN) 100 UNIT/ML [id number] Yes Inject 25 Units into the skin daily. [place] patient assistance through Dec 2023 [name], MD Taking Active   insulin lispro (HUMALOG KWIKPEN) 100 UNIT/ML KwikPen [id number] Yes Inject 6-10 Units into the skin 3 (three) times daily. Sliding scale. [place] patient assistance through Dec 2023 [name], MD Taking Active   Insulin Pen Needle (BD PEN NEEDLE NANO U/F) 32G X 4 MM MISC [id number] Yes USE DAILY AS DIRECTED [name], MD Taking Active   INVOKANA 100 MG TABS tablet [id number] Yes Take 100 mg by mouth daily. [provider] Taking Active Self  isosorbide mononitrate (IMDUR) 60 MG 24 hr tablet [id number] Yes TAKE 1 TABLET BY MOUTH DAILY [name], MD Taking Active   levothyroxine (SYNTHROID) 125 MCG tablet [id number] Yes TAKE 1 TABLET EVERY DAY ON EMPTY STOMACHWITH A GLASS OF WATER AT LEAST 30-60 MINBEFORE BREAKFAST [name], MD Taking Active   losartan (COZAAR) 100 MG  tablet [id number] Yes Take 1 tablet (100 mg total) by mouth daily. Teodoro Spray, MD Taking Active   Lysine 500 MG CAPS [id number] Yes Take 1 capsule by mouth daily. [provider] Taking Active   [facility] ULTRA test strip [id number] Yes USE AS DIRECTED THREE TIMES A DAY [name], MD Taking Active   oxyCODONE (OXY IR/ROXICODONE) 5 MG immediate release tablet [id number] No Take 1 tablet (5 mg total) by mouth daily as needed for severe pain.  Patient not taking: Reported on 05/13/2021   [name], MD Not Taking Active Self  pantoprazole (PROTONIX) 40 MG tablet [id number] Yes Take 1 tablet (40 mg total) by mouth daily before breakfast. [name], MD Taking Active   senna (SENOKOT) 8.6 MG tablet [id number] No Take by mouth.  Patient not taking: Reported on 05/13/2021   [provider] Not Taking Active Self  SENNA PO [id number] Yes Take 1 capsule by mouth in the morning, at noon, in the evening, and at bedtime. [provider] Taking Active Self  SYMBICORT 80-4.5 MCG/ACT inhaler [id number] Yes INHALE 2 PUFFS INTO LUNGS TWICE DAILY Flora Lipps, MD Taking Active Self  Med List Note Margarita Mail, RN 03/31/15 1507): UDS done 08-11-14 Meds to last until 10-09-14            Patient Active Problem List   Diagnosis Date Noted   Vitamin D deficiency 05/27/2019   Vitamin B12 deficiency 05/27/2019   Benign hypertensive kidney disease with chronic kidney disease 04/23/2019   Nephrotic syndrome, focal and segmental glomerular lesions 04/23/2019   Proteinuria 04/23/2019   Secondary hyperparathyroidism of renal origin ([place]) 04/23/2019   Trigger middle finger of right hand 01/20/2019   Carpal tunnel syndrome, right  01/20/2019   Focal segmental glomerulosclerosis 03/26/2018   Primary osteoarthritis of first carpometacarpal joint of right hand 07/30/2016   Coronary artery disease with unstable angina pectoris ([place]) 03/31/2015   Atherosclerotic heart disease of native coronary artery with unstable angina pectoris ([place]) 70/96/2836   Diastolic heart failure ([place]) 02/15/2015   Regional wall motion abnormality of heart 02/15/2015   Abnormal findings on diagnostic imaging of heart and coronary circulation 02/15/2015   Restrictive lung disease 11/10/2014   History of adenomatous polyp of colon 11/09/2014   Hypothyroidism 11/09/2014   Diabetes mellitus with nephropathy ([place]) 11/09/2014   Hyperlipidemia 11/09/2014   Depression 11/09/2014   Insomnia 11/09/2014   Resistant hypertension 11/09/2014   GERD (gastroesophageal reflux disease) 11/09/2014   Chronic kidney disease (CKD), stage III (moderate) ([place]) 11/09/2014   Dyspnea on exertion 11/09/2014   History of basal cell cancer 09/15/2014   DDD (degenerative disc disease), lumbar 09/09/2014   Facet syndrome, lumbar 09/09/2014   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   Greater trochanteric bursitis of both hips 09/09/2014   Allergic rhinitis 08/12/2014   Benign prostatic hyperplasia with urinary obstruction 06/24/2013   Nocturia 05/30/2013   Frequency of micturition 05/30/2013   Urge incontinence 05/30/2013   Asthma, chronic 05/29/2013   Morbid obesity ([place]) 05/29/2013   OSA treated with BiPAP 05/29/2013   Scoliosis 11/28/2011   SCC (squamous cell carcinoma), face 08/01/2011    Immunization History  Administered Date(s) Administered   Fluad Quad(high Dose 65+) 01/21/2019   Influenza Split 02/26/2013, 01/12/2014   Influenza, High Dose Seasonal PF 02/09/2015, 03/07/2016, 03/20/2017, 02/13/2018, 01/30/2020   PFIZER Comirnaty(Gray Top)Covid-19 Tri-Sucrose Vaccine 06/17/2019, 07/08/2019   PFIZER(Purple Top)SARS-COV-2 Vaccination 06/17/2019, 07/08/2019,  01/30/2020   Pneumococcal Conjugate-13 01/16/2014   Pneumococcal Polysaccharide-23  07/07/2008, 03/07/2016   Td 01/05/2003   Tdap 07/07/2008   Zoster Recombinat (Shingrix) 09/28/2017, 12/21/2017    Conditions to be addressed/monitored:  Hypertension, Hyperlipidemia, Diabetes, Heart Failure, Asthma, Chronic Kidney Disease, Hypothyroidism, Depression, BPH and Insomnia  Care Plan : General Pharmacy (Adult)  Updates made by [name], RPH since 05/24/2021 12:00 AM     Problem: Hypertension, Hyperlipidemia, Diabetes, Heart Failure, Asthma, Chronic Kidney Disease, Hypothyroidism, Depression, BPH and Insomnia   Priority: High     Long-Range Goal: Patient-Specific Goal   Start Date: 07/13/2020  Expected End Date: 01/04/2022  This Visit's Progress: On track  Recent Progress: On track  Priority: High  Note:   Current Barriers:  Unable to independently afford treatment regimen  Pharmacist Clinical Goal(s):  Patient will verbalize ability to afford treatment regimen through collaboration with PharmD and provider.   Interventions: 1:1 collaboration with [name], MD regarding development and update of comprehensive plan of care as evidenced by provider attestation and co-signature Inter-disciplinary care team collaboration (see longitudinal plan of care) Comprehensive medication review performed; medication list updated in electronic medical record  Hyperlipidemia: (LDL goal < 70) -Controlled -History of CAD s/p PCI  -Current treatment: Atorvastatin 80 mg daily: Appropriate, Effective, Query Safe, Accessible   -Medications previously tried: NA  -Educated on Importance of limiting foods high in cholesterol; -Recommended to continue current medication  Diabetes (A1c goal <8%) -Controlled -Current medications: Trulicity 1.5 mg weekly (fridays): Appropriate, Effective, Safe, Accessible  Humalog sliding scale (4-8 units typically) -adjusts based on carb intake:  Appropriate, Effective, Safe, Accessible   Basaglar 30 units daily: Appropriate, Effective, Query Safe, Accessible   Invokana 100 mg daily: Appropriate, Effective, Safe, Accessible   -Medications previously tried: Bydureon, Glipizide, Victoza, metformin, Ozempic, Actos, Januvia  -Patient started on [place] 2 system, consented to sharing data with clinical pharmacist.  -Current home glucose readings  Target Date(s)  Number of days worn ? 14 days 1/3-1/16  % of time active ? 70% 92%  Mean Glucose (mg/dL)  104   GMI (2-week A1c estimate)  5.8%  Glycemic Variability (%CV) ?36% 21.3%  Time above >250 mg/dL <10% 0%  Time above 70-180 mg/dL <50% 0%  Time in range: 70-180 mg/dL >50% 96%  Time below 70 mg/dL <1% 4%  -Denies hypoglycemic/hyperglycemic symptoms -Current exercise: Sedentary. Patient is limited by sciatica, neuropathy pain.  -DECREASE Basaglar to 25 units daily.  Heart Failure (Goal: manage symptoms and prevent exacerbations) -Controlled -Last ejection fraction: 50% -HF type: Diastolic -NYHA Class: II (slight limitation of activity) -AHA HF Stage: C (Heart disease and symptoms present) -Current treatment: Amlodipine 10 mg daily  Carvedilol 25 mg twice daily  Clonidine 0.3 mg twice daily  Doxazosin 8 mg daily  Furosemide 40 mg twice daily  Hydralazine 100 mg three times daily  Imdur 60 mg daily  Losartan 100 mg nightly  -Medications previously tried: Chlorthalidone, spironolactone, nebivolol   -Current home BP/HR readings: NA, does not monitor at home. -Recommended to continue current medication  Hypothyroidism (Goal: Maintain stable thyroid function) -Controlled -Current treatment  Levothyroxine 125 mcg daily before breakfast  -Medications previously tried: NA  -Recommended to continue current medication  Chronic Kidney Disease Stage 3b  -All medications assessed for renal dosing and appropriateness in chronic kidney disease. -Recommended to continue  current medication  Patient Goals/Self-Care Activities Patient will:  - check glucose 3-4 times daily: before meals and at bedtime, document, and provide at future appointments check blood pressure weekly, document, and provide  at future appointments  Follow Up Plan:Telephone follow up appointment with care management team member scheduled for:  08/15/2021 at 3:00 PM    Medication Assistance:  Humalog, Basaglar, Trulicity obtained through [employer] medication assistance program.  Enrollment ends Dec 2023   Invokana patient assistance denied.   Patient's preferred pharmacy is:  [facility] - [place], [place] - [address] [address] Phone: [phone] Fax: [phone]  Uses pill box? Yes Pt endorses 100% compliance  We discussed: Current pharmacy is preferred with insurance plan and patient is satisfied with pharmacy services Patient decided to: Continue current medication management strategy  Care Plan and Follow Up Patient Decision:  Patient agrees to Care Plan and Follow-up.  Plan: Telephone follow up appointment with care management team member scheduled for:  08/15/2021 at 3:00 PM  [name], PharmD, [place], [place] Pharmacist Practitioner  [facility] [phone]

## 2021-05-13 ENCOUNTER — Other Ambulatory Visit: Payer: Self-pay

## 2021-05-13 ENCOUNTER — Encounter: Payer: Self-pay | Admitting: Family Medicine

## 2021-05-13 ENCOUNTER — Ambulatory Visit (INDEPENDENT_AMBULATORY_CARE_PROVIDER_SITE_OTHER): Payer: PPO | Admitting: Family Medicine

## 2021-05-13 VITALS — BP 139/57 | HR 56 | Temp 98.1°F | Resp 20 | Ht 68.0 in | Wt 245.0 lb

## 2021-05-13 DIAGNOSIS — Z0000 Encounter for general adult medical examination without abnormal findings: Secondary | ICD-10-CM

## 2021-05-13 DIAGNOSIS — Z794 Long term (current) use of insulin: Secondary | ICD-10-CM

## 2021-05-13 DIAGNOSIS — E1122 Type 2 diabetes mellitus with diabetic chronic kidney disease: Secondary | ICD-10-CM | POA: Diagnosis not present

## 2021-05-13 DIAGNOSIS — N1832 Chronic kidney disease, stage 3b: Secondary | ICD-10-CM | POA: Diagnosis not present

## 2021-05-13 LAB — POCT GLYCOSYLATED HEMOGLOBIN (HGB A1C)
Est. average glucose Bld gHb Est-mCnc: 108
Hemoglobin A1C: 5.4 % (ref 4.0–5.6)

## 2021-05-13 NOTE — Progress Notes (Signed)
Annual Wellness Visit     Patient: Jacob Herrera, Male    DOB: [birth year], 71 y.o.   MRN: [id number] Visit Date: 05/13/2021  Today's Provider: Lelon Huh, MD   Chief Complaint  Patient presents with   Medicare Wellness   Diabetes   Hyperlipidemia   Hypertension   Subjective    Jacob Herrera is a 71 y.o. male who presents today for his Annual Wellness Visit.   Medications: Outpatient Medications Prior to Visit  Medication Sig   albuterol (PROVENTIL) (2.5 MG/3ML) 0.083% nebulizer solution Take 3 mLs (2.5 mg total) by nebulization every 4 (four) hours. And PRN (Patient taking differently: Take 2.5 mg by nebulization as needed.)   amLODipine (NORVASC) 10 MG tablet Take 1 tablet (10 mg total) by mouth daily.   aspirin 81 MG tablet Take 81 mg by mouth daily.   atorvastatin (LIPITOR) 80 MG tablet TAKE ONE TABLET AT BEDTIME   calcitRIOL (ROCALTROL) 0.25 MCG capsule Take 0.25 mcg by mouth daily.   carvedilol (COREG) 25 MG tablet TAKE 1 TABLET BY MOUTH TWICE DAILY   cloNIDine (CATAPRES) 0.2 MG tablet TAKE 1 TABLET BY MOUTH TWICE DAILY   clopidogrel (PLAVIX) 75 MG tablet Take 1 tablet (75 mg total) by mouth daily.   Continuous Blood Gluc Receiver (FREESTYLE LIBRE 2 READER) DEVI Use to monitor blood sugars as directed   Continuous Blood Gluc Sensor (FREESTYLE LIBRE 2 SENSOR) MISC Place 1 sensor on the skin every 14 days to monitor blood sugars continuously   Cyanocobalamin (VITAMIN B 12 PO) Take 1,000 mcg by mouth daily.    doxazosin (CARDURA) 8 MG tablet TAKE ONE TABLET BY MOUTH EVERY DAY -STOP FLOMAX-   Dulaglutide (TRULICITY) 1.5 JQ/3.0SP SOPN Inject 1.5 mg into the skin once a week. [place] patient assistance through Dec 2023   erythromycin ophthalmic ointment Apply 1 a small amount into right eye every night   finasteride (PROSCAR) 5 MG tablet Take 1 tablet (5 mg total) by mouth daily.   fluticasone (FLONASE) 50 MCG/ACT nasal spray USE TWO SPRAYS IN EACH  NOSTRIL DAILY   furosemide (LASIX) 40 MG tablet Take 40 mg by mouth 2 (two) times daily.    gabapentin (NEURONTIN) 600 MG tablet TAKE 1 TABLET BY MOUTH 2 TIMES DAILY   hydrALAZINE (APRESOLINE) 100 MG tablet TAKE 1 TABLET BY MOUTH 3 TIMES DAILY   Insulin Glargine (BASAGLAR KWIKPEN) 100 UNIT/ML Inject 25 Units into the skin daily. [place] patient assistance through Dec 2023   insulin lispro (HUMALOG KWIKPEN) 100 UNIT/ML KwikPen Inject 6-10 Units into the skin 3 (three) times daily. Sliding scale. [place] patient assistance through Dec 2023   Insulin Pen Needle (BD PEN NEEDLE NANO U/F) 32G X 4 MM MISC USE DAILY AS DIRECTED   INVOKANA 100 MG TABS tablet Take 100 mg by mouth daily.   isosorbide mononitrate (IMDUR) 60 MG 24 hr tablet TAKE 1 TABLET BY MOUTH DAILY   levothyroxine (SYNTHROID) 125 MCG tablet TAKE 1 TABLET EVERY DAY ON EMPTY STOMACHWITH A GLASS OF WATER AT LEAST 30-60 MINBEFORE BREAKFAST   losartan (COZAAR) 100 MG tablet Take 1 tablet (100 mg total) by mouth daily.   Lysine 500 MG CAPS Take 1 capsule by mouth daily.   ONETOUCH ULTRA test strip USE AS DIRECTED THREE TIMES A DAY   pantoprazole (PROTONIX) 40 MG tablet Take 1 tablet (40 mg total) by mouth daily before breakfast.   SENNA PO Take 1 capsule  by mouth in the morning, at noon, in the evening, and at bedtime.   SYMBICORT 80-4.5 MCG/ACT inhaler INHALE 2 PUFFS INTO LUNGS TWICE DAILY   oxyCODONE (OXY IR/ROXICODONE) 5 MG immediate release tablet Take 1 tablet (5 mg total) by mouth daily as needed for severe pain. (Patient not taking: Reported on 05/13/2021)   senna (SENOKOT) 8.6 MG tablet Take by mouth. (Patient not taking: Reported on 05/13/2021)   [DISCONTINUED] Cholecalciferol (VITAMIN D3 PO) Take 1,000 Units by mouth daily.  (Patient not taking: Reported on 05/13/2021)   [DISCONTINUED] Vitamin D, Cholecalciferol, 10 MCG (400 UNIT) TABS Take by mouth.   No facility-administered medications prior to visit.    Allergies   Allergen Reactions   Chlorthalidone Other (See Comments)    D/c by dr. Juleen China for worsening renal function 03/2019   Spironolactone Other (See Comments)    gynecomastia    Patient Care Team: [name], MD as PCP - General (Family Medicine) [name], MD as Consulting Physician (Nephrology) Ubaldo Glassing Javier Docker, MD as Consulting Physician (Cardiology) [name], MD as Referring Physician (Dermatology) [name], MD (Urology) [name], [facility] (Pharmacist) Annamaria Helling, DO as Consulting Physician (Gastroenterology)    Objective     Most recent functional status assessment: In your present state of health, do you have any difficulty performing the following activities: 05/13/2021  Hearing? Y  Vision? Y  Difficulty concentrating or making decisions? N  Walking or climbing stairs? Y  Dressing or bathing? N  Doing errands, shopping? N  Some recent data might be hidden   Most recent fall risk assessment: Fall Risk  05/13/2021  Falls in the past year? 0  Number falls in past yr: 0  Injury with Fall? 0  Risk for fall due to : No Fall Risks  Follow up Falls evaluation completed    Most recent depression screenings: PHQ 2/9 Scores 05/13/2021 11/12/2020  PHQ - 2 Score 0 0  PHQ- 9 Score 1 3   Most recent cognitive screening: No flowsheet data found. Most recent Audit-C alcohol use screening Alcohol Use Disorder Test (AUDIT) 05/13/2021  1. How often do you have a drink containing alcohol? 0  2. How many drinks containing alcohol do you have on a typical day when you are drinking? 0  3. How often do you have six or more drinks on one occasion? 0  AUDIT-C Score 0  Alcohol Brief Interventions/Follow-up -   A score of 3 or more in women, and 4 or more in men indicates increased risk for alcohol abuse, EXCEPT if all of the points are from question 1   Results for orders placed or performed in visit on 05/13/21  POCT HgB A1C  Result Value Ref Range    Hemoglobin A1C 5.4 4.0 - 5.6 %   Est. average glucose Bld gHb Est-mCnc 108     Assessment & Plan     Annual wellness visit done today including the all of the following: Reviewed patient's Family Medical History Reviewed and updated list of patient's medical providers Assessment of cognitive impairment was done Assessed patient's functional ability Established a written schedule for health screening [place] Completed and Reviewed  Exercise Activities and Dietary recommendations  Goals      Monitor and Manage My Blood Sugar-Diabetes Type 2     Timeframe:  Long-Range Goal Priority:  High Start Date:  07/13/20  Expected End Date: 01/13/22                      Follow Up within 90 days    -check blood sugar 2-4 times daily: Before meals and at bedtime - check blood sugar if I feel it is too high or too low - enter blood sugar readings and medication or insulin into daily log - take the blood sugar log to all doctor visits    Why is this important?   Checking your blood sugar at home helps to keep it from getting very high or very low.  Writing the results in a diary or log helps the doctor know how to care for you.  Your blood sugar log should have the time, date and the results.  Also, write down the amount of insulin or other medicine that you take.  Other information, like what you ate, exercise done and how you were feeling, will also be helpful.     Notes:         Immunization History  Administered Date(s) Administered   Fluad Quad(high Dose 65+) 01/21/2019   Influenza Split 02/26/2013, 01/12/2014   Influenza, High Dose Seasonal PF 02/09/2015, 03/07/2016, 03/20/2017, 02/13/2018, 01/30/2020   PFIZER Comirnaty(Gray Top)Covid-19 Tri-Sucrose Vaccine 06/17/2019, 07/08/2019   PFIZER(Purple Top)SARS-COV-2 Vaccination 06/17/2019, 07/08/2019, 01/30/2020   Pneumococcal Conjugate-13 01/16/2014   Pneumococcal Polysaccharide-23  07/07/2008, 03/07/2016   Td 01/05/2003   Tdap 07/07/2008   Zoster Recombinat (Shingrix) 09/28/2017, 12/21/2017    Health Maintenance  Topic Date Due   TETANUS/TDAP  07/08/2018   COVID-19 Vaccine (6 - Booster) 03/26/2020   INFLUENZA VACCINE  07/22/2021 (Originally 11/22/2020)   COLONOSCOPY (Pts 45-79yrs Insurance coverage will need to be confirmed)  04/04/2026   Pneumonia Vaccine 9+ Years old  Completed   Hepatitis C Screening  Completed   Zoster Vaccines- Shingrix  Completed   HPV VACCINES  Aged Out     Discussed health benefits of physical activity, and encouraged him to engage in regular exercise appropriate for his age and condition.        The entirety of the information documented in the History of Present Illness, Review of Systems and Physical Exam were personally obtained by me. Portions of this information were initially documented by the CMA and reviewed by me for thoroughness and accuracy.     Lelon Huh, MD  [facility] [phone] (phone) [phone] (fax)  [place]

## 2021-05-13 NOTE — Progress Notes (Signed)
Complete Physical Exam     Patient: Jacob Herrera, Male    DOB: [birth year], 71 y.o.   MRN: [id number] Visit Date: 05/13/2021  Today's Provider: Lelon Huh, MD   Chief Complaint  Patient presents with   Medicare Wellness   Diabetes   Hyperlipidemia   Hypertension   Subjective    Jacob Herrera is a 71 y.o. male who presents today for his complete physical examination. He reports consuming a general diet. The patient does not participate in regular exercise at present. He generally feels fairly well. He reports sleeping fairly well. He does have additional problems to discuss today.   HPI Diabetes Mellitus Type II, Follow-up  Lab Results  Component Value Date   HGBA1C 5.4 05/13/2021   HGBA1C 6.2 (H) 11/12/2020   HGBA1C 6.3 (A) 11/12/2020   Wt Readings from Last 3 Encounters:  05/13/21 245 lb (111.1 kg)  04/04/21 237 lb (107.5 kg)  01/12/21 247 lb 11.2 oz (112.4 kg)   Last seen for diabetes 5 months ago.  Management since then includes continue same medication. He reports good compliance with treatment. He is not having side effects.  Symptoms: No fatigue No foot ulcerations  No appetite changes No nausea  No paresthesia of the feet  No polydipsia  No polyuria No visual disturbances   No vomiting     Home blood sugar records:  has freestyle Libre  Episodes of hypoglycemia? No    Current insulin regiment: Basaglar and Humalog Most Recent Eye Exam: 01/24/2021 Current exercise: none Current diet habits: well balanced  ---------------------------------------------------------------------------------------------------   Lipid/Cholesterol, Follow-up  Last lipid panel Other pertinent labs  Lab Results  Component Value Date   CHOL 119 11/12/2020   HDL 33 (L) 11/12/2020   LDLCALC 52 11/12/2020   TRIG 209 (H) 11/12/2020   CHOLHDL 3.6 11/12/2020   Lab Results  Component Value Date   ALT 17 10/07/2018   AST 23 10/07/2018   PLT 249 11/08/2020    TSH 3.380 11/12/2020     He was last seen for this 5 months ago.  Management since that visit includes continue same medication.  He reports good compliance with treatment. He is not having side effects.   Symptoms: No chest pain No chest pressure/discomfort  No dyspnea No lower extremity edema  No numbness or tingling of extremity No orthopnea  No palpitations No paroxysmal nocturnal dyspnea  No speech difficulty No syncope   Current diet: well balanced Current exercise: none  The ASCVD Risk score (Arnett DK, et al., 2019) failed to calculate for the following reasons:   The valid total cholesterol range is 130 to 320 mg/dL  ---------------------------------------------------------------------------------------------------   Hypertension, follow-up  BP Readings from Last 3 Encounters:  05/13/21 (!) 139/57  04/04/21 127/75  01/12/21 140/64   Wt Readings from Last 3 Encounters:  05/13/21 245 lb (111.1 kg)  04/04/21 237 lb (107.5 kg)  01/12/21 247 lb 11.2 oz (112.4 kg)     He was last seen for hypertension 5 months ago.  BP at that visit was 113/58. Management since that visit includes continue same medication.  He reports good compliance with treatment. He is not having side effects.  He is following a Regular diet. He is not exercising. He does not smoke.  Use of agents associated with hypertension: NSAIDS and thyroid hormones.   Outside blood pressures are not checked. Symptoms: No chest pain No chest pressure  No palpitations No syncope  Yes dyspnea  No orthopnea  No paroxysmal nocturnal dyspnea No lower extremity edema   Pertinent labs: Lab Results  Component Value Date   NA 139 11/08/2020   K 4.5 11/08/2020   CREATININE 2.1 (A) 11/08/2020   GFRNONAA 33 11/08/2020   GLUCOSE 134 (H) 10/07/2018   TSH 3.380 11/12/2020     The ASCVD Risk score (Arnett DK, et al., 2019) failed to calculate for the following reasons:   The valid total cholesterol range is  130 to 320 mg/dL   ---------------------------------------------------------------------------------------------------    Medications: Outpatient Medications Prior to Visit  Medication Sig   albuterol (PROVENTIL) (2.5 MG/3ML) 0.083% nebulizer solution Take 3 mLs (2.5 mg total) by nebulization every 4 (four) hours. And PRN (Patient taking differently: Take 2.5 mg by nebulization as needed.)   amLODipine (NORVASC) 10 MG tablet Take 1 tablet (10 mg total) by mouth daily.   aspirin 81 MG tablet Take 81 mg by mouth daily.   atorvastatin (LIPITOR) 80 MG tablet TAKE ONE TABLET AT BEDTIME   calcitRIOL (ROCALTROL) 0.25 MCG capsule Take 0.25 mcg by mouth daily.   carvedilol (COREG) 25 MG tablet TAKE 1 TABLET BY MOUTH TWICE DAILY   cloNIDine (CATAPRES) 0.2 MG tablet TAKE 1 TABLET BY MOUTH TWICE DAILY   clopidogrel (PLAVIX) 75 MG tablet Take 1 tablet (75 mg total) by mouth daily.   Continuous Blood Gluc Receiver (FREESTYLE LIBRE 2 READER) DEVI Use to monitor blood sugars as directed   Continuous Blood Gluc Sensor (FREESTYLE LIBRE 2 SENSOR) MISC Place 1 sensor on the skin every 14 days to monitor blood sugars continuously   Cyanocobalamin (VITAMIN B 12 PO) Take 1,000 mcg by mouth daily.    doxazosin (CARDURA) 8 MG tablet TAKE ONE TABLET BY MOUTH EVERY DAY -STOP FLOMAX-   Dulaglutide (TRULICITY) 1.5 IO/0.3TD SOPN Inject 1.5 mg into the skin once a week. [place] patient assistance through Dec 2023   erythromycin ophthalmic ointment Apply 1 a small amount into right eye every night   finasteride (PROSCAR) 5 MG tablet Take 1 tablet (5 mg total) by mouth daily.   fluticasone (FLONASE) 50 MCG/ACT nasal spray USE TWO SPRAYS IN EACH NOSTRIL DAILY   furosemide (LASIX) 40 MG tablet Take 40 mg by mouth 2 (two) times daily.    gabapentin (NEURONTIN) 600 MG tablet TAKE 1 TABLET BY MOUTH 2 TIMES DAILY   hydrALAZINE (APRESOLINE) 100 MG tablet TAKE 1 TABLET BY MOUTH 3 TIMES DAILY   Insulin Glargine (BASAGLAR  KWIKPEN) 100 UNIT/ML Inject 25 Units into the skin daily. [place] patient assistance through Dec 2023   insulin lispro (HUMALOG KWIKPEN) 100 UNIT/ML KwikPen Inject 6-10 Units into the skin 3 (three) times daily. Sliding scale. [place] patient assistance through Dec 2023   Insulin Pen Needle (BD PEN NEEDLE NANO U/F) 32G X 4 MM MISC USE DAILY AS DIRECTED   INVOKANA 100 MG TABS tablet Take 100 mg by mouth daily.   isosorbide mononitrate (IMDUR) 60 MG 24 hr tablet TAKE 1 TABLET BY MOUTH DAILY   levothyroxine (SYNTHROID) 125 MCG tablet TAKE 1 TABLET EVERY DAY ON EMPTY STOMACHWITH A GLASS OF WATER AT LEAST 30-60 MINBEFORE BREAKFAST   losartan (COZAAR) 100 MG tablet Take 1 tablet (100 mg total) by mouth daily.   Lysine 500 MG CAPS Take 1 capsule by mouth daily.   ONETOUCH ULTRA test strip USE AS DIRECTED THREE TIMES A DAY   pantoprazole (PROTONIX) 40 MG tablet Take 1 tablet (40 mg total)  by mouth daily before breakfast.   SENNA PO Take 1 capsule by mouth in the morning, at noon, in the evening, and at bedtime.   SYMBICORT 80-4.5 MCG/ACT inhaler INHALE 2 PUFFS INTO LUNGS TWICE DAILY   oxyCODONE (OXY IR/ROXICODONE) 5 MG immediate release tablet Take 1 tablet (5 mg total) by mouth daily as needed for severe pain. (Patient not taking: Reported on 05/13/2021)   senna (SENOKOT) 8.6 MG tablet Take by mouth. (Patient not taking: Reported on 05/13/2021)   [DISCONTINUED] Cholecalciferol (VITAMIN D3 PO) Take 1,000 Units by mouth daily.  (Patient not taking: Reported on 05/13/2021)   [DISCONTINUED] Vitamin D, Cholecalciferol, 10 MCG (400 UNIT) TABS Take by mouth.   No facility-administered medications prior to visit.    Allergies  Allergen Reactions   Chlorthalidone Other (See Comments)    D/c by dr. Juleen China for worsening renal function 03/2019   Spironolactone Other (See Comments)    gynecomastia    Patient Care Team: [name], MD as PCP - General (Family Medicine) [name], MD  as Consulting Physician (Nephrology) Ubaldo Glassing Javier Docker, MD as Consulting Physician (Cardiology) [name], MD as Referring Physician (Dermatology) [name], MD (Urology) [name], [facility] (Pharmacist) Annamaria Helling, DO as Consulting Physician (Gastroenterology)  Review of Systems  Constitutional:  Positive for fatigue. Negative for appetite change, chills and fever.  HENT:  Negative for congestion, ear pain, hearing loss, nosebleeds and trouble swallowing.   Eyes:  Negative for pain and visual disturbance.  Respiratory:  Positive for cough (dry cough) and shortness of breath. Negative for chest tightness and wheezing.   Cardiovascular:  Negative for chest pain, palpitations and leg swelling.  Gastrointestinal:  Positive for rectal pain (recently while on antibiotic treatment). Negative for abdominal pain, blood in stool, constipation, diarrhea, nausea and vomiting.  Endocrine: Positive for cold intolerance. Negative for polydipsia, polyphagia and polyuria.  Genitourinary:  Negative for dysuria and flank pain.  Musculoskeletal:  Negative for arthralgias, back pain, joint swelling, myalgias and neck stiffness.  Skin:  Negative for color change, rash and wound.  Neurological:  Negative for dizziness, tremors, seizures, speech difficulty, weakness, light-headedness and headaches.  Psychiatric/Behavioral:  Negative for behavioral problems, confusion, decreased concentration, dysphoric mood and sleep disturbance. The patient is not nervous/anxious.   All other systems reviewed and are negative.      Objective    Vitals: BP (!) 139/57 (BP Location: Left Arm, Patient Position: Sitting, Cuff Size: Large)    Pulse (!) 56    Temp 98.1 F (36.7 C) (Oral)    Resp 20    Ht 5\' 8"  (1.727 m)    Wt 245 lb (111.1 kg)    SpO2 98% Comment: room air   BMI 37.25 kg/m     General Appearance:    Mildly obese male. Alert, cooperative, in no acute distress, appears stated age  Head:     Normocephalic, without obvious abnormality, atraumatic  Eyes:    PERRL, conjunctiva/corneas clear, EOM's intact, fundi    benign, both eyes       Ears:    Normal TM's and external ear canals, both ears  Nose:   Nares normal, septum midline, mucosa normal, no drainage   or sinus tenderness  Back:     Symmetric, no curvature, ROM normal, no CVA tenderness  Lungs:     Clear to auscultation bilaterally, respirations unlabored  Heart:    Bradycardic. Normal rhythm. No murmurs, rubs, or gallops.  S1 and S2  normal  Abdomen:     Soft, non-tender, bowel sounds active all four quadrants,    no masses, no organomegaly  Genitalia:    deferred  Rectal:    deferred  Extremities:   All extremities are intact. No cyanosis or edema  Pulses:   2+ and symmetric all extremities  Skin:   Skin color, texture, turgor normal, no rashes or lesions  Neurologic:   CNII-XII intact. Normal strength, sensation and reflexes      throughout       Results for orders placed or performed in visit on 05/13/21  POCT HgB A1C  Result Value Ref Range   Hemoglobin A1C 5.4 4.0 - 5.6 %   Est. average glucose Bld gHb Est-mCnc 108     Assessment & Plan      1. Annual physical exam   2. Type 2 diabetes mellitus with stage 3b chronic kidney disease, with long-term current use of insulin ([place]) Very well controlled, doing well with dulaglutide. He just reduced Basaglar dose this week at the recommendation of our pharmacist. Will follow up in about 4 months to check A1c.   3. Stage 3b chronic kidney disease ([place]) Stable. Follow up with nephrology as scheduled.      The entirety of the information documented in the History of Present Illness, Review of Systems and Physical Exam were personally obtained by me. Portions of this information were initially documented by the CMA and reviewed by me for thoroughness and accuracy.     Lelon Huh, MD  [facility] [phone] (phone) [phone] (fax)  [place]

## 2021-05-16 ENCOUNTER — Telehealth: Payer: Self-pay

## 2021-05-16 NOTE — Progress Notes (Signed)
Chronic Care Management Pharmacy Assistant   Name: Jacob Herrera  MRN: [id number] DOB: May 07, 1950  Reason for Encounter: Medication Review/Patient Assistance for Humalog.   Recent office visits:  05/13/2021 Dr. Caryn Section MD (PCP) No medication Changes noted.  Recent consult visits:  No recent Consult Visits.   Hospital visits:  None in previous 6 months  Medications: Outpatient Encounter Medications as of 05/16/2021  Medication Sig Note   albuterol (PROVENTIL) (2.5 MG/3ML) 0.083% nebulizer solution Take 3 mLs (2.5 mg total) by nebulization every 4 (four) hours. And PRN (Patient taking differently: Take 2.5 mg by nebulization as needed.) 05/11/2020: PRN, hasnt had to use in 2-3 months   amLODipine (NORVASC) 10 MG tablet Take 1 tablet (10 mg total) by mouth daily.    aspirin 81 MG tablet Take 81 mg by mouth daily.    atorvastatin (LIPITOR) 80 MG tablet TAKE ONE TABLET AT BEDTIME    calcitRIOL (ROCALTROL) 0.25 MCG capsule Take 0.25 mcg by mouth daily.    carvedilol (COREG) 25 MG tablet TAKE 1 TABLET BY MOUTH TWICE DAILY    cloNIDine (CATAPRES) 0.2 MG tablet TAKE 1 TABLET BY MOUTH TWICE DAILY    clopidogrel (PLAVIX) 75 MG tablet Take 1 tablet (75 mg total) by mouth daily.    Continuous Blood Gluc Receiver (FREESTYLE LIBRE 2 READER) DEVI Use to monitor blood sugars as directed    Continuous Blood Gluc Sensor (FREESTYLE LIBRE 2 SENSOR) MISC Place 1 sensor on the skin every 14 days to monitor blood sugars continuously    Cyanocobalamin (VITAMIN B 12 PO) Take 1,000 mcg by mouth daily.     doxazosin (CARDURA) 8 MG tablet TAKE ONE TABLET BY MOUTH EVERY DAY -STOP FLOMAX-    Dulaglutide (TRULICITY) 1.5 JZ/7.9XT SOPN Inject 1.5 mg into the skin once a week. [place] patient assistance through Dec 2023    erythromycin ophthalmic ointment Apply 1 a small amount into right eye every night    finasteride (PROSCAR) 5 MG tablet Take 1 tablet (5 mg total) by mouth daily.    fluticasone  (FLONASE) 50 MCG/ACT nasal spray USE TWO SPRAYS IN EACH NOSTRIL DAILY    furosemide (LASIX) 40 MG tablet Take 40 mg by mouth 2 (two) times daily.     gabapentin (NEURONTIN) 600 MG tablet TAKE 1 TABLET BY MOUTH 2 TIMES DAILY    hydrALAZINE (APRESOLINE) 100 MG tablet TAKE 1 TABLET BY MOUTH 3 TIMES DAILY    Insulin Glargine (BASAGLAR KWIKPEN) 100 UNIT/ML Inject 25 Units into the skin daily. [place] patient assistance through Dec 2023    insulin lispro (HUMALOG KWIKPEN) 100 UNIT/ML KwikPen Inject 6-10 Units into the skin 3 (three) times daily. Sliding scale. [place] patient assistance through Dec 2023    Insulin Pen Needle (BD PEN NEEDLE NANO U/F) 32G X 4 MM MISC USE DAILY AS DIRECTED    INVOKANA 100 MG TABS tablet Take 100 mg by mouth daily.    isosorbide mononitrate (IMDUR) 60 MG 24 hr tablet TAKE 1 TABLET BY MOUTH DAILY    levothyroxine (SYNTHROID) 125 MCG tablet TAKE 1 TABLET EVERY DAY ON EMPTY STOMACHWITH A GLASS OF WATER AT LEAST 30-60 MINBEFORE BREAKFAST    losartan (COZAAR) 100 MG tablet Take 1 tablet (100 mg total) by mouth daily.    Lysine 500 MG CAPS Take 1 capsule by mouth daily.    ONETOUCH ULTRA test strip USE AS DIRECTED THREE TIMES A DAY    oxyCODONE (OXY IR/ROXICODONE)  5 MG immediate release tablet Take 1 tablet (5 mg total) by mouth daily as needed for severe pain. (Patient not taking: Reported on 05/13/2021)    pantoprazole (PROTONIX) 40 MG tablet Take 1 tablet (40 mg total) by mouth daily before breakfast.    senna (SENOKOT) 8.6 MG tablet Take by mouth. (Patient not taking: Reported on 05/13/2021)    SENNA PO Take 1 capsule by mouth in the morning, at noon, in the evening, and at bedtime.    SYMBICORT 80-4.5 MCG/ACT inhaler INHALE 2 PUFFS INTO LUNGS TWICE DAILY    No facility-administered encounter medications on file as of 05/16/2021.    Care Gaps: Tetanus Vaccine (Last Completed 07/07/2008) COVID-19 Vaccine (4- Booster for [employer] series)   Star Rating  Drugs: Atorvastatin 80 mg last filled on 03/26/2021 for 90 day supply at [facility]. Losartan 100 mg last filled on 02/04/2021 [facility]. Invokana 100 mg last filled on 05/06/2021 for 30 day supply at [place] Trulicity 1.5 mg  last filled on 06/21/2020 [facility] (Patient assistance through [employer]) Medications Fill Gaps: None  Per Clinical pharmacist,  please reach out to [facility] to ask them to take Humalog off of automatic refills due to patient supply. Ok to keep [place] on automatic refills.  I reach out to [facility] to request the change after being on hold for 2 hours and 5 minutes. Per [employer],  they have to transfer me to a pharmacy to request stopping the automatic refills for Humalog.Per Pharmacy, patient would need to call and request the automatic refills to be stop.Notified Clinical pharmacist.  Per Clinical pharmacist, inform patient patient and give him the contact information to [facility] pharmacy [phone].  Informed patient of the above information  Patient verbalized understanding, and states he will reach out to the pharmacy to request stopping the automatic refills.   [place] Pharmacist Assistant [phone]

## 2021-05-17 DIAGNOSIS — N1832 Chronic kidney disease, stage 3b: Secondary | ICD-10-CM | POA: Diagnosis not present

## 2021-05-17 DIAGNOSIS — R809 Proteinuria, unspecified: Secondary | ICD-10-CM | POA: Diagnosis not present

## 2021-05-17 DIAGNOSIS — N041 Nephrotic syndrome with focal and segmental glomerular lesions: Secondary | ICD-10-CM | POA: Diagnosis not present

## 2021-05-17 DIAGNOSIS — I129 Hypertensive chronic kidney disease with stage 1 through stage 4 chronic kidney disease, or unspecified chronic kidney disease: Secondary | ICD-10-CM | POA: Diagnosis not present

## 2021-05-17 DIAGNOSIS — N2581 Secondary hyperparathyroidism of renal origin: Secondary | ICD-10-CM | POA: Diagnosis not present

## 2021-05-24 DIAGNOSIS — N1832 Chronic kidney disease, stage 3b: Secondary | ICD-10-CM | POA: Diagnosis not present

## 2021-05-24 DIAGNOSIS — E1121 Type 2 diabetes mellitus with diabetic nephropathy: Secondary | ICD-10-CM

## 2021-05-24 DIAGNOSIS — E785 Hyperlipidemia, unspecified: Secondary | ICD-10-CM

## 2021-05-24 DIAGNOSIS — R809 Proteinuria, unspecified: Secondary | ICD-10-CM | POA: Diagnosis not present

## 2021-05-24 DIAGNOSIS — Z7985 Long-term (current) use of injectable non-insulin antidiabetic drugs: Secondary | ICD-10-CM | POA: Diagnosis not present

## 2021-05-24 DIAGNOSIS — I129 Hypertensive chronic kidney disease with stage 1 through stage 4 chronic kidney disease, or unspecified chronic kidney disease: Secondary | ICD-10-CM | POA: Diagnosis not present

## 2021-05-24 DIAGNOSIS — N041 Nephrotic syndrome with focal and segmental glomerular lesions: Secondary | ICD-10-CM | POA: Diagnosis not present

## 2021-05-24 DIAGNOSIS — N2581 Secondary hyperparathyroidism of renal origin: Secondary | ICD-10-CM | POA: Diagnosis not present

## 2021-05-24 NOTE — Patient Instructions (Signed)
Visit Information It was great speaking with you today!  Please let me know if you have any questions about our visit.   Goals Addressed             This Visit's Progress    Monitor and Manage My Blood Sugar-Diabetes Type 2   On track    Timeframe:  Long-Range Goal Priority:  High Start Date:  07/13/20                           Expected End Date: 01/13/22                      Follow Up within 90 days    -check blood sugar at least 5 times daily - check blood sugar if I feel it is too high or too low - enter blood sugar readings and medication or insulin into daily log - take the blood sugar log to all doctor visits    Why is this important?   Checking your blood sugar at home helps to keep it from getting very high or very low.  Writing the results in a diary or log helps the doctor know how to care for you.  Your blood sugar log should have the time, date and the results.  Also, write down the amount of insulin or other medicine that you take.  Other information, like what you ate, exercise done and how you were feeling, will also be helpful.     Notes:         Patient Care Plan: General Pharmacy (Adult)     Problem Identified: Hypertension, Hyperlipidemia, Diabetes, Heart Failure, Asthma, Chronic Kidney Disease, Hypothyroidism, Depression, BPH and Insomnia   Priority: High     Long-Range Goal: Patient-Specific Goal   Start Date: 07/13/2020  Expected End Date: 01/04/2022  This Visit's Progress: On track  Recent Progress: On track  Priority: High  Note:   Current Barriers:  Unable to independently afford treatment regimen  Pharmacist Clinical Goal(s):  Patient will verbalize ability to afford treatment regimen through collaboration with PharmD and provider.   Interventions: 1:1 collaboration with [name], MD regarding development and update of comprehensive plan of care as evidenced by provider attestation and co-signature Inter-disciplinary care team  collaboration (see longitudinal plan of care) Comprehensive medication review performed; medication list updated in electronic medical record  Hyperlipidemia: (LDL goal < 70) -Controlled -History of CAD s/p PCI  -Current treatment: Atorvastatin 80 mg daily: Appropriate, Effective, Query Safe, Accessible   -Medications previously tried: NA  -Educated on Importance of limiting foods high in cholesterol; -Recommended to continue current medication  Diabetes (A1c goal <8%) -Controlled -Current medications: Trulicity 1.5 mg weekly (fridays): Appropriate, Effective, Safe, Accessible  Humalog sliding scale (4-8 units typically) -adjusts based on carb intake: Appropriate, Effective, Safe, Accessible   Basaglar 30 units daily: Appropriate, Effective, Query Safe, Accessible   Invokana 100 mg daily: Appropriate, Effective, Safe, Accessible   -Medications previously tried: Bydureon, Glipizide, Victoza, metformin, Ozempic, Actos, Januvia  -Patient started on [place] 2 system, consented to sharing data with clinical pharmacist.  -Current home glucose readings  Target Date(s)  Number of days worn ? 14 days 1/3-1/16  % of time active ? 70% 92%  Mean Glucose (mg/dL)  104   GMI (2-week A1c estimate)  5.8%  Glycemic Variability (%CV) ?36% 21.3%  Time above >250 mg/dL <10% 0%  Time above 70-180 mg/dL <50%  0%  Time in range: 70-180 mg/dL >50% 96%  Time below 70 mg/dL <1% 4%  -Denies hypoglycemic/hyperglycemic symptoms -Current exercise: Sedentary. Patient is limited by sciatica, neuropathy pain.  -DECREASE Basaglar to 25 units daily.  Heart Failure (Goal: manage symptoms and prevent exacerbations) -Controlled -Last ejection fraction: 50% -HF type: Diastolic -NYHA Class: II (slight limitation of activity) -AHA HF Stage: C (Heart disease and symptoms present) -Current treatment: Amlodipine 10 mg daily  Carvedilol 25 mg twice daily  Clonidine 0.3 mg twice daily  Doxazosin 8 mg daily   Furosemide 40 mg twice daily  Hydralazine 100 mg three times daily  Imdur 60 mg daily  Losartan 100 mg nightly  -Medications previously tried: Chlorthalidone, spironolactone, nebivolol   -Current home BP/HR readings: NA, does not monitor at home. -Recommended to continue current medication  Hypothyroidism (Goal: Maintain stable thyroid function) -Controlled -Current treatment  Levothyroxine 125 mcg daily before breakfast  -Medications previously tried: NA  -Recommended to continue current medication  Chronic Kidney Disease Stage 3b  -All medications assessed for renal dosing and appropriateness in chronic kidney disease. -Recommended to continue current medication  Patient Goals/Self-Care Activities Patient will:  - check glucose 3-4 times daily: before meals and at bedtime, document, and provide at future appointments check blood pressure weekly, document, and provide at future appointments  Follow Up Plan:Telephone follow up appointment with care management team member scheduled for:  08/15/2021 at 3:00 PM    Patient agreed to services and verbal consent obtained.   Patient verbalizes understanding of instructions and care plan provided today and agrees to view in [place]. Active MyChart status confirmed with patient.    [name], PharmD, [place], CPP  Clinical Pharmacist Practitioner  [facility] [phone]

## 2021-05-31 ENCOUNTER — Telehealth: Payer: Self-pay

## 2021-05-31 NOTE — Telephone Encounter (Signed)
Copied from [place] [id number]. Topic: General - Inquiry >> May 31, 2021  4:38 PM Oneta Rack wrote: Patient received a letter from [facility] for a application for insulin and would like to discuss further because Cristie Hem has been taking  care of the application process through [place]. Patient states he has never received a letter before and unsure if anything has changed. Requesting a call back from [facility]

## 2021-06-01 ENCOUNTER — Telehealth: Payer: Self-pay

## 2021-06-01 NOTE — Telephone Encounter (Signed)
Please check in with Jacob Herrera when you have a moment.

## 2021-06-01 NOTE — Progress Notes (Signed)
Patient states he received a letter from [employer] stating he need to complete renewal application for Trulicity, Engineer, agricultural and Humalog to continue assistant through year 2024.Informed patient he was approved for year 2023, and it may be a error, but I will contact them just to make sure.Patient states he wonders if this is because he reach out to them last month requesting Humalog not to be automatic refilled as he has abundance supply on hand.  I reach out to [facility] to address the above.Per [employer], patient can disregard the letter as it was from October 2022, and there mail is now just being send out.Patient is approved to continue to  received assistance for Trulicity, Basaglar and Humalog through 2023.[employer] confirm there is a not to take Humalog off of automatic refills.  Patient Verbalized understanding.  [place] Pharmacist Assistant [phone]

## 2021-06-10 ENCOUNTER — Other Ambulatory Visit: Payer: Self-pay | Admitting: Family Medicine

## 2021-06-15 ENCOUNTER — Other Ambulatory Visit: Payer: Self-pay | Admitting: Family Medicine

## 2021-06-15 DIAGNOSIS — E1121 Type 2 diabetes mellitus with diabetic nephropathy: Secondary | ICD-10-CM

## 2021-06-16 ENCOUNTER — Telehealth: Payer: Self-pay

## 2021-06-16 DIAGNOSIS — E1121 Type 2 diabetes mellitus with diabetic nephropathy: Secondary | ICD-10-CM

## 2021-06-16 DIAGNOSIS — I5032 Chronic diastolic (congestive) heart failure: Secondary | ICD-10-CM | POA: Diagnosis not present

## 2021-06-16 DIAGNOSIS — J9611 Chronic respiratory failure with hypoxia: Secondary | ICD-10-CM | POA: Diagnosis not present

## 2021-06-16 DIAGNOSIS — I25118 Atherosclerotic heart disease of native coronary artery with other forms of angina pectoris: Secondary | ICD-10-CM | POA: Diagnosis not present

## 2021-06-16 DIAGNOSIS — I10 Essential (primary) hypertension: Secondary | ICD-10-CM | POA: Diagnosis not present

## 2021-06-16 MED ORDER — FREESTYLE LIBRE 2 SENSOR MISC: 3 refills | Status: DC

## 2021-06-16 NOTE — Progress Notes (Signed)
Patient states he needs a refill for his Freestyle Libre sensor, and inform [facility] who was confuse if he needed the sensor or monitor.Patient states he believes he needs the sensor since he uses his phone as a  monitor.I inform the patient that his PCP did send over a prescription yesterday for [facility], but I can request a refill for his sensor as well.Notified clinical pharmacist. Patient Verbalized understanding and confirm [facility] is [facility].  [place] Pharmacist Assistant [phone]

## 2021-06-16 NOTE — Addendum Note (Signed)
Addended by: Daron Offer A on: 06/16/2021 04:48 PM   Modules accepted: Orders

## 2021-06-30 DIAGNOSIS — Z86010 Personal history of colonic polyps: Secondary | ICD-10-CM | POA: Diagnosis not present

## 2021-06-30 DIAGNOSIS — K5900 Constipation, unspecified: Secondary | ICD-10-CM | POA: Diagnosis not present

## 2021-06-30 DIAGNOSIS — R140 Abdominal distension (gaseous): Secondary | ICD-10-CM | POA: Diagnosis not present

## 2021-07-05 DIAGNOSIS — J45909 Unspecified asthma, uncomplicated: Secondary | ICD-10-CM | POA: Diagnosis not present

## 2021-07-05 DIAGNOSIS — G4733 Obstructive sleep apnea (adult) (pediatric): Secondary | ICD-10-CM | POA: Diagnosis not present

## 2021-07-08 ENCOUNTER — Other Ambulatory Visit: Payer: Self-pay | Admitting: Family Medicine

## 2021-07-08 NOTE — Telephone Encounter (Signed)
Requested Prescriptions  ?Pending Prescriptions Disp Refills  ?? levothyroxine (SYNTHROID) 125 MCG tablet [Pharmacy Med Name: LEVOTHYROXINE SODIUM 125 MCG TAB] 90 tablet 1  ?  Sig: TAKE 1 TABLET EVERY DAY ON EMPTY STOMACHWITH A GLASS OF WATER AT LEAST 30-60 MINBEFORE BREAKFAST  ?  ? Endocrinology:  Hypothyroid Agents Passed - 07/08/2021  4:50 PM  ?  ?  Passed - TSH in normal range and within 360 days  ?  TSH  ?Date Value Ref Range Status  ?11/12/2020 3.380 0.450 - 4.500 uIU/mL Final  ?   ?  ?  Passed - Valid encounter within last 12 months  ?  Recent Outpatient Visits   ?      ? 1 month ago Annual physical exam  ? [facility] [name], MD  ? 2 months ago Upper respiratory tract infection, unspecified type  ? [facility] [name], MD  ? 7 months ago Diabetes mellitus with nephropathy [facility])  ? [facility] [name], MD  ? 12 months ago Diabetes mellitus with nephropathy [facility])  ? [facility] [name], MD  ? 1 year ago Diabetes mellitus with nephropathy [facility])  ? [facility] Caryn Section, [name], MD  ?  ?  ?Future Appointments   ?        ? In 1 month [name], [name], MD [facility]  ? In 4 months Stoioff, Ronda Fairly, MD [place]  ?  ? ?  ?  ?  ? ? ?

## 2021-07-12 ENCOUNTER — Telehealth: Payer: Self-pay

## 2021-07-12 NOTE — Progress Notes (Signed)
? ? ?Chronic Care Management ?Pharmacy Assistant  ? ?Name: Jacob Herrera  MRN: [id number] DOB: [birth year] ? ?Reason for Encounter:Diabetes Disease State Call. ? ?Recent office visits:  ?No recent office visit ? ?Recent consult visits:  ?06/30/2021 Laurine Blazer PA (Gastroenterology) Try Phazyme as needed  ?06/16/2021 Dr. Corky Sox MD (Cardiology) Stop Plavix ?05/24/2021 Dr. Juleen China MD (Nephrology) Reduce furosemide to '40mg'$  daily ? ?Hospital visits:  ?None in previous 6 months ? ?Medications: ?Outpatient Encounter Medications as of 07/12/2021  ?Medication Sig Note  ? albuterol (PROVENTIL) (2.5 MG/3ML) 0.083% nebulizer solution Take 3 mLs (2.5 mg total) by nebulization every 4 (four) hours. And PRN (Patient taking differently: Take 2.5 mg by nebulization as needed.) 05/11/2020: PRN, hasnt had to use in 2-3 months  ? amLODipine (NORVASC) 10 MG tablet Take 1 tablet (10 mg total) by mouth daily.   ? aspirin 81 MG tablet Take 81 mg by mouth daily.   ? atorvastatin (LIPITOR) 80 MG tablet TAKE ONE TABLET AT BEDTIME   ? calcitRIOL (ROCALTROL) 0.25 MCG capsule Take 0.25 mcg by mouth daily.   ? carvedilol (COREG) 25 MG tablet TAKE 1 TABLET BY MOUTH TWICE DAILY   ? cloNIDine (CATAPRES) 0.2 MG tablet TAKE 1 TABLET BY MOUTH TWICE DAILY   ? clopidogrel (PLAVIX) 75 MG tablet Take 1 tablet (75 mg total) by mouth daily.   ? Continuous Blood Gluc Receiver (FREESTYLE LIBRE 2 READER) DEVI USE TO MONITOR BLOOD SUGARS AS DIRECTED   ? Continuous Blood Gluc Sensor (FREESTYLE LIBRE 2 SENSOR) MISC Place 1 sensor on the skin every 14 days to monitor blood sugars continuously   ? Cyanocobalamin (VITAMIN B 12 PO) Take 1,000 mcg by mouth daily.    ? doxazosin (CARDURA) 8 MG tablet TAKE ONE TABLET BY MOUTH EVERY DAY -STOP FLOMAX-   ? Dulaglutide (TRULICITY) 1.5 DG/3.8VF SOPN Inject 1.5 mg into the skin once a week. Via [employer] patient assistance through Dec 2023   ? erythromycin ophthalmic ointment Apply 1 a small amount into right eye every  night   ? finasteride (PROSCAR) 5 MG tablet Take 1 tablet (5 mg total) by mouth daily.   ? fluticasone (FLONASE) 50 MCG/ACT nasal spray USE TWO SPRAYS IN EACH NOSTRIL DAILY   ? furosemide (LASIX) 40 MG tablet Take 40 mg by mouth 2 (two) times daily.    ? gabapentin (NEURONTIN) 600 MG tablet TAKE 1 TABLET BY MOUTH 2 TIMES DAILY   ? hydrALAZINE (APRESOLINE) 100 MG tablet TAKE 1 TABLET BY MOUTH 3 TIMES DAILY   ? Insulin Glargine (BASAGLAR KWIKPEN) 100 UNIT/ML Inject 25 Units into the skin daily. [employer] patient assistance through Dec 2023   ? insulin lispro (HUMALOG KWIKPEN) 100 UNIT/ML KwikPen Inject 6-10 Units into the skin 3 (three) times daily. Sliding scale. [employer] patient assistance through Dec 2023   ? Insulin Pen Needle (BD PEN NEEDLE NANO U/F) 32G X 4 MM MISC USE DAILY AS DIRECTED   ? INVOKANA 100 MG TABS tablet Take 100 mg by mouth daily.   ? isosorbide mononitrate (IMDUR) 60 MG 24 hr tablet TAKE 1 TABLET BY MOUTH DAILY   ? levothyroxine (SYNTHROID) 125 MCG tablet TAKE 1 TABLET EVERY DAY ON EMPTY STOMACHWITH A GLASS OF WATER AT LEAST 30-60 MINBEFORE BREAKFAST   ? losartan (COZAAR) 100 MG tablet Take 1 tablet (100 mg total) by mouth daily.   ? Lysine 500 MG CAPS Take 1 capsule by mouth daily.   ? ONETOUCH ULTRA test  strip USE AS DIRECTED THREE TIMES A DAY   ? oxyCODONE (OXY IR/ROXICODONE) 5 MG immediate release tablet Take 1 tablet (5 mg total) by mouth daily as needed for severe pain. (Patient not taking: Reported on 05/13/2021)   ? pantoprazole (PROTONIX) 40 MG tablet Take 1 tablet (40 mg total) by mouth daily before breakfast.   ? senna (SENOKOT) 8.6 MG tablet Take by mouth. (Patient not taking: Reported on 05/13/2021)   ? SENNA PO Take 1 capsule by mouth in the morning, at noon, in the evening, and at bedtime.   ? SYMBICORT 80-4.5 MCG/ACT inhaler INHALE 2 PUFFS INTO LUNGS TWICE DAILY   ? ?No facility-administered encounter medications on file as of 07/12/2021.  ? ? ?Care Gaps: ?Tetanus  Vaccine (Last Completed 07/07/2008) ?COVID-19 Vaccine (4- Booster for [employer] series) ?  ?Star Rating Drugs: ?Atorvastatin 80 mg last filled on 06/24/2021 for 90 day supply at [place]. ?Losartan 100 mg last filled on 05/06/2021 [facility]. ?Invokana 100 mg last filled on 05/06/2021 for 30 day supply at [place] ?Trulicity 1.5 mg  last filled on 06/21/2020 [facility] (Patient assistance through [facility]) ? ?Medications Fill Gaps: ?None ? ?Recent Relevant Labs: ?Lab Results  ?Component Value Date/Time  ? HGBA1C 5.4 05/13/2021 03:23 PM  ? HGBA1C 6.2 (H) 11/12/2020 02:14 PM  ? HGBA1C 6.3 (A) 11/12/2020 01:50 PM  ? HGBA1C 6.3 (H) 03/20/2017 03:03 PM  ? MICROALBUR neg 03/20/2017 02:25 PM  ?  ?Kidney Function ?Lab Results  ?Component Value Date/Time  ? CREATININE 2.1 (A) 11/08/2020 12:00 AM  ? CREATININE 1.85 (H) 10/07/2018 08:25 AM  ? CREATININE 1.53 (H) 12/21/2016 03:27 PM  ? CREATININE 1.36 (H) 07/08/2013 05:55 AM  ? CREATININE 1.44 (H) 07/04/2013 12:26 PM  ? GFRNONAA 33 11/08/2020 12:00 AM  ? GFRNONAA 54 (L) 07/08/2013 05:55 AM  ? GFRAA 42 (L) 10/07/2018 08:25 AM  ? GFRAA >60 07/08/2013 05:55 AM  ? ? ?Current antihyperglycemic regimen:  ?Trulicity 1.5 mg weekly (fridays) ?Humalog sliding scale (6-10 units ) ?Basaglar 25 units daily ?Invokana 100 mg daily ? ?What recent interventions/DTPs have been made to improve glycemic control:  ?None ID ? ?Have there been any recent hospitalizations or ED visits since last visit with CPP? No ? ?Patient reports hypoglycemic symptoms, including Sweaty and Shaky ? Patient states he will get symptoms of feeling sweaty, but not being sweaty then he will have chills.Patient states this happens once a month. ? ?Patient reports hyperglycemic symptoms, including fatigue and weakness ? Patient reports he has always felt tried because he is unable to exercise due to his back pain. ? ?How often are you checking your  blood sugar?  ?Patient has [employer] deceive, and states he will try to remember to upload his data for the clinical pharmacist to review.  ? ?What are your blood sugars ranging?  ?Patient states his blood sugar is ranging from 90-110 depending on what he eats. ? ?During the week, how often does your blood glucose drop below 70? Never ? ?Are you checking your feet daily/regularly?  ? Yes, patient reports he checks his feet daily. ? ?Adherence Review: ?Is the patient currently on a STATIN medication? Yes ?Is the patient currently on ACE/ARB medication? Yes ?Does the patient have >5 day gap between last estimated fill dates? No ? ?Patient states he has 2 pens left of Basaglar, and unsure if he needs to reach out to  the clinical pharmacist or the pharmacy to request a refill.Inform patient I would be happy to call [place] to see where his shipment is for Basaglar since it is on automatic refills.Patient states he will reach out to [place] [facility] and check on it and appreciate the offer.Patient states he will return my call to let me know  [facility] response. ? ?Anderson Malta ?Clinical Pharmacist Assistant ?[phone]  ? ? ?

## 2021-07-20 ENCOUNTER — Telehealth: Payer: Self-pay

## 2021-07-20 NOTE — Progress Notes (Signed)
Patient states he did reach out to [place] [facility] last week to check the shipment status for his [name], and they inform him he should received his shipment on 07/19/2021.Patient reports he still has not received his shipment, and would like to inform the clinical pharmacist.Inform patient patient I would reach out to [employer] to check the status of his shipment as it seems it may be a shipping delay.Patient confirm he he has some Basaglar on hand but prefers not to run out before he receives his shipment. ? ?I reach out to [employer] who transfer me to [place] to check his shipment status.On hold for 30 minutes and line was disconnected. I will reach out again on 07/22/2021. ? ?07/22/2021: ?I reach out to [employer] to check the shipment status of patient Basaglar. Per [employer], patient should receive his Basaglar shipment today 07/22/2021 around 12:00 pm. [name] provided tracking number through [employer] : [id number]. ? ?Patient states he just received his shipment about a few minutes ago, and express appreciation.  ? ?Anderson Malta ?Clinical Pharmacist Assistant ?[phone]  ? ?

## 2021-08-04 ENCOUNTER — Telehealth: Payer: Self-pay

## 2021-08-04 NOTE — Progress Notes (Signed)
Patient states he received a shipment from [facility] for Humalog, and unsure why because it is suppose to be on hold until his provider or him request the refill. Patient reports he has 5 boxes of Humalog on hand as of 08/04/2021,and 2 boxes of Basaglar on hand as of 08/04/2021.Patient reports he also received a shipment for Basaglar, but received  only two boxes instead of three.Patient states he is worried he may run out before he receives his next shipment. ? ?Per Clinical pharmacist, patient takes 25 units of Basaglar daily. ? ?I reach out to [facility] to confirm they had Humalog on Hold, and if they had the right Dose for Basaglar.Per [facility], they did not have Humalog off auto fill, but did have his Trulicity off of auto fill.[facility] is unsure why they had the wrong medication off auto fill as the right medication was in the note when I spoke to them a few weeks ago.[facility] confirm Humalog is off of auto fill, and the patient would need to reach out to them when he needs a refill.[facility] confirm Trulicity and [name] is on autofill.Per [facility], in the past the patient was on Basaglar 35 units daily, and that is why he received 3 boxes.Since his dose was change to 25 units he would receive 2 boxes instead.If patient runs low before there shipment he can reach out to them and they will change his delivery date to avoid him running out of medication. Patient should receive a shipment for Trulicity within 2 weeks, and another shipment for [place] in the middle of May. ? ?Patient Verbalized understanding, and states he should have enough of Basaglar until the middle of may.Patient is aware he can return my call if he runs low before his shipment as I will reach out to [facility] to request his delivery sooner. ? ?Anderson Malta ?Clinical Pharmacist Assistant ?[phone]  ? ?

## 2021-08-12 ENCOUNTER — Telehealth: Payer: Self-pay

## 2021-08-12 NOTE — Progress Notes (Signed)
Chronic Care Management APPOINTMENT REMINDER ? ?Jacob Herrera was reminded to have all medications, supplements and any blood glucose and blood pressure readings available for review with [name], Pharm. D, at his telephone visit on 08/15/2021 at 3:00 pm. ? ?Patient confirm appointment. ? ?Anderson Malta ?Clinical Pharmacist Assistant ?[phone]  ? ? ?

## 2021-08-15 ENCOUNTER — Ambulatory Visit (INDEPENDENT_AMBULATORY_CARE_PROVIDER_SITE_OTHER): Payer: PPO

## 2021-08-15 DIAGNOSIS — E1121 Type 2 diabetes mellitus with diabetic nephropathy: Secondary | ICD-10-CM

## 2021-08-15 DIAGNOSIS — J4520 Mild intermittent asthma, uncomplicated: Secondary | ICD-10-CM

## 2021-08-15 NOTE — Progress Notes (Signed)
? ?Chronic Care Management ?Pharmacy Note ? ?08/17/2021 ?Name:  Jacob Herrera MRN:  [id number] DOB:  02-11-1951 ? ?Summary: ?Patient presents for CCM follow-up.   ?-Reports hypoglycemic symptoms. Four instances of hypoglycemia over past 2 weeks. Jittery. Corrects with a snack (M&Ms, Nabs crackers).  ?-experiencing multiple instances of nocturnal hypoglycemia  ? ?Recommendations/Changes made from today's visit: ?-Counseled on proper treatement of hypogylcemia (RULE of 15, using fast acting carbohydrates) ?-DECREASE Basaglar to 25 units daily. ? ?Plan: ?CPP follow-up 3 months ? ?Subjective: ?Jacob Herrera is an 71 y.o. year old male who is a primary patient of [name], [name], MD.  The CCM team was consulted for assistance with disease management and care coordination needs.   ? ?Engaged with patient face to face for follow up visit in response to provider referral for pharmacy case management and/or care coordination services.  ? ?Consent to Services:  ?The patient was given information about Chronic Care Management services, agreed to services, and gave verbal consent prior to initiation of services.  Please see initial visit note for detailed documentation.  ? ?Patient Care Team: ?[name], MD as PCP - General (Family Medicine) ?[name], MD as Consulting Physician (Nephrology) ?Teodoro Spray, MD as Consulting Physician (Cardiology) ?[name], MD as Referring Physician (Dermatology) ?Stoioff, Ronda Fairly, MD (Urology) ?[name], [facility] (Pharmacist) ?Annamaria Helling, DO as Consulting Physician (Gastroenterology) ? ?Recent office visits: ?04/27/21: patient presented to Dr. Caryn Section for URI. Azithromycin.  ?11/12/20: patient presented to Dr. Caryn Section for follow-up. A1c 6.1%.   ? ?Recent consult visits: ?05/24/21: Patient presented to Dr. Juleen China (nephrology), decrease furosemide 40 mg daily.  ? ?Hospital visits: ?06/02/20: Patient hospitalized for cataract extraction.  ?05/11/20: Patient  hospitalized for cataract extraction.  ? ?Objective: ? ?Lab Results  ?Component Value Date  ? CREATININE 2.1 (A) 11/08/2020  ? BUN 34 (A) 11/08/2020  ? GFRNONAA 33 11/08/2020  ? GFRAA 42 (L) 10/07/2018  ? NA 139 11/08/2020  ? K 4.5 11/08/2020  ? CALCIUM 10.3 11/08/2020  ? CO2 22 11/08/2020  ? GLUCOSE 134 (H) 10/07/2018  ? ? ?Lab Results  ?Component Value Date/Time  ? HGBA1C 5.4 05/13/2021 03:23 PM  ? HGBA1C 6.2 (H) 11/12/2020 02:14 PM  ? HGBA1C 6.3 (A) 11/12/2020 01:50 PM  ? HGBA1C 6.3 (H) 03/20/2017 03:03 PM  ? MICROALBUR neg 03/20/2017 02:25 PM  ?  ?Last diabetic Eye exam:  ?Lab Results  ?Component Value Date/Time  ? HMDIABEYEEXA No Retinopathy 01/24/2021 12:00 AM  ?  ?Last diabetic Foot exam: No results found for: HMDIABFOOTEX  ? ?Lab Results  ?Component Value Date  ? CHOL 119 11/12/2020  ? HDL 33 (L) 11/12/2020  ? [place] 52 11/12/2020  ? TRIG 209 (H) 11/12/2020  ? CHOLHDL 3.6 11/12/2020  ? ? ? ?  Latest Ref Rng & Units 11/08/2020  ? 12:00 AM 10/07/2018  ?  8:25 AM 12/21/2016  ?  3:27 PM  ?Hepatic Function  ?Total Protein 6.0 - 8.5 g/dL  6.6   7.0    ?Albumin 3.5 - 5.0 3.9      4.2   4.3    ?AST 0 - 40 IU/L  23   19    ?ALT 0 - 44 IU/L  17   21    ?Alk Phosphatase 39 - 117 IU/L  47   68    ?Total Bilirubin 0.0 - 1.2 mg/dL  0.2   0.2    ?  ? This result is from  an external source.  ? ? ?Lab Results  ?Component Value Date/Time  ? TSH 3.380 11/12/2020 02:14 PM  ? TSH 2.720 05/26/2019 04:16 PM  ? FREET4 1.30 11/12/2020 02:14 PM  ? ? ? ?  Latest Ref Rng & Units 11/08/2020  ? 12:00 AM 11/09/2014  ? 10:30 AM 07/08/2013  ?  5:55 AM  ?CBC  ?WBC  6.1      6.6   8.0    ?Hemoglobin 13.5 - 17.5 10.7      12.4   13.5    ?Hematocrit 41 - 53 32      38.9   41.7    ?Platelets 150 - 399 249      241   220    ?  ? This result is from an external source.  ? ? ?Lab Results  ?Component Value Date/Time  ? VD25OH 22.8 (L) 05/26/2019 04:16 PM  ? ? ?Clinical ASCVD: No  ?The ASCVD Risk score (Arnett DK, et al., 2019) failed to calculate for the  following reasons: ?  The valid total cholesterol range is 130 to 320 mg/dL   ? ? ?  05/13/2021  ?  2:58 PM 11/12/2020  ?  1:45 PM 03/15/2020  ?  3:36 PM  ?Depression screen PHQ 2/9  ?Decreased Interest 0 0 0  ?Down, Depressed, Hopeless 0 0 0  ?PHQ - 2 Score 0 0 0  ?Altered sleeping 0 1 0  ?Tired, decreased energy 1 2 0  ?Change in appetite 0 0 0  ?Feeling bad or failure about yourself  0 0 0  ?Trouble concentrating 0 0 0  ?Moving slowly or fidgety/restless 0 0 0  ?Suicidal thoughts 0 0 0  ?PHQ-9 Score 1 3 0  ?Difficult doing work/chores Not difficult at all Not difficult at all Not difficult at all  ?  ?Social History  ? ?Tobacco Use  ?Smoking Status Former  ? Packs/day: 1.00  ? Years: 25.00  ? Pack years: 25.00  ? Types: Cigarettes  ? Quit date: 05/29/1988  ? Years since quitting: 33.2  ?Smokeless Tobacco Current  ? Types: Chew  ?Tobacco Comments  ? chews 1 bag of loose leaf chew/week  ? ?BP Readings from Last 3 Encounters:  ?05/13/21 (!) 139/57  ?04/04/21 127/75  ?01/12/21 140/64  ? ?Pulse Readings from Last 3 Encounters:  ?05/13/21 (!) 56  ?04/04/21 74  ?01/12/21 68  ? ?Wt Readings from Last 3 Encounters:  ?05/13/21 245 lb (111.1 kg)  ?04/04/21 237 lb (107.5 kg)  ?01/12/21 247 lb 11.2 oz (112.4 kg)  ? ?BMI Readings from Last 3 Encounters:  ?05/13/21 37.25 kg/m?  ?04/04/21 36.04 kg/m?  ?01/12/21 37.66 kg/m?  ? ? ?Assessment/Interventions: Review of patient past medical history, allergies, medications, health status, including review of consultants reports, laboratory and other test data, was performed as part of comprehensive evaluation and provision of chronic care management services.  ? ?SDOH:  (Social Determinants of Health) assessments and interventions performed: Yes ? ? ? ? ? ?[place] ? ?Allergies  ?Allergen Reactions  ? Chlorthalidone Other (See Comments)  ?  D/c by dr. Juleen China for worsening renal function 03/2019  ? Spironolactone Other (See Comments)  ?  gynecomastia  ? ? ?Medications Reviewed Today    ? ? Reviewed by [name], MD (Physician) on 05/13/21 at 1528  Med List Status: <None>  ? ?Medication Order Taking? Sig Documenting Provider Last Dose Status Informant  ?albuterol (PROVENTIL) (2.5 MG/3ML) 0.083% nebulizer solution [id number]  Yes Take 3 mLs (2.5 mg total) by nebulization every 4 (four) hours. And PRN  ?Patient taking differently: Take 2.5 mg by nebulization as needed.  ? Flora Lipps, MD Taking Active   ?         ?Med Note [facility], LINDSAY S   Tue May 11, 2020  8:29 AM) PRN, hasnt had to use in 2-3 months  ?amLODipine (NORVASC) 10 MG tablet [id number] Yes Take 1 tablet (10 mg total) by mouth daily. Teodoro Spray, MD Taking Active   ?aspirin 81 MG tablet [id number] Yes Take 81 mg by mouth daily. [provider] Taking Active   ?atorvastatin (LIPITOR) 80 MG tablet [id number] Yes TAKE ONE TABLET AT BEDTIME [name], MD Taking Active   ?calcitRIOL (ROCALTROL) 0.25 MCG capsule [id number] Yes Take 0.25 mcg by mouth daily. [provider] Taking Active   ?carvedilol (COREG) 25 MG tablet [id number] Yes TAKE 1 TABLET BY MOUTH TWICE DAILY [name], [name], MD Taking Active   ?cloNIDine (CATAPRES) 0.2 MG tablet [id number] Yes TAKE 1 TABLET BY MOUTH TWICE DAILY [name], [name], MD Taking Active   ?clopidogrel (PLAVIX) 75 MG tablet [id number] Yes Take 1 tablet (75 mg total) by mouth daily. Teodoro Spray, MD Taking Active   ?Continuous Blood Gluc Receiver (FREESTYLE LIBRE 2 READER) DEVI [id number] Yes Use to monitor blood sugars as directed [name], [name], MD Taking Active   ?Continuous Blood Gluc Sensor (FREESTYLE LIBRE 2 SENSOR) MISC [id number] Yes Place 1 sensor on the skin every 14 days to monitor blood sugars continuously [name], MD Taking Active   ?Cyanocobalamin (VITAMIN B 12 PO) [id number] Yes Take 1,000 mcg by mouth daily.  [provider] Taking Active Self  ?doxazosin (CARDURA) 8 MG tablet [id number] Yes TAKE ONE TABLET BY MOUTH EVERY DAY -STOP FLOMAX-  [name], MD Taking Active   ?Dulaglutide (TRULICITY) 1.5 TY/7.5PB SOPN [id number] Yes Inject 1.5 mg into the skin once a week. [place] patient assistance through Dec 2023 [name]

## 2021-08-17 NOTE — Patient Instructions (Signed)
Visit Information ?It was great speaking with you today!  Please let me know if you have any questions about our visit. ? ? Goals Addressed   ? ?  ?  ?  ?  ? This Visit's Progress  ?  Monitor and Manage My Blood Sugar-Diabetes Type 2   On track  ?  Timeframe:  Long-Range Goal ?Priority:  High ?Start Date:  07/13/20                           ?Expected End Date: 01/13/22                     ? ?Follow Up within 90 days  ?  ?-check blood sugar at least 5 times daily ?- check blood sugar if I feel it is too high or too low ?- enter blood sugar readings and medication or insulin into daily log ?- take the blood sugar log to all doctor visits  ?  ?Why is this important?   ?Checking your blood sugar at home helps to keep it from getting very high or very low.  ?Writing the results in a diary or log helps the doctor know how to care for you.  ?Your blood sugar log should have the time, date and the results.  ?Also, write down the amount of insulin or other medicine that you take.  ?Other information, like what you ate, exercise done and how you were feeling, will also be helpful.   ?  ?Notes:  ?  ? ?  ? ? ?Patient Care Plan: General Pharmacy (Adult)  ?  ? ?Problem Identified: Hypertension, Hyperlipidemia, Diabetes, Heart Failure, Asthma, Chronic Kidney Disease, Hypothyroidism, Depression, BPH and Insomnia   ?Priority: High  ?  ? ?Long-Range Goal: Patient-Specific Goal   ?Start Date: 07/13/2020  ?Expected End Date: 01/04/2022  ?This Visit's Progress: On track  ?Recent Progress: On track  ?Priority: High  ?Note:   ?Current Barriers:  ?Unable to independently afford treatment regimen ? ?Pharmacist Clinical Goal(s):  ?Patient will verbalize ability to afford treatment regimen through collaboration with PharmD and provider.  ? ?Interventions: ?1:1 collaboration with [name], MD regarding development and update of comprehensive plan of care as evidenced by provider attestation and co-signature ?Inter-disciplinary care team  collaboration (see longitudinal plan of care) ?Comprehensive medication review performed; medication list updated in electronic medical record ? ?Hyperlipidemia: (LDL goal < 70) ?-Controlled ?-History of CAD s/p PCI  ?-Current treatment: ?Atorvastatin 80 mg daily: Appropriate, Effective, Query Safe, Accessible   ?-Medications previously tried: NA  ?-Educated on Importance of limiting foods high in cholesterol; ?-Recommended to continue current medication ? ?Diabetes (A1c goal <8%) ?-Controlled ?-Current medications: ?Trulicity 1.5 mg weekly (fridays): Appropriate, Effective, Safe, Accessible  ?Humalog sliding scale (4-8 units typically) -adjusts based on carb intake: Appropriate, Effective, Safe, Accessible   ?Basaglar 25 units daily: Appropriate, Effective, Query Safe, Accessible   ?Invokana 100 mg daily: Appropriate, Effective, Safe, Accessible   ?-Medications previously tried: Bydureon, Glipizide, Victoza, metformin, Ozempic, Actos, Januvia  ?-Current home glucose readings ? Target Date(s)  ?Number of days worn ? 14 days 1/3-1/16 4/11-4/24  ?% of time active ? 70% 92% 89%  ?Mean Glucose (mg/dL)  104  111  ?GMI (2-week A1c estimate)  5.8% 6.0%  ?Glycemic Variability (%CV) ?36% 21.3% 21.3%  ?Time above >250 mg/dL <10% 0% 0%  ?Time above 70-180 mg/dL <50% 0% 0%  ?Time in range: 70-180 mg/dL >50%  96% 99%  ?Time below 70 mg/dL <1% 4% 1%  ?-Reports hypoglycemic symptoms. Four instances of hypoglycemia over past 2 weeks. Jittery. Corrects with a snack (M&Ms, Nabs crackers).  ?-Current dietary patterns:  ?T[place]apia + green beans  ?-Current exercise: Sedentary. Patient is limited by sciatica, neuropathy pain.  ?-Counseled on proper treatment of hypoglycemia (RULE of 15, using fast acting carbohydrates) ?-DECREASE Basaglar to 25 units daily. ? ?Heart Failure (Goal: manage symptoms and prevent exacerbations) ?-Controlled ?-Last ejection fraction: 50% ?-HF type: Diastolic ?-NYHA Class: II (slight limitation of activity) ?-AHA  HF Stage: C (Heart disease and symptoms present) ?-Current treatment: ?Amlodipine 10 mg daily  ?Carvedilol 25 mg twice daily  ?Clonidine 0.2 mg twice daily  ?Doxazosin 8 mg daily  ?Furosemide 40 mg twice daily  ?Hydralazine 100 mg three times daily  ?Imdur 60 mg daily  ?Losartan 100 mg nightly  ?-Medications previously tried: Chlorthalidone, spironolactone, nebivolol   ?-Current home BP/HR readings: NA, does not monitor at home. ?-Recommended to continue current medication ? ?Hypothyroidism (Goal: Maintain stable thyroid function) ?-Controlled ?-Current treatment  ?Levothyroxine 125 mcg daily before breakfast  ?-Medications previously tried: NA  ?-Recommended to continue current medication ? ?Asthma (Goal: control symptoms and prevent exacerbations) ?-Controlled ?-Current treatment  ?Symbicort 2 puffs twice daily as needed  ?-Medications previously tried: NA  ?-Exacerbations requiring treatment in last 6 months: None ?-Frequency of rescue inhaler use: Infrequent ?-Recommended to continue current medication ? ?Chronic Kidney Disease Stage 3b  ?-All medications assessed for renal dosing and appropriateness in chronic kidney disease. ?-Recommended to continue current medication ? ?Patient Goals/Self-Care Activities ?Patient will:  ?- check glucose 3-4 times daily: before meals and at bedtime, document, and provide at future appointments ?check blood pressure weekly, document, and provide at future appointments ? ?Follow Up Plan:Telephone follow up appointment with care management team member scheduled for:  11/15/2021 at 3:00 PM ?  ? ? ? ?Patient agreed to services and verbal consent obtained.  ? ?Patient verbalizes understanding of instructions and care plan provided today and agrees to view in [place]. Active MyChart status confirmed with patient.   ? ?[name], PharmD, BCACP, CPP  ?Clinical Pharmacist Practitioner  ?[place] ?[phone] ?  ?

## 2021-08-19 ENCOUNTER — Other Ambulatory Visit: Payer: Self-pay | Admitting: Family Medicine

## 2021-08-21 DIAGNOSIS — E039 Hypothyroidism, unspecified: Secondary | ICD-10-CM | POA: Diagnosis not present

## 2021-08-21 DIAGNOSIS — Z7984 Long term (current) use of oral hypoglycemic drugs: Secondary | ICD-10-CM

## 2021-08-21 DIAGNOSIS — I5030 Unspecified diastolic (congestive) heart failure: Secondary | ICD-10-CM | POA: Diagnosis not present

## 2021-08-21 DIAGNOSIS — N1832 Chronic kidney disease, stage 3b: Secondary | ICD-10-CM | POA: Diagnosis not present

## 2021-08-21 DIAGNOSIS — Z794 Long term (current) use of insulin: Secondary | ICD-10-CM

## 2021-08-21 DIAGNOSIS — J45909 Unspecified asthma, uncomplicated: Secondary | ICD-10-CM

## 2021-08-21 DIAGNOSIS — E785 Hyperlipidemia, unspecified: Secondary | ICD-10-CM | POA: Diagnosis not present

## 2021-08-21 DIAGNOSIS — E1122 Type 2 diabetes mellitus with diabetic chronic kidney disease: Secondary | ICD-10-CM

## 2021-08-21 DIAGNOSIS — E1159 Type 2 diabetes mellitus with other circulatory complications: Secondary | ICD-10-CM

## 2021-08-22 DIAGNOSIS — I129 Hypertensive chronic kidney disease with stage 1 through stage 4 chronic kidney disease, or unspecified chronic kidney disease: Secondary | ICD-10-CM | POA: Diagnosis not present

## 2021-08-22 DIAGNOSIS — N2581 Secondary hyperparathyroidism of renal origin: Secondary | ICD-10-CM | POA: Diagnosis not present

## 2021-08-22 DIAGNOSIS — R809 Proteinuria, unspecified: Secondary | ICD-10-CM | POA: Diagnosis not present

## 2021-08-22 DIAGNOSIS — N041 Nephrotic syndrome with focal and segmental glomerular lesions: Secondary | ICD-10-CM | POA: Diagnosis not present

## 2021-08-22 DIAGNOSIS — N1832 Chronic kidney disease, stage 3b: Secondary | ICD-10-CM | POA: Diagnosis not present

## 2021-08-23 ENCOUNTER — Encounter: Payer: Self-pay | Admitting: Family Medicine

## 2021-08-23 ENCOUNTER — Ambulatory Visit (INDEPENDENT_AMBULATORY_CARE_PROVIDER_SITE_OTHER): Payer: PPO | Admitting: Family Medicine

## 2021-08-23 VITALS — BP 120/55 | HR 59 | Temp 97.8°F | Resp 16 | Wt 249.0 lb

## 2021-08-23 DIAGNOSIS — E1121 Type 2 diabetes mellitus with diabetic nephropathy: Secondary | ICD-10-CM | POA: Diagnosis not present

## 2021-08-23 DIAGNOSIS — E039 Hypothyroidism, unspecified: Secondary | ICD-10-CM | POA: Diagnosis not present

## 2021-08-23 DIAGNOSIS — I10 Essential (primary) hypertension: Secondary | ICD-10-CM | POA: Diagnosis not present

## 2021-08-23 LAB — BASIC METABOLIC PANEL
BUN: 42 — AB (ref 4–21)
Creatinine: 2 — AB (ref 0.6–1.3)
Glucose: 100
Potassium: 4.7 mEq/L (ref 3.5–5.1)
Sodium: 140 (ref 137–147)

## 2021-08-23 LAB — MICROALBUMIN, URINE: Microalb, Ur: 92

## 2021-08-23 LAB — COMPREHENSIVE METABOLIC PANEL: eGFR: 22

## 2021-08-23 LAB — MICROALBUMIN / CREATININE URINE RATIO: Microalb Creat Ratio: 1070

## 2021-08-23 NOTE — Patient Instructions (Signed)
.   Please review the attached list of medications and notify my office if there are any errors.   . Please bring all of your medications to every appointment so we can make sure that our medication list is the same as yours.   

## 2021-08-23 NOTE — Progress Notes (Signed)
?  ? ?I,[name],acting as a scribe for Jacob Huh, MD.,have documented all relevant documentation on the behalf of Jacob Huh, MD,as directed by  Jacob Huh, MD while in the presence of Jacob Huh, MD.' ? ?Established patient visit ? ? ?Patient: Jacob Herrera   DOB: Apr 01, 1951   71 y.o. Male  MRN: [id number] ?Visit Date: 08/23/2021 ? ?Today's healthcare provider: Lelon Huh, MD  ? ?Chief Complaint  ?Patient presents with  ? Diabetes  ? Hyperlipidemia  ? Hypertension  ? ?Subjective  ?  ?HPI  ?Diabetes Mellitus Type II, Follow-up ? ?Lab Results  ?Component Value Date  ? HGBA1C 5.4 05/13/2021  ? HGBA1C 6.2 (H) 11/12/2020  ? HGBA1C 6.3 (A) 11/12/2020  ? ?Wt Readings from Last 3 Encounters:  ?08/23/21 249 lb (112.9 kg)  ?05/13/21 245 lb (111.1 kg)  ?04/04/21 237 lb (107.5 kg)  ? ?Last seen for diabetes 4 months ago.  ?Management since then includes well controlled, doing well with dulaglutide /reduced Basaglar to 23 units daily ?He reports excellent compliance with treatment. ?He is not having side effects. ?Symptoms: ?No fatigue No foot ulcerations  ?No appetite changes No nausea  ?No paresthesia of the feet  No polydipsia  ?No polyuria No visual disturbances   ?No vomiting   ? ? ?Home blood sugar records: fasting 100 - 123. Is averaging 107 on CGM ? ?Episodes of hypoglycemia? No  ? ?Most Recent Eye Exam: next appt in July or August '23 ?Current exercise: none ?Current diet habits: in general, a "healthy" diet   ? ?Pertinent Labs: ?Lab Results  ?Component Value Date  ? CHOL 119 11/12/2020  ? HDL 33 (L) 11/12/2020  ? [place] 52 11/12/2020  ? TRIG 209 (H) 11/12/2020  ? CHOLHDL 3.6 11/12/2020  ? Lab Results  ?Component Value Date  ? NA 140 08/23/2021  ? K 4.7 08/23/2021  ? CREATININE 2.0 (A) 08/23/2021  ? GFRNONAA 33 11/08/2020  ? MICROALBUR 92 08/23/2021  ?  ? ?---------------------------------------------------------------------------------------------------  ?Lipid/Cholesterol, Follow-up ? ?Last  lipid panel Other pertinent labs  ?Lab Results  ?Component Value Date  ? CHOL 119 11/12/2020  ? HDL 33 (L) 11/12/2020  ? [place] 52 11/12/2020  ? TRIG 209 (H) 11/12/2020  ? CHOLHDL 3.6 11/12/2020  ? Lab Results  ?Component Value Date  ? ALT 17 10/07/2018  ? AST 23 10/07/2018  ? PLT 249 11/08/2020  ? TSH 3.380 11/12/2020  ?  ? ?He was last seen for this 4 months ago.  ?Management since that visit includes no changes. ? ?He reports excellent compliance with treatment. ?He is not having side effects.  ? ?Symptoms: ?No chest pain No chest pressure/discomfort  ?No dyspnea No lower extremity edema  ?No numbness or tingling of extremity No orthopnea  ?No palpitations No paroxysmal nocturnal dyspnea  ?No speech difficulty No syncope  ? ?The ASCVD Risk score (Arnett DK, et al., 2019) failed to calculate for the following reasons: ?  The valid total cholesterol range is 130 to 320 mg/dL ? ?---------------------------------------------------------------------------------------------------  ?Hypertension, follow-up ? ?BP Readings from Last 3 Encounters:  ?08/23/21 (!) 120/55  ?05/13/21 (!) 139/57  ?04/04/21 127/75  ? Wt Readings from Last 3 Encounters:  ?08/23/21 249 lb (112.9 kg)  ?05/13/21 245 lb (111.1 kg)  ?04/04/21 237 lb (107.5 kg)  ?  ? ?He was last seen for hypertension 4 months ago.  ?BP at that visit was 133 60. Management since that visit includes no changes. ? ?He reports excellent compliance with treatment. ?  He is not having side effects.  ?He does not smoke. ? ?Use of agents associated with hypertension: none.  ? ?Outside blood pressures are: does not take readings  ?Symptoms: ?No chest pain No chest pressure  ?No palpitations No syncope  ?No dyspnea No orthopnea  ?No paroxysmal nocturnal dyspnea No lower extremity edema  ?The ASCVD Risk score (Arnett DK, et al., 2019) failed to calculate for the following reasons: ?  The valid total cholesterol range is 130 to 320  mg/dL ? ?---------------------------------------------------------------------------------------------------  ?Medications: ?Outpatient Medications Prior to Visit  ?Medication Sig  ? acetaminophen (TYLENOL) 500 MG tablet Take 500 mg by mouth every 6 (six) hours as needed.  ? albuterol (PROVENTIL) (2.5 MG/3ML) 0.083% nebulizer solution Take 3 mLs (2.5 mg total) by nebulization every 4 (four) hours. And PRN (Patient taking differently: Take 2.5 mg by nebulization as needed.)  ? amLODipine (NORVASC) 10 MG tablet Take 1 tablet (10 mg total) by mouth daily.  ? aspirin 81 MG tablet Take 81 mg by mouth daily.  ? atorvastatin (LIPITOR) 80 MG tablet TAKE ONE TABLET AT BEDTIME  ? calcitRIOL (ROCALTROL) 0.25 MCG capsule Take 0.25 mcg by mouth daily.  ? carvedilol (COREG) 25 MG tablet TAKE 1 TABLET BY MOUTH TWICE DAILY  ? cetirizine (ZYRTEC) 10 MG tablet Take 10 mg by mouth daily.  ? cloNIDine (CATAPRES) 0.2 MG tablet TAKE 1 TABLET BY MOUTH TWICE DAILY  ? Continuous Blood Gluc Receiver (FREESTYLE LIBRE 2 READER) DEVI USE TO MONITOR BLOOD SUGARS AS DIRECTED  ? Continuous Blood Gluc Sensor (FREESTYLE LIBRE 2 SENSOR) MISC Place 1 sensor on the skin every 14 days to monitor blood sugars continuously  ? Cyanocobalamin (VITAMIN B 12 PO) Take 1,000 mcg by mouth daily.   ? doxazosin (CARDURA) 8 MG tablet TAKE ONE TABLET BY MOUTH EVERY DAY -STOP FLOMAX-  ? Dulaglutide (TRULICITY) 1.5 DZ/3.2DJ SOPN Inject 1.5 mg into the skin once a week. Via [employer] patient assistance through Dec 2023  ? erythromycin ophthalmic ointment Apply 1 a small amount into right eye every night  ? finasteride (PROSCAR) 5 MG tablet Take 1 tablet (5 mg total) by mouth daily.  ? furosemide (LASIX) 40 MG tablet Take 40 mg by mouth 2 (two) times daily.   ? gabapentin (NEURONTIN) 600 MG tablet TAKE 1 TABLET BY MOUTH 2 TIMES DAILY  ? hydrALAZINE (APRESOLINE) 100 MG tablet TAKE 1 TABLET BY MOUTH 3 TIMES DAILY  ? Insulin Glargine (BASAGLAR KWIKPEN) 100 UNIT/ML Inject  25 Units into the skin daily. Via [employer] patient assistance through Dec 2023  ? insulin lispro (HUMALOG KWIKPEN) 100 UNIT/ML KwikPen Inject 6-10 Units into the skin 3 (three) times daily. Sliding scale. Via [employer] patient assistance through Dec 2023  ? Insulin Pen Needle (BD PEN NEEDLE NANO U/F) 32G X 4 MM MISC USE DAILY AS DIRECTED  ? INVOKANA 100 MG TABS tablet Take 100 mg by mouth daily.  ? isosorbide mononitrate (IMDUR) 60 MG 24 hr tablet TAKE 1 TABLET BY MOUTH DAILY  ? levothyroxine (SYNTHROID) 125 MCG tablet TAKE 1 TABLET EVERY DAY ON EMPTY STOMACHWITH A GLASS OF WATER AT LEAST 30-60 MINBEFORE BREAKFAST  ? losartan (COZAAR) 100 MG tablet Take 1 tablet (100 mg total) by mouth daily.  ? Lysine 500 MG CAPS Take 1 capsule by mouth daily.  ? ONETOUCH ULTRA test strip USE AS DIRECTED THREE TIMES A DAY  ? pantoprazole (PROTONIX) 40 MG tablet Take 1 tablet (40 mg total) by mouth daily  before breakfast.  ? senna (SENOKOT) 8.6 MG tablet Take by mouth.  ? SENNA PO Take 1 capsule by mouth in the morning, at noon, in the evening, and at bedtime.  ? SYMBICORT 80-4.5 MCG/ACT inhaler INHALE 2 PUFFS INTO LUNGS TWICE DAILY  ? clopidogrel (PLAVIX) 75 MG tablet Take 1 tablet (75 mg total) by mouth daily. (Patient not taking: Reported on 08/23/2021)  ? ?No facility-administered medications prior to visit.  ? ? ?Review of Systems ? ? ?  Objective  ?  ?BP (!) 120/55 (BP Location: Left Arm, Patient Position: Sitting, Cuff Size: Normal)   Pulse (!) 59   Temp 97.8 ?F (36.6 ?C) (Oral)   Resp 16   Wt 249 lb (112.9 kg)   SpO2 99%   BMI 37.86 kg/m?  ? ? ?Physical Exam  ? ?General: Appearance:    Obese male in no acute distress  ?Eyes:    PERRL, conjunctiva/corneas clear, EOM's intact       ?Lungs:     Clear to auscultation bilaterally, respirations unlabored  ?Heart:    Bradycardic. Normal rhythm. No murmurs, rubs, or gallops.    ?MS:   All extremities are intact.    ?Neurologic:   Awake, alert, oriented x 3. No apparent  focal neurological defect.   ?   ?  ? Assessment & Plan  ?  ? ?1. Resistant hypertension ?Now well controlled on current medications regiment. Continue routine follow upnephrology.  ? 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

## 2021-08-29 DIAGNOSIS — I10 Essential (primary) hypertension: Secondary | ICD-10-CM | POA: Diagnosis not present

## 2021-08-29 DIAGNOSIS — E1122 Type 2 diabetes mellitus with diabetic chronic kidney disease: Secondary | ICD-10-CM | POA: Diagnosis not present

## 2021-08-29 DIAGNOSIS — N041 Nephrotic syndrome with focal and segmental glomerular lesions: Secondary | ICD-10-CM | POA: Diagnosis not present

## 2021-08-29 DIAGNOSIS — N2581 Secondary hyperparathyroidism of renal origin: Secondary | ICD-10-CM | POA: Diagnosis not present

## 2021-08-29 DIAGNOSIS — N184 Chronic kidney disease, stage 4 (severe): Secondary | ICD-10-CM | POA: Diagnosis not present

## 2021-09-02 ENCOUNTER — Telehealth: Payer: Self-pay | Admitting: Family Medicine

## 2021-09-02 NOTE — Telephone Encounter (Signed)
Pt is calling to ask if Dr. Caryn Section can write generic script for doxazosin (CARDURA) 8 MG tablet [[id number]] . Generic tier 1 number terazosin. ? ?Preferred Pharmacy- [facility] ? ?CB- [phone] ?

## 2021-09-14 ENCOUNTER — Other Ambulatory Visit: Payer: Self-pay | Admitting: Family Medicine

## 2021-09-15 ENCOUNTER — Telehealth: Payer: Self-pay

## 2021-09-15 NOTE — Progress Notes (Signed)
Error

## 2021-09-15 NOTE — Telephone Encounter (Signed)
Pt is calling back requesting an update.   Please advise.

## 2021-09-15 NOTE — Telephone Encounter (Signed)
It looks like he had a 90 supply of doxazosin filled on 4/28. Doesn't he want to finish that, then we can change to terazosin when it runs out if terazosin has a lower copay.... assuming that's why he wants to change.

## 2021-09-16 ENCOUNTER — Other Ambulatory Visit: Payer: Self-pay | Admitting: Family Medicine

## 2021-09-16 DIAGNOSIS — I10 Essential (primary) hypertension: Secondary | ICD-10-CM

## 2021-09-16 NOTE — Telephone Encounter (Signed)
Pt states he has plenty till about June and requesting new Rx for genetic be sent in.  Also requesting Hydralazine be sent as 90 day supply.  States this will cost him half.

## 2021-09-21 ENCOUNTER — Other Ambulatory Visit: Payer: Self-pay | Admitting: Family Medicine

## 2021-09-21 NOTE — Telephone Encounter (Signed)
Note made to send prescriptions before current 90 day supplies run out in July and August.

## 2021-09-23 ENCOUNTER — Other Ambulatory Visit: Payer: Self-pay | Admitting: Family Medicine

## 2021-09-23 DIAGNOSIS — E785 Hyperlipidemia, unspecified: Secondary | ICD-10-CM

## 2021-10-03 ENCOUNTER — Telehealth: Payer: Self-pay

## 2021-10-03 NOTE — Progress Notes (Cosign Needed)
Patient states he has receive another shipment of Humalog without requesting it.Patient reports he has 8-9 boxes on hand, so he reach out to [facility] and was able to get everything fix. [facility] apologize, and inform patient he will receive a shipment for [place] in a few days.   Patient reports his Nephrologist stop invokana and started [place]. Patient states he had his [name] filled on 09/06/2021 for 90 day supply which cost him around $200.00.Patient is asking if there is any assistance to help with the cost.  Patient gave verbal consent to complete the online application over the phone with me. Patient was approve through [employer] with a start date of 10/03/2021 and end date of 04/23/2022.   I reach out to [facility] associate where I left a voice message to request them to send a prescription of [name] to [facility].My contact information was left for them to return my call.I will check in one week to confirm [employer] has received and submit it.  Clinical Pharmacist Notified of all of the above.  10/11/2021: I reach out to [employer] to confirm has received prescription and it was submit it.Per [employer], they have not received any prescription as of 10/11/2021.  I reach out to [facility] associate where I left a voice message to request them to send a prescription of [name] to [facility].My contact information was left for them to return my call.I reach out to the patient to inform him to call [facility] associate to request the Prescription to be sent to the correct pharmacy instead of [facility], so he will be able to receive assistance.  [facility] return my call , and inform me they have sent over the prescriptions to [facility].  [place] Pharmacist Assistant [phone]

## 2021-10-14 ENCOUNTER — Encounter: Payer: Self-pay | Admitting: Family Medicine

## 2021-10-21 ENCOUNTER — Other Ambulatory Visit: Payer: Self-pay | Admitting: Urology

## 2021-10-21 ENCOUNTER — Telehealth: Payer: Self-pay

## 2021-10-21 DIAGNOSIS — N401 Enlarged prostate with lower urinary tract symptoms: Secondary | ICD-10-CM

## 2021-10-21 NOTE — Progress Notes (Signed)
I reach out to [facility] to confirm if they have received patient prescription for [name] and submit it.   Per [employer],prescription was received and shipment has been shipped on 10/17/2021.  Patient verbalized understanding.  [place] Pharmacist Assistant [phone]

## 2021-11-03 ENCOUNTER — Other Ambulatory Visit: Payer: Self-pay | Admitting: Family Medicine

## 2021-11-03 DIAGNOSIS — I10 Essential (primary) hypertension: Secondary | ICD-10-CM

## 2021-11-03 MED ORDER — TERAZOSIN HCL 5 MG PO CAPS: 5.0000 mg | ORAL_CAPSULE | Freq: Every day | ORAL | 1 refills | Status: DC

## 2021-11-15 ENCOUNTER — Ambulatory Visit (INDEPENDENT_AMBULATORY_CARE_PROVIDER_SITE_OTHER): Payer: PPO

## 2021-11-15 DIAGNOSIS — E1121 Type 2 diabetes mellitus with diabetic nephropathy: Secondary | ICD-10-CM

## 2021-11-15 MED ORDER — BASAGLAR KWIKPEN 100 UNIT/ML ~~LOC~~ SOPN: 21.0000 [IU] | PEN_INJECTOR | Freq: Every day | SUBCUTANEOUS | Status: DC

## 2021-11-15 NOTE — Progress Notes (Signed)
Chronic Care Management Pharmacy Note  11/15/2021 Name:  Jacob Herrera MRN:  [id number] DOB:  04-22-1951  Summary: Patient presents for CCM follow-up.    -Clopidogrel stopped by cardiology, patient to continue with aspirin indefinitely.  -Reports hypoglycemic symptoms. 8 instances of hypoglycemia in past 2 weeks, with low noted on CGM report of 53. Has been self-adjusting dose of Basaglar due to lows, decreased to 18 units on 18th-20th then increased back to 21 units   Recommendations/Changes made from today's visit: -DECREASE Basaglar to 21 units daily.  -Recheck CGM report in 1 week.   Plan: CPP follow-up 3 months  Subjective: Jacob Herrera is an 71 y.o. year old male who is a primary patient of [name], [name], MD.  The CCM team was consulted for assistance with disease management and care coordination needs.    Engaged with patient face to face for follow up visit in response to provider referral for pharmacy case management and/or care coordination services.   Consent to Services:  The patient was given information about Chronic Care Management services, agreed to services, and gave verbal consent prior to initiation of services.  Please see initial visit note for detailed documentation.   Patient Care Team: [name], MD as PCP - General (Family Medicine) [name], MD as Consulting Physician (Nephrology) Ubaldo Glassing Javier Docker, MD as Consulting Physician (Cardiology) [name], MD as Referring Physician (Dermatology) [name], MD (Urology) [name], [facility] (Pharmacist) Annamaria Helling, DO as Consulting Physician (Gastroenterology)  Recent office visits: 08/23/21: Patient presented to Dr. Caryn Section for follow-up. BP 120/55.  04/27/21: patient presented to Dr. Caryn Section for URI. Azithromycin.  11/12/20: patient presented to Dr. Caryn Section for follow-up. A1c 6.1%.    Recent consult visits: 08/29/21: Patient presented to Dr. Candiss Norse (nephrology).  Stop Invokana, [name].  05/24/21: Patient presented to Dr. Juleen China (nephrology), decrease furosemide 40 mg daily.   Hospital visits: None in past 6 months   Objective:  Lab Results  Component Value Date   CREATININE 2.0 (A) 08/23/2021   BUN 42 (A) 08/23/2021   GFRNONAA 33 11/08/2020   GFRAA 42 (L) 10/07/2018   NA 140 08/23/2021   K 4.7 08/23/2021   CALCIUM 10.3 11/08/2020   CO2 22 11/08/2020   GLUCOSE 134 (H) 10/07/2018    Lab Results  Component Value Date/Time   HGBA1C 5.4 05/13/2021 03:23 PM   HGBA1C 6.2 (H) 11/12/2020 02:14 PM   HGBA1C 6.3 (A) 11/12/2020 01:50 PM   HGBA1C 6.3 (H) 03/20/2017 03:03 PM   MICROALBUR 92 08/23/2021 12:00 AM   MICROALBUR neg 03/20/2017 02:25 PM    Last diabetic Eye exam:  Lab Results  Component Value Date/Time   HMDIABEYEEXA No Retinopathy 01/24/2021 12:00 AM    Last diabetic Foot exam: No results found for: "HMDIABFOOTEX"   Lab Results  Component Value Date   CHOL 119 11/12/2020   HDL 33 (L) 11/12/2020   LDLCALC 52 11/12/2020   TRIG 209 (H) 11/12/2020   CHOLHDL 3.6 11/12/2020       Latest Ref Rng & Units 11/08/2020   12:00 AM 10/07/2018    8:25 AM 12/21/2016    3:27 PM  Hepatic Function  Total Protein 6.0 - 8.5 g/dL  6.6  7.0   Albumin 3.5 - 5.0 3.9     4.2  4.3   AST 0 - 40 IU/L  23  19   ALT 0 - 44 IU/L  17  21   Alk Phosphatase  39 - 117 IU/L  47  68   Total Bilirubin 0.0 - 1.2 mg/dL  0.2  0.2      This result is from an external source.    Lab Results  Component Value Date/Time   TSH 3.380 11/12/2020 02:14 PM   TSH 2.720 05/26/2019 04:16 PM   FREET4 1.30 11/12/2020 02:14 PM       Latest Ref Rng & Units 11/08/2020   12:00 AM 11/09/2014   10:30 AM 07/08/2013    5:55 AM  CBC  WBC  6.1     6.6  8.0   Hemoglobin 13.5 - 17.5 10.7     12.4  13.5   Hematocrit 41 - 53 32     38.9  41.7   Platelets 150 - 399 249     241  220      This result is from an external source.    Lab Results  Component Value Date/Time    VD25OH 22.8 (L) 05/26/2019 04:16 PM    Clinical ASCVD: No  The ASCVD Risk score (Arnett DK, et al., 2019) failed to calculate for the following reasons:   The valid total cholesterol range is 130 to 320 mg/dL       05/13/2021    2:58 PM 11/12/2020    1:45 PM 03/15/2020    3:36 PM  Depression screen PHQ 2/9  Decreased Interest 0 0 0  Down, Depressed, Hopeless 0 0 0  PHQ - 2 Score 0 0 0  Altered sleeping 0 1 0  Tired, decreased energy 1 2 0  Change in appetite 0 0 0  Feeling bad or failure about yourself  0 0 0  Trouble concentrating 0 0 0  Moving slowly or fidgety/restless 0 0 0  Suicidal thoughts 0 0 0  PHQ-9 Score 1 3 0  Difficult doing work/chores Not difficult at all Not difficult at all Not difficult at all    Social History   Tobacco Use  Smoking Status Former   Packs/day: 1.00   Years: 25.00   Total pack years: 25.00   Types: Cigarettes   Quit date: 05/29/1988   Years since quitting: 33.4  Smokeless Tobacco Current   Types: Chew  Tobacco Comments   chews 1 bag of loose leaf chew/week   BP Readings from Last 3 Encounters:  08/23/21 (!) 120/55  05/13/21 (!) 139/57  04/04/21 127/75   Pulse Readings from Last 3 Encounters:  08/23/21 (!) 59  05/13/21 (!) 56  04/04/21 74   Wt Readings from Last 3 Encounters:  08/23/21 249 lb (112.9 kg)  05/13/21 245 lb (111.1 kg)  04/04/21 237 lb (107.5 kg)   BMI Readings from Last 3 Encounters:  08/23/21 37.86 kg/m  05/13/21 37.25 kg/m  04/04/21 36.04 kg/m    Assessment/Interventions: Review of patient past medical history, allergies, medications, health status, including review of consultants reports, laboratory and other test data, was performed as part of comprehensive evaluation and provision of chronic care management services.   SDOH:  (Social Determinants of Health) assessments and interventions performed: Yes      CCM Care Plan  Allergies  Allergen Reactions   Chlorthalidone Other (See Comments)     D/c by dr. Juleen China for worsening renal function 03/2019   Spironolactone Other (See Comments)    gynecomastia    Medications Reviewed Today     Reviewed by [name], [facility] (Pharmacist) on 11/15/21 at 1118  Med List Status: <None>   Medication  Order Taking? Sig Documenting Provider Last Dose Status Informant  acetaminophen (TYLENOL) 500 MG tablet [id number]  Take 500 mg by mouth every 6 (six) hours as needed. [provider]  Active   albuterol (PROVENTIL) (2.5 MG/3ML) 0.083% nebulizer solution [id number]  Take 3 mLs (2.5 mg total) by nebulization every 4 (four) hours. And PRN  Patient taking differently: Take 2.5 mg by nebulization as needed.   Flora Lipps, MD  Active            Med Note [facility], Ria Comment S   Tue May 11, 2020  8:29 AM) PRN, hasnt had to use in 2-3 months  amLODipine (NORVASC) 10 MG tablet [id number]  Take 1 tablet (10 mg total) by mouth daily. Teodoro Spray, MD  Active   aspirin 81 MG tablet [id number]  Take 81 mg by mouth daily. [provider]  Active   atorvastatin (LIPITOR) 80 MG tablet [id number]  TAKE 1 TABLET BY MOUTH NIGHTLY [name], MD  Active   carvedilol (COREG) 25 MG tablet [id number]  TAKE 1 TABLET BY MOUTH TWICE DAILY [name], MD  Active   cetirizine (ZYRTEC) 10 MG tablet [id number]  Take 10 mg by mouth daily. [provider]  Active   cloNIDine (CATAPRES) 0.2 MG tablet [id number]  TAKE 1 TABLET BY MOUTH TWICE DAILY [name], [name], MD  Active   Continuous Blood Gluc Receiver (FREESTYLE LIBRE 2 READER) [place]VI [id number]  USE TO MONITOR BLOOD SUGARS AS DIRECTED [name], MD  Active   Continuous Blood Gluc Sensor (FREESTYLE LIBRE 2 SENSOR) [place] [id number]  Place 1 sensor on the skin every 14 days to monitor blood sugars continuously [name], MD  Active   Cyanocobalamin (VITAMIN B 12 PO) [id number]  Take 1,000 mcg by mouth daily.  [provider]  Active Self  dapagliflozin propanediol  ([name]) 10 MG TABS tablet [id number] Yes Take 10 mg by mouth daily. [provider]  Active   Dulaglutide (TRULICITY) 1.5 TW/4.4QK SOPN [id number]  Inject 1.5 mg into the skin once a week. [place] patient assistance through Dec 2023 [name], MD  Active   erythromycin ophthalmic ointment [id number]  Apply 1 a small amount into right eye every night [provider]  Active   finasteride (PROSCAR) 5 MG tablet [id number]  TAKE ONE TABLET EVERY DAY Stoioff, Ronda Fairly, MD  Active   furosemide (LASIX) 40 MG tablet [id number]  Take 40 mg by mouth 2 (two) times daily.  [provider]  Active   gabapentin (NEURONTIN) 600 MG tablet [id number]  TAKE 1 TABLET BY MOUTH 2 TIMES DAILY [name], MD  Active   hydrALAZINE (APRESOLINE) 100 MG tablet [id number]  TAKE 1 TABLET BY MOUTH 3 TIMES DAILY [name], MD  Active   Insulin Glargine [facility] KWIKPEN) 100 UNIT/ML [id number]  Inject 25 Units into the skin daily. [place] patient assistance through Dec 2023 [name], MD  Active   insulin lispro (HUMALOG [facility]) 100 UNIT/ML KwikPen [id number]  Inject 6-10 Units into the skin 3 (three) times daily. Sliding scale. [place] patient assistance through Dec 2023 [name], MD  Active   Insulin Pen Needle (BD PEN NEEDLE NANO U/F) 32G X 4 MM MISC [id number]  USE DAILY AS DIRECTED [name], MD  Active   isosorbide mononitrate (IMDUR) 60 MG 24 hr tablet [id number]  TAKE 1 TABLET BY MOUTH DAILY [name], [name], MD  Active   levothyroxine (SYNTHROID) 125 MCG tablet [id number]  TAKE 1 TABLET EVERY DAY ON EMPTY STOMACHWITH A GLASS OF WATER AT LEAST 30-60 MINBEFORE BREAKFAST Malva Limes, MD  Active   losartan (COZAAR) 100 MG tablet [id number]  Take 1 tablet (100 mg total) by mouth daily. Dalia Heading, MD  Active   Lysine 500 MG CAPS [id number]  Take 1 capsule by mouth daily. [provider]  Active   Koren Bound test strip  [id number]  USE AS DIRECTED THREE TIMES A DAY Malva Limes, MD  Active   pantoprazole (PROTONIX) 40 MG tablet [id number]  Take 1 tablet (40 mg total) by mouth daily before breakfast. Malva Limes, MD  Active   senna (SENOKOT) 8.6 MG tablet [id number]  Take by mouth. [provider]  Active Self  SENNA PO [id number]  Take 1 capsule by mouth in the morning, at noon, in the evening, and at bedtime. [provider]  Active Self  SYMBICORT 80-4.5 MCG/ACT inhaler [id number]  INHALE 2 PUFFS INTO LUNGS TWICE DAILY Erin Fulling, MD  Active Self           Med Note Lauretta Chester, Mylena Sedberry A   Mon Aug 15, 2021  3:06 PM) Takes PRN  terazosin (HYTRIN) 5 MG capsule [id number]  Take 1 capsule (5 mg total) by mouth daily. TAKE IN PLACE OF DOXAZOSIN Malva Limes, MD  Active   Med List Note Beverlyn Roux, RN 03/31/15 1507): UDS done 08-11-14 Meds to last until 10-09-14            Patient Active Problem List   Diagnosis Date Noted   Vitamin D deficiency 05/27/2019   Vitamin B12 deficiency 05/27/2019   Benign hypertensive kidney disease with chronic kidney disease 04/23/2019   Nephrotic syndrome, focal and segmental glomerular lesions 04/23/2019   Proteinuria 04/23/2019   Secondary hyperparathyroidism of renal origin (HCC) 04/23/2019   Trigger middle finger of right hand 01/20/2019   Carpal tunnel syndrome, right 01/20/2019   Focal segmental glomerulosclerosis 03/26/2018   Primary osteoarthritis of first carpometacarpal joint of right hand 07/30/2016   Coronary artery disease with unstable angina pectoris (HCC) 03/31/2015   Atherosclerotic heart disease of native coronary artery with unstable angina pectoris (HCC) 03/31/2015   Diastolic heart failure (HCC) 02/15/2015   Regional wall motion abnormality of heart 02/15/2015   Abnormal findings on diagnostic imaging of heart and coronary circulation 02/15/2015   Restrictive lung disease 11/10/2014   History of adenomatous polyp of colon  11/09/2014   Hypothyroidism 11/09/2014   Diabetes mellitus with nephropathy (HCC) 11/09/2014   Hyperlipidemia 11/09/2014   Depression 11/09/2014   Insomnia 11/09/2014   Resistant hypertension 11/09/2014   GERD (gastroesophageal reflux disease) 11/09/2014   Chronic kidney disease (CKD), stage III (moderate) (HCC) 11/09/2014   Dyspnea on exertion 11/09/2014   History of basal cell cancer 09/15/2014   DDD (degenerative disc disease), lumbar 09/09/2014   Facet syndrome, lumbar 09/09/2014   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   Greater trochanteric bursitis of both hips 09/09/2014   Allergic rhinitis 08/12/2014   Benign prostatic hyperplasia with urinary obstruction 06/24/2013   Nocturia 05/30/2013   Frequency of micturition 05/30/2013   Urge incontinence 05/30/2013   Asthma, chronic 05/29/2013   Morbid obesity (HCC) 05/29/2013   OSA treated with BiPAP 05/29/2013   Scoliosis 11/28/2011   SCC (squamous cell carcinoma), face 08/01/2011    Immunization History  Administered Date(s) Administered   Fluad Quad(high Dose 65+) 01/21/2019  Influenza Split 02/26/2013, 01/12/2014   Influenza, High Dose Seasonal PF 02/09/2015, 03/07/2016, 03/20/2017, 02/13/2018, 01/30/2020   PFIZER Comirnaty(Gray Top)Covid-19 Tri-Sucrose Vaccine 06/17/2019, 07/08/2019   PFIZER(Purple Top)SARS-COV-2 Vaccination 06/17/2019, 07/08/2019, 01/30/2020   Pneumococcal Conjugate-13 01/16/2014   Pneumococcal Polysaccharide-23 07/07/2008, 03/07/2016   Td 01/05/2003   Tdap 07/07/2008   Zoster Recombinat (Shingrix) 09/28/2017, 12/21/2017    Conditions to be addressed/monitored:  Hypertension, Hyperlipidemia, Diabetes, Heart Failure, Asthma, Chronic Kidney Disease, Hypothyroidism, Depression, BPH and Insomnia  Care Plan : General Pharmacy (Adult)  Updates made by [name], RPH since 11/15/2021 12:00 AM     Problem: Hypertension, Hyperlipidemia, Diabetes, Heart Failure, Asthma, Chronic Kidney Disease,  Hypothyroidism, Depression, BPH and Insomnia   Priority: High     Long-Range Goal: Patient-Specific Goal   Start Date: 07/13/2020  Expected End Date: 11/16/2022  This Visit's Progress: On track  Recent Progress: On track  Priority: High  Note:   Current Barriers:  Unable to independently afford treatment regimen  Pharmacist Clinical Goal(s):  Patient will verbalize ability to afford treatment regimen through collaboration with PharmD and provider.   Interventions: 1:1 collaboration with [name], MD regarding development and update of comprehensive plan of care as evidenced by provider attestation and co-signature Inter-disciplinary care team collaboration (see longitudinal plan of care) Comprehensive medication review performed; medication list updated in electronic medical record  Hyperlipidemia: (LDL goal < 70) -Controlled -History of CAD s/p PCI 2016 -Current treatment: Atorvastatin 80 mg daily: Appropriate, Effective, Query Safe, Accessible   -Current treatment: Aspirin 81 mg daily: Appropriate, Effective, Query Safe, Accessible -Medications previously tried: NA  -Clopidogrel stopped by cardiology, patient to continue with aspirin indefinitely.  -Educated on Importance of limiting foods high in cholesterol; -Recommended to continue current medication  Diabetes (A1c goal <8%) -Controlled -Current medications: Trulicity 1.5 mg weekly (fridays): Appropriate, Effective, Safe, Accessible  Humalog sliding scale (4-8 units typically) -adjusts based on carb intake: Appropriate, Effective, Safe, Accessible   Basaglar 21 units daily: Appropriate, Effective, Query Safe, Accessible   Has been self-adjusting dose due to lows, decreased to 18 units on 18th-20th then increased back to 21 units  [name] 10 mg daily: Appropriate, Effective, Safe, Accessible   -Medications previously tried: Bydureon, Glipizide, Victoza, metformin, Ozempic, Actos, Januvia, Invokana -Current home  glucose readings  Target Date(s)   Number of days worn ? 14 days 1/3-1/16 4/11-4/24 7/12-7/25  % of time active ? 70% 92% 89% 92%  Mean Glucose (mg/dL)  104  111 113  GMI (2-week A1c estimate)  5.8% 6.0% 6.0%  Glycemic Variability (%CV) ?36% 21.3% 21.3% 30.9%  Time above >250 mg/dL <10% 0% 0% 0%  Time above 70-180 mg/dL <50% 0% 0% 4%  Time in range: 70-180 mg/dL >50% 96% 99% 89%  Time below 70 mg/dL <1% 4% 1% 7%  -Reports hypoglycemic symptoms. 8 instances of hypoglycemia in past 2 weeks, with low noted on CGM report of 53.  -Current dietary patterns:  Tilapia + green beans  -Current exercise: Sedentary. Patient is limited by sciatica, neuropathy pain.  -DECREASE Basaglar to 21 units daily.  -Recheck CGM report in 1 week.   Heart Failure (Goal: manage symptoms and prevent exacerbations) -Controlled -Last ejection fraction: 50% -HF type: Diastolic -NYHA Class: II (slight limitation of activity) -AHA HF Stage: C (Heart disease and symptoms present) -Current treatment: Amlodipine 10 mg daily  Carvedilol 25 mg twice daily  Clonidine 0.2 mg twice daily   Furosemide 40 mg twice daily  Hydralazine 100 mg three times  daily  Imdur 60 mg daily  Losartan 100 mg nightly  Terazosin 5 mg daily -Medications previously tried: Chlorthalidone, spironolactone, nebivolol   -Current home BP/HR readings: NA, does not monitor at home. -Recommended to continue current medication  Hypothyroidism (Goal: Maintain stable thyroid function) -Controlled -Current treatment  Levothyroxine 125 mcg daily before breakfast  -Medications previously tried: NA  -Recommended to continue current medication  Asthma with allergic rhinitis (Goal: control symptoms and prevent exacerbations) -Controlled -Current treatment  Symbicort 2 puffs twice daily as needed  -Medications previously tried: NA  -Exacerbations requiring treatment in last 6 months: None -Frequency of rescue inhaler use: Infrequent -Patient with  daily congestion  -Recommended to continue current medication  Chronic Kidney Disease Stage 3b  -All medications assessed for renal dosing and appropriateness in chronic kidney disease. -Recommended to continue current medication  Patient Goals/Self-Care Activities Patient will:  - check glucose 3-4 times daily: before meals and at bedtime, document, and provide at future appointments check blood pressure weekly, document, and provide at future appointments  Follow Up Plan:Telephone follow up appointment with care management team member scheduled for:  02/07/2022 at 3:00 PM    Medication Assistance:  Humalog, Basaglar, Trulicity obtained through [employer] medication assistance program.  Enrollment ends Dec 2023   [name] obtained through [place] medication assistance program. [facility] Nephrology  Patient's preferred pharmacy is:  [place], [place] - [address] [place] [address] Phone: [phone] Fax: [phone]  Uses pill box? Yes Pt endorses 100% compliance  We discussed: Current pharmacy is preferred with insurance plan and patient is satisfied with pharmacy services Patient decided to: Continue current medication management strategy  Care Plan and Follow Up Patient Decision:  Patient agrees to Care Plan and Follow-up.  Plan: Telephone follow up appointment with care management team member scheduled for:  02/07/2022 at 3:00 PM  [name], PharmD, [place], [place] Pharmacist Practitioner  [facility] [phone]

## 2021-11-15 NOTE — Patient Instructions (Signed)
Visit Information It was great speaking with you today!  Please let me know if you have any questions about our visit.   Goals Addressed             This Visit's Progress    Monitor and Manage My Blood Sugar-Diabetes Type 2   On track    Timeframe:  Long-Range Goal Priority:  High Start Date:  07/13/20                           Expected End Date: 01/14/23                      Follow Up within 90 days    -check blood sugar at least 5 times daily - check blood sugar if I feel it is too high or too low - enter blood sugar readings and medication or insulin into daily log - take the blood sugar log to all doctor visits    Why is this important?   Checking your blood sugar at home helps to keep it from getting very high or very low.  Writing the results in a diary or log helps the doctor know how to care for you.  Your blood sugar log should have the time, date and the results.  Also, write down the amount of insulin or other medicine that you take.  Other information, like what you ate, exercise done and how you were feeling, will also be helpful.     Notes:         Patient Care Plan: General Pharmacy (Adult)     Problem Identified: Hypertension, Hyperlipidemia, Diabetes, Heart Failure, Asthma, Chronic Kidney Disease, Hypothyroidism, Depression, BPH and Insomnia   Priority: High     Long-Range Goal: Patient-Specific Goal   Start Date: 07/13/2020  Expected End Date: 11/16/2022  This Visit's Progress: On track  Recent Progress: On track  Priority: High  Note:   Current Barriers:  Unable to independently afford treatment regimen  Pharmacist Clinical Goal(s):  Patient will verbalize ability to afford treatment regimen through collaboration with PharmD and provider.   Interventions: 1:1 collaboration with [name], MD regarding development and update of comprehensive plan of care as evidenced by provider attestation and co-signature Inter-disciplinary care team  collaboration (see longitudinal plan of care) Comprehensive medication review performed; medication list updated in electronic medical record  Hyperlipidemia: (LDL goal < 70) -Controlled -History of CAD s/p PCI 2016 -Current treatment: Atorvastatin 80 mg daily: Appropriate, Effective, Query Safe, Accessible   -Current treatment: Aspirin 81 mg daily: Appropriate, Effective, Query Safe, Accessible -Medications previously tried: NA  -Clopidogrel stopped by cardiology, patient to continue with aspirin indefinitely.  -Educated on Importance of limiting foods high in cholesterol; -Recommended to continue current medication  Diabetes (A1c goal <8%) -Controlled -Current medications: Trulicity 1.5 mg weekly (fridays): Appropriate, Effective, Safe, Accessible  Humalog sliding scale (4-8 units typically) -adjusts based on carb intake: Appropriate, Effective, Safe, Accessible   Basaglar 21 units daily: Appropriate, Effective, Query Safe, Accessible   Has been self-adjusting dose due to lows, decreased to 18 units on 18th-20th then increased back to 21 units  [name] 10 mg daily: Appropriate, Effective, Safe, Accessible   -Medications previously tried: Bydureon, Glipizide, Victoza, metformin, Ozempic, Actos, Januvia, Invokana -Current home glucose readings  Target Date(s)   Number of days worn ? 14 days 1/3-1/16 4/11-4/24 7/12-7/25  % of time active ? 70% 92% 89% 92%  Mean Glucose (mg/dL)  104  111 113  GMI (2-week A1c estimate)  5.8% 6.0% 6.0%  Glycemic Variability (%CV) ?36% 21.3% 21.3% 30.9%  Time above >250 mg/dL <10% 0% 0% 0%  Time above 70-180 mg/dL <50% 0% 0% 4%  Time in range: 70-180 mg/dL >50% 96% 99% 89%  Time below 70 mg/dL <1% 4% 1% 7%  -Reports hypoglycemic symptoms. 8 instances of hypoglycemia in past 2 weeks, with low noted on CGM report of 53.  -Current dietary patterns:  Tilapia + green beans  -Current exercise: Sedentary. Patient is limited by sciatica, neuropathy pain.   -DECREASE Basaglar to 21 units daily.  -Recheck CGM report in 1 week.   Heart Failure (Goal: manage symptoms and prevent exacerbations) -Controlled -Last ejection fraction: 50% -HF type: Diastolic -NYHA Class: II (slight limitation of activity) -AHA HF Stage: C (Heart disease and symptoms present) -Current treatment: Amlodipine 10 mg daily  Carvedilol 25 mg twice daily  Clonidine 0.2 mg twice daily   Furosemide 40 mg twice daily  Hydralazine 100 mg three times daily  Imdur 60 mg daily  Losartan 100 mg nightly  Terazosin 5 mg daily -Medications previously tried: Chlorthalidone, spironolactone, nebivolol   -Current home BP/HR readings: NA, does not monitor at home. -Recommended to continue current medication  Hypothyroidism (Goal: Maintain stable thyroid function) -Controlled -Current treatment  Levothyroxine 125 mcg daily before breakfast  -Medications previously tried: NA  -Recommended to continue current medication  Asthma with allergic rhinitis (Goal: control symptoms and prevent exacerbations) -Controlled -Current treatment  Symbicort 2 puffs twice daily as needed  -Medications previously tried: NA  -Exacerbations requiring treatment in last 6 months: None -Frequency of rescue inhaler use: Infrequent -Patient with daily congestion  -Recommended to continue current medication  Chronic Kidney Disease Stage 3b  -All medications assessed for renal dosing and appropriateness in chronic kidney disease. -Recommended to continue current medication  Patient Goals/Self-Care Activities Patient will:  - check glucose 3-4 times daily: before meals and at bedtime, document, and provide at future appointments check blood pressure weekly, document, and provide at future appointments  Follow Up Plan:Telephone follow up appointment with care management team member scheduled for:  02/07/2022 at 3:00 PM    Patient agreed to services and verbal consent obtained.   Patient  verbalizes understanding of instructions and care plan provided today and agrees to view in [place]. Active MyChart status and patient understanding of how to access instructions and care plan via MyChart confirmed with patient.     [name], PharmD, [place], CPP  Clinical Pharmacist Practitioner  [facility] [phone]

## 2021-11-21 ENCOUNTER — Ambulatory Visit: Payer: PPO | Admitting: Family Medicine

## 2021-11-21 ENCOUNTER — Ambulatory Visit: Payer: Self-pay | Admitting: Urology

## 2021-11-21 DIAGNOSIS — E1121 Type 2 diabetes mellitus with diabetic nephropathy: Secondary | ICD-10-CM

## 2021-11-23 ENCOUNTER — Ambulatory Visit: Payer: PPO | Admitting: Urology

## 2021-11-24 ENCOUNTER — Ambulatory Visit: Payer: PPO | Admitting: Urology

## 2021-11-24 ENCOUNTER — Encounter: Payer: Self-pay | Admitting: Urology

## 2021-11-24 VITALS — BP 160/63 | HR 62 | Ht 68.0 in | Wt 240.0 lb

## 2021-11-24 DIAGNOSIS — Z125 Encounter for screening for malignant neoplasm of prostate: Secondary | ICD-10-CM

## 2021-11-24 DIAGNOSIS — N401 Enlarged prostate with lower urinary tract symptoms: Secondary | ICD-10-CM

## 2021-11-24 LAB — BLADDER SCAN AMB NON-IMAGING: Scan Result: 100

## 2021-11-24 NOTE — Progress Notes (Signed)
11/24/2021 3:09 PM   Jacob Herrera 1950-06-19 [id number]  Referring provider: [name], MD [address] [place],  [place] [address]  Chief Complaint  Patient presents with   Benign Prostatic Hypertrophy     Urologic history: 1.  BPH with lower urinary tract symptoms -Combination therapy doxazosin 8 mg/finasteride   HPI: 71 y.o. male presents for annual follow-up.  No significant change since last years visit Stable LUTS Remains on finasteride.  Terazosin 5 mg was substituted for doxazosin Denies dysuria, gross hematuria No flank, abdominal or pelvic pain   PMH: Past Medical History:  Diagnosis Date   Asthma    CHF (congestive heart failure) (HCC)    Chronic lower back pain    Complication of anesthesia    unknow "difficulty" after cardiac cath   Diabetes mellitus, type 2 ([place])    History of chicken pox    Hx of skin cancer, basal cell    Hypertension    Hypothyroidism    Kidney disease    stage 3   Neuropathy    feet   Scoliosis    Sleep apnea    BiPAP   Thyrotoxicosis    2010    Surgical History: Past Surgical History:  Procedure Laterality Date   CARDIAC CATHETERIZATION N/A 03/31/2015   Procedure: Left Heart Cath and Coronary Angiography;  Surgeon: Teodoro Spray, MD;  Location: [place] [facility];  Service: Cardiovascular;  Laterality: N/A;   CARDIAC CATHETERIZATION N/A 03/31/2015   Procedure: Coronary Stent Intervention;  Surgeon: Teodoro Spray, MD;  Location: [place] [facility];  Service: Cardiovascular;  Laterality: N/A;   CARDIAC CATHETERIZATION N/A 03/31/2015   Procedure: Coronary Stent Intervention;  Surgeon: Yolonda Kida, MD;  Location: [place] [facility];  Service: Cardiovascular;  Laterality: N/A;   CATARACT EXTRACTION W/PHACO Right 05/11/2020   Procedure: CATARACT EXTRACTION PHACO AND INTRAOCULAR LENS PLACEMENT (IOC) RIGHT DIABETIC 6.52 01:11.6 9.1%;  Surgeon: Leandrew Koyanagi, MD;  Location: [place];  Service: Ophthalmology;  Laterality: Right;  Diabetic - insulin sleep apnea   CATARACT EXTRACTION W/PHACO Left 06/02/2020   Procedure: CATARACT EXTRACTION PHACO AND INTRAOCULAR LENS PLACEMENT ([place]) LEFT DIABETIC;  Surgeon: Leandrew Koyanagi, MD;  Location: [place];  Service: Ophthalmology;  Laterality: Left;  3.14 0:45.0 7.0%   COLONOSCOPY WITH PROPOFOL N/A 04/04/2021   Procedure: COLONOSCOPY WITH PROPOFOL;  Surgeon: Annamaria Helling, DO;  Location[facility];  Service: Gastroenterology;  Laterality: N/A;   ESOPHAGOGASTRODUODENOSCOPY (EGD) WITH PROPOFOL N/A 04/04/2021   Procedure: ESOPHAGOGASTRODUODENOSCOPY (EGD) WITH PROPOFOL;  Surgeon: Annamaria Helling, DO;  Location[facility];  Service: Gastroenterology;  Laterality: N/A;  IDDM   MOHS SURGERY Left 05/12/2019   Left nares by [name], MD [place]  2005   TONSILLECTOMY AND ADENOIDECTOMY  1950    Home Medications:  Allergies as of 11/24/2021       Reactions   Chlorthalidone Other (See Comments)   D/c by dr. Juleen China for worsening renal function 03/2019   Spironolactone Other (See Comments)   gynecomastia        Medication List        Accurate as of November 24, 2021  3:09 PM. If you have any questions, ask your nurse or doctor.          acetaminophen 500 MG tablet Commonly known as: TYLENOL Take 500 mg by mouth every 6 (six) hours as needed.   albuterol (  2.5 MG/3ML) 0.083% nebulizer solution Commonly known as: PROVENTIL Take 3 mLs (2.5 mg total) by nebulization every 4 (four) hours. And PRN What changed:  when to take this reasons to take this additional instructions   amLODipine 10 MG tablet Commonly known as: NORVASC Take 1 tablet (10 mg total) by mouth daily.   aspirin 81 MG tablet Take 81 mg by mouth daily.   atorvastatin 80 MG tablet Commonly known as: LIPITOR TAKE 1 TABLET BY MOUTH NIGHTLY   Basaglar KwikPen 100  UNIT/ML Inject 21 Units into the skin daily. [place] patient assistance through Dec 2023   BD Pen Needle Nano U/F 32G X 4 MM Misc Generic drug: Insulin Pen Needle USE DAILY AS DIRECTED   carvedilol 25 MG tablet Commonly known as: COREG TAKE 1 TABLET BY MOUTH TWICE DAILY   cetirizine 10 MG tablet Commonly known as: ZYRTEC Take 10 mg by mouth daily.   cloNIDine 0.2 MG tablet Commonly known as: CATAPRES TAKE 1 TABLET BY MOUTH TWICE DAILY   dapagliflozin propanediol 10 MG Tabs tablet Commonly known as: [name] Take 10 mg by mouth daily.   erythromycin ophthalmic ointment Apply 1 a small amount into right eye every night   finasteride 5 MG tablet Commonly known as: PROSCAR TAKE ONE TABLET EVERY DAY   FreeStyle Libre 2 Reader Kerrin Mo USE TO MONITOR BLOOD SUGARS AS DIRECTED   FreeStyle Libre 2 Sensor Misc Place 1 sensor on the skin every 14 days to monitor blood sugars continuously   furosemide 40 MG tablet Commonly known as: LASIX Take 40 mg by mouth 2 (two) times daily.   gabapentin 600 MG tablet Commonly known as: NEURONTIN TAKE 1 TABLET BY MOUTH 2 TIMES DAILY   hydrALAZINE 100 MG tablet Commonly known as: APRESOLINE TAKE 1 TABLET BY MOUTH 3 TIMES DAILY   insulin lispro 100 UNIT/ML KwikPen Commonly known as: HumaLOG KwikPen Inject 6-10 Units into the skin 3 (three) times daily. Sliding scale. [place] patient assistance through Dec 2023   isosorbide mononitrate 60 MG 24 hr tablet Commonly known as: IMDUR TAKE 1 TABLET BY MOUTH DAILY   levothyroxine 125 MCG tablet Commonly known as: SYNTHROID TAKE 1 TABLET EVERY DAY ON EMPTY STOMACHWITH A GLASS OF WATER AT LEAST 30-60 MINBEFORE BREAKFAST   losartan 100 MG tablet Commonly known as: COZAAR Take 1 tablet (100 mg total) by mouth daily.   Lysine 500 MG Caps Take 1 capsule by mouth daily.   OneTouch Ultra test strip Generic drug: glucose blood USE AS DIRECTED THREE TIMES A DAY   pantoprazole 40  MG tablet Commonly known as: PROTONIX Take 1 tablet (40 mg total) by mouth daily before breakfast.   senna 8.6 MG tablet Commonly known as: SENOKOT Take by mouth.   SENNA PO Take 1 capsule by mouth in the morning, at noon, in the evening, and at bedtime.   Symbicort 80-4.5 MCG/ACT inhaler Generic drug: budesonide-formoterol INHALE 2 PUFFS INTO LUNGS TWICE DAILY   terazosin 5 MG capsule Commonly known as: HYTRIN Take 1 capsule (5 mg total) by mouth daily. TAKE IN PLACE OF DOXAZOSIN   Trulicity 1.5 TS/1.7BL Sopn Generic drug: Dulaglutide Inject 1.5 mg into the skin once a week. [place] patient assistance through Dec 2023   VITAMIN B 12 PO Take 1,000 mcg by mouth daily.        Allergies:  Allergies  Allergen Reactions   Chlorthalidone Other (See Comments)    D/c by dr. Juleen China for  worsening renal function 03/2019   Spironolactone Other (See Comments)    gynecomastia    Family History: Family History  Problem Relation Age of Onset   Emphysema Father    Cancer - Lung Father    Cancer - Ovarian Mother        75's   Breast cancer Mother        late 21's    Social History:  reports that he quit smoking about 33 years ago. His smoking use included cigarettes. He has a 25.00 pack-year smoking history. His smokeless tobacco use includes chew. He reports that he does not drink alcohol and does not use drugs.   Physical Exam: BP (!) 160/63   Pulse 62   Ht '5\' 8"'$  (1.727 m)   Wt 240 lb (108.9 kg)   BMI 36.49 kg/m   Constitutional:  Alert, No acute distress. HEENT: [place] AT, moist mucus membranes.  Trachea midline, no masses. Cardiovascular: No clubbing, cyanosis, or edema. Respiratory: Normal respiratory effort, no increased work of breathing. Psychiatric: Normal mood and affect.   Assessment & Plan:    1. Benign prostatic hyperplasia with LUTS Stable voiding symptoms on combination therapy Finasteride was refilled Bladder scan PVR 100 mL Continue annual  follow-up  2.  Prostate cancer screening We reviewed current prostate cancer screening recommendations of PSA/DRE between the ages of 63-69.  He wants to continue screening until at least age 48 and stated he would request his PSA be drawn with his next blood work with Dr. Silvano Bilis, [place] [address] [place], [place] [address] [phone]

## 2021-11-25 ENCOUNTER — Encounter: Payer: Self-pay | Admitting: Urology

## 2021-11-25 MED ORDER — FINASTERIDE 5 MG PO TABS: 5.0000 mg | ORAL_TABLET | Freq: Every day | ORAL | 11 refills | Status: DC

## 2021-11-30 ENCOUNTER — Other Ambulatory Visit: Payer: Self-pay | Admitting: Family Medicine

## 2021-11-30 DIAGNOSIS — I10 Essential (primary) hypertension: Secondary | ICD-10-CM

## 2021-11-30 MED ORDER — HYDRALAZINE HCL 100 MG PO TABS: 100.0000 mg | ORAL_TABLET | Freq: Three times a day (TID) | ORAL | 4 refills | Status: DC

## 2021-12-08 DIAGNOSIS — J45909 Unspecified asthma, uncomplicated: Secondary | ICD-10-CM | POA: Diagnosis not present

## 2021-12-08 DIAGNOSIS — G4733 Obstructive sleep apnea (adult) (pediatric): Secondary | ICD-10-CM | POA: Diagnosis not present

## 2021-12-08 NOTE — Progress Notes (Signed)
I,Roshena L Chambers,acting as a scribe for Lelon Huh, MD.,have documented all relevant documentation on the behalf of Lelon Huh, MD,as directed by  Lelon Huh, MD while in the presence of Lelon Huh, MD.   Established patient visit   Patient: Jacob Herrera   DOB: March 22, 1951   71 y.o. Male  MRN: [id number] Visit Date: 12/09/2021  Today's healthcare provider: Lelon Huh, MD   Chief Complaint  Patient presents with   Diabetes   Hypothyroidism   Hypertension   Subjective    HPI  Diabetes Mellitus Type II, Follow-up  Lab Results  Component Value Date   HGBA1C 5.4 05/13/2021   HGBA1C 6.2 (H) 11/12/2020   HGBA1C 6.3 (A) 11/12/2020   Wt Readings from Last 3 Encounters:  12/09/21 248 lb (112.5 kg)  11/24/21 240 lb (108.9 kg)  08/23/21 249 lb (112.9 kg)   Last seen for diabetes 3 months ago.  Management since then includes continuing same medication. He reports good compliance with treatment. He is not having side effects.  Symptoms: Yes fatigue No foot ulcerations  No appetite changes No nausea  No paresthesia of the feet  No polydipsia  Yes polyuria No visual disturbances   No vomiting     Home blood sugar records: random blood sugar average 135  Episodes of hypoglycemia? Yes occasionally   Current insulin regiment: Basaglar 21 units daily. Humalog sliding scale. Most Recent Eye Exam: 01/24/2021 Current exercise: none Current diet habits: well balanced  Pertinent Labs: Lab Results  Component Value Date   CHOL 119 11/12/2020   HDL 33 (L) 11/12/2020   LDLCALC 52 11/12/2020   TRIG 209 (H) 11/12/2020   CHOLHDL 3.6 11/12/2020   Lab Results  Component Value Date   NA 140 08/23/2021   K 4.7 08/23/2021   CREATININE 2.0 (A) 08/23/2021   EGFR 22 08/23/2021   MICROALBUR 92 08/23/2021     ---------------------------------------------------------------------------------------------------   Hypothyroid, follow-up  Lab Results  Component  Value Date   TSH 3.380 11/12/2020   TSH 2.720 05/26/2019   TSH 3.480 12/21/2016   FREET4 1.30 11/12/2020    Wt Readings from Last 3 Encounters:  12/09/21 248 lb (112.5 kg)  11/24/21 240 lb (108.9 kg)  08/23/21 249 lb (112.9 kg)    He was last seen for hypothyroid 3 months ago.  Management since that visit includes continue same dose of medication. He reports good compliance with treatment. He is not having side effects.   Symptoms: Yes change in energy level No constipation  No diarrhea No heat / cold intolerance  No nervousness No palpitations  No weight changes    -----------------------------------------------------------------------------------------   Resistant Hypertension, follow-up  BP Readings from Last 3 Encounters:  12/09/21 (!) 145/63  11/24/21 (!) 160/63  08/23/21 (!) 120/55   Wt Readings from Last 3 Encounters:  12/09/21 248 lb (112.5 kg)  11/24/21 240 lb (108.9 kg)  08/23/21 249 lb (112.9 kg)     He was last seen for hypertension 3 months ago.  BP at that visit was 120/55. Management since that visit includes continue same medications and routine follow up with nephrology.  He reports good compliance with treatment. He is not having side effects.  He is following a Regular diet. He is not exercising. He does not smoke.  Use of agents associated with hypertension: NSAIDS and thyroid hormones.   Outside blood pressures are not checked. ---------------------------------------------------------------------------------------------------  Patient would like to come off statin medication due to  chronic joint pains which he feels may be related to taking a statin. He is also asking about CoQ10  Medications: Outpatient Medications Prior to Visit  Medication Sig   acetaminophen (TYLENOL) 500 MG tablet Take 500 mg by mouth every 6 (six) hours as needed.   albuterol (PROVENTIL) (2.5 MG/3ML) 0.083% nebulizer solution Take 3 mLs (2.5 mg total) by nebulization  every 4 (four) hours. And PRN (Patient taking differently: Take 2.5 mg by nebulization as needed.)   amLODipine (NORVASC) 10 MG tablet Take 1 tablet (10 mg total) by mouth daily.   aspirin 81 MG tablet Take 81 mg by mouth daily.   atorvastatin (LIPITOR) 80 MG tablet TAKE 1 TABLET BY MOUTH NIGHTLY   carvedilol (COREG) 25 MG tablet TAKE 1 TABLET BY MOUTH TWICE DAILY   cetirizine (ZYRTEC) 10 MG tablet Take 10 mg by mouth daily.   cloNIDine (CATAPRES) 0.2 MG tablet TAKE 1 TABLET BY MOUTH TWICE DAILY   Continuous Blood Gluc Receiver (FREESTYLE LIBRE 2 READER) DEVI USE TO MONITOR BLOOD SUGARS AS DIRECTED   Continuous Blood Gluc Sensor (FREESTYLE LIBRE 2 SENSOR) MISC Place 1 sensor on the skin every 14 days to monitor blood sugars continuously   Cyanocobalamin (VITAMIN B 12 PO) Take 1,000 mcg by mouth daily.    dapagliflozin propanediol ([name]) 10 MG TABS tablet Take 10 mg by mouth daily.   Dulaglutide (TRULICITY) 1.5 VH/8.4ON SOPN Inject 1.5 mg into the skin once a week. [place] patient assistance through Dec 2023   erythromycin ophthalmic ointment Apply 1 a small amount into right eye every night   finasteride (PROSCAR) 5 MG tablet Take 1 tablet (5 mg total) by mouth daily.   furosemide (LASIX) 40 MG tablet Take 40 mg by mouth 2 (two) times daily.    gabapentin (NEURONTIN) 600 MG tablet TAKE 1 TABLET BY MOUTH 2 TIMES DAILY   hydrALAZINE (APRESOLINE) 100 MG tablet Take 1 tablet (100 mg total) by mouth 3 (three) times daily.   Insulin Glargine (BASAGLAR KWIKPEN) 100 UNIT/ML Inject 21 Units into the skin daily. [place] patient assistance through Dec 2023   insulin lispro (HUMALOG KWIKPEN) 100 UNIT/ML KwikPen Inject 6-10 Units into the skin 3 (three) times daily. Sliding scale. [place] patient assistance through Dec 2023   Insulin Pen Needle (BD PEN NEEDLE NANO U/F) 32G X 4 MM MISC USE DAILY AS DIRECTED   isosorbide mononitrate (IMDUR) 60 MG 24 hr tablet TAKE 1 TABLET BY MOUTH  DAILY   levothyroxine (SYNTHROID) 125 MCG tablet TAKE 1 TABLET EVERY DAY ON EMPTY STOMACHWITH A GLASS OF WATER AT LEAST 30-60 MINBEFORE BREAKFAST   losartan (COZAAR) 100 MG tablet Take 1 tablet (100 mg total) by mouth daily.   Lysine 500 MG CAPS Take 1 capsule by mouth daily.   ONETOUCH ULTRA test strip USE AS DIRECTED THREE TIMES A DAY   pantoprazole (PROTONIX) 40 MG tablet Take 1 tablet (40 mg total) by mouth daily before breakfast.   senna (SENOKOT) 8.6 MG tablet Take by mouth.   SENNA PO Take 1 capsule by mouth in the morning, at noon, in the evening, and at bedtime.   SYMBICORT 80-4.5 MCG/ACT inhaler INHALE 2 PUFFS INTO LUNGS TWICE DAILY   terazosin (HYTRIN) 5 MG capsule Take 1 capsule (5 mg total) by mouth daily. TAKE IN PLACE OF DOXAZOSIN   No facility-administered medications prior to visit.    Review of Systems  Constitutional:  Positive for fatigue. Negative for appetite change,  chills and fever.  Eyes:  Positive for pain (burning pain in eyes).  Respiratory:  Negative for chest tightness, shortness of breath and wheezing.   Cardiovascular:  Negative for chest pain and palpitations.  Gastrointestinal:  Negative for abdominal pain, nausea and vomiting.  Endocrine: Positive for polyuria. Negative for polydipsia and polyphagia.  Musculoskeletal:  Positive for arthralgias and back pain.       Objective    BP (!) 145/63 (BP Location: Left Arm, Patient Position: Sitting, Cuff Size: Large)   Pulse (!) 58   Temp 98.1 F (36.7 C) (Oral)   Resp 18   Wt 248 lb (112.5 kg)   SpO2 97% Comment: room air  BMI 37.71 kg/m    Today's Vitals   12/09/21 1433 12/09/21 1445  BP: (!) 143/61 (!) 145/63  Pulse: (!) 58   Resp: 18   Temp: 98.1 F (36.7 C)   TempSrc: Oral   SpO2: 97%   Weight: 248 lb (112.5 kg)    Body mass index is 37.71 kg/m.    Physical Exam   General: Appearance:    Mildly obese male in no acute distress  Eyes:    PERRL, conjunctiva/corneas clear, EOM's  intact       Lungs:     Clear to auscultation bilaterally, respirations unlabored  Heart:    Bradycardic. Normal rhythm. No murmurs, rubs, or gallops.    MS:   All extremities are intact.    Neurologic:   Awake, alert, oriented x 3. No apparent focal neurological defect.         Assessment & Plan     1. Resistant hypertension Fairly well controlled on current regiment, primary managed by nephrology.   2. Hypothyroidism, unspecified type Due to check - TSH + free T4  3. Diabetes mellitus with nephropathy (HCC)  - Hemoglobin A1c  4. Secondary hyperparathyroidism of renal origin [facility]) Will send copy of labs to Dr Candiss Norse before upcoming appt.   5. Hyperlipidemia, unspecified hyperlipidemia type Currently on atorvastatin which he feels is contributing to joint pains. He is going to try CoQ10 which I advised primary helps with muscle pains. Consider change to rosuvastatin .  - CBC - Comprehensive metabolic panel - Lipid panel  6. Vitamin D deficiency  - VITAMIN D 25 Hydroxy (Vit-D Deficiency, Fractures)  7. Vitamin B12 deficiency  - Vitamin B12  8. Chronic diastolic heart failure ([place]) Well compensated on current medication regiment. Continue routine follow up [facility]   9. Morbid obesity ([place]) Encouraged healthy diet regular physical activity as toleratd.   10. Prostate cancer screening  - PSA Total (Reflex To Free) ([facility] only)       The entirety of the information documented in the History of Present Illness, Review of Systems and Physical Exam were personally obtained by me. Portions of this information were initially documented by the CMA and reviewed by me for thoroughness and accuracy.     Lelon Huh, MD  [facility] [phone] (phone) [phone] (fax)  [place]

## 2021-12-09 ENCOUNTER — Encounter: Payer: Self-pay | Admitting: Family Medicine

## 2021-12-09 ENCOUNTER — Ambulatory Visit (INDEPENDENT_AMBULATORY_CARE_PROVIDER_SITE_OTHER): Payer: PPO | Admitting: Family Medicine

## 2021-12-09 VITALS — BP 145/63 | HR 58 | Temp 98.1°F | Resp 18 | Wt 248.0 lb

## 2021-12-09 DIAGNOSIS — E785 Hyperlipidemia, unspecified: Secondary | ICD-10-CM

## 2021-12-09 DIAGNOSIS — I10 Essential (primary) hypertension: Secondary | ICD-10-CM

## 2021-12-09 DIAGNOSIS — N2581 Secondary hyperparathyroidism of renal origin: Secondary | ICD-10-CM

## 2021-12-09 DIAGNOSIS — E559 Vitamin D deficiency, unspecified: Secondary | ICD-10-CM

## 2021-12-09 DIAGNOSIS — I5032 Chronic diastolic (congestive) heart failure: Secondary | ICD-10-CM | POA: Diagnosis not present

## 2021-12-09 DIAGNOSIS — E039 Hypothyroidism, unspecified: Secondary | ICD-10-CM | POA: Diagnosis not present

## 2021-12-09 DIAGNOSIS — E538 Deficiency of other specified B group vitamins: Secondary | ICD-10-CM

## 2021-12-09 DIAGNOSIS — I1A0 Resistant hypertension: Secondary | ICD-10-CM

## 2021-12-09 DIAGNOSIS — Z125 Encounter for screening for malignant neoplasm of prostate: Secondary | ICD-10-CM | POA: Diagnosis not present

## 2021-12-09 DIAGNOSIS — E1121 Type 2 diabetes mellitus with diabetic nephropathy: Secondary | ICD-10-CM | POA: Diagnosis not present

## 2021-12-10 LAB — COMPREHENSIVE METABOLIC PANEL
ALT: 23 IU/L (ref 0–44)
AST: 24 IU/L (ref 0–40)
Albumin/Globulin Ratio: 1.5 (ref 1.2–2.2)
Albumin: 4.4 g/dL (ref 3.8–4.8)
Alkaline Phosphatase: 62 IU/L (ref 44–121)
BUN/Creatinine Ratio: 16 (ref 10–24)
BUN: 35 mg/dL — ABNORMAL HIGH (ref 8–27)
Bilirubin Total: 0.3 mg/dL (ref 0.0–1.2)
CO2: 19 mmol/L — ABNORMAL LOW (ref 20–29)
Calcium: 10.3 mg/dL — ABNORMAL HIGH (ref 8.6–10.2)
Chloride: 103 mmol/L (ref 96–106)
Creatinine, Ser: 2.19 mg/dL — ABNORMAL HIGH (ref 0.76–1.27)
Globulin, Total: 2.9 g/dL (ref 1.5–4.5)
Glucose: 102 mg/dL — ABNORMAL HIGH (ref 70–99)
Potassium: 4.5 mmol/L (ref 3.5–5.2)
Sodium: 139 mmol/L (ref 134–144)
Total Protein: 7.3 g/dL (ref 6.0–8.5)
eGFR: 31 mL/min/{1.73_m2} — ABNORMAL LOW (ref >59)

## 2021-12-10 LAB — CBC
Hematocrit: 34.8 % — ABNORMAL LOW (ref 37.5–51.0)
Hemoglobin: 11.2 g/dL — ABNORMAL LOW (ref 13.0–17.7)
MCH: 30 pg (ref 26.6–33.0)
MCHC: 32.2 g/dL (ref 31.5–35.7)
MCV: 93 fL (ref 79–97)
Platelets: 239 10*3/uL (ref 150–450)
RBC: 3.73 x10E6/uL — ABNORMAL LOW (ref 4.14–5.80)
RDW: 12.7 % (ref 11.6–15.4)
WBC: 5.9 10*3/uL (ref 3.4–10.8)

## 2021-12-10 LAB — LIPID PANEL
Chol/HDL Ratio: 4.5 ratio (ref 0.0–5.0)
Cholesterol, Total: 144 mg/dL (ref 100–199)
HDL: 32 mg/dL — ABNORMAL LOW (ref >39)
LDL Chol Calc (NIH): 61 mg/dL (ref 0–99)
Triglycerides: 325 mg/dL — ABNORMAL HIGH (ref 0–149)
VLDL Cholesterol Cal: 51 mg/dL — ABNORMAL HIGH (ref 5–40)

## 2021-12-10 LAB — VITAMIN B12: Vitamin B-12: 1012 pg/mL (ref 232–1245)

## 2021-12-10 LAB — HEMOGLOBIN A1C
Est. average glucose Bld gHb Est-mCnc: 131 mg/dL
Hgb A1c MFr Bld: 6.2 % — ABNORMAL HIGH (ref 4.8–5.6)

## 2021-12-10 LAB — VITAMIN D 25 HYDROXY (VIT D DEFICIENCY, FRACTURES): Vit D, 25-Hydroxy: 35.8 ng/mL (ref 30.0–100.0)

## 2021-12-10 LAB — PSA TOTAL (REFLEX TO FREE): Prostate Specific Ag, Serum: 1.9 ng/mL (ref 0.0–4.0)

## 2021-12-10 LAB — TSH+FREE T4
Free T4: 1.34 ng/dL (ref 0.82–1.77)
TSH: 1.95 u[IU]/mL (ref 0.450–4.500)

## 2021-12-11 ENCOUNTER — Other Ambulatory Visit: Payer: Self-pay | Admitting: Family Medicine

## 2021-12-11 DIAGNOSIS — I25110 Atherosclerotic heart disease of native coronary artery with unstable angina pectoris: Secondary | ICD-10-CM

## 2021-12-11 DIAGNOSIS — E785 Hyperlipidemia, unspecified: Secondary | ICD-10-CM

## 2021-12-11 MED ORDER — ROSUVASTATIN CALCIUM 20 MG PO TABS: 20.0000 mg | ORAL_TABLET | Freq: Every day | ORAL | 3 refills | Status: DC

## 2021-12-17 IMAGING — CR DG CHEST 2V
2 series · 2 of 2 positions shown · non-contrast
Comparison: September 10, 2015

CLINICAL DATA: Shortness of breath

EXAM:
CHEST - 2 VIEW

[chest pa]
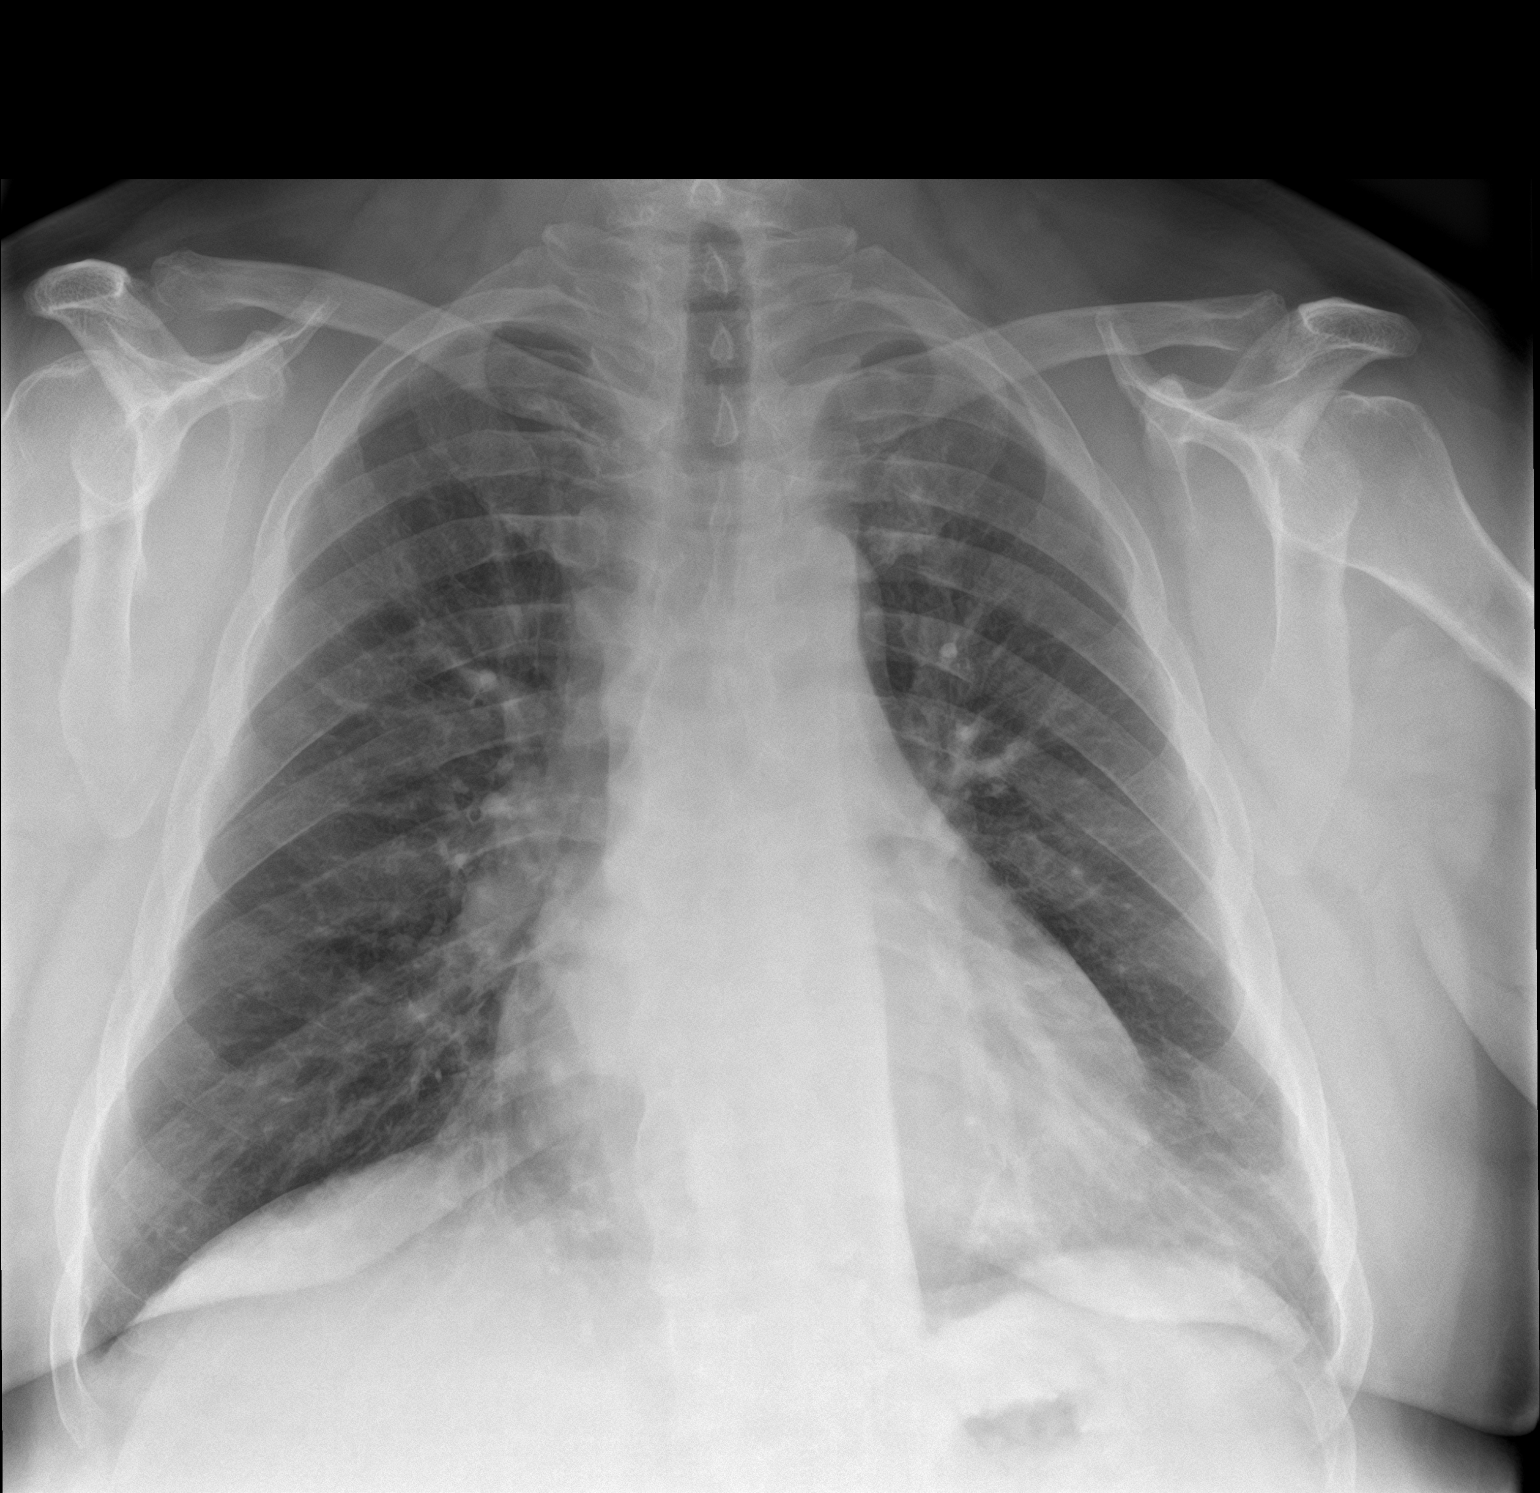

[chest lat]
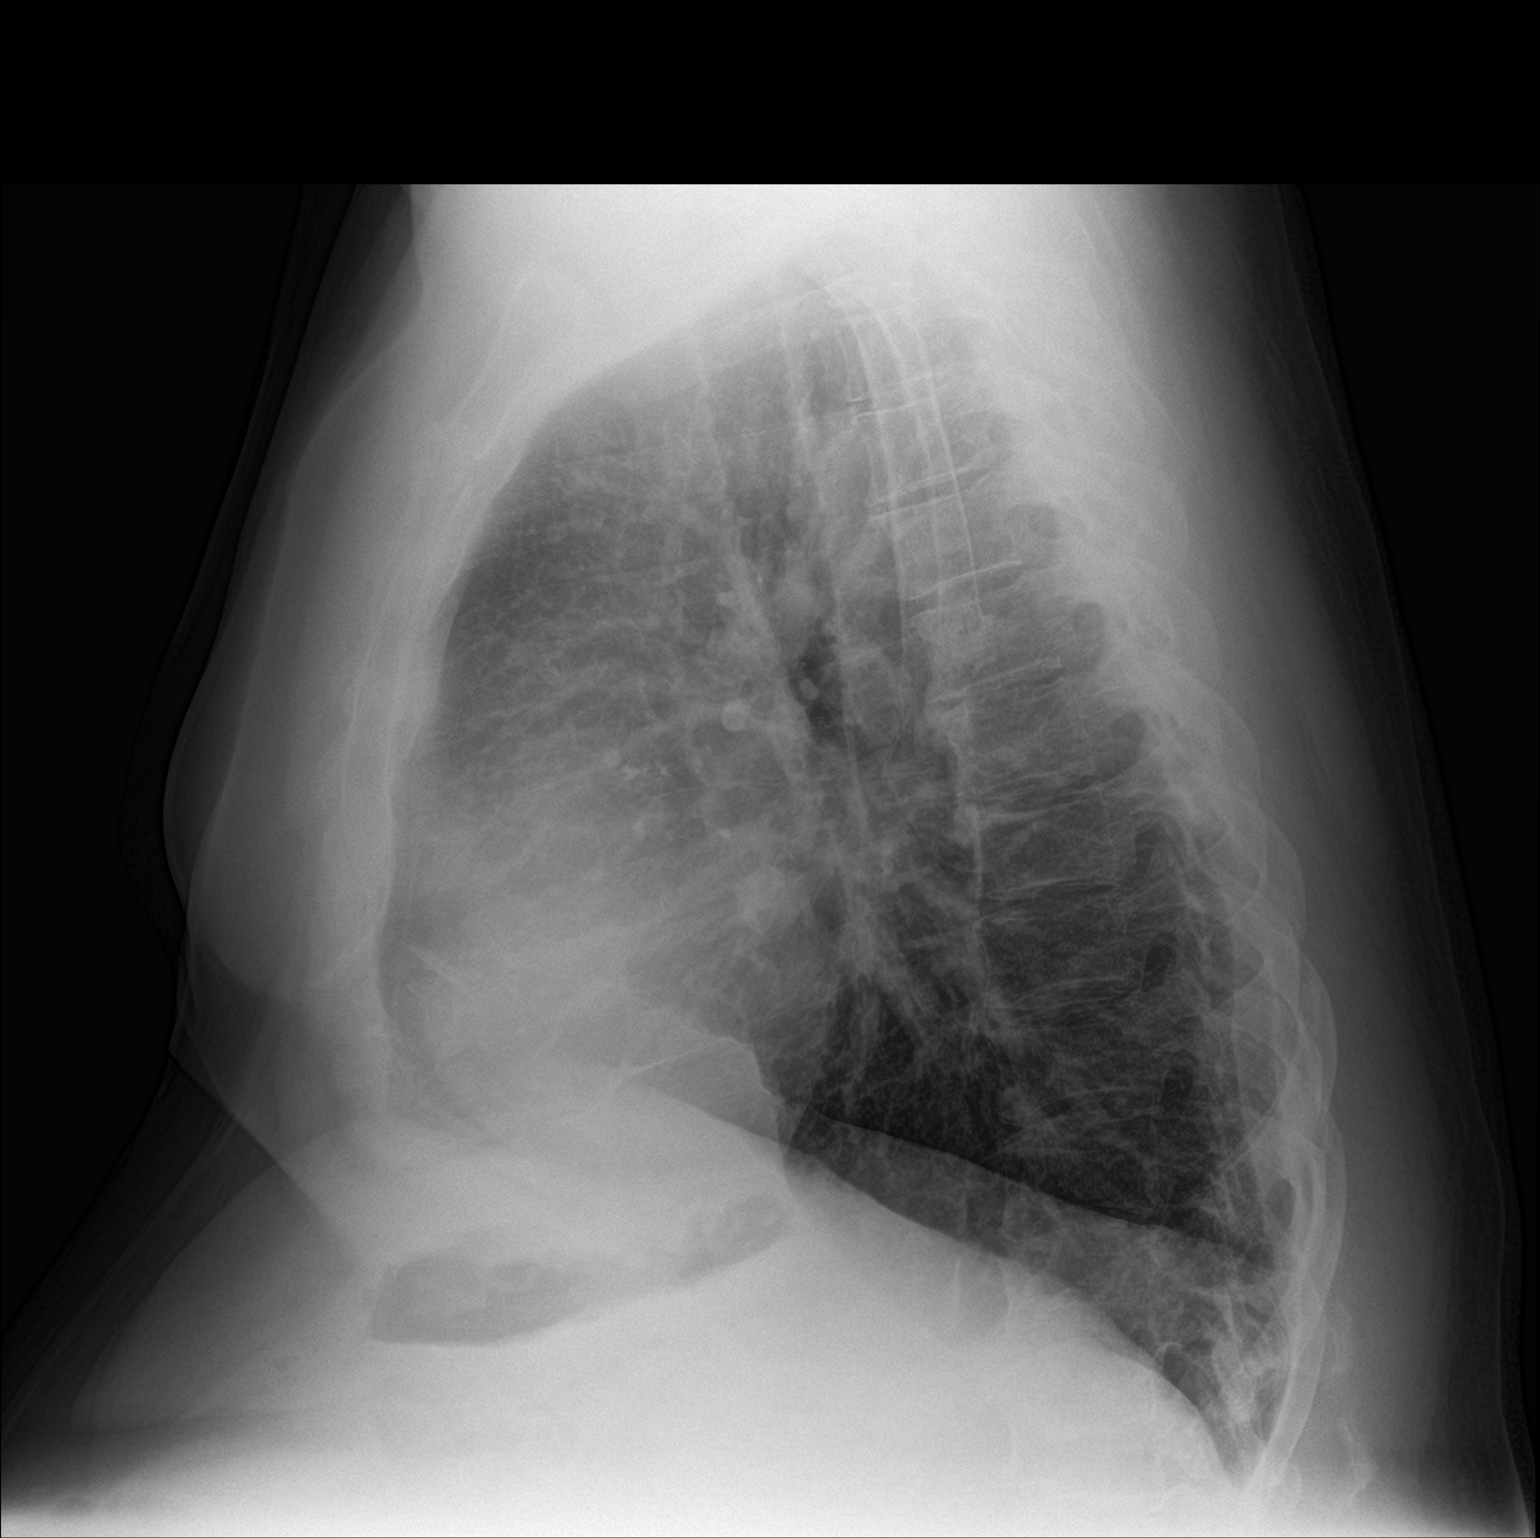

[2 of 2 positions shown; findings below may reference images not displayed]

FINDINGS: Lungs are clear. Heart is borderline enlarged with pulmonary
vascularity normal. No adenopathy. No bone lesions.
IMPRESSION: Lungs clear.  Borderline cardiac enlargement.

## 2021-12-25 ENCOUNTER — Other Ambulatory Visit: Payer: Self-pay | Admitting: Family Medicine

## 2021-12-25 DIAGNOSIS — I25110 Atherosclerotic heart disease of native coronary artery with unstable angina pectoris: Secondary | ICD-10-CM

## 2021-12-25 DIAGNOSIS — E785 Hyperlipidemia, unspecified: Secondary | ICD-10-CM

## 2021-12-25 MED ORDER — ROSUVASTATIN CALCIUM 20 MG PO TABS: 20.0000 mg | ORAL_TABLET | Freq: Every day | ORAL | 3 refills | Status: DC

## 2021-12-27 DIAGNOSIS — N184 Chronic kidney disease, stage 4 (severe): Secondary | ICD-10-CM | POA: Diagnosis not present

## 2021-12-27 DIAGNOSIS — N041 Nephrotic syndrome with focal and segmental glomerular lesions: Secondary | ICD-10-CM | POA: Diagnosis not present

## 2021-12-27 DIAGNOSIS — I10 Essential (primary) hypertension: Secondary | ICD-10-CM | POA: Diagnosis not present

## 2021-12-27 DIAGNOSIS — E1122 Type 2 diabetes mellitus with diabetic chronic kidney disease: Secondary | ICD-10-CM | POA: Diagnosis not present

## 2021-12-27 DIAGNOSIS — N2581 Secondary hyperparathyroidism of renal origin: Secondary | ICD-10-CM | POA: Diagnosis not present

## 2021-12-30 ENCOUNTER — Other Ambulatory Visit: Payer: Self-pay | Admitting: Family Medicine

## 2022-01-02 DIAGNOSIS — N2581 Secondary hyperparathyroidism of renal origin: Secondary | ICD-10-CM | POA: Diagnosis not present

## 2022-01-02 DIAGNOSIS — R600 Localized edema: Secondary | ICD-10-CM | POA: Diagnosis not present

## 2022-01-02 DIAGNOSIS — N184 Chronic kidney disease, stage 4 (severe): Secondary | ICD-10-CM | POA: Diagnosis not present

## 2022-01-02 DIAGNOSIS — I10 Essential (primary) hypertension: Secondary | ICD-10-CM | POA: Diagnosis not present

## 2022-01-02 DIAGNOSIS — E1122 Type 2 diabetes mellitus with diabetic chronic kidney disease: Secondary | ICD-10-CM | POA: Diagnosis not present

## 2022-01-09 DIAGNOSIS — C44602 Unspecified malignant neoplasm of skin of right upper limb, including shoulder: Secondary | ICD-10-CM | POA: Diagnosis not present

## 2022-01-09 DIAGNOSIS — M65341 Trigger finger, right ring finger: Secondary | ICD-10-CM | POA: Diagnosis not present

## 2022-01-25 DIAGNOSIS — C44602 Unspecified malignant neoplasm of skin of right upper limb, including shoulder: Secondary | ICD-10-CM | POA: Diagnosis not present

## 2022-01-26 ENCOUNTER — Telehealth: Payer: Self-pay

## 2022-01-26 DIAGNOSIS — H35373 Puckering of macula, bilateral: Secondary | ICD-10-CM | POA: Diagnosis not present

## 2022-01-26 LAB — HM DIABETES EYE EXAM

## 2022-01-26 NOTE — Progress Notes (Signed)
Chronic Care Management Pharmacy Assistant   Name: Jacob Herrera  MRN: [id number] DOB: [birth year]   Reason for Encounter: Medication Review/Patient assistance renewal for Humalog, Trulicity, and Basaglar.   Recent office visits:  12/09/2021 Dr. Caryn Section MD (PCP) No medication Changes noted  Recent consult visits:  01/09/2022 Hessie Knows (Orthopedic Surgery) Unable to see note 01/02/2022 Dr. Candiss Norse MD (Nephrology) No Medication Changes noted, return in 4 months 11/24/2021 Dr. Bernardo Heater MD (Urology) No Medication Changes noted  Hospital visits:  None in previous 6 months  Medications: Outpatient Encounter Medications as of 01/26/2022  Medication Sig Note   acetaminophen (TYLENOL) 500 MG tablet Take 500 mg by mouth every 6 (six) hours as needed.    albuterol (PROVENTIL) (2.5 MG/3ML) 0.083% nebulizer solution Take 3 mLs (2.5 mg total) by nebulization every 4 (four) hours. And PRN (Patient taking differently: Take 2.5 mg by nebulization as needed.) 05/11/2020: PRN, hasnt had to use in 2-3 months   amLODipine (NORVASC) 10 MG tablet Take 1 tablet (10 mg total) by mouth daily.    aspirin 81 MG tablet Take 81 mg by mouth daily.    carvedilol (COREG) 25 MG tablet TAKE 1 TABLET BY MOUTH TWICE DAILY    cetirizine (ZYRTEC) 10 MG tablet Take 10 mg by mouth daily.    cloNIDine (CATAPRES) 0.2 MG tablet TAKE 1 TABLET BY MOUTH TWICE DAILY    Continuous Blood Gluc Receiver (FREESTYLE LIBRE 2 READER) DEVI USE TO MONITOR BLOOD SUGARS AS DIRECTED    Continuous Blood Gluc Sensor (FREESTYLE LIBRE 2 SENSOR) MISC Place 1 sensor on the skin every 14 days to monitor blood sugars continuously    Cyanocobalamin (VITAMIN B 12 PO) Take 1,000 mcg by mouth daily.     dapagliflozin propanediol ([name]) 10 MG TABS tablet Take 10 mg by mouth daily.    Dulaglutide (TRULICITY) 1.5 NI/7.7OE SOPN Inject 1.5 mg into the skin once a week. [place] patient assistance through Dec 2023    erythromycin ophthalmic  ointment Apply 1 a small amount into right eye every night    finasteride (PROSCAR) 5 MG tablet Take 1 tablet (5 mg total) by mouth daily.    furosemide (LASIX) 40 MG tablet Take 40 mg by mouth 2 (two) times daily.     gabapentin (NEURONTIN) 600 MG tablet TAKE 1 TABLET BY MOUTH 2 TIMES DAILY    hydrALAZINE (APRESOLINE) 100 MG tablet Take 1 tablet (100 mg total) by mouth 3 (three) times daily.    Insulin Glargine (BASAGLAR KWIKPEN) 100 UNIT/ML Inject 21 Units into the skin daily. [place] patient assistance through Dec 2023    insulin lispro (HUMALOG KWIKPEN) 100 UNIT/ML KwikPen Inject 6-10 Units into the skin 3 (three) times daily. Sliding scale. [place] patient assistance through Dec 2023    Insulin Pen Needle (BD PEN NEEDLE NANO U/F) 32G X 4 MM MISC USE DAILY AS DIRECTED    isosorbide mononitrate (IMDUR) 60 MG 24 hr tablet TAKE 1 TABLET BY MOUTH DAILY    levothyroxine (SYNTHROID) 125 MCG tablet TAKE 1 TABLET EVERY DAY ON EMPTY STOMACHWITH A GLASS OF WATER AT LEAST 30-60 MINBEFORE BREAKFAST    losartan (COZAAR) 100 MG tablet Take 1 tablet (100 mg total) by mouth daily.    Lysine 500 MG CAPS Take 1 capsule by mouth daily.    ONETOUCH ULTRA test strip USE AS DIRECTED THREE TIMES A DAY    pantoprazole (PROTONIX) 40 MG tablet Take 1 tablet (40 mg  total) by mouth daily before breakfast.    rosuvastatin (CRESTOR) 20 MG tablet Take 1 tablet (20 mg total) by mouth daily. (TAKE IN PLACE OF ATORVASTATIN AND CANCEL ATORVASTATIN REFILLS)    senna (SENOKOT) 8.6 MG tablet Take by mouth.    SENNA PO Take 1 capsule by mouth in the morning, at noon, in the evening, and at bedtime.    SYMBICORT 80-4.5 MCG/ACT inhaler INHALE 2 PUFFS INTO LUNGS TWICE DAILY 08/15/2021: Takes PRN   terazosin (HYTRIN) 5 MG capsule Take 1 capsule (5 mg total) by mouth daily. TAKE IN PLACE OF DOXAZOSIN    No facility-administered encounter medications on file as of 01/26/2022.    Care Gaps: Tetanus Vaccine COVID-19  Vaccine Foot Exam Influenza Vaccine Ophthalmology Exam  Star Rating Drugs: Atorvastatin 80 mg last filled on 09/23/2021 for 90 day supply at [facility]. Losartan 100 mg last filled on 11/03/2021 [facility] [name] 10 mg receives patient assistance  through [employer] Trulicity 1.5 mg  last filled on 06/21/2020 for 28 day supply at [facility] (Patient assistance through [employer])  Medications Fill Gaps: None   Patient assistance renewal for Humalog, Trulicity, and Basaglar:  I received a task from [name], CPP requesting that I start the renewal application for patient assistance on the medication Humalog, Trulicity, and Basaglar to continue assistance through year 2024.    Spoke with the patient and inform him that the application will be mailed.I informed  him once he receives the application he will need to complete his part of the application and return it to his PCP office for [name], CPP to fax over to the [facility] for processing.Informed patient to include a copy of his proof of income.  Patient verbalized understanding and was provided my phone number of [phone] if he has any questions.   Application emailed to [name], CPP for review and to mail to patient home.  Patient is aware I will reach out to his Nephrologist in November to request a new prescription for Farxgia to be sent to [employer] to continue his assistance for year 2024.  [place] Pharmacist Assistant [phone]

## 2022-01-28 ENCOUNTER — Other Ambulatory Visit: Payer: Self-pay | Admitting: Family Medicine

## 2022-01-28 DIAGNOSIS — I1A0 Resistant hypertension: Secondary | ICD-10-CM

## 2022-02-02 ENCOUNTER — Ambulatory Visit: Payer: PPO | Admitting: Internal Medicine

## 2022-02-02 ENCOUNTER — Encounter: Payer: Self-pay | Admitting: Internal Medicine

## 2022-02-02 VITALS — BP 140/70 | HR 76 | Temp 97.8°F | Ht 68.0 in | Wt 250.0 lb

## 2022-02-02 DIAGNOSIS — G4733 Obstructive sleep apnea (adult) (pediatric): Secondary | ICD-10-CM

## 2022-02-02 NOTE — Patient Instructions (Signed)
Recommend reevaluation of sleep apnea with Dr. Halford Chessman

## 2022-02-02 NOTE — Progress Notes (Signed)
Subjective:    Patient ID: Jacob Herrera, male    DOB: March 26, 1951, 71 y.o.   MRN: [id number]  Synopsis: Jacob Herrera first saw the [facility] pulmonary clinic in February 2015 for evaluation of shortness of breath. He had a past medical history significant for asthma. Pulmonary function testing was normal with the exception of restriction secondary to obesity. He was started on a combination long acting bronchodilator/inhaled corticosteroid inhaler and he noted significant benefit from this.  Compliance reports as listed below 10/2015  100% compliance Leak 117.4L/min AHI 12.9  06/2016 compliance report 100% compliance Leak 118 L/min AHI 9.1  11/18 100% compliance Leak 24 L/min AHI14  6.18.19 AHI 30 100% compliance No leak IPAP 25 EPAP 5  10/19 AHI 10 100% compliance BiPAP IPAP 25 EPAP 12  02/25/2019 AHI of 10 100% compliance   12/2019 AHI 4.8 100% compliance days and >4 hrs BiPAP IPAP 25 EPAP 12 Minimal leak  03/2021  CPAP/BiPAP titration study Recommended 16/10  01/2022 AHI 20 IPAP 25 EPAP 12 100% compliance   CC:  Follow up OSA   HPI  H/o OSA Nonsmoker H/o CHF History of OSA   No exacerbation at this time No evidence of heart failure at this time No evidence or signs of infection at this time No respiratory distress No fevers, chills, nausea, vomiting, diarrhea No evidence of lower extremity edema No evidence hemoptysis  Oxygen as needed with BiPAP Patient needs oxygen to survive  Compliance report reviewed in detail with the patient despite very good compliance report at 100% for days and greater than 4 hours patient is on BiPAP 25/12 AHI remains high at 20 I recommend seeing Jacob Herrera for further evaluation and assessment to see if there is anything that can be done for this patient   BP (!) 140/70 (BP Location: Left Arm, Cuff Size: Large)   Pulse 76   Temp 97.8 F (36.6 C) (Temporal)   Ht '5\' 8"'$  (1.727 m)   Wt 250 lb  (113.4 kg)   SpO2 94%   BMI 38.01 kg/m      Review of Systems: Gen:  Denies  fever, sweats, chills weight loss  HEENT: Denies blurred vision, double vision, ear pain, eye pain, hearing loss, nose bleeds, sore throat Cardiac:  No dizziness, chest pain or heaviness, chest tightness,edema, No JVD Resp:   No cough, -sputum production, -shortness of breath,-wheezing, -hemoptysis,  Other:  All other systems negative    Physical Examination:   General Appearance: No distress  EYES PERRLA, EOM intact.   NECK Supple, No JVD Pulmonary: normal breath sounds, No wheezing.  CardiovascularNormal S1,S2.  No m/r/g.   Abdomen: Benign, Soft, non-tender. ALL OTHER ROS ARE NEGATIVE      05/2013 Full FPT > ratio 82%, FEV1 1.95L (67% pred), no change with BD, TLC 3.91 L (64% pred), ERV 0.26L (18% pred), DLCO 15.6 (54% pred)     Assessment & Plan:      71 year old morbidly obese white male with severe sleep apnea with a history of cardiomyopathy status post stent with underlying reactive airways disease with chronic kidney disease and chronic hypoxic respiratory failure   Severe sleep apnea Patient is very compliant with CPAP He benefits in use from therapy Excellent compliance however AHI is still elevated Recommend reevaluation and assessment with Jacob Herrera AHI remains high at 20 despite very good compliance and CPAP titration study    Chronic shortness of breath and dyspnea exertion related to morbid obesity underlying  sleep apnea reactive airways disease and CHF No indication for prednisone or antibiotics at this time  Cardiomyopathy and hypertension Both are related to underlying sleep apnea and therefore therapy for sleep apnea is essential for treating these diseases   Obesity -recommend significant weight loss -recommend changing diet  Deconditioned state -Recommend increased daily activity and exercise   Chronic Hypoxic resp failure due to COPD -Patient benefits from  oxygen therapy  -recommend using oxygen as prescribed -patient needs this for survival    MEDICATION ADJUSTMENTS/LABS AND TESTS ORDERED: Continue inhaler therapy as prescribed BiPAP as prescribed Follow-up with Jacob Herrera for reassessment   CURRENT MEDICATIONS REVIEWED AT [place]   Patient satisfied with Plan of action and management. All questions answered  Follow-up 1 year  Total time spent 22 minutes    Jacob Herrera Jacob Herrera, M.D.  [facility]  Medical Director [place] Director [facility] Cardio-Pulmonary Department

## 2022-02-03 NOTE — Addendum Note (Signed)
Addended by: Claudette Head A on: 02/03/2022 08:08 AM   Modules accepted: Orders

## 2022-02-07 ENCOUNTER — Ambulatory Visit (INDEPENDENT_AMBULATORY_CARE_PROVIDER_SITE_OTHER): Payer: PPO

## 2022-02-07 DIAGNOSIS — I1A0 Resistant hypertension: Secondary | ICD-10-CM

## 2022-02-07 DIAGNOSIS — E1121 Type 2 diabetes mellitus with diabetic nephropathy: Secondary | ICD-10-CM

## 2022-02-07 NOTE — Progress Notes (Unsigned)
Chronic Care Management Pharmacy Note  02/08/2022 Name:  Makaveli Hoard MRN:  [id number] DOB:  [birth year]  Summary: Patient presents for CCM follow-up.    Recommendations/Changes made from today's visit: Continue current medications  Plan: CPP follow-up 3 months  Subjective: Jacob Herrera is an 71 y.o. year old male who is a primary patient of [name], [name], MD.  The CCM team was consulted for assistance with disease management and care coordination needs.    Engaged with patient face to face for follow up visit in response to provider referral for pharmacy case management and/or care coordination services.   Consent to Services:  The patient was given information about Chronic Care Management services, agreed to services, and gave verbal consent prior to initiation of services.  Please see initial visit note for detailed documentation.   Patient Care Team: [name], MD as PCP - General (Family Medicine) [name], MD as Consulting Physician (Nephrology) [name], MD as Referring Physician (Dermatology) [name], MD (Urology) [name], [facility] (Pharmacist) Annamaria Helling, DO as Consulting Physician (Gastroenterology) [name], MD as Consulting Physician (Cardiology)  Recent office visits: 12/09/21: Patient presented to Dr. Caryn Section for follow-up. Atorvastatin cahnged to rosuvastatin.  08/23/21: Patient presented to Dr. Caryn Section for follow-up. BP 120/55.  04/27/21: patient presented to Dr. Caryn Section for URI. Azithromycin.  11/12/20: patient presented to Dr. Caryn Section for follow-up. A1c 6.1%.    Recent consult visits: 02/02/22: patient presented to Dr. Mortimer Fries (pulmonology).  01/02/22: Patient presented to Dr. Candiss Norse (nephrology). 08/29/21: Patient presented to Dr. Candiss Norse (nephrology). Stop Invokana, [name].  05/24/21: Patient presented to Dr. Juleen China (nephrology), decrease furosemide 40 mg daily.   Hospital visits: None in past 6  months   Objective:  Lab Results  Component Value Date   CREATININE 2.19 (H) 12/09/2021   BUN 35 (H) 12/09/2021   GFRNONAA 33 11/08/2020   GFRAA 42 (L) 10/07/2018   NA 139 12/09/2021   K 4.5 12/09/2021   CALCIUM 10.3 (H) 12/09/2021   CO2 19 (L) 12/09/2021   GLUCOSE 102 (H) 12/09/2021    Lab Results  Component Value Date/Time   HGBA1C 6.2 (H) 12/09/2021 03:24 PM   HGBA1C 5.4 05/13/2021 03:23 PM   HGBA1C 6.2 (H) 11/12/2020 02:14 PM   HGBA1C 6.3 (A) 11/12/2020 01:50 PM   MICROALBUR 92 08/23/2021 12:00 AM   MICROALBUR neg 03/20/2017 02:25 PM    Last diabetic Eye exam:  Lab Results  Component Value Date/Time   HMDIABEYEEXA No Retinopathy 01/24/2021 12:00 AM    Last diabetic Foot exam: No results found for: "HMDIABFOOTEX"   Lab Results  Component Value Date   CHOL 144 12/09/2021   HDL 32 (L) 12/09/2021   LDLCALC 61 12/09/2021   TRIG 325 (H) 12/09/2021   CHOLHDL 4.5 12/09/2021       Latest Ref Rng & Units 12/09/2021    3:24 PM 11/08/2020   12:00 AM 10/07/2018    8:25 AM  Hepatic Function  Total Protein 6.0 - 8.5 g/dL 7.3   6.6   Albumin 3.8 - 4.8 g/dL 4.4  3.9     4.2   AST 0 - 40 IU/L 24   23   ALT 0 - 44 IU/L 23   17   Alk Phosphatase 44 - 121 IU/L 62   47   Total Bilirubin 0.0 - 1.2 mg/dL 0.3   0.2      This result is from an external source.  Lab Results  Component Value Date/Time   TSH 1.950 12/09/2021 03:24 PM   TSH 3.380 11/12/2020 02:14 PM   FREET4 1.34 12/09/2021 03:24 PM   FREET4 1.30 11/12/2020 02:14 PM       Latest Ref Rng & Units 12/09/2021    3:24 PM 11/08/2020   12:00 AM 11/09/2014   10:30 AM  CBC  WBC 3.4 - 10.8 x10E3/uL 5.9  6.1     6.6   Hemoglobin 13.0 - 17.7 g/dL 11.2  10.7     12.4   Hematocrit 37.5 - 51.0 % 34.8  32     38.9   Platelets 150 - 450 x10E3/uL 239  249     241      This result is from an external source.    Lab Results  Component Value Date/Time   VD25OH 35.8 12/09/2021 03:24 PM   VD25OH 22.8 (L) 05/26/2019  04:16 PM    Clinical ASCVD: No  The 10-year ASCVD risk score (Arnett DK, et al., 2019) is: 54.4%   Values used to calculate the score:     Age: 19 years     Sex: Male     Is Non-Hispanic African American: No     Diabetic: Yes     Tobacco smoker: Yes     Systolic Blood Pressure: 032 mmHg     Is BP treated: Yes     HDL Cholesterol: 32 mg/dL     Total Cholesterol: 144 mg/dL       05/13/2021    2:58 PM 11/12/2020    1:45 PM 03/15/2020    3:36 PM  Depression screen PHQ 2/9  Decreased Interest 0 0 0  Down, Depressed, Hopeless 0 0 0  PHQ - 2 Score 0 0 0  Altered sleeping 0 1 0  Tired, decreased energy 1 2 0  Change in appetite 0 0 0  Feeling bad or failure about yourself  0 0 0  Trouble concentrating 0 0 0  Moving slowly or fidgety/restless 0 0 0  Suicidal thoughts 0 0 0  PHQ-9 Score 1 3 0  Difficult doing work/chores Not difficult at all Not difficult at all Not difficult at all    Social History   Tobacco Use  Smoking Status Former   Packs/day: 1.00   Years: 25.00   Total pack years: 25.00   Types: Cigarettes   Quit date: 05/29/1988   Years since quitting: 33.7  Smokeless Tobacco Current   Types: Chew  Tobacco Comments   chews 1 bag of loose leaf chew/week   BP Readings from Last 3 Encounters:  02/02/22 (!) 140/70  12/09/21 (!) 145/63  11/24/21 (!) 160/63   Pulse Readings from Last 3 Encounters:  02/02/22 76  12/09/21 (!) 58  11/24/21 62   Wt Readings from Last 3 Encounters:  02/02/22 250 lb (113.4 kg)  12/09/21 248 lb (112.5 kg)  11/24/21 240 lb (108.9 kg)   BMI Readings from Last 3 Encounters:  02/02/22 38.01 kg/m  12/09/21 37.71 kg/m  11/24/21 36.49 kg/m    Assessment/Interventions: Review of patient past medical history, allergies, medications, health status, including review of consultants reports, laboratory and other test data, was performed as part of comprehensive evaluation and provision of chronic care management services.   SDOH:  (Social  Determinants of Health) assessments and interventions performed: Yes SDOH Interventions    Flowsheet Row Chronic Care Management from 05/09/2021 in [facility] Most recent reading at 05/24/2021 11:46 AM Office Visit  from 05/13/2021 in [facility] Most recent reading at 05/13/2021  2:58 PM Chronic Care Management from 03/29/2021 in [facility] Most recent reading at 03/29/2021  2:26 PM Chronic Care Management from 01/04/2021 in [facility] Most recent reading at 01/04/2021  2:19 PM Office Visit from 11/12/2020 in [facility] Most recent reading at 11/12/2020  1:45 PM Chronic Care Management from 11/17/2019 in [facility] Most recent reading at 11/19/2019  7:13 PM  SDOH Interventions        Depression Interventions/Treatment  -- EZM6-2 Score <4 Follow-up Not Indicated -- -- PHQ2-9 Score <4 Follow-up Not Indicated --  Financial Strain Interventions Intervention Not Indicated -- Other (Comment)  [PAP] Intervention Not Indicated -- Other (Comment)  [Patient Assistance Program Applications]          CCM Care Plan  Allergies  Allergen Reactions   Chlorthalidone Other (See Comments)    D/c by dr. Juleen China for worsening renal function 03/2019   Spironolactone Other (See Comments)    gynecomastia    Medications Reviewed Today     Reviewed by Flora Lipps, MD (Physician) on 02/02/22 at 1544  Med List Status: <None>   Medication Order Taking? Sig Documenting Provider Last Dose Status Informant  acetaminophen (TYLENOL) 500 MG tablet [id number] No Take 500 mg by mouth every 6 (six) hours as needed. [provider] Taking Active   albuterol (PROVENTIL) (2.5 MG/3ML) 0.083% nebulizer solution [id number] No Take 3 mLs (2.5 mg total) by nebulization every 4 (four) hours. And PRN  Patient taking differently: Take 2.5 mg by nebulization as needed.   Flora Lipps, MD Taking Active            Med Note [facility],  Ria Comment S   Tue May 11, 2020  8:29 AM) PRN, hasnt had to use in 2-3 months  amLODipine (NORVASC) 10 MG tablet [id number] No Take 1 tablet (10 mg total) by mouth daily. Teodoro Spray, MD Taking Active   aspirin 81 MG tablet [id number] No Take 81 mg by mouth daily. [provider] Taking Active   carvedilol (COREG) 25 MG tablet [id number] No TAKE 1 TABLET BY MOUTH TWICE DAILY [name], [name], MD Taking Active   cetirizine (ZYRTEC) 10 MG tablet [id number] No Take 10 mg by mouth daily. [provider] Taking Active   cloNIDine (CATAPRES) 0.2 MG tablet [id number] No TAKE 1 TABLET BY MOUTH TWICE DAILY [name], [name], MD Taking Active   Continuous Blood Gluc Receiver (FREESTYLE LIBRE 2 READER) [place] [id number] No USE TO MONITOR BLOOD SUGARS AS DIRECTED [name], MD Taking Active   Continuous Blood Gluc Sensor (FREESTYLE LIBRE 2 SENSOR) [place] [id number] No Place 1 sensor on the skin every 14 days to monitor blood sugars continuously [name], MD Taking Active   Cyanocobalamin (VITAMIN B 12 PO) [id number] No Take 1,000 mcg by mouth daily.  [provider] Taking Active Self  dapagliflozin propanediol ([name]) 10 MG TABS tablet [id number] No Take 10 mg by mouth daily. [provider] Taking Active   Dulaglutide (TRULICITY) 1.5 MB/5.5HR SOPN [id number] No Inject 1.5 mg into the skin once a week. [place] patient assistance through Dec 2023 [name], MD Taking Active   erythromycin ophthalmic ointment [id number] No Apply 1 a small amount into right eye every night [provider] Taking Active   finasteride (PROSCAR) 5 MG tablet [id number]  Take 1 tablet (5 mg total) by mouth daily. Stoioff, Ronda Fairly, MD  Active   furosemide (LASIX) 40 MG tablet [id number] No Take 40 mg by mouth 2 (two) times daily.  [provider] Taking Active   gabapentin (NEURONTIN) 600 MG tablet [id number] No TAKE 1 TABLET BY MOUTH 2 TIMES DAILY [name], [name], MD  Taking Active   hydrALAZINE (APRESOLINE) 100 MG tablet [id number]  Take 1 tablet (100 mg total) by mouth 3 (three) times daily. [name], MD  Active   Insulin Glargine [facility]) 100 UNIT/ML [id number] No Inject 21 Units into the skin daily. [place] patient assistance through Dec 2023 [name], MD Taking Active   insulin lispro (HUMALOG KWIKPEN) 100 UNIT/ML KwikPen [id number] No Inject 6-10 Units into the skin 3 (three) times daily. Sliding scale. [place] patient assistance through Dec 2023 [name], MD Taking Active   Insulin Pen Needle (BD PEN NEEDLE NANO U/F) 32G X 4 MM MISC [id number] No USE DAILY AS DIRECTED [name], MD Taking Active   isosorbide mononitrate (IMDUR) 60 MG 24 hr tablet [id number] No TAKE 1 TABLET BY MOUTH DAILY [name], MD Taking Active   levothyroxine (SYNTHROID) 125 MCG tablet [id number]  TAKE 1 TABLET EVERY DAY ON EMPTY STOMACHWITH A GLASS OF WATER AT LEAST 30-60 MINBEFORE BREAKFAST [name], MD  Active   losartan (COZAAR) 100 MG tablet [id number] No Take 1 tablet (100 mg total) by mouth daily. Teodoro Spray, MD Taking Active   Lysine 500 MG CAPS [id number] No Take 1 capsule by mouth daily. [provider] Taking Active   [facility] ULTRA test strip [id number] No USE AS DIRECTED THREE TIMES A DAY [name], MD Taking Active   pantoprazole (PROTONIX) 40 MG tablet [id number] No Take 1 tablet (40 mg total) by mouth daily before breakfast. [name], MD Taking Active   rosuvastatin (CRESTOR) 20 MG tablet [id number]  Take 1 tablet (20 mg total) by mouth daily. (TAKE IN PLACE OF ATORVASTATIN AND CANCEL ATORVASTATIN REFILLS) [name], MD  Active   senna (SENOKOT) 8.6 MG tablet [id number] No Take by mouth. [provider] Taking Active Self  SENNA PO [id number] No Take 1 capsule by mouth in the morning, at noon, in the evening, and at bedtime. [provider] Taking Active Self   SYMBICORT 80-4.5 MCG/ACT inhaler [id number] No INHALE 2 PUFFS INTO LUNGS TWICE DAILY Flora Lipps, MD Taking Active Self           Med Note Michaelle Birks, Lakeisa Heninger A   Mon Aug 15, 2021  3:06 PM) Takes PRN  terazosin (HYTRIN) 5 MG capsule [id number]  TAKE 1 CAPSULE BY MOUTH DAILY. TAKE IN PLACE OF DOXAZOSIN. [name], MD  Active   Med List Note Margarita Mail, RN 03/31/15 1507): UDS done 08-11-14 Meds to last until 10-09-14            Patient Active Problem List   Diagnosis Date Noted   Vitamin D deficiency 05/27/2019   Vitamin B12 deficiency 05/27/2019   Benign hypertensive kidney disease with chronic kidney disease 04/23/2019   Nephrotic syndrome, focal and segmental glomerular lesions 04/23/2019   Proteinuria 04/23/2019   Secondary hyperparathyroidism of renal origin ([place]) 04/23/2019   Trigger middle finger of right hand 01/20/2019   Carpal tunnel syndrome, right 01/20/2019   Focal segmental glomerulosclerosis 03/26/2018   Primary osteoarthritis of first carpometacarpal joint of right hand 07/30/2016   Coronary artery disease with unstable angina pectoris ([place]) 03/31/2015   Atherosclerotic  heart disease of native coronary artery with unstable angina pectoris ([place]) 67/67/2094   Diastolic heart failure ([place]) 02/15/2015   Regional wall motion abnormality of heart 02/15/2015   Abnormal findings on diagnostic imaging of heart and coronary circulation 02/15/2015   Restrictive lung disease 11/10/2014   History of adenomatous polyp of colon 11/09/2014   Hypothyroidism 11/09/2014   Diabetes mellitus with nephropathy ([place]) 11/09/2014   Hyperlipidemia 11/09/2014   Depression 11/09/2014   Insomnia 11/09/2014   Resistant hypertension 11/09/2014   GERD (gastroesophageal reflux disease) 11/09/2014   Chronic kidney disease (CKD), stage III (moderate) (HCC) 11/09/2014   Dyspnea on exertion 11/09/2014   History of basal cell cancer 09/15/2014   DDD (degenerative disc disease), lumbar  09/09/2014   Facet syndrome, lumbar 09/09/2014   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   Greater trochanteric bursitis of both hips 09/09/2014   Allergic rhinitis 08/12/2014   Benign prostatic hyperplasia with urinary obstruction 06/24/2013   Nocturia 05/30/2013   Frequency of micturition 05/30/2013   Urge incontinence 05/30/2013   Asthma, chronic 05/29/2013   Morbid obesity ([place]) 05/29/2013   OSA treated with BiPAP 05/29/2013   Scoliosis 11/28/2011   SCC (squamous cell carcinoma), face 08/01/2011    Immunization History  Administered Date(s) Administered   Fluad Quad(high Dose 65+) 01/21/2019   Influenza Split 02/26/2013, 01/12/2014   Influenza, High Dose Seasonal PF 02/09/2015, 03/07/2016, 03/20/2017, 02/13/2018, 01/30/2020   PFIZER Comirnaty(Gray Top)Covid-19 Tri-Sucrose Vaccine 06/17/2019, 07/08/2019   PFIZER(Purple Top)SARS-COV-2 Vaccination 06/17/2019, 07/08/2019, 01/30/2020   Pneumococcal Conjugate-13 01/16/2014   Pneumococcal Polysaccharide-23 07/07/2008, 03/07/2016   Td 01/05/2003   Tdap 07/07/2008   Zoster Recombinat (Shingrix) 09/28/2017, 12/21/2017    Conditions to be addressed/monitored:  Hypertension, Hyperlipidemia, Diabetes, Heart Failure, Asthma, Chronic Kidney Disease, Hypothyroidism, Depression, BPH and Insomnia  Care Plan : General Pharmacy (Adult)  Updates made by [name], RPH since 02/08/2022 12:00 AM     Problem: Hypertension, Hyperlipidemia, Diabetes, Heart Failure, Asthma, Chronic Kidney Disease, Hypothyroidism, Depression, BPH and Insomnia   Priority: High     Long-Range Goal: Patient-Specific Goal   Start Date: 07/13/2020  Expected End Date: 11/16/2022  This Visit's Progress: On track  Recent Progress: On track  Priority: High  Note:   Current Barriers:  Unable to independently afford treatment regimen  Pharmacist Clinical Goal(s):  Patient will verbalize ability to afford treatment regimen through collaboration with PharmD and  provider.   Interventions: 1:1 collaboration with [name], MD regarding development and update of comprehensive plan of care as evidenced by provider attestation and co-signature Inter-disciplinary care team collaboration (see longitudinal plan of care) Comprehensive medication review performed; medication list updated in electronic medical record  Diabetes (A1c goal <8%) -Controlled -Current medications: Trulicity 1.5 mg weekly (fridays): Appropriate, Effective, Safe, Accessible  Humalog sliding scale (4-8 units typically) -adjusts based on carb intake: Appropriate, Effective, Safe, Accessible   Basaglar 21 units daily: Appropriate, Effective, Query Safe, Accessible   [name] 10 mg daily: Appropriate, Effective, Safe, Accessible   -Medications previously tried: Bydureon, Glipizide, Victoza, metformin, Ozempic, Actos, Januvia, Invokana -Current home glucose readings  Target 7/12-7/29 10/11-10/17  Number of days worn ? 14 days  14  % of time active ? 70% 92% 89%  Mean Glucose (mg/dL)  113 123  GMI (2-week A1c estimate)  6.0% 6.3%  Glycemic Variability (%CV) ?36% 30.9% 21.8%  Time above >250 mg/dL <10% 0% 2%  Time above 70-180 mg/dL <50% 4% 98%  Time in range: 70-180 mg/dL >50% 89% 0%  Time below 70 mg/dL <1% 7% 0%  -Reports hypoglycemic symptoms. 8 instances of hypoglycemia in past 2 weeks, with low noted on CGM report of 53.  -Current dietary patterns:  Tilapia + green beans  -Current exercise: Sedentary. Patient is limited by sciatica, neuropathy pain.  -Continue current medications   Heart Failure (Goal: manage symptoms and prevent exacerbations) -Controlled -Last ejection fraction: 50% -HF type: Diastolic -NYHA Class: II (slight limitation of activity) -AHA HF Stage: C (Heart disease and symptoms present) -Current treatment: Amlodipine 10 mg daily  Carvedilol 25 mg twice daily  Clonidine 0.2 mg twice daily   Furosemide 40 mg twice daily  Hydralazine 100 mg three  times daily  Imdur 60 mg daily  Losartan 100 mg nightly  Terazosin 5 mg daily -Medications previously tried: Chlorthalidone, spironolactone, nebivolol   -Current home BP/HR readings: NA, does not monitor at home. -Recommended to continue current medication  Hyperlipidemia: (LDL goal < 70) -Controlled -History of CAD s/p PCI 2016 -Current treatment: Rosuvastatin 20 mg daily: Appropriate, Effective, Query Safe, Accessible   -Current treatment: Aspirin 81 mg daily: Appropriate, Effective, Query Safe, Accessible -Medications previously tried: NA  -Clopidogrel stopped by cardiology, patient to continue with aspirin indefinitely.  -Educated on Importance of limiting foods high in cholesterol; -Recommended to continue current medication  Hypothyroidism (Goal: Maintain stable thyroid function) -Controlled -Current treatment  Levothyroxine 125 mcg daily before breakfast  -Medications previously tried: NA  -Recommended to continue current medication  Asthma with allergic rhinitis (Goal: control symptoms and prevent exacerbations) -Controlled -Current treatment  Symbicort 2 puffs twice daily as needed  -Medications previously tried: NA  -Exacerbations requiring treatment in last 6 months: None -Frequency of rescue inhaler use: Infrequent -Patient with daily congestion  -Recommended to continue current medication  Chronic Kidney Disease Stage 3b-4  -All medications assessed for renal dosing and appropriateness in chronic kidney disease. -Recommended to continue current medication  Patient Goals/Self-Care Activities Patient will:  - check glucose 3-4 times daily: before meals and at bedtime, document, and provide at future appointments check blood pressure weekly, document, and provide at future appointments  Follow Up Plan: Telephone follow up appointment with care management team member scheduled for:  07/25/2022 at 3:00 PM     Medication Assistance:  Humalog, Basaglar, Trulicity  obtained through [employer] medication assistance program.  Enrollment ends Dec 2023   [name] obtained through [place] medication assistance [place]. Via Nephrology  Patient's preferred pharmacy is:  [place], [place] - [address] [address] Phone: [phone] Fax: [phone]  Uses pill box? Yes Pt endorses 100% compliance  We discussed: Current pharmacy is preferred with insurance plan and patient is satisfied with pharmacy services Patient decided to: Continue current medication management strategy  Care Plan and Follow Up Patient Decision:  Patient agrees to Care Plan and Follow-up.  Plan: Telephone follow up appointment with care management team member scheduled for:  07/25/2022 at 3:00 PM  [name], PharmD, [facility] [place], [place] [phone]

## 2022-02-08 NOTE — Patient Instructions (Signed)
Visit Information It was great speaking with you today!  Please let me know if you have any questions about our visit.   Goals Addressed             This Visit's Progress    Monitor and Manage My Blood Sugar-Diabetes Type 2   On track    Timeframe:  Long-Range Goal Priority:  High Start Date:  07/13/20                           Expected End Date: 01/14/23                      Follow Up within 90 days    -check blood sugar at least 5 times daily - check blood sugar if I feel it is too high or too low - enter blood sugar readings and medication or insulin into daily log - take the blood sugar log to all doctor visits    Why is this important?   Checking your blood sugar at home helps to keep it from getting very high or very low.  Writing the results in a diary or log helps the doctor know how to care for you.  Your blood sugar log should have the time, date and the results.  Also, write down the amount of insulin or other medicine that you take.  Other information, like what you ate, exercise done and how you were feeling, will also be helpful.     Notes:         Patient Care Plan: General Pharmacy (Adult)     Problem Identified: Hypertension, Hyperlipidemia, Diabetes, Heart Failure, Asthma, Chronic Kidney Disease, Hypothyroidism, Depression, BPH and Insomnia   Priority: High     Long-Range Goal: Patient-Specific Goal   Start Date: 07/13/2020  Expected End Date: 11/16/2022  This Visit's Progress: On track  Recent Progress: On track  Priority: High  Note:   Current Barriers:  Unable to independently afford treatment regimen  Pharmacist Clinical Goal(s):  Patient will verbalize ability to afford treatment regimen through collaboration with PharmD and provider.   Interventions: 1:1 collaboration with [name], MD regarding development and update of comprehensive plan of care as evidenced by provider attestation and co-signature Inter-disciplinary care team  collaboration (see longitudinal plan of care) Comprehensive medication review performed; medication list updated in electronic medical record  Diabetes (A1c goal <8%) -Controlled -Current medications: Trulicity 1.5 mg weekly (fridays): Appropriate, Effective, Safe, Accessible  Humalog sliding scale (4-8 units typically) -adjusts based on carb intake: Appropriate, Effective, Safe, Accessible   Basaglar 21 units daily: Appropriate, Effective, Query Safe, Accessible   [name] 10 mg daily: Appropriate, Effective, Safe, Accessible   -Medications previously tried: Bydureon, Glipizide, Victoza, metformin, Ozempic, Actos, Januvia, Invokana -Current home glucose readings  Target 7/12-7/29 10/11-10/17  Number of days worn ? 14 days  14  % of time active ? 70% 92% 89%  Mean Glucose (mg/dL)  113 123  GMI (2-week A1c estimate)  6.0% 6.3%  Glycemic Variability (%CV) ?36% 30.9% 21.8%  Time above >250 mg/dL <10% 0% 2%  Time above 70-180 mg/dL <50% 4% 98%  Time in range: 70-180 mg/dL >50% 89% 0%  Time below 70 mg/dL <1% 7% 0%  -Reports hypoglycemic symptoms. 8 instances of hypoglycemia in past 2 weeks, with low noted on CGM report of 53.  -Current dietary patterns:  Tilapia + green beans  -Current exercise: Sedentary. Patient is  limited by sciatica, neuropathy pain.  -Continue current medications   Heart Failure (Goal: manage symptoms and prevent exacerbations) -Controlled -Last ejection fraction: 50% -HF type: Diastolic -NYHA Class: II (slight limitation of activity) -AHA HF Stage: C (Heart disease and symptoms present) -Current treatment: Amlodipine 10 mg daily  Carvedilol 25 mg twice daily  Clonidine 0.2 mg twice daily   Furosemide 40 mg twice daily  Hydralazine 100 mg three times daily  Imdur 60 mg daily  Losartan 100 mg nightly  Terazosin 5 mg daily -Medications previously tried: Chlorthalidone, spironolactone, nebivolol   -Current home BP/HR readings: NA, does not monitor at  home. -Recommended to continue current medication  Hyperlipidemia: (LDL goal < 70) -Controlled -History of CAD s/p PCI 2016 -Current treatment: Rosuvastatin 20 mg daily: Appropriate, Effective, Query Safe, Accessible   -Current treatment: Aspirin 81 mg daily: Appropriate, Effective, Query Safe, Accessible -Medications previously tried: NA  -Clopidogrel stopped by cardiology, patient to continue with aspirin indefinitely.  -Educated on Importance of limiting foods high in cholesterol; -Recommended to continue current medication  Hypothyroidism (Goal: Maintain stable thyroid function) -Controlled -Current treatment  Levothyroxine 125 mcg daily before breakfast  -Medications previously tried: NA  -Recommended to continue current medication  Asthma with allergic rhinitis (Goal: control symptoms and prevent exacerbations) -Controlled -Current treatment  Symbicort 2 puffs twice daily as needed  -Medications previously tried: NA  -Exacerbations requiring treatment in last 6 months: None -Frequency of rescue inhaler use: Infrequent -Patient with daily congestion  -Recommended to continue current medication  Chronic Kidney Disease Stage 3b-4  -All medications assessed for renal dosing and appropriateness in chronic kidney disease. -Recommended to continue current medication  Patient Goals/Self-Care Activities Patient will:  - check glucose 3-4 times daily: before meals and at bedtime, document, and provide at future appointments check blood pressure weekly, document, and provide at future appointments  Follow Up Plan: Telephone follow up appointment with care management team member scheduled for:  07/25/2022 at 3:00 PM    Patient agreed to services and verbal consent obtained.   Patient verbalizes understanding of instructions and care plan provided today and agrees to view in [place]. Active MyChart status and patient understanding of how to access instructions and care plan via  MyChart confirmed with patient.     [name], PharmD, Para [place], CPP  Clinical Pharmacist Practitioner  [facility] [phone]

## 2022-02-21 DIAGNOSIS — J45909 Unspecified asthma, uncomplicated: Secondary | ICD-10-CM

## 2022-02-21 DIAGNOSIS — G4733 Obstructive sleep apnea (adult) (pediatric): Secondary | ICD-10-CM | POA: Diagnosis not present

## 2022-02-21 DIAGNOSIS — I10 Essential (primary) hypertension: Secondary | ICD-10-CM | POA: Diagnosis not present

## 2022-02-21 DIAGNOSIS — E039 Hypothyroidism, unspecified: Secondary | ICD-10-CM

## 2022-02-21 DIAGNOSIS — I509 Heart failure, unspecified: Secondary | ICD-10-CM

## 2022-02-21 DIAGNOSIS — Z794 Long term (current) use of insulin: Secondary | ICD-10-CM

## 2022-02-21 DIAGNOSIS — I25118 Atherosclerotic heart disease of native coronary artery with other forms of angina pectoris: Secondary | ICD-10-CM | POA: Diagnosis not present

## 2022-02-21 DIAGNOSIS — E785 Hyperlipidemia, unspecified: Secondary | ICD-10-CM

## 2022-02-21 DIAGNOSIS — E1122 Type 2 diabetes mellitus with diabetic chronic kidney disease: Secondary | ICD-10-CM | POA: Diagnosis not present

## 2022-02-21 DIAGNOSIS — E1159 Type 2 diabetes mellitus with other circulatory complications: Secondary | ICD-10-CM | POA: Diagnosis not present

## 2022-02-21 DIAGNOSIS — N1832 Chronic kidney disease, stage 3b: Secondary | ICD-10-CM

## 2022-02-21 DIAGNOSIS — E782 Mixed hyperlipidemia: Secondary | ICD-10-CM | POA: Diagnosis not present

## 2022-02-22 DIAGNOSIS — H60331 Swimmer's ear, right ear: Secondary | ICD-10-CM | POA: Diagnosis not present

## 2022-02-22 DIAGNOSIS — H6011 Cellulitis of right external ear: Secondary | ICD-10-CM | POA: Diagnosis not present

## 2022-02-23 ENCOUNTER — Telehealth: Payer: Self-pay

## 2022-02-23 NOTE — Progress Notes (Signed)
Per Clinical pharmacist, Reach out to nephrology to send updated Rx to [facility]   I reach out to [facility] associate where I left a voice message to request them to send a prescription of [name] to [place] to continue patient assistance for year 2024.My contact information was left for them to return my call.   [facility] [place] [facility] associate return my call confirming they will send in a new prescription for [name] to continue patient assistance for year 2024.  [place] Pharmacist Assistant [phone]

## 2022-03-03 ENCOUNTER — Other Ambulatory Visit: Payer: Self-pay | Admitting: Family Medicine

## 2022-03-03 NOTE — Telephone Encounter (Signed)
Requested Prescriptions  Pending Prescriptions Disp Refills   pantoprazole (PROTONIX) 40 MG tablet [Pharmacy Med Name: PANTOPRAZOLE SODIUM 40 MG DR TAB] 30 tablet 0    Sig: TAKE ONE TABLET BY MOUTH DAILY BEFORE BREAKFAST     Gastroenterology: Proton Pump Inhibitors Passed - 03/03/2022  4:36 PM      Passed - Valid encounter within last 12 months    Recent Outpatient Visits           2 months ago Prostate cancer screening   [facility] [name], MD   6 months ago Resistant hypertension   [facility] [name], MD   9 months ago Annual physical exam   [facility] [name], MD   10 months ago Upper respiratory tract infection, unspecified type   [facility] [name], MD   1 year ago Diabetes mellitus with nephropathy [facility])   Dickey, [name], MD       Future Appointments             In 1 month [name], [name], MD [facility] [place]   In 8 months Stoioff, Ronda Fairly, MD [place]

## 2022-03-08 DIAGNOSIS — H60331 Swimmer's ear, right ear: Secondary | ICD-10-CM | POA: Diagnosis not present

## 2022-03-08 DIAGNOSIS — H6011 Cellulitis of right external ear: Secondary | ICD-10-CM | POA: Diagnosis not present

## 2022-03-12 ENCOUNTER — Other Ambulatory Visit: Payer: Self-pay | Admitting: Family Medicine

## 2022-03-12 DIAGNOSIS — E1121 Type 2 diabetes mellitus with diabetic nephropathy: Secondary | ICD-10-CM

## 2022-03-12 DIAGNOSIS — I5032 Chronic diastolic (congestive) heart failure: Secondary | ICD-10-CM

## 2022-03-12 DIAGNOSIS — I1A0 Resistant hypertension: Secondary | ICD-10-CM

## 2022-03-13 ENCOUNTER — Telehealth: Payer: Self-pay | Admitting: Internal Medicine

## 2022-03-13 DIAGNOSIS — G4733 Obstructive sleep apnea (adult) (pediatric): Secondary | ICD-10-CM

## 2022-03-13 NOTE — Telephone Encounter (Signed)
Spoke to patient. He stated that he is having issues with his cpap machine. He occ wakes during the night due to his stomach being full of air. When this occurs his stomach is tight, swollen and he burps a lot. At last OV Dr. Mortimer Fries recommended that he see Dr. Halford Chessman. Dr. Halford Chessman does not have any availability in [place] at this time. Patient is not willing to travel to [place]. He is scheduled to see Dr. Mortimer Fries 03/24/2022, however he wants to confirm that these issues can be discussed and addressed during this visit. He feels that his pressure is too strong, as this issue occurred after a pressure change.  He is okay with waiting until 03/20/2022 for a response once Dr. Mortimer Fries is back in office. Unable to send to Dr. Halford Chessman for recommendations as he is unavailable as well.    Dr. Mortimer Fries, please advise. Thanks

## 2022-03-20 NOTE — Telephone Encounter (Signed)
Per [facility] compliance report- current settings are min EPAP 12 maz IPAP 25 and pressure support 10.

## 2022-03-20 NOTE — Telephone Encounter (Signed)
Order placed to [facility] to decrease pressure.  Patient is aware and voiced his understanding.  Nothing further needed.

## 2022-03-24 ENCOUNTER — Ambulatory Visit: Payer: PPO | Admitting: Internal Medicine

## 2022-03-28 ENCOUNTER — Telehealth: Payer: Self-pay

## 2022-03-28 NOTE — Progress Notes (Signed)
  Chronic Care Management   Note  03/28/2022 Name: Jacob Herrera MRN: [id number] DOB: [birth year]  [name] is a 71 y.o. year old male who is a primary care patient of Caryn Section, [name], MD. I reached out to [name] by phone today in response to a referral sent by Mr. Coleby Yett Skipper's PCP.  Mr. Malosi Hemstreet  agreedto scheduling an appointment with the CCM RN Case Manager   Follow up plan: Patient agreed to scheduled appointment with RN Case Manager on 05/01/2022(date/time).   Noreene Larsson, [place], [place] [address] Direct Dial: [phone] [email or web address]

## 2022-03-28 NOTE — Progress Notes (Signed)
  Chronic Care Management   Note  03/28/2022 Name: Jacob Herrera MRN: [id number] DOB: Dec 10, 1950  Jacob Herrera is a 71 y.o. year old male who is a primary care patient of Caryn Section, [name], MD. I reached out to Jacob Herrera by phone today in response to a referral sent by Jacob Herrera's PCP.  Jacob Herrera was not successfully contacted today. A HIPAA compliant voice message was left requesting a return call.   Follow up plan: Additional outreach attempts will be made.  Noreene Larsson, [place], [place] [address] Direct Dial: [phone] [email or web address]

## 2022-04-10 DIAGNOSIS — M65341 Trigger finger, right ring finger: Secondary | ICD-10-CM | POA: Diagnosis not present

## 2022-04-14 ENCOUNTER — Encounter: Payer: Self-pay | Admitting: Family Medicine

## 2022-04-14 ENCOUNTER — Ambulatory Visit (INDEPENDENT_AMBULATORY_CARE_PROVIDER_SITE_OTHER): Payer: PPO | Admitting: Family Medicine

## 2022-04-14 ENCOUNTER — Ambulatory Visit: Payer: PPO | Admitting: Family Medicine

## 2022-04-14 VITALS — BP 158/66 | HR 62 | Resp 18 | Wt 252.0 lb

## 2022-04-14 DIAGNOSIS — I1A0 Resistant hypertension: Secondary | ICD-10-CM | POA: Diagnosis not present

## 2022-04-14 DIAGNOSIS — E785 Hyperlipidemia, unspecified: Secondary | ICD-10-CM | POA: Diagnosis not present

## 2022-04-14 DIAGNOSIS — E1121 Type 2 diabetes mellitus with diabetic nephropathy: Secondary | ICD-10-CM

## 2022-04-14 NOTE — Progress Notes (Signed)
I,April Miller,acting as a scribe for Lelon Huh, MD.,have documented all relevant documentation on the behalf of Lelon Huh, MD,as directed by  Lelon Huh, MD while in the presence of Lelon Huh, MD.  Established patient visit   Patient: Jacob Herrera   DOB: [birth year]   71 y.o. Male  MRN: [id number] Visit Date: 04/14/2022  Today's healthcare provider: Lelon Huh, MD   No chief complaint on file.  Subjective    HPI  Diabetes Mellitus Type II, follow-up  Lab Results  Component Value Date   HGBA1C 6.2 (H) 12/09/2021   HGBA1C 5.4 05/13/2021   HGBA1C 6.2 (H) 11/12/2020   Last seen for diabetes 4 months ago.  Management since then includes continuing the same treatment.  Home blood sugar records:  Most Recent Eye Exam: October of this year Is currently taking 23 units Basaglar daily and 6-7 humalog before meals and has occasional lows.  --------------------------------------------------------------------------------------------------- Hypertension, follow-up  BP Readings from Last 3 Encounters:  02/02/22 (!) 140/70  12/09/21 (!) 145/63  11/24/21 (!) 160/63   Wt Readings from Last 3 Encounters:  02/02/22 250 lb (113.4 kg)  12/09/21 248 lb (112.5 kg)  11/24/21 240 lb (108.9 kg)     He was last seen for hypertension 4 months ago.  Management since that visit includes no changes.    --------------------------------------------------------------------------------------------------- Lipid/Cholesterol, follow-up  Last Lipid Panel: Lab Results  Component Value Date   CHOL 144 12/09/2021   LDLCALC 61 12/09/2021   HDL 32 (L) 12/09/2021   TRIG 325 (H) 12/09/2021    He was last seen for this 4 months ago.  Management since that visit includes changing atorvastatin to rosuvastatin due to joint pains which have since improved.    ---------------------------------------------------------------------------------------------------   Medications: Outpatient Medications Prior to Visit  Medication Sig   acetaminophen (TYLENOL) 500 MG tablet Take 500 mg by mouth every 6 (six) hours as needed.   albuterol (PROVENTIL) (2.5 MG/3ML) 0.083% nebulizer solution Take 3 mLs (2.5 mg total) by nebulization every 4 (four) hours. And PRN (Patient taking differently: Take 2.5 mg by nebulization as needed.)   amLODipine (NORVASC) 10 MG tablet Take 1 tablet (10 mg total) by mouth daily.   aspirin 81 MG tablet Take 81 mg by mouth daily.   carvedilol (COREG) 25 MG tablet TAKE 1 TABLET BY MOUTH TWICE DAILY   cetirizine (ZYRTEC) 10 MG tablet Take 10 mg by mouth daily.   cloNIDine (CATAPRES) 0.2 MG tablet TAKE 1 TABLET BY MOUTH TWICE DAILY   Continuous Blood Gluc Receiver (FREESTYLE LIBRE 2 READER) DEVI USE TO MONITOR BLOOD SUGARS AS DIRECTED   Continuous Blood Gluc Sensor (FREESTYLE LIBRE 2 SENSOR) MISC Place 1 sensor on the skin every 14 days to monitor blood sugars continuously   Cyanocobalamin (VITAMIN B 12 PO) Take 1,000 mcg by mouth daily.    dapagliflozin propanediol ([name]) 10 MG TABS tablet Take 10 mg by mouth daily.   Dulaglutide (TRULICITY) 1.5 ML/4.6TK SOPN Inject 1.5 mg into the skin once a week. [place] patient assistance through Dec 2023   erythromycin ophthalmic ointment Apply 1 a small amount into right eye every night   finasteride (PROSCAR) 5 MG tablet Take 1 tablet (5 mg total) by mouth daily.   furosemide (LASIX) 40 MG tablet Take 40 mg by mouth 2 (two) times daily.    gabapentin (NEURONTIN) 600 MG tablet TAKE 1 TABLET BY MOUTH 2 TIMES DAILY   hydrALAZINE (APRESOLINE) 100 MG tablet  Take 1 tablet (100 mg total) by mouth 3 (three) times daily.   Insulin Glargine (BASAGLAR KWIKPEN) 100 UNIT/ML Inject 21 Units into the skin daily. [employer] patient assistance through Dec 2023   insulin lispro (HUMALOG  KWIKPEN) 100 UNIT/ML KwikPen Inject 6-10 Units into the skin 3 (three) times daily. Sliding scale. [place] patient assistance through Dec 2023   Insulin Pen Needle (BD PEN NEEDLE NANO U/F) 32G X 4 MM MISC USE DAILY AS DIRECTED   isosorbide mononitrate (IMDUR) 60 MG 24 hr tablet TAKE 1 TABLET BY MOUTH DAILY   levothyroxine (SYNTHROID) 125 MCG tablet TAKE 1 TABLET EVERY DAY ON EMPTY STOMACHWITH A GLASS OF WATER AT LEAST 30-60 MINBEFORE BREAKFAST   losartan (COZAAR) 100 MG tablet Take 1 tablet (100 mg total) by mouth daily.   Lysine 500 MG CAPS Take 1 capsule by mouth daily.   ONETOUCH ULTRA test strip USE AS DIRECTED THREE TIMES A DAY   pantoprazole (PROTONIX) 40 MG tablet TAKE ONE TABLET BY MOUTH DAILY BEFORE BREAKFAST   rosuvastatin (CRESTOR) 20 MG tablet Take 1 tablet (20 mg total) by mouth daily. (TAKE IN PLACE OF ATORVASTATIN AND CANCEL ATORVASTATIN REFILLS)   senna (SENOKOT) 8.6 MG tablet Take by mouth.   SENNA PO Take 1 capsule by mouth in the morning, at noon, in the evening, and at bedtime.   SYMBICORT 80-4.5 MCG/ACT inhaler INHALE 2 PUFFS INTO LUNGS TWICE DAILY   terazosin (HYTRIN) 5 MG capsule TAKE 1 CAPSULE BY MOUTH DAILY. TAKE IN PLACE OF DOXAZOSIN.   No facility-administered medications prior to visit.    Review of Systems  Constitutional:  Negative for appetite change, chills and fever.  Respiratory:  Negative for chest tightness, shortness of breath and wheezing.   Cardiovascular:  Negative for chest pain and palpitations.  Gastrointestinal:  Negative for abdominal pain, nausea and vomiting.      Objective    BP (!) 158/66 (BP Location: Left Arm, Patient Position: Sitting, Cuff Size: Large)   Pulse 62   Resp 18   Wt 252 lb (114.3 kg)   SpO2 96%   BMI 38.32 kg/m    Physical Exam   General: Appearance:    Obese male in no acute distress  Eyes:    PERRL, conjunctiva/corneas clear, EOM's intact       Lungs:     Clear to auscultation bilaterally, respirations  unlabored  Heart:    Normal heart rate. Normal rhythm. No murmurs, rubs, or gallops.    MS:   All extremities are intact.    Neurologic:   Awake, alert, oriented x 3. No apparent focal neurological defect.         Assessment & Plan     1. Hyperlipidemia, unspecified hyperlipidemia type Doing well with change from atorvastatin to rosuvastatin and feels that joint aches have improved.  - Lipid panel   2. Diabetes mellitus with nephropathy (HCC)  - Comprehensive metabolic panel - Hemoglobin A1c   3. Resistant hypertension Fairly well controlled. Continue current medications.        The entirety of the information documented in the History of Present Illness, Review of Systems and Physical Exam were personally obtained by me. Portions of this information were initially documented by the CMA and reviewed by me for thoroughness and accuracy.     Lelon Huh, MD  [facility] [phone] (phone) [phone] (fax)  [place]

## 2022-04-15 LAB — LIPID PANEL
Chol/HDL Ratio: 3.5 ratio (ref 0.0–5.0)
Cholesterol, Total: 131 mg/dL (ref 100–199)
HDL: 37 mg/dL — ABNORMAL LOW (ref >39)
LDL Chol Calc (NIH): 50 mg/dL (ref 0–99)
Triglycerides: 285 mg/dL — ABNORMAL HIGH (ref 0–149)
VLDL Cholesterol Cal: 44 mg/dL — ABNORMAL HIGH (ref 5–40)

## 2022-04-15 LAB — COMPREHENSIVE METABOLIC PANEL
ALT: 22 IU/L (ref 0–44)
AST: 22 IU/L (ref 0–40)
Albumin/Globulin Ratio: 1.8 (ref 1.2–2.2)
Albumin: 4.6 g/dL (ref 3.8–4.8)
Alkaline Phosphatase: 55 IU/L (ref 44–121)
BUN/Creatinine Ratio: 24 (ref 10–24)
BUN: 52 mg/dL — ABNORMAL HIGH (ref 8–27)
Bilirubin Total: 0.2 mg/dL (ref 0.0–1.2)
CO2: 22 mmol/L (ref 20–29)
Calcium: 10.4 mg/dL — ABNORMAL HIGH (ref 8.6–10.2)
Chloride: 101 mmol/L (ref 96–106)
Creatinine, Ser: 2.19 mg/dL — ABNORMAL HIGH (ref 0.76–1.27)
Globulin, Total: 2.6 g/dL (ref 1.5–4.5)
Glucose: 152 mg/dL — ABNORMAL HIGH (ref 70–99)
Potassium: 4.9 mmol/L (ref 3.5–5.2)
Sodium: 137 mmol/L (ref 134–144)
Total Protein: 7.2 g/dL (ref 6.0–8.5)
eGFR: 31 mL/min/{1.73_m2} — ABNORMAL LOW (ref >59)

## 2022-04-15 LAB — HEMOGLOBIN A1C
Est. average glucose Bld gHb Est-mCnc: 128 mg/dL
Hgb A1c MFr Bld: 6.1 % — ABNORMAL HIGH (ref 4.8–5.6)

## 2022-04-18 ENCOUNTER — Encounter: Payer: Self-pay | Admitting: *Deleted

## 2022-04-20 DIAGNOSIS — J45909 Unspecified asthma, uncomplicated: Secondary | ICD-10-CM | POA: Diagnosis not present

## 2022-04-20 DIAGNOSIS — G4733 Obstructive sleep apnea (adult) (pediatric): Secondary | ICD-10-CM | POA: Diagnosis not present

## 2022-04-28 ENCOUNTER — Other Ambulatory Visit: Payer: Self-pay | Admitting: Family Medicine

## 2022-04-28 DIAGNOSIS — G629 Polyneuropathy, unspecified: Secondary | ICD-10-CM

## 2022-05-01 ENCOUNTER — Ambulatory Visit (INDEPENDENT_AMBULATORY_CARE_PROVIDER_SITE_OTHER): Payer: PPO

## 2022-05-01 DIAGNOSIS — I1A0 Resistant hypertension: Secondary | ICD-10-CM

## 2022-05-01 DIAGNOSIS — R600 Localized edema: Secondary | ICD-10-CM | POA: Diagnosis not present

## 2022-05-01 DIAGNOSIS — N2581 Secondary hyperparathyroidism of renal origin: Secondary | ICD-10-CM | POA: Diagnosis not present

## 2022-05-01 DIAGNOSIS — E785 Hyperlipidemia, unspecified: Secondary | ICD-10-CM

## 2022-05-01 DIAGNOSIS — I10 Essential (primary) hypertension: Secondary | ICD-10-CM | POA: Diagnosis not present

## 2022-05-01 DIAGNOSIS — N184 Chronic kidney disease, stage 4 (severe): Secondary | ICD-10-CM | POA: Diagnosis not present

## 2022-05-01 DIAGNOSIS — E1122 Type 2 diabetes mellitus with diabetic chronic kidney disease: Secondary | ICD-10-CM | POA: Diagnosis not present

## 2022-05-01 DIAGNOSIS — E1121 Type 2 diabetes mellitus with diabetic nephropathy: Secondary | ICD-10-CM

## 2022-05-01 NOTE — Chronic Care Management (AMB) (Incomplete)
  Chronic Care Management   CCM RN Visit Note  05/01/2022 Name: [name] MRN: [id number] DOB: [birth year]  Subjective: Jacob Herrera is a 72 y.o. year old male who is a primary care patient of Caryn Section, [name], MD. The patient was referred to the Chronic Care Management team for assistance with care management needs subsequent to provider initiation of CCM services and plan of care.    Today's Visit:  {CCMTELEPHONEFACETOFACE:21091510} for {CCMINITIALFOLLOWUPCHOICE:21091511}.     SDOH Interventions Today    Flowsheet Row Most Recent Value  SDOH Interventions   Food Insecurity Interventions Intervention Not Indicated  Housing Interventions Intervention Not Indicated  Transportation Interventions Intervention Not Indicated  Utilities Interventions Intervention Not Indicated  Alcohol Usage Interventions Intervention Not Indicated (Score <7)  Financial Strain Interventions Intervention Not Indicated  Stress Interventions Intervention Not Indicated         Goals Addressed             This Visit's Progress   . CCM Expected Outcome:  Monitor, Self-Manage and Reduce Symptoms of:      [name] Kitchen Patient Stated       Current Barriers:  Chronic Disease Management support and education needs related to Diabetes  Planned Interventions:      Lab Results  Component Value Date   HGBA1C 6.1 (H) 04/14/2022    Symptom Management:   Follow Up Plan:  Will follow up in three months          Plan:{CM FOLLOW UP PLAN:25073}  SIG***

## 2022-05-01 NOTE — Patient Instructions (Signed)
Thank you for allowing the Chronic Care Management team to participate in your care. It was great speaking with you   Following is a copy of the CCM Program Consent:  CCM service includes personalized support from designated clinical staff supervised by the physician, including individualized plan of care and coordination with other care providers 24/7 contact phone numbers for assistance for urgent and routine care needs. Service will only be billed when office clinical staff spend 20 minutes or more in a month to coordinate care. Only one practitioner may furnish and bill the service in a calendar month. The patient may stop CCM services at amy time (effective at the end of the month) by phone call to the office staff. The patient will be responsible for cost sharing (co-pay) or up to 20% of the service fee (after annual deductible is met)  Following is a copy of your full provider care plan:   Goals Addressed             This Visit's Progress    Goal: CCM (Diabetes) Expected Outcome:  Monitor, Self-Manage And Reduce Symptoms of Diabetes       Current Barriers:  Chronic Disease Management support and education needs related to Diabetes  Planned Interventions: Reviewed plan for Diabetes management Reviewed medications and discussed importance of medication adherence. Reports taking all medications as prescribed. Denies concerns related to medication management.  Discussed importance of consistent blood glucose monitoring. Currently using FreeStyle Libre for continuous blood glucose monitoring. Reports fasting blood sugar average of 111 mg/dl over the past two weeks. Reading was 95 mg/dl today. Discussed information regarding s/sx of hypoglycemia and hyperglycemia along with recommended interventions. Reports one lowest reading of 69 within the past two weeks. Denies hyperglycemic episodes. Discussed nutritional intake and importance of complying with a diabetic diet. Reports preparing  most meals at home. Discussed importance of increasing intake of vegetables and lean proteins. Advised to monitor intake of carbohydrates and avoid foods and beverages with added sugar. Declines need for additional nutritional resources. Discussed importance of completing recommended DM preventive care. Reports completing recommended foot care. Reports eye exam is up to date. Scheduled for follow up with Dr. Wallace Going on 01/29/23. Discussed importance of completing ordered labs as prescribed.  Assessed social determinant of health barriers.    Lab Results  Component Value Date   HGBA1C 6.1 (H) 04/14/2022    Symptom Management: Take medications as prescribed   Attend all scheduled provider appointments Call pharmacy for medication refills 3-7 days in advance of running out of medications Continue using the [place] reader to scan your sensor until your phone app is updated Check feet daily for cuts, sores or redness Wash and dry feet carefully every day Wear comfortable, cotton socks Wear comfortable, well-fitting shoes Read food labels for fat, fiber, carbohydrates and portion size Call provider office for new concerns or questions   Follow Up Plan:  Will follow up in three months       Goal: CCM (Hyperlipidemia) Expected Outcome:  Monitor, Self-Manage And Reduce Symptoms of Hyperlipidemia        Current Barriers:  Chronic Disease Management support and education needs related to HLD  Planned Interventions: Reviewed plan for management of hyperlipidemia. Reviewed established cholesterol goals reviewed. Reviewed medications. Reports taking medications as prescribed.  Discussed importance of regular laboratory monitoring as prescribed. Reviewed role and benefits of statin for ASCVD risk reduction. Reports tolerating statin well. Reviewed importance of limiting foods high in cholesterol. Reviewed exercise goals.  Reports ability to engage in regular exercise is limited d/t  degenerative joints. Encouraged to engage in low impact activities as tolerated. Reviewed s/sx of heart attack, stroke along with indications for seeking immediate medical attention.   Lab Results  Component Value Date   CHOL 131 04/14/2022   HDL 37 (L) 04/14/2022   LDLCALC 50 04/14/2022   TRIG 285 (H) 04/14/2022   CHOLHDL 3.5 04/14/2022    Symptom Management: Take medications as prescribed   Attend all scheduled provider appointments Call pharmacy for medication refills 3-7 days in advance of running out of medications Call doctor with any symptoms you believe are related to your medicine Adhere to prescribed diet Limit cholesterol intake Call provider office for new concerns or questions   Follow Up Plan: Will follow up in three months            Goal: CCM (Hypertension) Expected Outcome:  Monitor, Self-Manage And Reduce Symptoms of Hypertension        Current Barriers:  Chronic Disease Management support and education needs related to Hypertension  Planned Interventions: Reviewed current treatment plan related to Hypertension, self-management, and adherence to plan as established by provider.  Reviewed medications and indications for use. Reports having all needed medications and taking as prescribed. Provided information regarding established blood pressure parameters along with indications for notifying a provider. Advised to monitor consistently and record readings.  Reviewed symptoms. Denies chest pain or palpitations. Denies headaches, dizziness, or visual changes.  Discussed compliance with recommended cardiac prudent diet. Encouraged to read nutrition labels, continue monitoring sodium intake, and avoid highly processed foods when possible.  Reviewed s/sx of heart attack, stroke and worsening symptoms that require immediate medical attention.    BP Readings from Last 3 Encounters:  04/14/22 (!) 158/66  02/02/22 (!) 140/70  12/09/21 (!) 145/63    Symptom  Management: Take medications as prescribed   Attend all scheduled provider appointments Call pharmacy for medication refills 3-7 days in advance of running out of medications Call provider office for new concerns or questions  Check blood pressure  Keep a blood pressure log Call doctor for signs and symptoms of high blood pressure Eat whole grains, fruits and vegetables, lean meats and healthy fats Limit sodium intake  Report new symptoms to your doctor  Follow Up Plan:  Will follow up in three months              [name] understanding of instructions and care plan provided today. Agrees to view in MyChart.   A member of the care management team will follow up in three months.     Horris Latino RN Care Manager/Chronic Care Management [phone]

## 2022-05-01 NOTE — Telephone Encounter (Signed)
Requested medication (s) are due for refill today: expired medication  Requested medication (s) are on the active medication list: yes  Last refill:  05/06/21 #180 4 refills  Future visit scheduled:  yes in 3 months  Notes to clinic:   expired medication. Do you want to give courtesy refill for 3 months until appt? Or renew Rx?     Requested Prescriptions  Pending Prescriptions Disp Refills   gabapentin (NEURONTIN) 600 MG tablet [Pharmacy Med Name: GABAPENTIN 600 MG TAB] 180 tablet 4    Sig: TAKE 1 TABLET BY MOUTH 2 TIMES DAILY     Neurology: Anticonvulsants - gabapentin Failed - 04/28/2022  6:05 PM      Failed - Cr in normal range and within 360 days    Creatinine  Date Value Ref Range Status  07/08/2013 1.36 (H) 0.60 - 1.30 mg/dL Final   Creatinine, Ser  Date Value Ref Range Status  04/14/2022 2.19 (H) 0.76 - 1.27 mg/dL Final         Passed - Completed PHQ-2 or PHQ-9 in the last 360 days      Passed - Valid encounter within last 12 months    Recent Outpatient Visits           2 weeks ago Hyperlipidemia, unspecified hyperlipidemia type   [facility] [name], MD   4 months ago Prostate cancer screening   [facility] [name], MD   8 months ago Resistant hypertension   [facility] [name], MD   11 months ago Annual physical exam   [facility] [name], MD   1 year ago Upper respiratory tract infection, unspecified type   [facility] [name], MD       Future Appointments             In 3 months [name], [name], MD [facility], [place]   In 6 months Stoioff, Ronda Fairly, MD [place]

## 2022-05-01 NOTE — Chronic Care Management (AMB) (Signed)
Chronic Care Management   CCM RN Visit Note  05/01/2022 Name: Jacob Herrera MRN: [id number] DOB: 07-06-1950  Subjective: Jacob Herrera is a 72 y.o. year old male who is a primary care patient of Caryn Section, [name], MD. The patient was referred to the Chronic Care Management team for assistance with care management needs subsequent to provider initiation of CCM services and plan of care.    Today's Visit:  Engaged with patient by telephone for initial visit.     SDOH Interventions Today    Flowsheet Row Most Recent Value  SDOH Interventions   Food Insecurity Interventions Intervention Not Indicated  Housing Interventions Intervention Not Indicated  Transportation Interventions Intervention Not Indicated  Utilities Interventions Intervention Not Indicated  Alcohol Usage Interventions Intervention Not Indicated (Score <7)  Financial Strain Interventions Intervention Not Indicated  Stress Interventions Intervention Not Indicated         Goals Addressed             This Visit's Progress    Goal: CCM (Diabetes) Expected Outcome:  Monitor, Self-Manage And Reduce Symptoms of Diabetes       Current Barriers:  Chronic Disease Management support and education needs related to Diabetes  Planned Interventions: Reviewed plan for Diabetes management Reviewed medications and discussed importance of medication adherence. Reports taking all medications as prescribed. Denies concerns related to medication management.  Discussed importance of consistent blood glucose monitoring. Currently using FreeStyle Libre for continuous blood glucose monitoring. Reports fasting blood sugar average of 111 mg/dl over the past two weeks. Reading was 95 mg/dl today. Discussed information regarding s/sx of hypoglycemia and hyperglycemia along with recommended interventions. Reports one lowest reading of 69 within the past two weeks. Denies hyperglycemic episodes. Discussed nutritional intake and  importance of complying with a diabetic diet. Reports preparing most meals at home. Discussed importance of increasing intake of vegetables and lean proteins. Advised to monitor intake of carbohydrates and avoid foods and beverages with added sugar. Declines need for additional nutritional resources. Discussed importance of completing recommended DM preventive care. Reports completing recommended foot care. Reports eye exam is up to date. Scheduled for follow up with Dr. Wallace Going on 01/29/23. Discussed importance of completing ordered labs as prescribed.  Assessed social determinant of health barriers.    Lab Results  Component Value Date   HGBA1C 6.1 (H) 04/14/2022    Symptom Management: Take medications as prescribed   Attend all scheduled provider appointments Call pharmacy for medication refills 3-7 days in advance of running out of medications Continue using the [place] reader to scan your sensor until your phone app is updated Check feet daily for cuts, sores or redness Wash and dry feet carefully every day Wear comfortable, cotton socks Wear comfortable, well-fitting shoes Read food labels for fat, fiber, carbohydrates and portion size Call provider office for new concerns or questions   Follow Up Plan:  Will follow up in three months       Goal: CCM (Hyperlipidemia) Expected Outcome:  Monitor, Self-Manage And Reduce Symptoms of Hyperlipidemia        Current Barriers:  Chronic Disease Management support and education needs related to HLD  Planned Interventions: Reviewed plan for management of hyperlipidemia. Reviewed established cholesterol goals reviewed. Reviewed medications. Reports taking medications as prescribed.  Discussed importance of regular laboratory monitoring as prescribed. Reviewed role and benefits of statin for ASCVD risk reduction. Reports tolerating statin well. Reviewed importance of limiting foods high in cholesterol. Reviewed exercise  goals. Reports  ability to engage in regular exercise is limited d/t degenerative joints. Encouraged to engage in low impact activities as tolerated. Reviewed s/sx of heart attack, stroke along with indications for seeking immediate medical attention.   Lab Results  Component Value Date   CHOL 131 04/14/2022   HDL 37 (L) 04/14/2022   LDLCALC 50 04/14/2022   TRIG 285 (H) 04/14/2022   CHOLHDL 3.5 04/14/2022    Symptom Management: Take medications as prescribed   Attend all scheduled provider appointments Call pharmacy for medication refills 3-7 days in advance of running out of medications Call doctor with any symptoms you believe are related to your medicine Adhere to prescribed diet Limit cholesterol intake Call provider office for new concerns or questions   Follow Up Plan: Will follow up in three months            Goal: CCM (Hypertension) Expected Outcome:  Monitor, Self-Manage And Reduce Symptoms of Hypertension        Current Barriers:  Chronic Disease Management support and education needs related to Hypertension  Planned Interventions: Reviewed current treatment plan related to Hypertension, self-management, and adherence to plan as established by provider.  Reviewed medications and indications for use. Reports having all needed medications and taking as prescribed. Provided information regarding established blood pressure parameters along with indications for notifying a provider. Advised to monitor consistently and record readings.  Reviewed symptoms. Denies chest pain or palpitations. Denies headaches, dizziness, or visual changes.  Discussed compliance with recommended cardiac prudent diet. Encouraged to read nutrition labels, continue monitoring sodium intake, and avoid highly processed foods when possible.  Reviewed s/sx of heart attack, stroke and worsening symptoms that require immediate medical attention.    BP Readings from Last 3 Encounters:  04/14/22 (!)  158/66  02/02/22 (!) 140/70  12/09/21 (!) 145/63    Symptom Management: Take medications as prescribed   Attend all scheduled provider appointments Call pharmacy for medication refills 3-7 days in advance of running out of medications Call provider office for new concerns or questions  Check blood pressure  Keep a blood pressure log Call doctor for signs and symptoms of high blood pressure Eat whole grains, fruits and vegetables, lean meats and healthy fats Limit sodium intake  Report new symptoms to your doctor  Follow Up Plan:  Will follow up in three months                 PLAN: A member of the care management team will follow up in three months   [place] Manager/Chronic Care Management [phone]

## 2022-05-01 NOTE — Plan of Care (Signed)
Chronic Care Management Provider Comprehensive Care Plan    05/01/2022 Name: Jacob Herrera MRN: [id number] DOB: 26-Feb-1951  Referral to Chronic Care Management (CCM) services was placed by Provider:  [name], MD on Date: 04/05/22.  Chronic Condition 1: DM Provider Assessment and Plan  Diabetes mellitus with nephropathy (HCC)   - Comprehensive metabolic panel - Hemoglobin A1c Expected Outcome/Goals Addressed This Visit (Provider CCM goals/Provider Assessment and plan  Goal: CCM (Diabetes) Expected Outcome:  Monitor, Self-Manage And Reduce Symptoms of Diabetes  Symptom Management Condition 1: Take medications as prescribed   Attend all scheduled provider appointments Call pharmacy for medication refills 3-7 days in advance of running out of medications Continue using the FreeStyle Libre reader to scan your sensor until your phone app is updated Check feet daily for cuts, sores or redness Wash and dry feet carefully every day Wear comfortable, cotton socks Wear comfortable, well-fitting shoes Read food labels for fat, fiber, carbohydrates and portion size Call provider office for new concerns or questions     Chronic Condition 2: HLD Provider Assessment and Plan  Hyperlipidemia, unspecified hyperlipidemia type Doing well with change from atorvastatin to rosuvastatin and feels that joint aches have improved.  - Lipid panel   Expected Outcome/Goals Addressed This Visit (Provider CCM goals/Provider Assessment and plan  Goal: CCM (Hyperlipidemia) Expected Outcome:  Monitor, Self-Manage And Reduce Symptoms of Hyperlipidemia     Symptom Management Condition 2: Take medications as prescribed   Attend all scheduled provider appointments Call pharmacy for medication refills 3-7 days in advance of running out of medications Call doctor with any symptoms you believe are related to your medicine Adhere to prescribed diet Limit cholesterol intake Call provider office for  new concerns or questions    Chronic Condition 3: Hypertension Provider Assessment and Plan  Resistant hypertension Fairly well controlled. Continue current medications.    Expected Outcome/Goals Addressed This Visit (Provider CCM goals/Provider Assessment and plan  Goal: CCM (Hypertension) Expected Outcome:  Monitor, Self-Manage And Reduce Symptoms of Hypertension  Symptom Management Condition 3: Take medications as prescribed   Attend all scheduled provider appointments Call pharmacy for medication refills 3-7 days in advance of running out of medications Call provider office for new concerns or questions  Check blood pressure  Keep a blood pressure log Call doctor for signs and symptoms of high blood pressure Eat whole grains, fruits and vegetables, lean meats and healthy fats Limit sodium intake  Report new symptoms to your doctor  Problem List Patient Active Problem List   Diagnosis Date Noted   Vitamin D deficiency 05/27/2019   Vitamin B12 deficiency 05/27/2019   Benign hypertensive kidney disease with chronic kidney disease 04/23/2019   Nephrotic syndrome, focal and segmental glomerular lesions 04/23/2019   Proteinuria 04/23/2019   Secondary hyperparathyroidism of renal origin ([place]) 04/23/2019   Trigger middle finger of right hand 01/20/2019   Carpal tunnel syndrome, right 01/20/2019   Focal segmental glomerulosclerosis 03/26/2018   Primary osteoarthritis of first carpometacarpal joint of right hand 07/30/2016   Coronary artery disease with unstable angina pectoris ([place]) 03/31/2015   Atherosclerotic heart disease of native coronary artery with unstable angina pectoris ([place]) 28/78/6767   Diastolic heart failure ([place]) 02/15/2015   Regional wall motion abnormality of heart 02/15/2015   Abnormal findings on diagnostic imaging of heart and coronary circulation 02/15/2015   Restrictive lung disease 11/10/2014   History of adenomatous polyp of colon 11/09/2014    Hypothyroidism 11/09/2014   Diabetes mellitus with nephropathy (  [place]) 11/09/2014   Hyperlipidemia 11/09/2014   Depression 11/09/2014   Insomnia 11/09/2014   Resistant hypertension 11/09/2014   GERD (gastroesophageal reflux disease) 11/09/2014   Chronic kidney disease (CKD), stage III (moderate) ([place]) 11/09/2014   Dyspnea on exertion 11/09/2014   History of basal cell cancer 09/15/2014   DDD (degenerative disc disease), lumbar 09/09/2014   Facet syndrome, lumbar 09/09/2014   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   Greater trochanteric bursitis of both hips 09/09/2014   Allergic rhinitis 08/12/2014   Benign prostatic hyperplasia with urinary obstruction 06/24/2013   Nocturia 05/30/2013   Frequency of micturition 05/30/2013   Urge incontinence 05/30/2013   Asthma, chronic 05/29/2013   Morbid obesity ([place]) 05/29/2013   OSA treated with BiPAP 05/29/2013   Scoliosis 11/28/2011   SCC (squamous cell carcinoma), face 08/01/2011    Medication Management  Current Outpatient Medications:    acetaminophen (TYLENOL) 500 MG tablet, Take 500 mg by mouth every 6 (six) hours as needed., Disp: , Rfl:    albuterol (PROVENTIL) (2.5 MG/3ML) 0.083% nebulizer solution, Take 3 mLs (2.5 mg total) by nebulization every 4 (four) hours. And PRN (Patient taking differently: Take 2.5 mg by nebulization as needed.), Disp: 75 mL, Rfl: 12   amLODipine (NORVASC) 10 MG tablet, Take 1 tablet (10 mg total) by mouth daily., Disp: 30 tablet, Rfl: 1   aspirin 81 MG tablet, Take 81 mg by mouth daily., Disp: , Rfl:    carvedilol (COREG) 25 MG tablet, TAKE 1 TABLET BY MOUTH TWICE DAILY, Disp: 180 tablet, Rfl: 4   cetirizine (ZYRTEC) 10 MG tablet, Take 10 mg by mouth daily., Disp: , Rfl:    cloNIDine (CATAPRES) 0.2 MG tablet, TAKE 1 TABLET BY MOUTH TWICE DAILY, Disp: 180 tablet, Rfl: 3   Continuous Blood Gluc Receiver (FREESTYLE LIBRE 2 READER) DEVI, USE TO MONITOR BLOOD SUGARS AS DIRECTED, Disp: 1 each, Rfl: 1   Continuous  Blood Gluc Sensor (FREESTYLE LIBRE 2 SENSOR) MISC, Place 1 sensor on the skin every 14 days to monitor blood sugars continuously, Disp: 6 each, Rfl: 3   Cyanocobalamin (VITAMIN B 12 PO), Take 1,000 mcg by mouth daily. , Disp: , Rfl:    dapagliflozin propanediol ([name]) 10 MG TABS tablet, Take 10 mg by mouth daily., Disp: , Rfl:    Dulaglutide (TRULICITY) 1.5 VZ/5.6LO SOPN, Inject 1.5 mg into the skin once a week. [place] patient assistance through Dec 2023, Disp: , Rfl:    erythromycin ophthalmic ointment, Apply 1 a small amount into right eye every night, Disp: , Rfl:    finasteride (PROSCAR) 5 MG tablet, Take 1 tablet (5 mg total) by mouth daily., Disp: 30 tablet, Rfl: 11   furosemide (LASIX) 40 MG tablet, Take 40 mg by mouth 2 (two) times daily. , Disp: , Rfl:    gabapentin (NEURONTIN) 600 MG tablet, TAKE 1 TABLET BY MOUTH 2 TIMES DAILY, Disp: 180 tablet, Rfl: 4   hydrALAZINE (APRESOLINE) 100 MG tablet, Take 1 tablet (100 mg total) by mouth 3 (three) times daily., Disp: 270 tablet, Rfl: 4   Insulin Glargine (BASAGLAR KWIKPEN) 100 UNIT/ML, Inject 21 Units into the skin daily. [place] patient assistance through Dec 2023, Disp: , Rfl:    insulin lispro (HUMALOG KWIKPEN) 100 UNIT/ML KwikPen, Inject 6-10 Units into the skin 3 (three) times daily. Sliding scale. Via [employer] patient assistance through Dec 2023, Disp: , Rfl:    Insulin Pen Needle (BD PEN NEEDLE NANO U/F) 32G X 4 MM MISC,  USE DAILY AS DIRECTED, Disp: 100 each, Rfl: 3   isosorbide mononitrate (IMDUR) 60 MG 24 hr tablet, TAKE 1 TABLET BY MOUTH DAILY, Disp: 90 tablet, Rfl: 4   levothyroxine (SYNTHROID) 125 MCG tablet, TAKE 1 TABLET EVERY DAY ON EMPTY STOMACHWITH A GLASS OF WATER AT LEAST 30-60 MINBEFORE BREAKFAST, Disp: 90 tablet, Rfl: 4   losartan (COZAAR) 100 MG tablet, Take 1 tablet (100 mg total) by mouth daily., Disp: 30 tablet, Rfl: 1   Lysine 500 MG CAPS, Take 1 capsule by mouth daily., Disp: , Rfl:    ONETOUCH  ULTRA test strip, USE AS DIRECTED THREE TIMES A DAY, Disp: 100 each, Rfl: 3   pantoprazole (PROTONIX) 40 MG tablet, TAKE ONE TABLET BY MOUTH DAILY BEFORE BREAKFAST, Disp: 30 tablet, Rfl: 0   rosuvastatin (CRESTOR) 20 MG tablet, Take 1 tablet (20 mg total) by mouth daily. (TAKE IN PLACE OF ATORVASTATIN AND CANCEL ATORVASTATIN REFILLS), Disp: 90 tablet, Rfl: 3   senna (SENOKOT) 8.6 MG tablet, Take by mouth., Disp: , Rfl:    SENNA PO, Take 1 capsule by mouth in the morning, at noon, in the evening, and at bedtime., Disp: , Rfl:    SYMBICORT 80-4.5 MCG/ACT inhaler, INHALE 2 PUFFS INTO LUNGS TWICE DAILY, Disp: 10.2 g, Rfl: 0   terazosin (HYTRIN) 5 MG capsule, TAKE 1 CAPSULE BY MOUTH DAILY. TAKE IN PLACE OF DOXAZOSIN., Disp: 90 capsule, Rfl: 4  Cognitive Assessment Identity Confirmed: : Name; DOB Cognitive Status: Normal   Functional Assessment Hearing Difficulty or Deaf: no Wear Glasses or Blind: yes Vision Management: Wears Glasses Concentrating, Remembering or Making Decisions Difficulty (CP): no Difficulty Communicating: no Difficulty Eating/Swallowing: no Walking or Climbing Stairs Difficulty: yes Walking or Climbing Stairs: stair climbing difficulty, requires equipment Mobility Management: Railing for stairs Dressing/Bathing Difficulty: no Doing Errands Independently Difficulty (such as shopping) (CP): no Change in Functional Status Since Onset of Current Illness/Injury: no   Caregiver Assessment  Primary Source of Support/Comfort: child(ren); friend People in Home: alone Family Caregiver if Needed: none   Planned Interventions  DM Reviewed plan for Diabetes management Reviewed medications and discussed importance of medication adherence. Reports taking all medications as prescribed. Denies concerns related to medication management.  Discussed importance of consistent blood glucose monitoring. Currently using FreeStyle Libre for continuous blood glucose monitoring. Reports  fasting blood sugar average of 111 mg/dl over the past two weeks. Reading was 95 mg/dl today. Discussed information regarding s/sx of hypoglycemia and hyperglycemia along with recommended interventions. Reports one lowest reading of 69 within the past two weeks. Denies hyperglycemic episodes. Discussed nutritional intake and importance of complying with a diabetic diet. Reports preparing most meals at home. Discussed importance of increasing intake of vegetables and lean proteins. Advised to monitor intake of carbohydrates and avoid foods and beverages with added sugar. Declines need for additional nutritional resources. Discussed importance of completing recommended DM preventive care. Reports completing recommended foot care. Reports eye exam is up to date. Scheduled for follow up with Dr. Wallace Going on 01/29/23. Discussed importance of completing ordered labs as prescribed.  Assessed social determinant of health barriers.    HLD Reviewed plan for management of hyperlipidemia. Reviewed established cholesterol goals reviewed. Reviewed medications. Reports taking medications as prescribed.  Discussed importance of regular laboratory monitoring as prescribed. Reviewed role and benefits of statin for ASCVD risk reduction. Reports tolerating statin well. Reviewed importance of limiting foods high in cholesterol. Reviewed exercise goals. Reports ability to engage in regular exercise is limited d/t degenerative  joints. Encouraged to engage in low impact activities as tolerated. Reviewed s/sx of heart attack, stroke along with indications for seeking immediate medical attention.   HTN Reviewed current treatment plan related to Hypertension, self-management, and adherence to plan as established by provider.  Reviewed medications and indications for use. Reports having all needed medications and taking as prescribed. Provided information regarding established blood pressure parameters along with  indications for notifying a provider. Advised to monitor consistently and record readings.  Reviewed symptoms. Denies chest pain or palpitations. Denies headaches, dizziness, or visual changes.  Discussed compliance with recommended cardiac prudent diet. Encouraged to read nutrition labels, continue monitoring sodium intake, and avoid highly processed foods when possible.  Reviewed s/sx of heart attack, stroke and worsening symptoms that require immediate medical attentio  Interaction and coordination with outside resources, practitioners, and providers See CCM Referral  Care Plan: Available in MyChart

## 2022-05-08 DIAGNOSIS — R600 Localized edema: Secondary | ICD-10-CM | POA: Diagnosis not present

## 2022-05-08 DIAGNOSIS — N184 Chronic kidney disease, stage 4 (severe): Secondary | ICD-10-CM | POA: Diagnosis not present

## 2022-05-08 DIAGNOSIS — I10 Essential (primary) hypertension: Secondary | ICD-10-CM | POA: Diagnosis not present

## 2022-05-08 DIAGNOSIS — N2581 Secondary hyperparathyroidism of renal origin: Secondary | ICD-10-CM | POA: Diagnosis not present

## 2022-05-08 DIAGNOSIS — R809 Proteinuria, unspecified: Secondary | ICD-10-CM | POA: Diagnosis not present

## 2022-05-08 DIAGNOSIS — E1122 Type 2 diabetes mellitus with diabetic chronic kidney disease: Secondary | ICD-10-CM | POA: Diagnosis not present

## 2022-05-17 DIAGNOSIS — Z20822 Contact with and (suspected) exposure to covid-19: Secondary | ICD-10-CM | POA: Diagnosis not present

## 2022-05-18 DIAGNOSIS — Z20822 Contact with and (suspected) exposure to covid-19: Secondary | ICD-10-CM | POA: Diagnosis not present

## 2022-05-24 DIAGNOSIS — E1159 Type 2 diabetes mellitus with other circulatory complications: Secondary | ICD-10-CM

## 2022-05-24 DIAGNOSIS — I10 Essential (primary) hypertension: Secondary | ICD-10-CM | POA: Diagnosis not present

## 2022-05-24 DIAGNOSIS — E785 Hyperlipidemia, unspecified: Secondary | ICD-10-CM

## 2022-05-30 DIAGNOSIS — Z20822 Contact with and (suspected) exposure to covid-19: Secondary | ICD-10-CM | POA: Diagnosis not present

## 2022-05-31 DIAGNOSIS — Z20822 Contact with and (suspected) exposure to covid-19: Secondary | ICD-10-CM | POA: Diagnosis not present

## 2022-06-01 ENCOUNTER — Telehealth: Payer: Self-pay | Admitting: Pulmonary Disease

## 2022-06-01 DIAGNOSIS — Z20822 Contact with and (suspected) exposure to covid-19: Secondary | ICD-10-CM | POA: Diagnosis not present

## 2022-06-02 ENCOUNTER — Other Ambulatory Visit: Payer: Self-pay | Admitting: Family Medicine

## 2022-06-02 DIAGNOSIS — I1A0 Resistant hypertension: Secondary | ICD-10-CM

## 2022-06-02 NOTE — Telephone Encounter (Signed)
Requested medication (s) are due for refill today - expired Rx  Requested medication (s) are on the active medication list -yes  Future visit scheduled -yes  Last refill: 03/12/21  Notes to clinic: expired Rx-OV 04/14/22  Requested Prescriptions  Pending Prescriptions Disp Refills   carvedilol (COREG) 25 MG tablet [Pharmacy Med Name: CARVEDILOL 25 MG TAB] 180 tablet 4    Sig: TAKE 1 TABLET BY MOUTH TWICE DAILY     Cardiovascular: Beta Blockers 3 Failed - 06/02/2022  2:32 PM      Failed - Cr in normal range and within 360 days    Creatinine  Date Value Ref Range Status  07/08/2013 1.36 (H) 0.60 - 1.30 mg/dL Final   Creatinine, Ser  Date Value Ref Range Status  04/14/2022 2.19 (H) 0.76 - 1.27 mg/dL Final         Failed - Last BP in normal range    BP Readings from Last 1 Encounters:  04/14/22 (!) 158/66         Passed - AST in normal range and within 360 days    AST  Date Value Ref Range Status  04/14/2022 22 0 - 40 IU/L Final   SGOT(AST)  Date Value Ref Range Status  07/04/2013 32 15 - 37 Unit/L Final         Passed - ALT in normal range and within 360 days    ALT  Date Value Ref Range Status  04/14/2022 22 0 - 44 IU/L Final   SGPT (ALT)  Date Value Ref Range Status  07/04/2013 34 12 - 78 U/L Final         Passed - Last Heart Rate in normal range    Pulse Readings from Last 1 Encounters:  04/14/22 62         Passed - Valid encounter within last 6 months    Recent Outpatient Visits           1 month ago Hyperlipidemia, unspecified hyperlipidemia type   [facility] [name], MD   5 months ago Prostate cancer screening   [place], Donald E, MD   9 months ago Resistant hypertension   [place], Donald E, MD   1 year ago Annual physical exam   [place], Donald E, MD   1 year ago Upper respiratory tract infection,  unspecified type   [place], Donald E, MD       Future Appointments             In 2 months [name], [name], [facility]   In 5 months Stoioff, Ronda Fairly, MD [facility] [place]               Requested Prescriptions  Pending Prescriptions Disp Refills   carvedilol (COREG) 25 MG tablet [Pharmacy Med Name: CARVEDILOL 25 MG TAB] 180 tablet 4    Sig: TAKE 1 TABLET BY MOUTH TWICE DAILY     Cardiovascular: Beta Blockers 3 Failed - 06/02/2022  2:32 PM      Failed - Cr in normal range and within 360 days    Creatinine  Date Value Ref Range Status  07/08/2013 1.36 (H) 0.60 - 1.30 mg/dL Final   Creatinine, Ser  Date Value Ref Range Status  04/14/2022 2.19 (H) 0.76 - 1.27 mg/dL Final  Failed - Last BP in normal range    BP Readings from Last 1 Encounters:  04/14/22 (!) 158/66         Passed - AST in normal range and within 360 days    AST  Date Value Ref Range Status  04/14/2022 22 0 - 40 IU/L Final   SGOT(AST)  Date Value Ref Range Status  07/04/2013 32 15 - 37 Unit/L Final         Passed - ALT in normal range and within 360 days    ALT  Date Value Ref Range Status  04/14/2022 22 0 - 44 IU/L Final   SGPT (ALT)  Date Value Ref Range Status  07/04/2013 34 12 - 78 U/L Final         Passed - Last Heart Rate in normal range    Pulse Readings from Last 1 Encounters:  04/14/22 62         Passed - Valid encounter within last 6 months    Recent Outpatient Visits           1 month ago Hyperlipidemia, unspecified hyperlipidemia type   [facility] [name], MD   5 months ago Prostate cancer screening   [place], Donald E, MD   9 months ago Resistant hypertension   [place], Donald E, MD   1 year ago Annual physical exam   [place],  Donald E, MD   1 year ago Upper respiratory tract infection, unspecified type   [place], Donald E, MD       Future Appointments             In 2 months [name], [name], MD [facility] [place]   In 5 months Stoioff, Ronda Fairly, MD [place]

## 2022-06-04 DIAGNOSIS — Z20822 Contact with and (suspected) exposure to covid-19: Secondary | ICD-10-CM | POA: Diagnosis not present

## 2022-06-06 DIAGNOSIS — Z20822 Contact with and (suspected) exposure to covid-19: Secondary | ICD-10-CM | POA: Diagnosis not present

## 2022-06-07 DIAGNOSIS — Z20822 Contact with and (suspected) exposure to covid-19: Secondary | ICD-10-CM | POA: Diagnosis not present

## 2022-06-07 DIAGNOSIS — M65341 Trigger finger, right ring finger: Secondary | ICD-10-CM | POA: Diagnosis not present

## 2022-06-10 DIAGNOSIS — Z20822 Contact with and (suspected) exposure to covid-19: Secondary | ICD-10-CM | POA: Diagnosis not present

## 2022-06-13 ENCOUNTER — Other Ambulatory Visit: Payer: Self-pay | Admitting: Family Medicine

## 2022-06-13 DIAGNOSIS — C44311 Basal cell carcinoma of skin of nose: Secondary | ICD-10-CM | POA: Diagnosis not present

## 2022-06-13 DIAGNOSIS — L821 Other seborrheic keratosis: Secondary | ICD-10-CM | POA: Diagnosis not present

## 2022-06-13 DIAGNOSIS — Z872 Personal history of diseases of the skin and subcutaneous tissue: Secondary | ICD-10-CM | POA: Diagnosis not present

## 2022-06-13 DIAGNOSIS — X32XXXA Exposure to sunlight, initial encounter: Secondary | ICD-10-CM | POA: Diagnosis not present

## 2022-06-13 DIAGNOSIS — C44519 Basal cell carcinoma of skin of other part of trunk: Secondary | ICD-10-CM | POA: Diagnosis not present

## 2022-06-13 DIAGNOSIS — C44612 Basal cell carcinoma of skin of right upper limb, including shoulder: Secondary | ICD-10-CM | POA: Diagnosis not present

## 2022-06-13 DIAGNOSIS — D0462 Carcinoma in situ of skin of left upper limb, including shoulder: Secondary | ICD-10-CM | POA: Diagnosis not present

## 2022-06-13 DIAGNOSIS — D2262 Melanocytic nevi of left upper limb, including shoulder: Secondary | ICD-10-CM | POA: Diagnosis not present

## 2022-06-13 DIAGNOSIS — Z85828 Personal history of other malignant neoplasm of skin: Secondary | ICD-10-CM | POA: Diagnosis not present

## 2022-06-13 DIAGNOSIS — Z85820 Personal history of malignant melanoma of skin: Secondary | ICD-10-CM | POA: Diagnosis not present

## 2022-06-13 DIAGNOSIS — C44619 Basal cell carcinoma of skin of left upper limb, including shoulder: Secondary | ICD-10-CM | POA: Diagnosis not present

## 2022-06-13 DIAGNOSIS — E1121 Type 2 diabetes mellitus with diabetic nephropathy: Secondary | ICD-10-CM

## 2022-06-13 DIAGNOSIS — C44212 Basal cell carcinoma of skin of right ear and external auricular canal: Secondary | ICD-10-CM | POA: Diagnosis not present

## 2022-06-13 DIAGNOSIS — D485 Neoplasm of uncertain behavior of skin: Secondary | ICD-10-CM | POA: Diagnosis not present

## 2022-06-13 DIAGNOSIS — L570 Actinic keratosis: Secondary | ICD-10-CM | POA: Diagnosis not present

## 2022-06-16 DIAGNOSIS — M65341 Trigger finger, right ring finger: Secondary | ICD-10-CM | POA: Diagnosis not present

## 2022-07-25 ENCOUNTER — Ambulatory Visit: Payer: PPO

## 2022-07-25 DIAGNOSIS — E1121 Type 2 diabetes mellitus with diabetic nephropathy: Secondary | ICD-10-CM

## 2022-07-25 DIAGNOSIS — I5032 Chronic diastolic (congestive) heart failure: Secondary | ICD-10-CM

## 2022-07-25 MED ORDER — FREESTYLE LIBRE 3 SENSOR MISC: 3 refills | Status: DC

## 2022-07-25 MED ORDER — BASAGLAR KWIKPEN 100 UNIT/ML ~~LOC~~ SOPN
25.0000 [IU] | PEN_INJECTOR | Freq: Every day | SUBCUTANEOUS | Status: DC
Start: 2022-07-25 — End: 2023-02-26

## 2022-07-25 MED ORDER — FREESTYLE LIBRE 3 READER DEVI
1.0000 | Freq: Once | 0 refills | Status: AC
Start: 2022-07-25 — End: 2022-07-25

## 2022-07-25 NOTE — Progress Notes (Unsigned)
Care Management & Coordination Services Pharmacy Note  07/25/2022 Name:  Jacob Herrera MRN:  [id number] DOB:  14-Oct-1950  Summary: Patient presents for follow-up consult.   Blood sugars controlled overall, patient notes his blood sugars tend to run higher in the evening and overnight.   Recommendations/Changes made from today's visit: -INCREASE Basaglar 25 units nightly.  -Start Freestyle [place] 3 system.   Follow up plan: CPP follow-up 1 month.   Subjective: Jacob Herrera is an 72 y.o. year old male who is a primary patient of [name], [name], MD.  The care coordination team was consulted for assistance with disease management and care coordination needs.    Engaged with patient by telephone for follow up visit.  Recent office visits: 04/14/22: Patient presented to Dr. Caryn Section for follow-up.   Recent consult visits: 05/08/22: Patient presented to Dr. Candiss Norse (nephrology) for follow-up.   Hospital visits: None in previous 6 months   Objective:  Lab Results  Component Value Date   CREATININE 2.19 (H) 04/14/2022   BUN 52 (H) 04/14/2022   EGFR 31 (L) 04/14/2022   GFRNONAA 33 11/08/2020   GFRAA 42 (L) 10/07/2018   NA 137 04/14/2022   K 4.9 04/14/2022   CALCIUM 10.4 (H) 04/14/2022   CO2 22 04/14/2022   GLUCOSE 152 (H) 04/14/2022    Lab Results  Component Value Date/Time   HGBA1C 6.1 (H) 04/14/2022 03:26 PM   HGBA1C 6.2 (H) 12/09/2021 03:24 PM   MICROALBUR 92 08/23/2021 12:00 AM   MICROALBUR neg 03/20/2017 02:25 PM    Last diabetic Eye exam:  Lab Results  Component Value Date/Time   HMDIABEYEEXA No Retinopathy 01/26/2022 02:39 PM    Last diabetic Foot exam: No results found for: "HMDIABFOOTEX"   Lab Results  Component Value Date   CHOL 131 04/14/2022   HDL 37 (L) 04/14/2022   LDLCALC 50 04/14/2022   TRIG 285 (H) 04/14/2022   CHOLHDL 3.5 04/14/2022       Latest Ref Rng & Units 04/14/2022    3:26 PM 12/09/2021    3:24 PM 11/08/2020   12:00 AM   Hepatic Function  Total Protein 6.0 - 8.5 g/dL 7.2  7.3    Albumin 3.8 - 4.8 g/dL 4.6  4.4  3.9      AST 0 - 40 IU/L 22  24    ALT 0 - 44 IU/L 22  23    Alk Phosphatase 44 - 121 IU/L 55  62    Total Bilirubin 0.0 - 1.2 mg/dL 0.2  0.3       This result is from an external source.    Lab Results  Component Value Date/Time   TSH 1.950 12/09/2021 03:24 PM   TSH 3.380 11/12/2020 02:14 PM   FREET4 1.34 12/09/2021 03:24 PM   FREET4 1.30 11/12/2020 02:14 PM       Latest Ref Rng & Units 12/09/2021    3:24 PM 11/08/2020   12:00 AM 11/09/2014   10:30 AM  CBC  WBC 3.4 - 10.8 x10E3/uL 5.9  6.1     6.6   Hemoglobin 13.0 - 17.7 g/dL 11.2  10.7     12.4   Hematocrit 37.5 - 51.0 % 34.8  32     38.9   Platelets 150 - 450 x10E3/uL 239  249     241      This result is from an external source.    Lab Results  Component Value Date/Time   VD25OH 35.8  12/09/2021 03:24 PM   VD25OH 22.8 (L) 05/26/2019 04:16 PM   VITAMINB12 1,012 12/09/2021 03:24 PM   VITAMINB12 208 (L) 05/26/2019 04:16 PM    Clinical ASCVD: No  The 10-year ASCVD risk score (Arnett DK, et al., 2019) is: 44.5%   Values used to calculate the score:     Age: 72 years     Sex: Male     Is Non-Hispanic African American: No     Diabetic: Yes     Tobacco smoker: No     Systolic Blood Pressure: [id number]     Is BP treated: Yes     HDL Cholesterol: 37 mg/dL     Total Cholesterol: 131 mg/dL       05/01/2022    4:28 PM 05/13/2021    2:58 PM 11/12/2020    1:45 PM  Depression screen PHQ 2/9  Decreased Interest 0 0 0  Down, Depressed, Hopeless 0 0 0  PHQ - 2 Score 0 0 0  Altered sleeping  0 1  Tired, decreased energy  1 2  Change in appetite  0 0  Feeling bad or failure about yourself   0 0  Trouble concentrating  0 0  Moving slowly or fidgety/restless  0 0  Suicidal thoughts  0 0  PHQ-9 Score  1 3  Difficult doing work/chores  Not difficult at all Not difficult at all     Social History   Tobacco Use  Smoking Status  Former   Packs/day: 1.00   Years: 25.00   Additional pack years: 0.00   Total pack years: 25.00   Types: Cigarettes   Quit date: 05/29/1988   Years since quitting: 34.1  Smokeless Tobacco Current   Types: Chew  Tobacco Comments   chews 1 bag of loose leaf chew/week   BP Readings from Last 3 Encounters:  04/14/22 (!) 158/66  02/02/22 (!) 140/70  12/09/21 (!) 145/63   Pulse Readings from Last 3 Encounters:  04/14/22 62  02/02/22 76  12/09/21 (!) 58   Wt Readings from Last 3 Encounters:  04/14/22 252 lb (114.3 kg)  02/02/22 250 lb (113.4 kg)  12/09/21 248 lb (112.5 kg)   BMI Readings from Last 3 Encounters:  04/14/22 38.32 kg/m  02/02/22 38.01 kg/m  12/09/21 37.71 kg/m    Allergies  Allergen Reactions   Chlorthalidone Other (See Comments)    D/c by dr. Juleen China for worsening renal function 03/2019   Spironolactone Other (See Comments)    gynecomastia    Medications Reviewed Today     Reviewed by Neldon Labella, RN (Registered Nurse) on 05/01/22 at [place] List Status: <None>   Medication Order Taking? Sig Documenting Provider Last Dose Status Informant  acetaminophen (TYLENOL) 500 MG tablet [id number] No Take 500 mg by mouth every 6 (six) hours as needed. [provider] Taking Active   albuterol (PROVENTIL) (2.5 MG/3ML) 0.083% nebulizer solution [id number] No Take 3 mLs (2.5 mg total) by nebulization every 4 (four) hours. And PRN  Patient taking differently: Take 2.5 mg by nebulization as needed.   Flora Lipps, MD Taking Active            Med Note [facility], [place] Comment S   Tue May 11, 2020  8:29 AM) PRN, hasnt had to use in 2-3 months  amLODipine (NORVASC) 10 MG tablet [id number] No Take 1 tablet (10 mg total) by mouth daily. Teodoro Spray, MD Taking Active   aspirin 81 MG tablet [id number] No Take  81 mg by mouth daily. [provider] Taking Active   carvedilol (COREG) 25 MG tablet [id number] No TAKE 1 TABLET BY MOUTH TWICE DAILY [name], [name],  MD Taking Active   cetirizine (ZYRTEC) 10 MG tablet [id number] No Take 10 mg by mouth daily. [provider] Taking Active   cloNIDine (CATAPRES) 0.2 MG tablet [id number] No TAKE 1 TABLET BY MOUTH TWICE DAILY [name], [name], MD Taking Active   Continuous Blood Gluc Receiver (FREESTYLE LIBRE 2 READER) [place] [id number] No USE TO MONITOR BLOOD SUGARS AS DIRECTED [name], MD Taking Active   Continuous Blood Gluc Sensor (FREESTYLE LIBRE 2 SENSOR) [place] [id number] No Place 1 sensor on the skin every 14 days to monitor blood sugars continuously [name], MD Taking Active   Cyanocobalamin (VITAMIN B 12 PO) [id number] No Take 1,000 mcg by mouth daily.  [provider] Taking Active Self  dapagliflozin propanediol ([name]) 10 MG TABS tablet [id number] No Take 10 mg by mouth daily. [provider] Taking Active   Dulaglutide (TRULICITY) 1.5 0000000 SOPN [id number] No Inject 1.5 mg into the skin once a week. [place] patient assistance through Dec 2023 [name], MD Taking Active   erythromycin ophthalmic ointment [id number] No Apply 1 a small amount into right eye every night [provider] Taking Active   finasteride (PROSCAR) 5 MG tablet [id number] No Take 1 tablet (5 mg total) by mouth daily. Stoioff, Ronda Fairly, MD Taking Active   furosemide (LASIX) 40 MG tablet [id number] No Take 40 mg by mouth 2 (two) times daily.  [provider] Taking Active   gabapentin (NEURONTIN) 600 MG tablet [id number]  TAKE 1 TABLET BY MOUTH 2 TIMES DAILY [name], MD  Active   hydrALAZINE (APRESOLINE) 100 MG tablet [id number] No Take 1 tablet (100 mg total) by mouth 3 (three) times daily. [name], MD Taking Active   Insulin Glargine [facility]) 100 UNIT/ML [id number] No Inject 21 Units into the skin daily. [place] patient assistance through Dec 2023 [name], MD Taking Active   insulin lispro (HUMALOG KWIKPEN) 100 UNIT/ML KwikPen  [id number] No Inject 6-10 Units into the skin 3 (three) times daily. Sliding scale. [facility] patient assistance through Dec 2023 [name], MD Taking Active   Insulin Pen Needle (BD PEN NEEDLE NANO U/F) 32G X 4 MM MISC [id number] No USE DAILY AS DIRECTED [name], MD Taking Active   isosorbide mononitrate (IMDUR) 60 MG 24 hr tablet [id number] No TAKE 1 TABLET BY MOUTH DAILY [name], MD Taking Active   levothyroxine (SYNTHROID) 125 MCG tablet [id number] No TAKE 1 TABLET EVERY DAY ON EMPTY STOMACHWITH A GLASS OF WATER AT LEAST 30-60 MINBEFORE BREAKFAST [name], MD Taking Active   losartan (COZAAR) 100 MG tablet [id number] No Take 1 tablet (100 mg total) by mouth daily. Teodoro Spray, MD Taking Active   Lysine 500 MG CAPS [id number] No Take 1 capsule by mouth daily. [provider] Taking Active   [facility] ULTRA test strip [id number] No USE AS DIRECTED THREE TIMES A DAY [name], MD Taking Active   pantoprazole (PROTONIX) 40 MG tablet [id number] No TAKE ONE TABLET BY MOUTH DAILY BEFORE BREAKFAST [name], MD Taking Active   rosuvastatin (CRESTOR) 20 MG tablet [id number] No Take 1 tablet (20 mg total) by mouth daily. (TAKE IN PLACE OF ATORVASTATIN AND CANCEL ATORVASTATIN REFILLS) [name], MD Taking  Active   senna (SENOKOT) 8.6 MG tablet [id number] No Take by mouth. [provider] Taking Active Self  SENNA PO [id number] No Take 1 capsule by mouth in the morning, at noon, in the evening, and at bedtime. [provider] Taking Active Self  SYMBICORT 80-4.5 MCG/ACT inhaler [id number] No INHALE 2 PUFFS INTO LUNGS TWICE DAILY Flora Lipps, MD Taking Active Self           Med Note Michaelle Birks, [place] A   Mon Aug 15, 2021  3:06 PM) Takes PRN  terazosin (HYTRIN) 5 MG capsule [id number] No TAKE 1 CAPSULE BY MOUTH DAILY. TAKE IN PLACE OF DOXAZOSIN. [name], MD Taking Active   Med List Note Margarita Mail, RN 03/31/15 1507): UDS  done 08-11-14 Meds to last until 10-09-14            SDOH:  (Social Determinants of Health) assessments and interventions performed: Yes SDOH Interventions    Flowsheet Row Chronic Care Management from 05/01/2022 in [place] Most recent reading at 05/01/2022  4:29 PM Chronic Care Management from 05/09/2021 in [place] Most recent reading at 05/24/2021 11:46 AM Office Visit from 05/13/2021 in [place] Most recent reading at 05/13/2021  2:58 PM Chronic Care Management from 03/29/2021 in [place] Most recent reading at 03/29/2021  2:26 PM Chronic Care Management from 01/04/2021 in [place] Most recent reading at 01/04/2021  2:19 PM Office Visit from 11/12/2020 in [place] Most recent reading at 11/12/2020  1:45 PM  SDOH Interventions        Food Insecurity Interventions Intervention Not Indicated -- -- -- -- --  Housing Interventions Intervention Not Indicated -- -- -- -- --  Transportation Interventions Intervention Not Indicated -- -- -- -- --  Utilities Interventions Intervention Not Indicated -- -- -- -- --  Alcohol Usage Interventions Intervention Not Indicated (Score <7) -- -- -- -- --  Depression Interventions/Treatment  -- -- PHQ2-9 Score <4 Follow-up Not Indicated -- -- PHQ2-9 Score <4 Follow-up Not Indicated  Financial Strain Interventions Intervention Not Indicated Intervention Not Indicated -- Other (Comment)  [PAP] Intervention Not Indicated --  Stress Interventions Intervention Not Indicated -- -- -- -- --       Medication Assistance:  Trulicity, Humalog, Basaglar obtained through [employer] medication assistance program.  Enrollment ends Dec 2024  [name] obtained through [employer] medication assistance program.  Enrollment ends Dec 2024  Medication Access: Within the past 30 days, how often has patient  missed a dose of medication? None Is a pillbox or other method used to improve adherence? Yes  Factors that may affect medication adherence? no barriers identified Are meds synced by current pharmacy? No  Are meds delivered by current pharmacy? No  Does patient experience delays in picking up medications due to transportation concerns? No   [facility] Services Reviewed: Is patient disadvantaged to use [facility]?: No  Current Rx insurance plan: H[employer] Name and location of Current pharmacy:  [place], [place] - [address] [place] [address] Phone: [phone] Fax: [phone]  [facility] services reviewed with patient today?: No  Patient requests to transfer care to [facility]?: No  Reason patient declined to change pharmacies: Loyalty to other pharmacy/Patient preference  Compliance/Adherence/Medication fill history: Care Gaps: Tdap  Covid  AWV  UACR   Star-Rating Drugs: Losartan 100  mg: 04/29/22 for 90-DS  Rosuvastatin 20 mg: 06/13/22 for 90-DS    Assessment/Plan  Diabetes (A1c goal <8%) -Controlled -Current medications: Trulicity 1.5 mg weekly (fridays): Appropriate, Effective, Safe, Accessible  Humalog sliding scale (4-8 units typically) -adjusts based on carb intake: Appropriate, Effective, Safe, Accessible   8 Units before breakfast most days  Basaglar 23 units daily: Appropriate, Effective, Query Safe, Accessible   [name] 10 mg daily: Appropriate, Effective, Safe, Accessible   -Medications previously tried: Bydureon, Glipizide, Victoza, metformin, Ozempic, Actos, Januvia, Invokana -Current home glucose readings (Uses Freestyle Libre 2)   Target 7/12-7/29 10/11-10/17 11/12-11/25  Number of days worn ? 14 days  14 14  % of time active ? 70% 92% 89% 90%  Mean Glucose (mg/dL)  113 123 122  GMI (2-week A1c estimate)  6.0% 6.3% 6.2%  Glycemic Variability (%CV) ?36% 30.9% 21.8% 24.6%  Time above >250 mg/dL  <10% 0% 2% 0%  Time above 70-180 mg/dL <50% 4% 98% 2%  Time in range: 70-180 mg/dL >50% 89% 0% 98%  Time below 70 mg/dL <1% 7% 0% 0%  14-day average: 135  -Reports hypoglycemic symptoms. 8 instances of hypoglycemia in past 2 weeks, with low noted on CGM report of 53.  -Current dietary patterns:  Tilapia + green beans  -Current exercise: Sedentary. Patient is limited by sciatica, neuropathy pain.  -INCREASE Basaglar 25 units nightly.  -Start Freestyle [place] 3 system.   Heart Failure (Goal: manage symptoms and prevent exacerbations) -Controlled -Last ejection fraction: 50% -HF type: Diastolic -NYHA Class: II (slight limitation of activity) -AHA HF Stage: C (Heart disease and symptoms present) -Current treatment: Amlodipine 10 mg daily  Carvedilol 25 mg twice daily  Clonidine 0.2 mg twice daily   Furosemide 40 mg 2 AM, 1 PM.  Hydralazine 100 mg three times daily  Imdur 60 mg daily  Losartan 100 mg nightly  Terazosin 5 mg daily -Medications previously tried: Chlorthalidone, spironolactone, nebivolol   -Current home BP/HR readings: NA, does not monitor at home. -Recommended to continue current medication  Hyperlipidemia: (LDL goal < 70) -Controlled -History of CAD s/p PCI 2016 -Current treatment: Rosuvastatin 20 mg daily: Appropriate, Effective, Query Safe, Accessible   -Current treatment: Aspirin 81 mg daily: Appropriate, Effective, Query Safe, Accessible -Medications previously tried: NA  -Educated on Importance of limiting foods high in cholesterol; -Recommended to continue current medication  Hypothyroidism (Goal: Maintain stable thyroid function) -Controlled -Current treatment  Levothyroxine 125 mcg daily before breakfast  -Medications previously tried: NA  -Recommended to continue current medication  Asthma with allergic rhinitis (Goal: control symptoms and prevent exacerbations) -Controlled -Current treatment  Symbicort 2 puffs twice daily as needed   -Medications previously tried: NA  -Exacerbations requiring treatment in last 6 months: None -Frequency of rescue inhaler use: Infrequent -Recommended to continue current medication  Chronic Kidney Disease Stage 3b-4  -All medications assessed for renal dosing and appropriateness in chronic kidney disease. -Recommended to continue current medication  [name], PharmD, [place], [place] [phone]

## 2022-08-08 ENCOUNTER — Ambulatory Visit: Payer: PPO | Admitting: Pulmonary Disease

## 2022-08-08 ENCOUNTER — Encounter: Payer: Self-pay | Admitting: Pulmonary Disease

## 2022-08-08 VITALS — BP 140/70 | HR 65 | Temp 98.0°F | Ht 68.0 in | Wt 251.6 lb

## 2022-08-08 DIAGNOSIS — G4733 Obstructive sleep apnea (adult) (pediatric): Secondary | ICD-10-CM | POA: Diagnosis not present

## 2022-08-08 DIAGNOSIS — E1122 Type 2 diabetes mellitus with diabetic chronic kidney disease: Secondary | ICD-10-CM | POA: Diagnosis not present

## 2022-08-08 DIAGNOSIS — R600 Localized edema: Secondary | ICD-10-CM | POA: Diagnosis not present

## 2022-08-08 DIAGNOSIS — N2581 Secondary hyperparathyroidism of renal origin: Secondary | ICD-10-CM | POA: Diagnosis not present

## 2022-08-08 DIAGNOSIS — N184 Chronic kidney disease, stage 4 (severe): Secondary | ICD-10-CM | POA: Diagnosis not present

## 2022-08-08 DIAGNOSIS — I10 Essential (primary) hypertension: Secondary | ICD-10-CM | POA: Diagnosis not present

## 2022-08-08 DIAGNOSIS — R809 Proteinuria, unspecified: Secondary | ICD-10-CM | POA: Diagnosis not present

## 2022-08-08 NOTE — Patient Instructions (Signed)
Will arrange for a new auto Bipap machine and for an overnight oximetry with your new Bipap machine  Follow up in 3 to 4 months

## 2022-08-08 NOTE — Progress Notes (Signed)
[facility]  Chief Complaint  Patient presents with   Follow-up    Wearing bipap nightly- feels pressure could be adjusted. OCC gassy in the morning.     Past Surgical History:  He  has a past surgical history that includes Nasal sinus surgery (2005); Tonsillectomy and adenoidectomy (1950); Cardiac catheterization (N/A, 03/31/2015); Cardiac catheterization (N/A, 03/31/2015); Cardiac catheterization (N/A, 03/31/2015); Mohs surgery (Left, 05/12/2019); Cataract extraction w/PHACO (Right, 05/11/2020); Cataract extraction w/PHACO (Left, 06/02/2020); Esophagogastroduodenoscopy (egd) with propofol (N/A, 04/04/2021); and Colonoscopy with propofol (N/A, 04/04/2021).  Past Medical History:  CHF, Back pain, DM type 2, HTN, Hypothyroidism, CKD, Neuropathy, Scoliosis  Constitutional:  BP (!) 140/70 (BP Location: Left Arm, Cuff Size: Normal)   Pulse 65   Temp 98 F (36.7 C) (Temporal)   Ht  (1.727 m)   Wt 251 lb 9.6 oz (114.1 kg)   SpO2 97%   BMI 38.26 kg/m   Brief Summary:  Jacob Herrera is a 72 y.o. male former smoker with obstructive sleep apnea.       Subjective:   He was previously seen by Dr. Belia Heman.  He had diagnostic study in 2017 that showed severe sleep apnea.  Eventually started on Bipap.  He had most recent titration study in 2022 that showed Bipap 16/10 cm H2O was adequate setting.  His current download shows he is using an auto Bipap.  He has trouble with mouth dryness and bloating.  He had several surgeries on his nose and isn't able to tolerate nasal masks.  He has been using a full face mask.  He was to have his Bipap changed to 20/10 cm H2O, but his current download shows his settings are max IPAP 25, min EPAP 12, and PS 10 cm H2O.  He has oxygen at home and was supposed to be using this with Bipap.  He stopped using oxygen to see if this helped his bloating.  He didn't feel like oxygen was helping his sleep, and never started using  it again.  Physical Exam:   Appearance - well kempt   ENMT - no sinus tenderness, no oral exudate, no LAN, Mallampati 2 airway, no stridor, high arched palate  Respiratory - equal breath sounds bilaterally, no wheezing or rales  CV - s1s2 regular rate and rhythm, no murmurs  Ext - no clubbing, no edema  Skin - no rashes  Psych - normal mood and affect   Pulmonary testing:  PFT 06/05/13 >> FEV 1 1.95 (67%), FEV1% 82, TLC 3.91 (64%), DLCO 54%  Sleep Tests:  PSG 05/06/15 >> AHI 131.8, SpO2 low 61.9% Bipap 02/10/21 >> Bipap 16/10 cm H2O Auto Bipap 05/10/22 to 08/07/22 >> used on 90 of 90 nights with average 8 hrs 26 min.  Average AHI 34.8 with median Bipap 22/12 and 95 th percentile Bipap 24/14 cm H2O  Cardiac Tests:  Echo 08/29/17 >> EF 50%, mild LVH, mild MR  Social History:  He  reports that he quit smoking about 34 years ago. His smoking use included cigarettes. He has a 25.00 pack-year smoking history. His smokeless tobacco use includes chew. He reports that he does not drink alcohol and does not use drugs.  Family History:  His family history includes Breast cancer in his mother; Cancer - Lung in his father; Cancer - Ovarian in his mother; Emphysema in his father.     Assessment/Plan:   Obstructive sleep apnea. - he is compliant with Bipap and reports benefit  from therapy - he uses [employer] for his DME - his current Bipap is more than 74 yrs old - he is having trouble with aerophagia and his AHI remains elevated - will arrange for a new Resmed auto Bipap with max IPAP 22, min EPAP 5, PS 6 cm H2O - will arrange for overnight oximetry with new auto Bipap machine and then determine if he should consider restarting 2 liters oxygen at night with Bipap  Time Spent Involved in Patient Care on Day of Examination:  35 minutes  Follow up:   Patient Instructions  Will arrange for a new auto Bipap machine and for an overnight oximetry with your new Bipap machine  Follow up in 3 to  4 months  Medication List:   Allergies as of 08/08/2022       Reactions   Chlorthalidone Other (See Comments)   D/c by dr. [name] for worsening renal function 03/2019   Spironolactone Other (See Comments)   gynecomastia        Medication List        Accurate as of August 08, 2022  3:52 PM. If you have any questions, ask your nurse or doctor.          acetaminophen 500 MG tablet Commonly known as: TYLENOL Take 500 mg by mouth every 6 (six) hours as needed.   albuterol (2.5 MG/3ML) 0.083% nebulizer solution Commonly known as: PROVENTIL Take 3 mLs (2.5 mg total) by nebulization every 4 (four) hours. And PRN What changed:  when to take this reasons to take this additional instructions   amLODipine 10 MG tablet Commonly known as: NORVASC Take 1 tablet (10 mg total) by mouth daily.   aspirin 81 MG tablet Take 81 mg by mouth daily.   Basaglar KwikPen 100 UNIT/ML Inject 25 Units into the skin at bedtime. Via [employer] patient assistance through Dec 2024   BD Pen Needle Nano U/F 32G X 4 MM Misc Generic drug: Insulin Pen Needle USE DAILY AS DIRECTED   carvedilol 25 MG tablet Commonly known as: COREG TAKE 1 TABLET BY MOUTH TWICE DAILY   cetirizine 10 MG tablet Commonly known as: ZYRTEC Take 10 mg by mouth daily.   cloNIDine 0.2 MG tablet Commonly known as: CATAPRES TAKE 1 TABLET BY MOUTH TWICE DAILY   dapagliflozin propanediol 10 MG Tabs tablet Commonly known as: [name] Take 10 mg by mouth daily.   erythromycin ophthalmic ointment Apply 1 a small amount into right eye every night   finasteride 5 MG tablet Commonly known as: PROSCAR Take 1 tablet (5 mg total) by mouth daily.   FreeStyle Libre 3 Sensor Misc Place sensor on the arm every 14 days and use to monitor blood sugars continuously as directed.   furosemide 40 MG tablet Commonly known as: LASIX Take 40 mg by mouth 2 (two) times daily. 2tab AM and 1tab PM.   gabapentin 600 MG  tablet Commonly known as: NEURONTIN TAKE 1 TABLET BY MOUTH 2 TIMES DAILY   hydrALAZINE 100 MG tablet Commonly known as: APRESOLINE Take 1 tablet (100 mg total) by mouth 3 (three) times daily.   insulin lispro 100 UNIT/ML KwikPen Commonly known as: HumaLOG KwikPen Inject 6-10 Units into the skin 3 (three) times daily. Sliding scale. Via [employer] patient assistance through Dec 2023   isosorbide mononitrate 60 MG 24 hr tablet Commonly known as: IMDUR TAKE 1 TABLET BY MOUTH DAILY   levothyroxine 125 MCG tablet Commonly known as: SYNTHROID TAKE 1 TABLET EVERY  DAY ON EMPTY STOMACHWITH A GLASS OF WATER AT LEAST 30-60 MINBEFORE BREAKFAST   losartan 100 MG tablet Commonly known as: COZAAR Take 1 tablet (100 mg total) by mouth daily.   Lysine 500 MG Caps Take 1 capsule by mouth daily.   OneTouch Ultra test strip Generic drug: glucose blood USE AS DIRECTED THREE TIMES A DAY   pantoprazole 40 MG tablet Commonly known as: PROTONIX TAKE ONE TABLET BY MOUTH DAILY BEFORE BREAKFAST   rosuvastatin 20 MG tablet Commonly known as: Crestor Take 1 tablet (20 mg total) by mouth daily. (TAKE IN PLACE OF ATORVASTATIN AND CANCEL ATORVASTATIN REFILLS)   senna 8.6 MG tablet Commonly known as: SENOKOT Take by mouth.   SENNA PO Take 1 capsule by mouth in the morning, at noon, in the evening, and at bedtime.   Symbicort 80-4.5 MCG/ACT inhaler Generic drug: budesonide-formoterol INHALE 2 PUFFS INTO LUNGS TWICE DAILY   terazosin 5 MG capsule Commonly known as: HYTRIN TAKE 1 CAPSULE BY MOUTH DAILY. TAKE IN PLACE OF DOXAZOSIN.   Trulicity 1.5 MG/0.5ML Sopn Generic drug: Dulaglutide Inject 1.5 mg into the skin once a week. Via [employer] patient assistance through Dec 2023   VITAMIN B 12 PO Take 1,000 mcg by mouth daily.        Signature:  Coralyn Helling, MD [facility] Pulmonary/Critical Care Pager - [phone] 08/08/2022, 3:52 PM

## 2022-08-14 DIAGNOSIS — C44519 Basal cell carcinoma of skin of other part of trunk: Secondary | ICD-10-CM | POA: Diagnosis not present

## 2022-08-15 DIAGNOSIS — N041 Nephrotic syndrome with focal and segmental glomerular lesions: Secondary | ICD-10-CM | POA: Diagnosis not present

## 2022-08-15 DIAGNOSIS — I10 Essential (primary) hypertension: Secondary | ICD-10-CM | POA: Diagnosis not present

## 2022-08-15 DIAGNOSIS — R801 Persistent proteinuria, unspecified: Secondary | ICD-10-CM | POA: Diagnosis not present

## 2022-08-15 DIAGNOSIS — E790 Hyperuricemia without signs of inflammatory arthritis and tophaceous disease: Secondary | ICD-10-CM | POA: Diagnosis not present

## 2022-08-15 DIAGNOSIS — N2581 Secondary hyperparathyroidism of renal origin: Secondary | ICD-10-CM | POA: Diagnosis not present

## 2022-08-15 DIAGNOSIS — R600 Localized edema: Secondary | ICD-10-CM | POA: Diagnosis not present

## 2022-08-15 DIAGNOSIS — E1122 Type 2 diabetes mellitus with diabetic chronic kidney disease: Secondary | ICD-10-CM | POA: Diagnosis not present

## 2022-08-17 DIAGNOSIS — E119 Type 2 diabetes mellitus without complications: Secondary | ICD-10-CM | POA: Diagnosis not present

## 2022-08-17 DIAGNOSIS — H04123 Dry eye syndrome of bilateral lacrimal glands: Secondary | ICD-10-CM | POA: Diagnosis not present

## 2022-08-17 DIAGNOSIS — H35373 Puckering of macula, bilateral: Secondary | ICD-10-CM | POA: Diagnosis not present

## 2022-08-17 DIAGNOSIS — Z961 Presence of intraocular lens: Secondary | ICD-10-CM | POA: Diagnosis not present

## 2022-08-17 LAB — HM DIABETES EYE EXAM

## 2022-08-18 ENCOUNTER — Encounter: Payer: Self-pay | Admitting: Family Medicine

## 2022-08-18 ENCOUNTER — Ambulatory Visit (INDEPENDENT_AMBULATORY_CARE_PROVIDER_SITE_OTHER): Payer: PPO | Admitting: Family Medicine

## 2022-08-18 VITALS — BP 137/62 | HR 60 | Ht 68.0 in | Wt 254.0 lb

## 2022-08-18 DIAGNOSIS — I5032 Chronic diastolic (congestive) heart failure: Secondary | ICD-10-CM | POA: Diagnosis not present

## 2022-08-18 DIAGNOSIS — N2581 Secondary hyperparathyroidism of renal origin: Secondary | ICD-10-CM | POA: Diagnosis not present

## 2022-08-18 DIAGNOSIS — N184 Chronic kidney disease, stage 4 (severe): Secondary | ICD-10-CM

## 2022-08-18 DIAGNOSIS — I1A0 Resistant hypertension: Secondary | ICD-10-CM | POA: Diagnosis not present

## 2022-08-18 DIAGNOSIS — E1121 Type 2 diabetes mellitus with diabetic nephropathy: Secondary | ICD-10-CM | POA: Diagnosis not present

## 2022-08-18 LAB — POCT GLYCOSYLATED HEMOGLOBIN (HGB A1C)
Est. average glucose Bld gHb Est-mCnc: 128
Hemoglobin A1C: 6.1 % — AB (ref 4.0–5.6)

## 2022-08-18 NOTE — Progress Notes (Signed)
Established patient visit   Patient: Jacob Herrera   DOB: 09/28/1950   72 y.o. Male  MRN: [id number] Visit Date: 08/18/2022  Today's healthcare provider: Mila Merry, MD   Chief Complaint  Patient presents with   Diabetes   Subjective    Diabetes Pertinent negatives for diabetes include no chest pain.    Here to follow up hypertension and diabetes, generally feels well, but having some dizziness when he first gets up in the morning.   Medications: Outpatient Medications Prior to Visit  Medication Sig   acetaminophen (TYLENOL) 500 MG tablet Take 500 mg by mouth every 6 (six) hours as needed.   albuterol (PROVENTIL) (2.5 MG/3ML) 0.083% nebulizer solution Take 3 mLs (2.5 mg total) by nebulization every 4 (four) hours. And PRN (Patient taking differently: Take 2.5 mg by nebulization as needed.)   amLODipine (NORVASC) 10 MG tablet Take 1 tablet (10 mg total) by mouth daily.   aspirin 81 MG tablet Take 81 mg by mouth daily.   carvedilol (COREG) 25 MG tablet TAKE 1 TABLET BY MOUTH TWICE DAILY   cetirizine (ZYRTEC) 10 MG tablet Take 10 mg by mouth daily.   cloNIDine (CATAPRES) 0.2 MG tablet TAKE 1 TABLET BY MOUTH TWICE DAILY   Continuous Blood Gluc Sensor (FREESTYLE LIBRE 3 SENSOR) MISC Place sensor on the arm every 14 days and use to monitor blood sugars continuously as directed.   Cyanocobalamin (VITAMIN B 12 PO) Take 1,000 mcg by mouth daily.    dapagliflozin propanediol ([name]) 10 MG TABS tablet Take 10 mg by mouth daily.   Dulaglutide (TRULICITY) 1.5 MG/0.5ML SOPN Inject 1.5 mg into the skin once a week. Via [employer] patient assistance through Dec 2023   erythromycin ophthalmic ointment Apply 1 a small amount into right eye every night   finasteride (PROSCAR) 5 MG tablet Take 1 tablet (5 mg total) by mouth daily.   furosemide (LASIX) 40 MG tablet Take 40 mg by mouth 2 (two) times daily. 2tab AM and 1tab PM.   gabapentin (NEURONTIN) 600 MG tablet TAKE 1 TABLET BY  MOUTH 2 TIMES DAILY   hydrALAZINE (APRESOLINE) 100 MG tablet Take 1 tablet (100 mg total) by mouth 3 (three) times daily.   Insulin Glargine (BASAGLAR KWIKPEN) 100 UNIT/ML Inject 25 Units into the skin at bedtime. Via [employer] patient assistance through Dec 2024   insulin lispro (HUMALOG KWIKPEN) 100 UNIT/ML KwikPen Inject 6-10 Units into the skin 3 (three) times daily. Sliding scale. Via [employer] patient assistance through Dec 2023   Insulin Pen Needle (BD PEN NEEDLE NANO U/F) 32G X 4 MM MISC USE DAILY AS DIRECTED   isosorbide mononitrate (IMDUR) 60 MG 24 hr tablet TAKE 1 TABLET BY MOUTH DAILY   levothyroxine (SYNTHROID) 125 MCG tablet TAKE 1 TABLET EVERY DAY ON EMPTY STOMACHWITH A GLASS OF WATER AT LEAST 30-60 MINBEFORE BREAKFAST   losartan (COZAAR) 100 MG tablet Take 1 tablet (100 mg total) by mouth daily.   Lysine 500 MG CAPS Take 1 capsule by mouth daily.   ONETOUCH ULTRA test strip USE AS DIRECTED THREE TIMES A DAY   pantoprazole (PROTONIX) 40 MG tablet TAKE ONE TABLET BY MOUTH DAILY BEFORE BREAKFAST   rosuvastatin (CRESTOR) 20 MG tablet Take 1 tablet (20 mg total) by mouth daily. (TAKE IN PLACE OF ATORVASTATIN AND CANCEL ATORVASTATIN REFILLS)   senna (SENOKOT) 8.6 MG tablet Take by mouth.   SENNA PO Take 1 capsule by mouth in the morning,  at noon, in the evening, and at bedtime.   SYMBICORT 80-4.5 MCG/ACT inhaler INHALE 2 PUFFS INTO LUNGS TWICE DAILY   terazosin (HYTRIN) 5 MG capsule TAKE 1 CAPSULE BY MOUTH DAILY. TAKE IN PLACE OF DOXAZOSIN.   VITAMIN D, CHOLECALCIFEROL, PO Take by mouth.   No facility-administered medications prior to visit.    Review of Systems  Constitutional:  Negative for appetite change, chills and fever.  Respiratory:  Negative for chest tightness, shortness of breath and wheezing.   Cardiovascular:  Negative for chest pain and palpitations.  Gastrointestinal:  Negative for abdominal pain, nausea and vomiting.       Objective    BP 137/62 (BP  Location: Left Arm, Patient Position: Sitting, Cuff Size: Large)   Pulse 60   Ht 5\' 8"  (1.727 m)   Wt 254 lb (115.2 kg)   SpO2 97%   BMI 38.62 kg/m     Results for orders placed or performed in visit on 08/18/22  POCT HgB A1C  Result Value Ref Range   Hemoglobin A1C 6.1 (A) 4.0 - 5.6 %   Est. average glucose Bld gHb Est-mCnc 128     Assessment & Plan     1. Diabetes mellitus with nephropathy (HCC) Well controlled.  Continue current medications.    2. Resistant hypertension Well controlled.  Some orthostasis in the mornings, encouraged to rise slowly  when getting up. Continue current medications.    3. Chronic diastolic heart failure (HCC) Well compensated. Continue regular follow up with cardiology.   4. Morbid obesity (HCC)   5. Secondary hyperparathyroidism of renal origin (HCC)   6. Chronic kidney disease, stage IV (severe) (HCC) Continue routine follow nephrology.   Future Appointments  Date Time Provider Department Center  08/28/2022  3:45 PM Gaspar Cola [facility] None  11/24/2022  1:30 PM Lonna Cobb, Verna Czech, MD [facility] None  12/18/2022  1:00 PM Malva Limes, MD [facility]        The entirety of the information documented in the History of Present Illness, Review of Systems and Physical Exam were personally obtained by me. Portions of this information were initially documented by the CMA and reviewed by me for thoroughness and accuracy.     Mila Merry, MD  [facility] [phone] (phone) [phone] (fax)  [facility]

## 2022-08-18 NOTE — Patient Instructions (Signed)
.   Please review the attached list of medications and notify my office if there are any errors.   . Please bring all of your medications to every appointment so we can make sure that our medication list is the same as yours.   

## 2022-08-22 DIAGNOSIS — G4733 Obstructive sleep apnea (adult) (pediatric): Secondary | ICD-10-CM | POA: Diagnosis not present

## 2022-08-23 ENCOUNTER — Telehealth: Payer: Self-pay

## 2022-08-23 NOTE — Progress Notes (Signed)
Patient states he received another shipment today of his Trulicity leaving him with 9 boxes of supply on hand.Patient is asking what is he suppose to do with it, and not sure why  [employer] sent the shipment. Patient reports it is not suppose to be on auto refill as we both reach have reach out to them to  request it to be turn off. Patient denies requesting a refill.   I reach out to [facility] to see why they sent another shipment without the patient requesting it as he should not be off of auto refill. Per [facility], patient is still on auto refill, and it has not been turn off even though she see a note stating for it to be turn off.She is unsure why it was not turn off in the past when we reach out to them.[facility] representative confirm with me while on the phone that it is now turn off and apologize for the inconvenience. Notified Clinical pharmacist.  Per Clinical pharmacist, he can store his Trulicity in his fridge, and use his oldest pens first.  Patient verbalized understanding.  Everlean Cherry Clinical Pharmacist Assistant [phone]

## 2022-08-24 ENCOUNTER — Telehealth: Payer: Self-pay | Admitting: Pulmonary Disease

## 2022-08-24 NOTE — Telephone Encounter (Signed)
I have sent urgent message to [employer] 

## 2022-08-24 NOTE — Telephone Encounter (Signed)
I have received a message back from [place] with [employer] "The cpap order is in process, I have message to have someone contact patient w/ details.  I also sent a message to the local branch regarding the ONO and a return call request to patient as well."  Thank you,

## 2022-08-24 NOTE — Telephone Encounter (Signed)
Synetta Fail can you check ok this? We do not have the ONO results either. Thank you!

## 2022-08-24 NOTE — Telephone Encounter (Signed)
Pt states his Bipap is giving him motor life exceeded messages. He has not heard from DME concerning new machine. Has done the ONO. Is also looking for those results.

## 2022-08-25 DIAGNOSIS — M1712 Unilateral primary osteoarthritis, left knee: Secondary | ICD-10-CM | POA: Diagnosis not present

## 2022-08-25 NOTE — Telephone Encounter (Signed)
I have notified the patient. He said [employer] did reach out to him yesterday.  Nothing further needed.

## 2022-08-28 ENCOUNTER — Ambulatory Visit: Payer: PPO

## 2022-08-28 DIAGNOSIS — D0461 Carcinoma in situ of skin of right upper limb, including shoulder: Secondary | ICD-10-CM | POA: Diagnosis not present

## 2022-08-28 DIAGNOSIS — C44519 Basal cell carcinoma of skin of other part of trunk: Secondary | ICD-10-CM | POA: Diagnosis not present

## 2022-08-28 DIAGNOSIS — C44619 Basal cell carcinoma of skin of left upper limb, including shoulder: Secondary | ICD-10-CM | POA: Diagnosis not present

## 2022-08-28 DIAGNOSIS — E1121 Type 2 diabetes mellitus with diabetic nephropathy: Secondary | ICD-10-CM

## 2022-08-28 DIAGNOSIS — C44612 Basal cell carcinoma of skin of right upper limb, including shoulder: Secondary | ICD-10-CM | POA: Diagnosis not present

## 2022-08-28 NOTE — Progress Notes (Signed)
Diabetic Eye Exam abstracted. Please see media 

## 2022-08-28 NOTE — Progress Notes (Unsigned)
Care Management & Coordination Services Pharmacy Note  08/28/2022 Name:  Jacob Herrera MRN:  [id number] DOB:  1950-06-01  Summary: Patient presents for follow-up consult.   -Patient has excess supply of Trulicity and Humalog due to auto-refills from [employer]. We confirmed his auto-refills were turned off and patient will use up current supply prior to requesting more.  -Patient is now administering Basaglar at night, he has noted requiring more Humalog during the day to prevent hyperglycemia. Despite this his blood sugar remains in excellent control.   Recommendations/Changes made from today's visit: Continue current medications   Follow up plan: CPP follow-up 6 month.   Subjective: Jacob Herrera is an 72 y.o. year old male who is a primary patient of [name], Demetrios Isaacs, MD.  The care coordination team was consulted for assistance with disease management and care coordination needs.    Engaged with patient by telephone for follow up visit.  Recent office visits: 08/18/22: Patient presented to Dr. Sherrie Mustache for follow-up.  04/14/22: Patient presented to Dr. Sherrie Mustache for follow-up.   Recent consult visits: 08/15/22: Patient presented to Dr. Thedore Mins (nephrology) for follow-up.  08/08/22: Patient presented to Dr. Craige Cotta (Pulmonology) for follow-up.  05/08/22: Patient presented to Dr. Thedore Mins (nephrology) for follow-up.   Hospital visits: None in previous 6 months   Objective:  Lab Results  Component Value Date   CREATININE 2.19 (H) 04/14/2022   BUN 52 (H) 04/14/2022   EGFR 31 (L) 04/14/2022   GFRNONAA 33 11/08/2020   GFRAA 42 (L) 10/07/2018   NA 137 04/14/2022   K 4.9 04/14/2022   CALCIUM 10.4 (H) 04/14/2022   CO2 22 04/14/2022   GLUCOSE 152 (H) 04/14/2022    Lab Results  Component Value Date/Time   HGBA1C 6.1 (A) 08/18/2022 04:26 PM   HGBA1C 6.1 (H) 04/14/2022 03:26 PM   HGBA1C 6.2 (H) 12/09/2021 03:24 PM   MICROALBUR 92 08/23/2021 12:00 AM   MICROALBUR neg 03/20/2017  02:25 PM    Last diabetic Eye exam:  Lab Results  Component Value Date/Time   HMDIABEYEEXA No Retinopathy 08/17/2022 12:00 AM    Last diabetic Foot exam: No results found for: "HMDIABFOOTEX"   Lab Results  Component Value Date   CHOL 131 04/14/2022   HDL 37 (L) 04/14/2022   LDLCALC 50 04/14/2022   TRIG 285 (H) 04/14/2022   CHOLHDL 3.5 04/14/2022       Latest Ref Rng & Units 04/14/2022    3:26 PM 12/09/2021    3:24 PM 11/08/2020   12:00 AM  Hepatic Function  Total Protein 6.0 - 8.5 g/dL 7.2  7.3    Albumin 3.8 - 4.8 g/dL 4.6  4.4  3.9      AST 0 - 40 IU/L 22  24    ALT 0 - 44 IU/L 22  23    Alk Phosphatase 44 - 121 IU/L 55  62    Total Bilirubin 0.0 - 1.2 mg/dL 0.2  0.3       This result is from an external source.    Lab Results  Component Value Date/Time   TSH 1.950 12/09/2021 03:24 PM   TSH 3.380 11/12/2020 02:14 PM   FREET4 1.34 12/09/2021 03:24 PM   FREET4 1.30 11/12/2020 02:14 PM       Latest Ref Rng & Units 12/09/2021    3:24 PM 11/08/2020   12:00 AM 11/09/2014   10:30 AM  CBC  WBC 3.4 - 10.8 x10E3/uL 5.9  6.1  6.6   Hemoglobin 13.0 - 17.7 g/dL 78.2  95.6     21.3   Hematocrit 37.5 - 51.0 % 34.8  32     38.9   Platelets 150 - 450 x10E3/uL 239  249     241      This result is from an external source.    Lab Results  Component Value Date/Time   VD25OH 35.8 12/09/2021 03:24 PM   VD25OH 22.8 (L) 05/26/2019 04:16 PM   VITAMINB12 1,012 12/09/2021 03:24 PM   VITAMINB12 208 (L) 05/26/2019 04:16 PM    Clinical ASCVD: No  The 10-year ASCVD risk score (Arnett DK, et al., 2019) is: 51.4%   Values used to calculate the score:     Age: 52 years     Sex: Male     Is Non-Hispanic African American: No     Diabetic: Yes     Tobacco smoker: Yes     Systolic Blood Pressure: 137 mmHg     Is BP treated: Yes     HDL Cholesterol: 37 mg/dL     Total Cholesterol: 131 mg/dL       0/11/6576    4:69 PM 05/13/2021    2:58 PM 11/12/2020    1:45 PM  Depression  screen PHQ 2/9  Decreased Interest 0 0 0  Down, Depressed, Hopeless 0 0 0  PHQ - 2 Score 0 0 0  Altered sleeping  0 1  Tired, decreased energy  1 2  Change in appetite  0 0  Feeling bad or failure about yourself   0 0  Trouble concentrating  0 0  Moving slowly or fidgety/restless  0 0  Suicidal thoughts  0 0  PHQ-9 Score  1 3  Difficult doing work/chores  Not difficult at all Not difficult at all     Social History   Tobacco Use  Smoking Status Former   Packs/day: 1.00   Years: 25.00   Additional pack years: 0.00   Total pack years: 25.00   Types: Cigarettes   Quit date: 05/29/1988   Years since quitting: 34.2  Smokeless Tobacco Current   Types: Chew  Tobacco Comments   chews 1 bag of loose leaf chew/week   BP Readings from Last 3 Encounters:  08/18/22 137/62  08/08/22 (!) 140/70  04/14/22 (!) 158/66   Pulse Readings from Last 3 Encounters:  08/18/22 60  08/08/22 65  04/14/22 62   Wt Readings from Last 3 Encounters:  08/18/22 254 lb (115.2 kg)  08/08/22 251 lb 9.6 oz (114.1 kg)  04/14/22 252 lb (114.3 kg)   BMI Readings from Last 3 Encounters:  08/18/22 38.62 kg/m  08/08/22 38.26 kg/m  04/14/22 38.32 kg/m    Allergies  Allergen Reactions   Chlorthalidone Other (See Comments)    D/c by dr. [name] for worsening renal function 03/2019   Spironolactone Other (See Comments)    gynecomastia    Medications Reviewed Today     Reviewed by Shelly Bombard, CMA (Certified Medical Assistant) on 08/18/22 at 1617  Med List Status: <None>   Medication Order Taking? Sig Documenting Provider Last Dose Status Informant  acetaminophen (TYLENOL) 500 MG tablet [id number] Yes Take 500 mg by mouth every 6 (six) hours as needed. [provider] Taking Active   albuterol (PROVENTIL) (2.5 MG/3ML) 0.083% nebulizer solution [id number] Yes Take 3 mLs (2.5 mg total) by nebulization every 4 (four) hours. And PRN  Patient taking differently: Take 2.5 mg by nebulization as  needed.   Erin Fulling, MD Taking Active            Med Note [facility], Lillia Abed S   Tue May 11, 2020  8:29 AM) PRN, hasnt had to use in 2-3 months  amLODipine (NORVASC) 10 MG tablet [id number] Yes Take 1 tablet (10 mg total) by mouth daily. Dalia Heading, MD Taking Active   aspirin 81 MG tablet [id number] Yes Take 81 mg by mouth daily. [provider] Taking Active   carvedilol (COREG) 25 MG tablet [id number] Yes TAKE 1 TABLET BY MOUTH TWICE DAILY Malva Limes, MD Taking Active   cetirizine (ZYRTEC) 10 MG tablet [id number] Yes Take 10 mg by mouth daily. [provider] Taking Active   cloNIDine (CATAPRES) 0.2 MG tablet [id number] Yes TAKE 1 TABLET BY MOUTH TWICE DAILY [name], Demetrios Isaacs, MD Taking Active   Continuous Blood Gluc Sensor (FREESTYLE LIBRE 3 SENSOR) [place] [id number] Yes Place sensor on the arm every 14 days and use to monitor blood sugars continuously as directed. Malva Limes, MD Taking Active   Cyanocobalamin (VITAMIN B 12 PO) [id number] Yes Take 1,000 mcg by mouth daily.  [provider] Taking Active Self  dapagliflozin propanediol ([name]) 10 MG TABS tablet [id number] Yes Take 10 mg by mouth daily. [provider] Taking Active   Dulaglutide (TRULICITY) 1.5 MG/0.5ML SOPN [id number] Yes Inject 1.5 mg into the skin once a week. Via [employer] patient assistance through Dec 2023 Malva Limes, MD Taking Active   erythromycin ophthalmic ointment [id number] Yes Apply 1 a small amount into right eye every night [provider] Taking Active   finasteride (PROSCAR) 5 MG tablet [id number] Yes Take 1 tablet (5 mg total) by mouth daily. Stoioff, Verna Czech, MD Taking Active   furosemide (LASIX) 40 MG tablet [id number] Yes Take 40 mg by mouth 2 (two) times daily. 2tab AM and 1tab PM. [provider] Taking Active   gabapentin (NEURONTIN) 600 MG tablet [id number] Yes TAKE 1 TABLET BY MOUTH 2 TIMES DAILY [name], Demetrios Isaacs, MD Taking Active    hydrALAZINE (APRESOLINE) 100 MG tablet [id number] Yes Take 1 tablet (100 mg total) by mouth 3 (three) times daily. Malva Limes, MD Taking Active   Insulin Glargine [facility]) 100 UNIT/ML [id number] Yes Inject 25 Units into the skin at bedtime. Via [employer] patient assistance through Dec 2024 Malva Limes, MD Taking Active   insulin lispro (HUMALOG [facility]) 100 UNIT/ML KwikPen [id number] Yes Inject 6-10 Units into the skin 3 (three) times daily. Sliding scale. Via [employer] patient assistance through Dec 2023 Malva Limes, MD Taking Active   Insulin Pen Needle (BD PEN NEEDLE NANO U/F) 32G X 4 MM MISC [id number] Yes USE DAILY AS DIRECTED Malva Limes, MD Taking Active   isosorbide mononitrate (IMDUR) 60 MG 24 hr tablet [id number] Yes TAKE 1 TABLET BY MOUTH DAILY Malva Limes, MD Taking Active   levothyroxine (SYNTHROID) 125 MCG tablet [id number] Yes TAKE 1 TABLET EVERY DAY ON EMPTY STOMACHWITH A GLASS OF WATER AT LEAST 30-60 MINBEFORE BREAKFAST Malva Limes, MD Taking Active   losartan (COZAAR) 100 MG tablet [id number] Yes Take 1 tablet (100 mg total) by mouth daily. Dalia Heading, MD Taking Active   Lysine 500 MG CAPS [id number] Yes Take 1 capsule by mouth daily. [provider] Taking Active   Koren Bound test strip [id number] Yes USE AS DIRECTED THREE TIMES A DAY Malva Limes, MD Taking Active  pantoprazole (PROTONIX) 40 MG tablet [id number] Yes TAKE ONE TABLET BY MOUTH DAILY BEFORE BREAKFAST Malva Limes, MD Taking Active   rosuvastatin (CRESTOR) 20 MG tablet [id number] Yes Take 1 tablet (20 mg total) by mouth daily. (TAKE IN PLACE OF ATORVASTATIN AND CANCEL ATORVASTATIN REFILLS) Malva Limes, MD Taking Active   senna (SENOKOT) 8.6 MG tablet [id number] Yes Take by mouth. [provider] Taking Active Self  SENNA PO [id number] Yes Take 1 capsule by mouth in the morning, at noon, in the evening, and at bedtime. [provider] Taking  Active Self  SYMBICORT 80-4.5 MCG/ACT inhaler [id number] Yes INHALE 2 PUFFS INTO LUNGS TWICE DAILY Erin Fulling, MD Taking Active Self           Med Note Lauretta Chester, Elisama Thissen A   Mon Aug 15, 2021  3:06 PM) Takes PRN  terazosin (HYTRIN) 5 MG capsule [id number] Yes TAKE 1 CAPSULE BY MOUTH DAILY. TAKE IN PLACE OF DOXAZOSIN. Malva Limes, MD Taking Active   VITAMIN D, CHOLECALCIFEROL, PO [id number] Yes Take by mouth. [provider] Taking Active   Med List Note Beverlyn Roux, RN 03/31/15 1507): UDS done 08-11-14 Meds to last until 10-09-14            SDOH:  (Social Determinants of Health) assessments and interventions performed: Yes SDOH Interventions    Flowsheet Row Chronic Care Management from 05/01/2022 in [facility] Most recent reading at 05/01/2022  4:29 PM Chronic Care Management from 05/09/2021 in [facility] Most recent reading at 05/24/2021 11:46 AM Office Visit from 05/13/2021 in [facility] Most recent reading at 05/13/2021  2:58 PM Chronic Care Management from 03/29/2021 in [facility] Most recent reading at 03/29/2021  2:26 PM Chronic Care Management from 01/04/2021 in [facility] Most recent reading at 01/04/2021  2:19 PM Office Visit from 11/12/2020 in [facility] Most recent reading at 11/12/2020  1:45 PM  SDOH Interventions        Food Insecurity Interventions Intervention Not Indicated -- -- -- -- --  Housing Interventions Intervention Not Indicated -- -- -- -- --  Transportation Interventions Intervention Not Indicated -- -- -- -- --  Utilities Interventions Intervention Not Indicated -- -- -- -- --  Alcohol Usage Interventions Intervention Not Indicated (Score <7) -- -- -- -- --  Depression Interventions/Treatment  -- -- PHQ2-9 Score <4 Follow-up Not Indicated -- -- PHQ2-9 Score <4 Follow-up Not Indicated   Financial Strain Interventions Intervention Not Indicated Intervention Not Indicated -- Other (Comment)  [PAP] Intervention Not Indicated --  Stress Interventions Intervention Not Indicated -- -- -- -- --       Medication Assistance:  Trulicity, Humalog, Basaglar obtained through [employer] medication assistance program.  Enrollment ends Dec 2024  Marcelline Deist obtained through [employer] medication assistance program.  Enrollment ends Dec 2024  Medication Access: Within the past 30 days, how often has patient missed a dose of medication? None Is a pillbox or other method used to improve adherence? Yes  Factors that may affect medication adherence? no barriers identified Are meds synced by current pharmacy? No  Are meds delivered by current pharmacy? No  Does patient experience delays in picking up medications due to transportation concerns? No   Upstream Services Reviewed: Is patient disadvantaged to use [facility]?: No  Current Rx insurance plan: HTA Name and location of Current pharmacy:  [facility] -  [place], [place] - [address] [place] [address] Phone: [phone] Fax: [phone]  [facility] Pharm[facility] services reviewed with patient today?: No  Patient requests to transfer care to [facility] Pharmacy?: No  Reason patient declined to change pharmacies: Loyalty to other pharmacy/Patient preference  Compliance/Adherence/Medication fill history: Care Gaps: Tdap  Covid  AWV  UACR   Star-Rating Drugs: Losartan 100 mg: 04/29/22 for 90-DS  Rosuvastatin 20 mg: 06/13/22 for 90-DS    Assessment/Plan  Diabetes (A1c goal <8%) -Controlled -Current medications: Trulicity 1.5 mg weekly (fridays): Appropriate, Effective, Safe, Accessible  Humalog sliding scale (4-8 units typically) -adjusts based on carb intake: Appropriate, Effective, Safe, Accessible   8 Units before breakfast most days  Basaglar 25 units daily: Appropriate, Effective, Query  Safe, Accessible   [name] 10 mg daily: Appropriate, Effective, Safe, Accessible   -Medications previously tried: Bydureon, Glipizide, Victoza, metformin, Ozempic, Actos, Januvia, Invokana -Current home glucose readings (Uses Freestyle Libre 3)   Target 7/12-7/29 10/11-10/17 11/12-11/25 4/23-5/6  Number of days worn ? 14 days  14 14 14   % of time active ? 70% 92% 89% 90% 75%  Mean Glucose (mg/dL)  696 295 284 132  GMI (2-week A1c estimate)  6.0% 6.3% 6.2% 6.2%  Glycemic Variability (%CV) ?36% 30.9% 21.8% 24.6% 21.5%  Time above >250 mg/dL <44% 0% 2% 0% 0%  Time above 70-180 mg/dL <01% 4% 02% 2% 1%  Time in range: 70-180 mg/dL >72% 53% 0% 66% 44%  Time below 70 mg/dL <0% 7% 0% 0%   -Current dietary patterns:  T[place]apia + green beans  -Current exercise: Sedentary. Patient is limited by sciatica, neuropathy pain.  -patient has excess supply of Trulicity and Humalog due to auto-refills from [employer]. We confirmed his auto-refills were turned off and patient will use up current supply prior to requesting more.  -Patient is now administering Basaglar at night, he has noted requiring more Humalog during the day to prevent hyperglycemia. Despite this his blood sugar remains in excellent control.  -Continue current medications  Heart Failure (Goal: manage symptoms and prevent exacerbations) -Controlled -Last ejection fraction: 50% -HF type: Diastolic -NYHA Class: II (slight limitation of activity) -AHA HF Stage: C (Heart disease and symptoms present) -Current treatment: Amlodipine 10 mg daily  Carvedilol 25 mg twice daily  Clonidine 0.2 mg twice daily   Furosemide 40 mg 2 AM, 1 PM.  Hydralazine 100 mg three times daily  Imdur 60 mg daily  Losartan 100 mg nightly  Terazosin 5 mg daily -Medications previously tried: Chlorthalidone, spironolactone, nebivolol   -Current home BP/HR readings: NA, does not monitor at home. -Recommended to continue current medication  Hyperlipidemia: (LDL  goal < 70) -Controlled -History of CAD s/p PCI 2016 -Current treatment: Rosuvastatin 20 mg daily: Appropriate, Effective, Query Safe, Accessible   -Current treatment: Aspirin 81 mg daily: Appropriate, Effective, Query Safe, Accessible -Medications previously tried: NA  -Educated on Importance of limiting foods high in cholesterol; -Recommended to continue current medication  Hypothyroidism (Goal: Maintain stable thyroid function) -Controlled -Current treatment  Levothyroxine 125 mcg daily before breakfast  -Medications previously tried: NA  -Recommended to continue current medication  Asthma with allergic rhinitis (Goal: control symptoms and prevent exacerbations) -Controlled -Current treatment  Symbicort 2 puffs twice daily as needed  -Medications previously tried: NA  -Exacerbations requiring treatment in last 6 months: None -Frequency of rescue inhaler use: Infrequent -Recommended to continue current medication  Chronic Kidney Disease Stage 3b-4  -All medications assessed for renal  dosing and appropriateness in chronic kidney disease. -Recommended to continue current medication  Follow Up Plan: Telephone follow up appointment with care management team member scheduled for: 02/26/2023 at 2:00 PM  Angelena Sole, PharmD, Patsy Baltimore, CPP  Clinical Pharmacist Practitioner  [facility] [phone]

## 2022-09-04 ENCOUNTER — Telehealth: Payer: Self-pay | Admitting: Pulmonary Disease

## 2022-09-04 NOTE — Telephone Encounter (Signed)
Called and spoke w/ pt he verbalized understanding. NFN att. 

## 2022-09-04 NOTE — Telephone Encounter (Signed)
ONO with Bipap 08/18/22 >> test time 9 hrs 12 min.  Basal SpO2 93.8%, low 88%.  Spent 2 sec with SpO2 at 88%.   Please let him know his oxygen test looks good.  He should continue using Bipap at night, but doesn't need supplemental oxygen at night.

## 2022-09-18 ENCOUNTER — Other Ambulatory Visit: Payer: Self-pay | Admitting: Family Medicine

## 2022-09-25 ENCOUNTER — Telehealth: Payer: Self-pay

## 2022-09-25 DIAGNOSIS — G4733 Obstructive sleep apnea (adult) (pediatric): Secondary | ICD-10-CM | POA: Diagnosis not present

## 2022-09-25 NOTE — Telephone Encounter (Signed)
We received a fax from [employer] saying the patient was set up with his new BiPAP machine today and he needs an appt between 10/26/2022 and 12/24/2022 to check his compliance.   ATC the patient, LM for the patient to return my call.

## 2022-09-25 NOTE — Telephone Encounter (Signed)
Pt. Called back made pt. Apt to come back July 31st to see NP

## 2022-09-25 NOTE — Telephone Encounter (Signed)
Appt has been scheduled. Nothing further needed.   

## 2022-10-20 ENCOUNTER — Other Ambulatory Visit: Payer: Self-pay | Admitting: Pharmacist

## 2022-10-20 ENCOUNTER — Telehealth: Payer: Self-pay

## 2022-10-20 NOTE — Progress Notes (Unsigned)
   10/20/2022  Patient ID: Jacob Herrera, male   DOB: Jun 08, 1950, 72 y.o.   MRN: [id number]  Receive message from office requesting outreach to patient who is calling regarding medications that he received through patient assistance programs. From review of chart, note patient previously received assistance from Pharmacist [name] with enrollment in patient assistance for Trulicity, Humalog and Basaglar from [place] and for patient assistance for [place] from [employer].  Reach patient by telephone today who reports that he is in need of a refill of his [place] prescription as he just now has a 30 day supply remaining. Outreach to [employer] today on behalf of patient and find patient in need of a prescription refill. Note Dr. [name] with [facility] is patient's prescriber for this program.  Outreach to [employer] by telephone today and leave a message requesting provider send a renewal of the [place] prescription to [facility] (dispensing pharmacy for [employer]).  Follow up Plan: Clinical Pharmacist will follow up with [employer]/patient again within the next 7 days.  [name], PharmD, [facility] [phone]

## 2022-10-20 NOTE — Telephone Encounter (Signed)
Copied from CRM [id number]. Topic: General - Inquiry >> Oct 19, 2022  3:11 PM Jacob Herrera wrote: Reason for CRM: Pt is trying to get in touch with Trinna Post or Bessie and the numbers that he has is disconnected.  He said it is in regards to the [employer].  Pt's assistance program.  CB#  [phone]

## 2022-10-25 ENCOUNTER — Other Ambulatory Visit: Payer: Self-pay | Admitting: Pharmacist

## 2022-10-25 DIAGNOSIS — G4733 Obstructive sleep apnea (adult) (pediatric): Secondary | ICD-10-CM | POA: Diagnosis not present

## 2022-10-25 NOTE — Patient Instructions (Addendum)
Goals Addressed             This Visit's Progress    Pharmacy Goals       It was a pleasure speaking with you today.  If you need to reach out to patient assistance programs regarding medication refills, you can do so using the following contact information:  - [employer] at [phone] regarding refills of [place]ill Mem[place] Cares at [phone] regarding refills of Trulicity, Humalog and Basaglar   Please be sure to outreach to the program when you have <4 weeks of medication remaining in order to allow time for the program to ship your refill.   Thank you!  [name], PharmD, [facility] [phone]

## 2022-10-25 NOTE — Progress Notes (Signed)
   10/25/2022  Patient ID: Signe Colt, male   DOB: 09-15-1950, 72 y.o.   MRN: [id number]  Collaborated with Asher Muir at [facility] on 7/1. Asher Muir will ask Dr. Thedore Mins to send renewal of patient's [name] prescription to [facility] (dispensing pharmacy for [employer]).  Today outreach to [employer] by telephone. Speak with [place] who confirms received new prescription and will process for patient today and ship to patient within 10-14 business days. - Provide update to patient.  Confirm patient has contact information for both [employer] and [employer] patient assistance programs as previously provided.   Remind patient to monitor his supply of medications and outreach to the program if he finds that he has <4 weeks of medication remaining in order to allow time for the program to ship refills.  Follow up Plan: Clinical Pharmacist will outreach to patient by telephone again on 02/28/2023 at 1:00 PM for medication assistance re-enrollment  [name], PharmD, [facility] [phone]

## 2022-10-25 NOTE — Patient Instructions (Signed)
Goals Addressed             This Visit's Progress    Pharmacy Goals       It was a pleasure speaking with you today.  If you need to reach out to patient assistance programs regarding medication refills, you can do so using the following contact information:  - [employer] at [phone] regarding refills of [name] - [employer] at [phone] regarding refills of Trulicity, Humalog and Basaglar   Please be sure to outreach to the program when you have <4 weeks of medication remaining in order to allow time for the program to ship your refill.   Thank you!  Kristeena Meineke Josey Forcier, PharmD, BCACP [facility] [phone]         

## 2022-11-22 ENCOUNTER — Ambulatory Visit: Payer: PPO | Admitting: Nurse Practitioner

## 2022-11-22 ENCOUNTER — Encounter: Payer: Self-pay | Admitting: Nurse Practitioner

## 2022-11-22 VITALS — BP 116/64 | HR 73 | Temp 97.8°F | Ht 68.0 in | Wt 249.2 lb

## 2022-11-22 DIAGNOSIS — Z6837 Body mass index (BMI) 37.0-37.9, adult: Secondary | ICD-10-CM | POA: Diagnosis not present

## 2022-11-22 DIAGNOSIS — E6609 Other obesity due to excess calories: Secondary | ICD-10-CM | POA: Diagnosis not present

## 2022-11-22 DIAGNOSIS — G4733 Obstructive sleep apnea (adult) (pediatric): Secondary | ICD-10-CM

## 2022-11-22 NOTE — Assessment & Plan Note (Signed)
BMI 37. Healthy weight loss encouraged.  

## 2022-11-22 NOTE — Assessment & Plan Note (Signed)
Very severe OSA on BiPAP. Excellent compliance and receives benefit from use. Residual AHI improved to 7.7/h. Average EPAP pressures 12 so will increase his minimum from 5 to 10 cmH2O. Re-evaluate in 6 weeks. Will order ONO if he continues to be controlled. Aware of proper use/care of device. Cautioned on safe driving practices.  Patient Instructions  Continue to use BiPAP every night, minimum of 4-6 hours a night.  Change equipment every 30 days or as directed by DME. Wash your tubing with warm soap and water daily, hang to dry. Wash humidifier portion weekly. Use bottled, distilled water and change daily  Be aware of reduced alertness and do not drive or operate heavy machinery if experiencing this or drowsiness.  Exercise encouraged, as tolerated. Notify if persistent daytime sleepiness occurs even with consistent use of CPAP.  Adjust BiPAP pressure to auto IPAP 22, EPAP 10, PS 6   Follow up in 6 weeks with Dr. Craige Cotta or Katie Tab Rylee,NP. Ok to do video visit. Call if you need sooner follow up

## 2022-11-22 NOTE — Progress Notes (Signed)
@Patient  ID: Jacob Herrera, male    DOB: [birth year], 72 y.o.   MRN: [id number]  Chief Complaint  Patient presents with   Follow-up    Wearing bipap nightly.     Referring provider: Malva Limes, MD  HPI: 72 year old male, former smoker followed for OSA on BiPAP.  He is a patient of Dr. Evlyn Courier and last seen in office4/16/2024.  Past medical history significant for CHF, back pain, hypertension, DM2, hypothyroid, CKD, neuropathy, scoliosis.  TEST/EVENTS:  06/05/2013 PFT: 05/06/2015 PSG: AHI 131.8, SpO2 low 61.9% 02/10/2021 BiPAP titration: Optimal settings 16/10 cmH2O  08/08/2022: OV with Dr. Craige Cotta.  Previously seen by Dr. Belia Heman.  Diagnostic study in 2017 that showed severe sleep apnea.  Eventually started on BiPAP.  Titration study in 2022 that showed BiPAP 16/10 cmH2O was adequate setting.  Current download shows that he is using auto BiPAP.  Has trouble with dry mouth and bloating.  Using a fullface mask.  Unable to tolerate nasal masks.  He was supposed to have his BiPAP changed to 20 over 10 cm of water but his current download shows his settings are max IPAP 25, min EPAP 12 and pressure support of 10.  He was supposed to be on oxygen at night with his BiPAP.  Has not been using this.  Compliant with BiPAP and reports benefit from therapy.  Current BiPAP is more than 74 years old.  Residual AHI remains elevated.  Will arrange for new ResMed auto BiPAP with IPAP max 22, EPAP min 5, pressure support 6.  Will arrange for overnight oximetry with new auto BiPAP and then determine if he should consider restarting 2 L supplemental O2.  11/22/2022: Today-follow-up Patient resents today for follow-up after receiving new BiPAP machine.  Feels like it is working relatively well for him.  Not having any more difficulties with bloating or dry mouth.  He does still feel tired some days but attributes this to some restless sleep at night.  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  Does feel like he receives benefit from using his BiPAP.  Not sure that he would be able to sleep without it.  Not noticing any significant leaks.  Wearing full facemask.  No morning headaches or drowsy driving.  No sleep parasomnia/paralysis.  Still not wearing oxygen at night with his BiPAP.  10/21/2022-11/19/2022: BiPAP auto IPAP max 22, EPAP min 5, PS 6 30/30 days; 100% > 4 h; average use 8 hr 14 min Pressure 95th IPAP 18.3, EPAP 12.3 Leaks 95th 1.1 AHI 7.7  Allergies  Allergen Reactions   Chlorthalidone Other (See Comments)    D/c by dr. [name] for worsening renal function 03/2019   Spironolactone Other (See Comments)    gynecomastia    Immunization History  Administered Date(s) Administered   Fluad Quad(high Dose 65+) 01/21/2019   Influenza Split 02/26/2013, 01/12/2014   Influenza, High Dose Seasonal PF 02/09/2015, 03/07/2016, 03/20/2017, 02/13/2018, 01/30/2020   PFIZER Comirnaty(Gray Top)Covid-19 Tri-Sucrose Vaccine 06/17/2019, 07/08/2019   PFIZER(Purple Top)SARS-COV-2 Vaccination 06/17/2019, 07/08/2019, 01/30/2020   Pneumococcal Conjugate-13 01/16/2014   Pneumococcal Polysaccharide-23 07/07/2008, 03/07/2016   Respiratory Syncytial Virus Vaccine,Recomb Aduvanted(Arexvy) 01/17/2022   Td 01/05/2003   Tdap 07/07/2008   Zoster Recombinant(Shingrix) 09/28/2017, 12/21/2017    Past Medical History:  Diagnosis Date   Asthma    CHF (congestive heart failure) (HCC)    Chronic lower back pain    Complication  of anesthesia    unknow "difficulty" after cardiac cath   Diabetes mellitus, type 2 (HCC)    History of chicken pox    Hx of skin cancer, basal cell    Hypertension    Hypothyroidism    Kidney disease    stage 3   Neuropathy    feet   Scoliosis    Sleep apnea    BiPAP   Thyrotoxicosis    2010    Tobacco History: Social History   Tobacco Use  Smoking Status Former   Current packs/day: 0.00   Average packs/day: 1  pack/day for 25.0 years (25.0 ttl pk-yrs)   Types: Cigarettes   Start date: 05/30/1963   Quit date: 05/29/1988   Years since quitting: 34.5  Smokeless Tobacco Current   Types: Chew  Tobacco Comments   chews 1 bag of loose leaf chew/week   Ready to quit: Not Answered Counseling given: Not Answered Tobacco comments: chews 1 bag of loose leaf chew/week   Outpatient Medications Prior to Visit  Medication Sig Dispense Refill   acetaminophen (TYLENOL) 500 MG tablet Take 500 mg by mouth every 6 (six) hours as needed.     albuterol (PROVENTIL) (2.5 MG/3ML) 0.083% nebulizer solution Take 3 mLs (2.5 mg total) by nebulization every 4 (four) hours. And PRN (Patient taking differently: Take 2.5 mg by nebulization as needed.) 75 mL 12   amLODipine (NORVASC) 10 MG tablet Take 1 tablet (10 mg total) by mouth daily. 30 tablet 1   aspirin 81 MG tablet Take 81 mg by mouth daily.     carvedilol (COREG) 25 MG tablet TAKE 1 TABLET BY MOUTH TWICE DAILY 180 tablet 4   cetirizine (ZYRTEC) 10 MG tablet Take 10 mg by mouth daily.     cloNIDine (CATAPRES) 0.2 MG tablet TAKE 1 TABLET BY MOUTH TWICE DAILY 180 tablet 3   Continuous Blood Gluc Sensor (FREESTYLE LIBRE 3 SENSOR) MISC Place sensor on the arm every 14 days and use to monitor blood sugars continuously as directed. 6 each 3   Cyanocobalamin (VITAMIN B 12 PO) Take 1,000 mcg by mouth daily.      dapagliflozin propanediol ([name]) 10 MG TABS tablet Take 10 mg by mouth daily.     Dulaglutide (TRULICITY) 1.5 MG/0.5ML SOPN Inject 1.5 mg into the skin once a week. Via [employer] patient assistance through Dec 2023     erythromycin ophthalmic ointment Apply 1 a small amount into right eye every night     finasteride (PROSCAR) 5 MG tablet Take 1 tablet (5 mg total) by mouth daily. 30 tablet 11   furosemide (LASIX) 40 MG tablet Take 40 mg by mouth 2 (two) times daily. 2tab AM and 1tab PM.     gabapentin (NEURONTIN) 600 MG tablet TAKE 1 TABLET BY MOUTH 2 TIMES DAILY  180 tablet 4   hydrALAZINE (APRESOLINE) 100 MG tablet Take 1 tablet (100 mg total) by mouth 3 (three) times daily. 270 tablet 4   Insulin Glargine (BASAGLAR KWIKPEN) 100 UNIT/ML Inject 25 Units into the skin at bedtime. Via [employer] patient assistance through Dec 2024     insulin lispro (HUMALOG KWIKPEN) 100 UNIT/ML KwikPen Inject 6-10 Units into the skin 3 (three) times daily. Sliding scale. Via [employer] patient assistance through Dec 2023     Insulin Pen Needle (BD PEN NEEDLE NANO U/F) 32G X 4 MM MISC USE DAILY AS DIRECTED 100 each 3   isosorbide mononitrate (IMDUR) 60 MG 24 hr tablet TAKE  1 TABLET BY MOUTH DAILY 90 tablet 4   levothyroxine (SYNTHROID) 125 MCG tablet TAKE 1 TABLET EVERY DAY ON EMPTY STOMACHWITH A GLASS OF WATER AT LEAST 30-60 MINBEFORE BREAKFAST 90 tablet 4   losartan (COZAAR) 100 MG tablet Take 1 tablet (100 mg total) by mouth daily. 30 tablet 1   Lysine 500 MG CAPS Take 1 capsule by mouth daily.     ONETOUCH ULTRA test strip USE AS DIRECTED THREE TIMES A DAY 100 each 3   pantoprazole (PROTONIX) 40 MG tablet TAKE ONE TABLET BY MOUTH DAILY BEFORE BREAKFAST 30 tablet 0   rosuvastatin (CRESTOR) 20 MG tablet Take 1 tablet (20 mg total) by mouth daily. (TAKE IN PLACE OF ATORVASTATIN AND CANCEL ATORVASTATIN REFILLS) 90 tablet 3   senna (SENOKOT) 8.6 MG tablet Take by mouth.     SENNA PO Take 1 capsule by mouth in the morning, at noon, in the evening, and at bedtime.     SYMBICORT 80-4.5 MCG/ACT inhaler INHALE 2 PUFFS INTO LUNGS TWICE DAILY 10.2 g 0   terazosin (HYTRIN) 5 MG capsule TAKE 1 CAPSULE BY MOUTH DAILY. TAKE IN PLACE OF DOXAZOSIN. 90 capsule 4   VITAMIN D, CHOLECALCIFEROL, PO Take by mouth.     No facility-administered medications prior to visit.     Review of Systems:   Constitutional: No weight loss or gain, night sweats, fevers, chills, or lassitude. +fatigue (baseline) HEENT: No headaches, difficulty swallowing, tooth/dental problems, or sore throat. No  sneezing, itching, ear ache, nasal congestion, or post nasal drip CV:  No chest pain, orthopnea, PND, swelling in lower extremities, anasarca, dizziness, palpitations, syncope Resp: No shortness of breath with exertion or at rest. No excess mucus or change in color of mucus. No productive or non-productive. No hemoptysis. No wheezing.  No chest wall deformity GI:  No heartburn, indigestion Skin: No rash, lesions, ulcerations MSK:  +chronic hip and back pain Neuro: No dizziness or lightheadedness.  Psych: No depression or anxiety. Mood stable.     Physical Exam:  BP 116/64 (BP Location: Left Arm, Cuff Size: Large)   Pulse 73   Temp 97.8 F (36.6 C) (Temporal)   Ht 5\' 8"  (1.727 m)   Wt 249 lb 3.2 oz (113 kg)   SpO2 94%   BMI 37.89 kg/m   GEN: Pleasant, interactive, well-kempt; obese; in no acute distress. HEENT:  Normocephalic and atraumatic. PERRLA. Sclera white. Nasal turbinates pink, moist and patent bilaterally. No rhinorrhea present. Oropharynx pink and moist, without exudate or edema. No lesions, ulcerations, or postnasal drip. Mallampati II NECK:  Supple w/ fair ROM. No JVD present. Normal carotid impulses w/o bruits. Thyroid symmetrical with no goiter or nodules palpated. No lymphadenopathy.   CV: RRR, no m/r/g, no peripheral edema. Pulses intact, +2 bilaterally. No cyanosis, pallor or clubbing. PULMONARY:  Unlabored, regular breathing. Clear bilaterally A&P w/o wheezes/rales/rhonchi. No accessory muscle use.  GI: BS present and normoactive. Soft, non-tender to palpation. No organomegaly or masses detected.  MSK: No erythema, warmth or tenderness. Cap refil <2 sec all extrem. No deformities or joint swelling noted.  Neuro: A/Ox3. No focal deficits noted.   Skin: Warm, no lesions or rashe Psych: Normal affect and behavior. Judgement and thought content appropriate.     Lab Results:  CBC    Component Value Date/Time   WBC 5.9 12/09/2021 1524   WBC 8.0 07/08/2013 0555    RBC 3.73 (L) 12/09/2021 1524   RBC 3.57 (A) 11/08/2020 0000   HGB 11.2 (  L) 12/09/2021 1524   HCT 34.8 (L) 12/09/2021 1524   PLT 239 12/09/2021 1524   MCV 93 12/09/2021 1524   MCV 93 07/08/2013 0555   MCH 30.0 12/09/2021 1524   MCH 30.0 07/08/2013 0555   MCHC 32.2 12/09/2021 1524   MCHC 32.4 07/08/2013 0555   RDW 12.7 12/09/2021 1524   RDW 14.2 07/08/2013 0555   LYMPHSABS 2.5 07/08/2013 0555   MONOABS 0.4 07/08/2013 0555   EOSABS 0.2 07/08/2013 0555   BASOSABS 0.1 07/08/2013 0555    BMET    Component Value Date/Time   NA 137 04/14/2022 1526   NA 136 07/08/2013 0555   K 4.9 04/14/2022 1526   K 4.0 07/08/2013 0555   CL 101 04/14/2022 1526   CL 102 07/08/2013 0555   CO2 22 04/14/2022 1526   CO2 31 07/08/2013 0555   GLUCOSE 152 (H) 04/14/2022 1526   GLUCOSE 152 (H) 04/01/2015 0906   GLUCOSE 175 (H) 07/08/2013 0555   BUN 52 (H) 04/14/2022 1526   BUN 20 (H) 07/08/2013 0555   CREATININE 2.19 (H) 04/14/2022 1526   CREATININE 1.36 (H) 07/08/2013 0555   CALCIUM 10.4 (H) 04/14/2022 1526   CALCIUM 9.3 07/08/2013 0555   GFRNONAA 33 11/08/2020 0000   GFRNONAA 54 (L) 07/08/2013 0555   GFRAA 42 (L) 10/07/2018 0825   GFRAA >60 07/08/2013 0555    BNP    Component Value Date/Time   BNP 154.9 (H) 11/09/2014 1030     Imaging:  No results found.  Administration History     None           No data to display          No results found for: "NITRICOXIDE"      Assessment & Plan:   OSA treated with BiPAP Very severe OSA on BiPAP. Excellent compliance and receives benefit from use. Residual AHI improved to 7.7/h. Average EPAP pressures 12 so will increase his minimum from 5 to 10 cmH2O. Re-evaluate in 6 weeks. Will order ONO if he continues to be controlled. Aware of proper use/care of device. Cautioned on safe driving practices.  Patient Instructions  Continue to use BiPAP every night, minimum of 4-6 hours a night.  Change equipment every 30 days or as directed  by DME. Wash your tubing with warm soap and water daily, hang to dry. Wash humidifier portion weekly. Use bottled, distilled water and change daily  Be aware of reduced alertness and do not drive or operate heavy machinery if experiencing this or drowsiness.  Exercise encouraged, as tolerated. Notify if persistent daytime sleepiness occurs even with consistent use of CPAP.  Adjust BiPAP pressure to auto IPAP 22, EPAP 10, PS 6   Follow up in 6 weeks with Dr. Craige Cotta or Katie Monnie Gudgel,NP. Ok to do video visit. Call if you need sooner follow up    Class 2 obesity with body mass index (BMI) of 37.0 to 37.9 in adult BMI 37. Healthy weight loss encouraged  I spent 35 minutes of dedicated to the care of this patient on the date of this encounter to include pre-visit review of records, face-to-face time with the patient discussing conditions above, post visit ordering of testing, clinical documentation with the electronic health record, making appropriate referrals as documented, and communicating necessary findings to members of the patients care team.  Noemi Chapel, NP 11/22/2022  Pt aware and understands NP's role.

## 2022-11-22 NOTE — Patient Instructions (Signed)
Continue to use BiPAP every night, minimum of 4-6 hours a night.  Change equipment every 30 days or as directed by DME. Wash your tubing with warm soap and water daily, hang to dry. Wash humidifier portion weekly. Use bottled, distilled water and change daily  Be aware of reduced alertness and do not drive or operate heavy machinery if experiencing this or drowsiness.  Exercise encouraged, as tolerated. Notify if persistent daytime sleepiness occurs even with consistent use of CPAP.  Adjust BiPAP pressure to auto IPAP 22, EPAP 10, PS 6   Follow up in 6 weeks with Dr. Craige Cotta or Katie Kortlynn Poust,NP. Ok to do video visit. Call if you need sooner follow up

## 2022-11-23 ENCOUNTER — Ambulatory Visit: Payer: PPO | Admitting: Urology

## 2022-11-23 ENCOUNTER — Encounter: Payer: Self-pay | Admitting: Urology

## 2022-11-23 VITALS — BP 154/70 | HR 68 | Ht 68.0 in | Wt 249.0 lb

## 2022-11-23 DIAGNOSIS — N401 Enlarged prostate with lower urinary tract symptoms: Secondary | ICD-10-CM | POA: Diagnosis not present

## 2022-11-23 LAB — BLADDER SCAN AMB NON-IMAGING: PVR: 22 WU

## 2022-11-23 NOTE — Progress Notes (Signed)
I,Dina M Abdulla,acting as a scribe for Riki Altes, MD.,have documented all relevant documentation on the behalf of Riki Altes, MD,as directed by  Riki Altes, MD while in the presence of Riki Altes, MD.   11/23/2022 2:18 PM   Jacob Herrera 1950/10/19 [id number]  Referring provider: Malva Limes, MD [address] [place],  [place] [address]   Urologic history:  1.  BPH with lower urinary tract symptoms -Combination therapy terazosin 5 mg/finasteride   HPI: 72 y.o. male presents for annual follow-up.  Since last year's visit, he does note decreased force and caliber of his urinary stream in the morning, which has been bothersome By mid morning, he has stable low urinary tract symptoms Denies dysuria, gross hematuria No flank, abdominal or pelvic pain   PMH: Past Medical History:  Diagnosis Date   Asthma    CHF (congestive heart failure) (HCC)    Chronic lower back pain    Complication of anesthesia    unknow "difficulty" after cardiac cath   Diabetes mellitus, type 2 (HCC)    History of chicken pox    Hx of skin cancer, basal cell    Hypertension    Hypothyroidism    Kidney disease    stage 3   Neuropathy    feet   Scoliosis    Sleep apnea    BiPAP   Thyrotoxicosis    2010    Surgical History: Past Surgical History:  Procedure Laterality Date   CARDIAC CATHETERIZATION N/A 03/31/2015   Procedure: Left Heart Cath and Coronary Angiography;  Surgeon: Dalia Heading, MD;  Location: [facility] INVASIVE CV LAB;  Service: Cardiovascular;  Laterality: N/A;   CARDIAC CATHETERIZATION N/A 03/31/2015   Procedure: Coronary Stent Intervention;  Surgeon: Dalia Heading, MD;  Location: [facility] INVASIVE CV LAB;  Service: Cardiovascular;  Laterality: N/A;   CARDIAC CATHETERIZATION N/A 03/31/2015   Procedure: Coronary Stent Intervention;  Surgeon: Alwyn Pea, MD;  Location: [facility] INVASIVE CV LAB;  Service: Cardiovascular;  Laterality: N/A;    CATARACT EXTRACTION W/PHACO Right 05/11/2020   Procedure: CATARACT EXTRACTION PHACO AND INTRAOCULAR LENS PLACEMENT (IOC) RIGHT DIABETIC 6.52 01:11.6 9.1%;  Surgeon: Lockie Mola, MD;  [facility]: [facility];  Service: Ophthalmology;  Laterality: Right;  Diabetic - insulin sleep apnea   CATARACT EXTRACTION W/PHACO Left 06/02/2020   Procedure: CATARACT EXTRACTION PHACO AND INTRAOCULAR LENS PLACEMENT (IOC) LEFT DIABETIC;  Surgeon: Lockie Mola, MD;  Location: [facility];  Service: Ophthalmology;  Laterality: Left;  3.14 0:45.0 7.0%   COLONOSCOPY WITH PROPOFOL N/A 04/04/2021   Procedure: COLONOSCOPY WITH PROPOFOL;  Surgeon: Jaynie Collins, DO;  Location: [facility] ENDOSCOPY;  Service: Gastroenterology;  Laterality: N/A;   ESOPHAGOGASTRODUODENOSCOPY (EGD) WITH PROPOFOL N/A 04/04/2021   Procedure: ESOPHAGOGASTRODUODENOSCOPY (EGD) WITH PROPOFOL;  Surgeon: Jaynie Collins, DO;  Location: [facility] ENDOSCOPY;  Service: Gastroenterology;  Laterality: N/A;  IDDM   MOHS SURGERY Left 05/12/2019   Left nares by Glennis Brink, MD [facility]   NASAL SINUS SURGERY  2005   TONSILLECTOMY AND ADENOIDECTOMY  1950    Home Medications:  Allergies as of 11/23/2022       Reactions   Chlorthalidone Other (See Comments)   D/c by dr. [name] for worsening renal function 03/2019   Spironolactone Other (See Comments)   gynecomastia        Medication List        Accurate as of November 23, 2022  2:18 PM. If  you have any questions, ask your nurse or doctor.          acetaminophen 500 MG tablet Commonly known as: TYLENOL Take 500 mg by mouth every 6 (six) hours as needed.   albuterol (2.5 MG/3ML) 0.083% nebulizer solution Commonly known as: PROVENTIL Take 3 mLs (2.5 mg total) by nebulization every 4 (four) hours. And PRN What changed:  when to take this reasons to take this additional instructions   amLODipine 10 MG tablet Commonly known as: NORVASC Take 1  tablet (10 mg total) by mouth daily.   aspirin 81 MG tablet Take 81 mg by mouth daily.   Basaglar KwikPen 100 UNIT/ML Inject 25 Units into the skin at bedtime. Via [employer] patient assistance through Dec 2024   BD Pen Needle Nano U/F 32G X 4 MM Misc Generic drug: Insulin Pen Needle USE DAILY AS DIRECTED   carvedilol 25 MG tablet Commonly known as: COREG TAKE 1 TABLET BY MOUTH TWICE DAILY   cetirizine 10 MG tablet Commonly known as: ZYRTEC Take 10 mg by mouth daily.   cloNIDine 0.2 MG tablet Commonly known as: CATAPRES TAKE 1 TABLET BY MOUTH TWICE DAILY   dapagliflozin propanediol 10 MG Tabs tablet Commonly known as: [name] Take 10 mg by mouth daily.   erythromycin ophthalmic ointment Apply 1 a small amount into right eye every night   finasteride 5 MG tablet Commonly known as: PROSCAR Take 1 tablet (5 mg total) by mouth daily.   FreeStyle Libre 3 Sensor Misc Place sensor on the arm every 14 days and use to monitor blood sugars continuously as directed.   furosemide 40 MG tablet Commonly known as: LASIX Take 40 mg by mouth 2 (two) times daily. 2tab AM and 1tab PM.   gabapentin 600 MG tablet Commonly known as: NEURONTIN TAKE 1 TABLET BY MOUTH 2 TIMES DAILY   hydrALAZINE 100 MG tablet Commonly known as: APRESOLINE Take 1 tablet (100 mg total) by mouth 3 (three) times daily.   insulin lispro 100 UNIT/ML KwikPen Commonly known as: HumaLOG KwikPen Inject 6-10 Units into the skin 3 (three) times daily. Sliding scale. Via [employer] patient assistance through Dec 2023   isosorbide mononitrate 60 MG 24 hr tablet Commonly known as: IMDUR TAKE 1 TABLET BY MOUTH DAILY   levothyroxine 125 MCG tablet Commonly known as: SYNTHROID TAKE 1 TABLET EVERY DAY ON EMPTY STOMACHWITH A GLASS OF WATER AT LEAST 30-60 MINBEFORE BREAKFAST   losartan 100 MG tablet Commonly known as: COZAAR Take 1 tablet (100 mg total) by mouth daily.   Lysine 500 MG Caps Take 1 capsule by  mouth daily.   OneTouch Ultra test strip Generic drug: glucose blood USE AS DIRECTED THREE TIMES A DAY   pantoprazole 40 MG tablet Commonly known as: PROTONIX TAKE ONE TABLET BY MOUTH DAILY BEFORE BREAKFAST   rosuvastatin 20 MG tablet Commonly known as: Crestor Take 1 tablet (20 mg total) by mouth daily. (TAKE IN PLACE OF ATORVASTATIN AND CANCEL ATORVASTATIN REFILLS)   senna 8.6 MG tablet Commonly known as: SENOKOT Take by mouth.   SENNA PO Take 1 capsule by mouth in the morning, at noon, in the evening, and at bedtime.   Symbicort 80-4.5 MCG/ACT inhaler Generic drug: budesonide-formoterol INHALE 2 PUFFS INTO LUNGS TWICE DAILY   terazosin 5 MG capsule Commonly known as: HYTRIN TAKE 1 CAPSULE BY MOUTH DAILY. TAKE IN PLACE OF DOXAZOSIN.   Trulicity 1.5 MG/0.5ML Sopn Generic drug: Dulaglutide Inject 1.5 mg into the skin once  a week. Via [employer] patient assistance through Dec 2023   VITAMIN B 12 PO Take 1,000 mcg by mouth daily.   VITAMIN D (CHOLECALCIFEROL) PO Take by mouth.        Allergies:  Allergies  Allergen Reactions   Chlorthalidone Other (See Comments)    D/c by dr. [name] for worsening renal function 03/2019   Spironolactone Other (See Comments)    gynecomastia    Family History: Family History  Problem Relation Age of Onset   Emphysema Father    Cancer - Lung Father    Cancer - Ovarian Mother        16's   Breast cancer Mother        late 33's    Social History:  reports that he quit smoking about 34 years ago. His smoking use included cigarettes. He started smoking about 59 years ago. He has a 25 pack-year smoking history. His smokeless tobacco use includes chew. He reports that he does not drink alcohol and does not use drugs.   Physical Exam: BP (!) 154/70   Pulse 68   Ht 5\' 8"  (1.727 m)   Wt 249 lb (112.9 kg)   BMI 37.86 kg/m   Constitutional:  Alert, No acute distress. HEENT: [place] AT Respiratory: Normal respiratory effort, no  increased work of breathing. Psychiatric: Normal mood and affect.   Assessment & Plan:    1. Benign prostatic hyperplasia with LUTS We discussed his AM symptoms are most likely due to overstretch of the bladder. He takes his Terazosin in the morning and we'll have him take it at bedtime to see if this improves his daytime symptoms. He will try this for 30 days and if no improvement, he will call back and will add Silodosin 8 mg daily. Continue annual follow up.  I have reviewed the above documentation for accuracy and completeness, and I agree with the above.   Riki Altes, MD  [facility] [address] [place], [place] [address] [phone]

## 2022-11-24 ENCOUNTER — Ambulatory Visit: Payer: PPO | Admitting: Urology

## 2022-11-25 ENCOUNTER — Encounter: Payer: Self-pay | Admitting: Urology

## 2022-11-25 DIAGNOSIS — G4733 Obstructive sleep apnea (adult) (pediatric): Secondary | ICD-10-CM | POA: Diagnosis not present

## 2022-12-01 ENCOUNTER — Other Ambulatory Visit: Payer: Self-pay | Admitting: Urology

## 2022-12-01 DIAGNOSIS — N401 Enlarged prostate with lower urinary tract symptoms: Secondary | ICD-10-CM

## 2022-12-01 NOTE — Progress Notes (Signed)
Reviewed and agree with assessment/plan.   Coralyn Helling, MD [facility] Pulmonary/Critical Care 12/01/2022, 3:45 PM Pager:  [phone]

## 2022-12-06 ENCOUNTER — Other Ambulatory Visit: Payer: Self-pay | Admitting: Family Medicine

## 2022-12-06 DIAGNOSIS — I1A0 Resistant hypertension: Secondary | ICD-10-CM

## 2022-12-07 NOTE — Telephone Encounter (Signed)
Requested Prescriptions  Pending Prescriptions Disp Refills   cloNIDine (CATAPRES) 0.2 MG tablet [Pharmacy Med Name: CLONIDINE HCL 0.2 MG TAB] 180 tablet 0    Sig: TAKE 1 TABLET BY MOUTH TWICE DAILY     Cardiovascular:  Alpha-2 Agonists Failed - 12/06/2022 11:29 AM      Failed - Last BP in normal range    BP Readings from Last 1 Encounters:  11/23/22 (!) 154/70         Passed - Last Heart Rate in normal range    Pulse Readings from Last 1 Encounters:  11/23/22 68         Passed - Valid encounter within last 6 months    Recent Outpatient Visits           3 months ago Diabetes mellitus with nephropathy (HCC)   [facility] Malva Limes, MD   7 months ago Hyperlipidemia, unspecified hyperlipidemia type   [facility] Malva Limes, MD   12 months ago Prostate cancer screening   [facility] Malva Limes, MD   1 year ago Resistant hypertension   [facility] Malva Limes, MD   1 year ago Annual physical exam   [facility] Malva Limes, MD       Future Appointments             In 1 week [name], Demetrios Isaacs, MD [facility]   In 4 weeks Coralyn Helling, MD [facility] [place]   In 11 months Stoioff, Verna Czech, MD [facility] [place]

## 2022-12-11 DIAGNOSIS — N041 Nephrotic syndrome with focal and segmental glomerular lesions: Secondary | ICD-10-CM | POA: Diagnosis not present

## 2022-12-11 DIAGNOSIS — R600 Localized edema: Secondary | ICD-10-CM | POA: Diagnosis not present

## 2022-12-11 DIAGNOSIS — N2581 Secondary hyperparathyroidism of renal origin: Secondary | ICD-10-CM | POA: Diagnosis not present

## 2022-12-11 DIAGNOSIS — R801 Persistent proteinuria, unspecified: Secondary | ICD-10-CM | POA: Diagnosis not present

## 2022-12-11 DIAGNOSIS — E1122 Type 2 diabetes mellitus with diabetic chronic kidney disease: Secondary | ICD-10-CM | POA: Diagnosis not present

## 2022-12-11 DIAGNOSIS — I10 Essential (primary) hypertension: Secondary | ICD-10-CM | POA: Diagnosis not present

## 2022-12-11 DIAGNOSIS — E790 Hyperuricemia without signs of inflammatory arthritis and tophaceous disease: Secondary | ICD-10-CM | POA: Diagnosis not present

## 2022-12-11 LAB — COMPREHENSIVE METABOLIC PANEL: eGFR: 33

## 2022-12-12 ENCOUNTER — Other Ambulatory Visit: Payer: Self-pay | Admitting: Family Medicine

## 2022-12-12 DIAGNOSIS — E1121 Type 2 diabetes mellitus with diabetic nephropathy: Secondary | ICD-10-CM

## 2022-12-12 LAB — PROTEIN / CREATININE RATIO, URINE
Creatinine Random, Urine: 58
Protein Creatinine Ratio: 1052
Protein, Urine: 61

## 2022-12-18 ENCOUNTER — Ambulatory Visit (INDEPENDENT_AMBULATORY_CARE_PROVIDER_SITE_OTHER): Payer: PPO | Admitting: Family Medicine

## 2022-12-18 ENCOUNTER — Encounter: Payer: Self-pay | Admitting: Family Medicine

## 2022-12-18 VITALS — BP 127/61 | HR 62 | Ht 68.0 in | Wt 249.5 lb

## 2022-12-18 DIAGNOSIS — R5383 Other fatigue: Secondary | ICD-10-CM

## 2022-12-18 DIAGNOSIS — N401 Enlarged prostate with lower urinary tract symptoms: Secondary | ICD-10-CM

## 2022-12-18 DIAGNOSIS — Z23 Encounter for immunization: Secondary | ICD-10-CM | POA: Diagnosis not present

## 2022-12-18 DIAGNOSIS — N2581 Secondary hyperparathyroidism of renal origin: Secondary | ICD-10-CM | POA: Diagnosis not present

## 2022-12-18 DIAGNOSIS — N1832 Chronic kidney disease, stage 3b: Secondary | ICD-10-CM | POA: Diagnosis not present

## 2022-12-18 DIAGNOSIS — E1122 Type 2 diabetes mellitus with diabetic chronic kidney disease: Secondary | ICD-10-CM | POA: Diagnosis not present

## 2022-12-18 DIAGNOSIS — R0609 Other forms of dyspnea: Secondary | ICD-10-CM

## 2022-12-18 DIAGNOSIS — Z7984 Long term (current) use of oral hypoglycemic drugs: Secondary | ICD-10-CM | POA: Diagnosis not present

## 2022-12-18 DIAGNOSIS — R011 Cardiac murmur, unspecified: Secondary | ICD-10-CM | POA: Diagnosis not present

## 2022-12-18 DIAGNOSIS — Z794 Long term (current) use of insulin: Secondary | ICD-10-CM

## 2022-12-18 DIAGNOSIS — D631 Anemia in chronic kidney disease: Secondary | ICD-10-CM | POA: Diagnosis not present

## 2022-12-18 DIAGNOSIS — I1A0 Resistant hypertension: Secondary | ICD-10-CM

## 2022-12-18 DIAGNOSIS — R809 Proteinuria, unspecified: Secondary | ICD-10-CM | POA: Diagnosis not present

## 2022-12-18 DIAGNOSIS — E790 Hyperuricemia without signs of inflammatory arthritis and tophaceous disease: Secondary | ICD-10-CM | POA: Diagnosis not present

## 2022-12-18 DIAGNOSIS — E1121 Type 2 diabetes mellitus with diabetic nephropathy: Secondary | ICD-10-CM

## 2022-12-18 LAB — POCT GLYCOSYLATED HEMOGLOBIN (HGB A1C): Hemoglobin A1C: 6.3 % — AB (ref 4.0–5.6)

## 2022-12-19 ENCOUNTER — Ambulatory Visit: Payer: PPO | Admitting: Podiatry

## 2022-12-19 DIAGNOSIS — L600 Ingrowing nail: Secondary | ICD-10-CM

## 2022-12-19 NOTE — Progress Notes (Signed)
Subjective:  Patient ID: Jacob Herrera, male    DOB: 30-Oct-1950,  MRN: [id number]  Chief Complaint  Patient presents with   Ingrown Toenail    Left hallux     72 y.o. male presents with the above complaint.  Patient presents with left hallux lateral border ingrown painful to touch is progressive gotten worse worse with ambulation worse with pressure would like to have removed he is a diabetic but with controlled A1c of 6.3.  Denies any other acute issues.  Denies any other acute complaints   Review of Systems: Negative except as noted in the HPI. Denies N/V/F/Ch.  Past Medical History:  Diagnosis Date   Asthma    CHF (congestive heart failure) (HCC)    Chronic lower back pain    Complication of anesthesia    unknow "difficulty" after cardiac cath   Diabetes mellitus, type 2 (HCC)    History of chicken pox    Hx of skin cancer, basal cell    Hypertension    Hypothyroidism    Kidney disease    stage 3   Neuropathy    feet   Scoliosis    Sleep apnea    BiPAP   Thyrotoxicosis    2010    Current Outpatient Medications:    acetaminophen (TYLENOL) 500 MG tablet, Take 500 mg by mouth every 6 (six) hours as needed., Disp: , Rfl:    albuterol (PROVENTIL) (2.5 MG/3ML) 0.083% nebulizer solution, Take 3 mLs (2.5 mg total) by nebulization every 4 (four) hours. And PRN (Patient taking differently: Take 2.5 mg by nebulization as needed.), Disp: 75 mL, Rfl: 12   amLODipine (NORVASC) 10 MG tablet, Take 1 tablet (10 mg total) by mouth daily., Disp: 30 tablet, Rfl: 1   aspirin 81 MG tablet, Take 81 mg by mouth daily., Disp: , Rfl:    carvedilol (COREG) 25 MG tablet, TAKE 1 TABLET BY MOUTH TWICE DAILY, Disp: 180 tablet, Rfl: 4   cetirizine (ZYRTEC) 10 MG tablet, Take 10 mg by mouth daily., Disp: , Rfl:    cloNIDine (CATAPRES) 0.2 MG tablet, TAKE 1 TABLET BY MOUTH TWICE DAILY, Disp: 180 tablet, Rfl: 0   Continuous Blood Gluc Sensor (FREESTYLE LIBRE 3 SENSOR) MISC, Place sensor on the arm  every 14 days and use to monitor blood sugars continuously as directed., Disp: 6 each, Rfl: 3   Cyanocobalamin (VITAMIN B 12 PO), Take 1,000 mcg by mouth daily. , Disp: , Rfl:    dapagliflozin propanediol ([name]) 10 MG TABS tablet, Take 10 mg by mouth daily., Disp: , Rfl:    Dulaglutide (TRULICITY) 1.5 MG/0.5ML SOPN, Inject 1.5 mg into the skin once a week. Via [employer] patient assistance through Dec 2023, Disp: , Rfl:    erythromycin ophthalmic ointment, Apply 1 a small amount into right eye every night, Disp: , Rfl:    finasteride (PROSCAR) 5 MG tablet, TAKE ONE TABLET BY MOUTH DAILY, Disp: 30 tablet, Rfl: 11   furosemide (LASIX) 40 MG tablet, Take 40 mg by mouth 2 (two) times daily. 2tab AM and 1tab PM., Disp: , Rfl:    gabapentin (NEURONTIN) 600 MG tablet, TAKE 1 TABLET BY MOUTH 2 TIMES DAILY, Disp: 180 tablet, Rfl: 4   hydrALAZINE (APRESOLINE) 100 MG tablet, Take 1 tablet (100 mg total) by mouth 3 (three) times daily., Disp: 270 tablet, Rfl: 4   Insulin Glargine (BASAGLAR KWIKPEN) 100 UNIT/ML, Inject 25 Units into the skin at bedtime. Via [employer] patient assistance through Dec  2024, Disp: , Rfl:    insulin lispro (HUMALOG KWIKPEN) 100 UNIT/ML KwikPen, Inject 6-10 Units into the skin 3 (three) times daily. Sliding scale. Via [employer] patient assistance through Dec 2023, Disp: , Rfl:    Insulin Pen Needle (BD PEN NEEDLE NANO U/F) 32G X 4 MM MISC, USE EVERY DAY AS DIRECTED, Disp: 100 each, Rfl: 3   isosorbide mononitrate (IMDUR) 60 MG 24 hr tablet, TAKE 1 TABLET BY MOUTH DAILY, Disp: 90 tablet, Rfl: 4   levothyroxine (SYNTHROID) 125 MCG tablet, TAKE 1 TABLET EVERY DAY ON EMPTY STOMACHWITH A GLASS OF WATER AT LEAST 30-60 MINBEFORE BREAKFAST, Disp: 90 tablet, Rfl: 4   losartan (COZAAR) 100 MG tablet, Take 1 tablet (100 mg total) by mouth daily., Disp: 30 tablet, Rfl: 1   Lysine 500 MG CAPS, Take 1 capsule by mouth daily., Disp: , Rfl:    ONETOUCH ULTRA test strip, USE AS DIRECTED THREE  TIMES A DAY, Disp: 100 each, Rfl: 3   pantoprazole (PROTONIX) 40 MG tablet, TAKE ONE TABLET BY MOUTH DAILY BEFORE BREAKFAST, Disp: 30 tablet, Rfl: 0   rosuvastatin (CRESTOR) 20 MG tablet, Take 1 tablet (20 mg total) by mouth daily. (TAKE IN PLACE OF ATORVASTATIN AND CANCEL ATORVASTATIN REFILLS), Disp: 90 tablet, Rfl: 3   senna (SENOKOT) 8.6 MG tablet, Take by mouth. (Patient not taking: Reported on 12/18/2022), Disp: , Rfl:    SENNA PO, Take 1 capsule by mouth in the morning, at noon, in the evening, and at bedtime., Disp: , Rfl:    SYMBICORT 80-4.5 MCG/ACT inhaler, INHALE 2 PUFFS INTO LUNGS TWICE DAILY, Disp: 10.2 g, Rfl: 0   terazosin (HYTRIN) 5 MG capsule, TAKE 1 CAPSULE BY MOUTH DAILY. TAKE IN PLACE OF DOXAZOSIN., Disp: 90 capsule, Rfl: 4   VITAMIN D, CHOLECALCIFEROL, PO, Take by mouth., Disp: , Rfl:   Social History   Tobacco Use  Smoking Status Former   Current packs/day: 0.00   Average packs/day: 1 pack/day for 25.0 years (25.0 ttl pk-yrs)   Types: Cigarettes   Start date: 05/30/1963   Quit date: 05/29/1988   Years since quitting: 34.5  Smokeless Tobacco Current   Types: Chew  Tobacco Comments   chews 1 bag of loose leaf chew/week    Allergies  Allergen Reactions   Chlorthalidone Other (See Comments)    D/c by dr. [name] for worsening renal function 03/2019   Spironolactone Other (See Comments)    gynecomastia   Objective:  There were no vitals filed for this visit. There is no height or weight on file to calculate BMI. Constitutional Well developed. Well nourished.  Vascular Dorsalis pedis pulses palpable bilaterally. Posterior tibial pulses palpable bilaterally. Capillary refill normal to all digits.  No cyanosis or clubbing noted. Pedal hair growth normal.  Neurologic Normal speech. Oriented to person, place, and time. Epicritic sensation to light touch grossly present bilaterally.  Dermatologic Painful ingrowing nail at lateral nail borders of the hallux nail  left. No other open wounds. No skin lesions.  Orthopedic: Normal joint ROM without pain or crepitus bilaterally. No visible deformities. No bony tenderness.   Radiographs: None Assessment:   1. Ingrown left big toenail    Plan:  Patient was evaluated and treated and all questions answered.  Ingrown Nail, left -Patient elects to proceed with minor surgery to remove ingrown toenail removal today. Consent reviewed and signed by patient. -Ingrown nail excised. See procedure note. -Educated on post-procedure care including soaking. Written instructions provided and reviewed. -Patient to  follow up in 2 weeks for nail check.  Procedure: Excision of Ingrown Toenail Location: Left 1st toe lateral nail borders. Anesthesia: Lidocaine 1% plain; 1.5 mL and Marcaine 0.5% plain; 1.5 mL, digital block. Skin Prep: Betadine. Dressing: Silvadene; telfa; dry, sterile, compression dressing. Technique: Following skin prep, the toe was exsanguinated and a tourniquet was secured at the base of the toe. The affected nail border was freed, split with a nail splitter, and excised. Chemical matrixectomy was then performed with phenol and irrigated out with alcohol. The tourniquet was then removed and sterile dressing applied. Disposition: Patient tolerated procedure well. Patient to return in 2 weeks for follow-up.   No follow-ups on file.

## 2022-12-19 NOTE — Patient Instructions (Signed)

## 2022-12-20 LAB — T4, FREE: Free T4: 1.55 ng/dL (ref 0.82–1.77)

## 2022-12-20 LAB — BRAIN NATRIURETIC PEPTIDE: BNP: 43.9 pg/mL (ref 0.0–100.0)

## 2022-12-20 LAB — TSH: TSH: 1.5 u[IU]/mL (ref 0.450–4.500)

## 2022-12-21 DIAGNOSIS — M5451 Vertebrogenic low back pain: Secondary | ICD-10-CM | POA: Diagnosis not present

## 2022-12-21 DIAGNOSIS — M461 Sacroiliitis, not elsewhere classified: Secondary | ICD-10-CM | POA: Diagnosis not present

## 2022-12-21 DIAGNOSIS — M9903 Segmental and somatic dysfunction of lumbar region: Secondary | ICD-10-CM | POA: Diagnosis not present

## 2022-12-21 DIAGNOSIS — M9904 Segmental and somatic dysfunction of sacral region: Secondary | ICD-10-CM | POA: Diagnosis not present

## 2022-12-22 DIAGNOSIS — M5451 Vertebrogenic low back pain: Secondary | ICD-10-CM | POA: Diagnosis not present

## 2022-12-22 DIAGNOSIS — M461 Sacroiliitis, not elsewhere classified: Secondary | ICD-10-CM | POA: Diagnosis not present

## 2022-12-22 DIAGNOSIS — R011 Cardiac murmur, unspecified: Secondary | ICD-10-CM | POA: Insufficient documentation

## 2022-12-22 DIAGNOSIS — M9904 Segmental and somatic dysfunction of sacral region: Secondary | ICD-10-CM | POA: Diagnosis not present

## 2022-12-22 DIAGNOSIS — M9903 Segmental and somatic dysfunction of lumbar region: Secondary | ICD-10-CM | POA: Diagnosis not present

## 2022-12-22 NOTE — Patient Instructions (Signed)
.   Please review the attached list of medications and notify my office if there are any errors.   . Please bring all of your medications to every appointment so we can make sure that our medication list is the same as yours.   

## 2022-12-22 NOTE — Progress Notes (Signed)
Established patient visit   Patient: Jacob Herrera   DOB: [birth year]   72 y.o. Male  MRN: [id number] Visit Date: 12/18/2022  Today's healthcare provider: Mila Merry, MD   Chief Complaint  Patient presents with   Medical Management of Chronic Issues   Subjective    Discussed the use of AI scribe software for clinical note transcription with the patient, who gave verbal consent to proceed.  History of Present Illness   The patient presents for follow up diabetes, hypertension, lipids, and CKD with a complaint of increased lower extremity swelling and fatigue. He reports a significant increase in swelling in his feet and ankles, which has developed over the past three to four months. The swelling has progressed to the point where his feet and ankles "blow up like a balloon."  In addition to the swelling, the patient has been experiencing increased fatigue and shortness of breath. He describes difficulty in performing any strenuous activities due to breathlessness. The patient also reports chronic back and hip pain, which has been worsening over the past couple of months. The pain is severe enough to disrupt his sleep and limit his mobility.  The patient has a history of diabetes and is currently managing his blood sugar levels with insulin. He reports his blood sugar levels have been stable, with an a1c today of 6.3. He is currently taking 25 units of insulin glargine in the morning and an additional 6 to 9 units before meals, depending on his blood sugar levels. He also takes Trulicity once a week.  He is due to have blood work done at the nephrologist's office and is currently managing his blood pressure medications. He request a 90-day supply of all future medication refills, including finasteride which he is now in need of refill for.       Medications: Outpatient Medications Prior to Visit  Medication Sig   acetaminophen (TYLENOL) 500 MG tablet Take 500 mg by mouth every  6 (six) hours as needed.   albuterol (PROVENTIL) (2.5 MG/3ML) 0.083% nebulizer solution Take 3 mLs (2.5 mg total) by nebulization every 4 (four) hours. And PRN (Patient taking differently: Take 2.5 mg by nebulization as needed.)   amLODipine (NORVASC) 10 MG tablet Take 1 tablet (10 mg total) by mouth daily.   aspirin 81 MG tablet Take 81 mg by mouth daily.   carvedilol (COREG) 25 MG tablet TAKE 1 TABLET BY MOUTH TWICE DAILY   cetirizine (ZYRTEC) 10 MG tablet Take 10 mg by mouth daily.   cloNIDine (CATAPRES) 0.2 MG tablet TAKE 1 TABLET BY MOUTH TWICE DAILY   Continuous Blood Gluc Sensor (FREESTYLE LIBRE 3 SENSOR) MISC Place sensor on the arm every 14 days and use to monitor blood sugars continuously as directed.   Cyanocobalamin (VITAMIN B 12 PO) Take 1,000 mcg by mouth daily.    dapagliflozin propanediol ([name]) 10 MG TABS tablet Take 10 mg by mouth daily.   Dulaglutide (TRULICITY) 1.5 MG/0.5ML SOPN Inject 1.5 mg into the skin once a week. Via [employer] patient assistance through Dec 2023   erythromycin ophthalmic ointment Apply 1 a small amount into right eye every night   finasteride (PROSCAR) 5 MG tablet TAKE ONE TABLET BY MOUTH DAILY   furosemide (LASIX) 40 MG tablet Take 40 mg by mouth 2 (two) times daily. 2tab AM and 1tab PM.   gabapentin (NEURONTIN) 600 MG tablet TAKE 1 TABLET BY MOUTH 2 TIMES DAILY   hydrALAZINE (APRESOLINE) 100 MG tablet  Take 1 tablet (100 mg total) by mouth 3 (three) times daily.   Insulin Glargine (BASAGLAR KWIKPEN) 100 UNIT/ML Inject 25 Units into the skin at bedtime. Via [employer] patient assistance through Dec 2024   insulin lispro (HUMALOG KWIKPEN) 100 UNIT/ML KwikPen Inject 6-10 Units into the skin 3 (three) times daily. Sliding scale. Via [employer] patient assistance through Dec 2023   Insulin Pen Needle (BD PEN NEEDLE NANO U/F) 32G X 4 MM MISC USE EVERY DAY AS DIRECTED   isosorbide mononitrate (IMDUR) 60 MG 24 hr tablet TAKE 1 TABLET BY MOUTH DAILY    levothyroxine (SYNTHROID) 125 MCG tablet TAKE 1 TABLET EVERY DAY ON EMPTY STOMACHWITH A GLASS OF WATER AT LEAST 30-60 MINBEFORE BREAKFAST   losartan (COZAAR) 100 MG tablet Take 1 tablet (100 mg total) by mouth daily.   Lysine 500 MG CAPS Take 1 capsule by mouth daily.   ONETOUCH ULTRA test strip USE AS DIRECTED THREE TIMES A DAY   pantoprazole (PROTONIX) 40 MG tablet TAKE ONE TABLET BY MOUTH DAILY BEFORE BREAKFAST   rosuvastatin (CRESTOR) 20 MG tablet Take 1 tablet (20 mg total) by mouth daily. (TAKE IN PLACE OF ATORVASTATIN AND CANCEL ATORVASTATIN REFILLS)   SENNA PO Take 1 capsule by mouth in the morning, at noon, in the evening, and at bedtime.   SYMBICORT 80-4.5 MCG/ACT inhaler INHALE 2 PUFFS INTO LUNGS TWICE DAILY   terazosin (HYTRIN) 5 MG capsule TAKE 1 CAPSULE BY MOUTH DAILY. TAKE IN PLACE OF DOXAZOSIN.   VITAMIN D, CHOLECALCIFEROL, PO Take by mouth.   senna (SENOKOT) 8.6 MG tablet Take by mouth. (Patient not taking: Reported on 12/18/2022)   No facility-administered medications prior to visit.   Review of Systems  Constitutional:  Positive for fatigue. Negative for appetite change, chills and fever.  Respiratory:  Positive for shortness of breath. Negative for chest tightness and wheezing.   Cardiovascular:  Positive for leg swelling. Negative for chest pain and palpitations.  Gastrointestinal:  Negative for abdominal pain, nausea and vomiting.        Objective    BP 127/61   Pulse 62   Ht 5\' 8"  (1.727 m)   Wt 249 lb 8 oz (113.2 kg)   SpO2 97%   BMI 37.94 kg/m   Physical Exam    General: Appearance:    Mildly obese male in no acute distress  Eyes:    PERRL, conjunctiva/corneas clear, EOM's intact       Lungs:     Clear to auscultation bilaterally, respirations unlabored  Heart:    Normal heart rate. Normal rhythm.  2/6 systolic murmur at right upper sternal border   MS:   All extremities are intact.    Neurologic:   Awake, alert, oriented x 3. No apparent focal  neurological defect.        Lab Results  Component Value Date   HGBA1C 6.3 (A) 12/18/2022      Assessment & Plan        Lower extremity edema New onset over the past few months. No change in diet. Unclear etiology. -Order labs to evaluate for cardiac, renal, and thyroid causes. -Order chest x-ray to evaluate heart and lungs.  Dyspnea on exertion New onset, associated with fatigue. No rest dyspnea. -Order labs to evaluate for cardiac, renal, and thyroid causes. -Order chest x-ray to evaluate heart and lungs.  Chronic back and hip pain Severe, affecting sleep and mobility. No recent imaging. -No plan discussed in the conversation.  Type  2 Diabetes Mellitus Well controlled on current regimen of 25 units of Basaglar in the morning, 6-9 units of Humalog depending on pre-meal glucose, and weekly Trulicity. -Continue current regimen.  Medication refills Request to change certain medications to 90-day supply. -Change appropriate medications to 90-day supply.  Influenza vaccination Due for annual vaccination. -Administer influenza vaccine today.    No follow-ups on file.      Mila Merry, MD  [facility] [phone] (phone) [phone] (fax)  [facility]

## 2022-12-26 DIAGNOSIS — G4733 Obstructive sleep apnea (adult) (pediatric): Secondary | ICD-10-CM | POA: Diagnosis not present

## 2022-12-27 DIAGNOSIS — G4733 Obstructive sleep apnea (adult) (pediatric): Secondary | ICD-10-CM | POA: Diagnosis not present

## 2022-12-27 DIAGNOSIS — M461 Sacroiliitis, not elsewhere classified: Secondary | ICD-10-CM | POA: Diagnosis not present

## 2022-12-27 DIAGNOSIS — M9903 Segmental and somatic dysfunction of lumbar region: Secondary | ICD-10-CM | POA: Diagnosis not present

## 2022-12-27 DIAGNOSIS — M5451 Vertebrogenic low back pain: Secondary | ICD-10-CM | POA: Diagnosis not present

## 2022-12-27 DIAGNOSIS — J45909 Unspecified asthma, uncomplicated: Secondary | ICD-10-CM | POA: Diagnosis not present

## 2022-12-27 DIAGNOSIS — M9904 Segmental and somatic dysfunction of sacral region: Secondary | ICD-10-CM | POA: Diagnosis not present

## 2022-12-28 DIAGNOSIS — M461 Sacroiliitis, not elsewhere classified: Secondary | ICD-10-CM | POA: Diagnosis not present

## 2022-12-28 DIAGNOSIS — M9904 Segmental and somatic dysfunction of sacral region: Secondary | ICD-10-CM | POA: Diagnosis not present

## 2022-12-28 DIAGNOSIS — M5451 Vertebrogenic low back pain: Secondary | ICD-10-CM | POA: Diagnosis not present

## 2022-12-28 DIAGNOSIS — M9903 Segmental and somatic dysfunction of lumbar region: Secondary | ICD-10-CM | POA: Diagnosis not present

## 2022-12-29 MED ORDER — FINASTERIDE 5 MG PO TABS
5.0000 mg | ORAL_TABLET | Freq: Every day | ORAL | 1 refills | Status: DC
Start: 2022-12-29 — End: 2023-11-21

## 2022-12-29 MED ORDER — AMLODIPINE BESYLATE 10 MG PO TABS: 10.0000 mg | ORAL_TABLET | Freq: Every day | ORAL | 1 refills | Status: DC

## 2022-12-29 NOTE — Addendum Note (Signed)
Addended by: Malva Limes on: 12/29/2022 05:36 PM   Modules accepted: Orders

## 2023-01-03 ENCOUNTER — Other Ambulatory Visit: Payer: Self-pay | Admitting: Pharmacist

## 2023-01-03 NOTE — Patient Instructions (Addendum)
Goals Addressed             This Visit's Progress    Pharmacy Goals       It was a pleasure speaking with you today.  If you need to reach out to patient assistance programs regarding medication refills, you can do so using the following contact information:  - [employer] at [phone] regarding refills of [name] - [employer] at [phone] regarding refills of Trulicity, Humalog and Basaglar   Please be sure to outreach to the program when you have <4 weeks of medication remaining in order to allow time for the program to ship your refill.   Thank you!  Elisabeth Delles, PharmD, BCACP [facility] [phone]         

## 2023-01-03 NOTE — Progress Notes (Signed)
   01/03/2023  Patient ID: Jacob Herrera, male   DOB: Dec 24, 1950, 72 y.o.   MRN: [id number]  Receive a voicemail from patient requesting a call back. Return call.   Patient reports that he received a text message from [employer] and requests information regarding the process for re-enrollment in this patient assistance program for 2025 calendar year.   Counsel patient on starting process for 2025 re-enrollment for [name] assistance using the [employer] tool   Follow Up Plan: Clinical Pharmacist will follow up with patient by telephone on 02/28/2023 at 1 pm   [name], PharmD, [facility] [phone]

## 2023-01-05 ENCOUNTER — Encounter: Payer: Self-pay | Admitting: Pulmonary Disease

## 2023-01-05 ENCOUNTER — Ambulatory Visit: Payer: PPO | Admitting: Pulmonary Disease

## 2023-01-05 VITALS — BP 136/68 | HR 64 | Temp 97.6°F | Ht 68.0 in | Wt 253.6 lb

## 2023-01-05 DIAGNOSIS — J4520 Mild intermittent asthma, uncomplicated: Secondary | ICD-10-CM | POA: Diagnosis not present

## 2023-01-05 DIAGNOSIS — G4733 Obstructive sleep apnea (adult) (pediatric): Secondary | ICD-10-CM

## 2023-01-05 NOTE — Patient Instructions (Signed)
Will have [employer] arrange for refitting of your Bipap mask  Follow up in 6 months

## 2023-01-05 NOTE — Progress Notes (Signed)
[place] [facility]  Chief Complaint  Patient presents with   Follow-up    Patient reports wearing bipap nightly.     Past Surgical History:  He  has a past surgical history that includes Nasal sinus surgery (2005); Tonsillectomy and adenoidectomy (1950); Cardiac catheterization (N/A, 03/31/2015); Cardiac catheterization (N/A, 03/31/2015); Cardiac catheterization (N/A, 03/31/2015); Mohs surgery (Left, 05/12/2019); Cataract extraction w/PHACO (Right, 05/11/2020); Cataract extraction w/PHACO (Left, 06/02/2020); Esophagogastroduodenoscopy (egd) with propofol (N/A, 04/04/2021); and Colonoscopy with propofol (N/A, 04/04/2021).  Past Medical History:  CHF, Back pain, DM type 2, HTN, Hypothyroidism, CKD, Neuropathy, Scoliosis  Constitutional:  BP 136/68 (BP Location: Left Arm, Patient Position: Sitting, Cuff Size: Normal)   Pulse 64   Temp 97.6 F (36.4 C) (Temporal)   Ht 5\' 8"  (1.727 m)   Wt 253 lb 9.6 oz (115 kg)   SpO2 97%   BMI 38.56 kg/m   Brief Summary:  Jacob Herrera is a 72 y.o. male former smoker with obstructive sleep apnea.       Subjective:   He got his new Bipap device.  Working well.  Had several nasal surgeries and gets nasal collapse.  Has chronic sinus congestion.  Has been using full face mask, but liners don't last very long.  Sleeping well otherwise.  Not having cough, wheeze, sputum, or chest congestion.  Uses symbicort and nebulizer intermittently.  Physical Exam:   Appearance - well kempt   ENMT - no sinus tenderness, no oral exudate, no LAN, Mallampati 2 airway, no stridor, high arched palate  Respiratory - equal breath sounds bilaterally, no wheezing or rales  CV - s1s2 regular rate and rhythm, no murmurs  Ext - no clubbing, no edema  Skin - no rashes  Psych - normal mood and affect   Pulmonary testing:  PFT 06/05/13 >> FEV 1 1.95 (67%), FEV1% 82, TLC 3.91 (64%), DLCO 54%  Sleep Tests:  PSG 05/06/15 >> AHI  131.8, SpO2 low 61.9% Bipap 02/10/21 >> Bipap 16/10 cm H2O ONO with Bipap 08/18/22 >> test time 9 hrs 12 min. Basal SpO2 93.8%, low 88%. Spent 2 sec with SpO2 at 88%.  Auto Bipap 12/06/22 to 01/04/23 >> used on 30 of 30 nights with average 8 hrs 35 min.  Average AHI 2.8 with median Bipap 16/10 and 95 th percentile Bipap 19/13 cm H2O  Cardiac Tests:  Echo 08/29/17 >> EF 50%, mild LVH, mild MR  Social History:  He  reports that he quit smoking about 34 years ago. His smoking use included cigarettes. He started smoking about 59 years ago. He has a 25 pack-year smoking history. His smokeless tobacco use includes chew. He reports that he does not drink alcohol and does not use drugs.  Family History:  His family history includes Breast cancer in his mother; Cancer - Lung in his father; Cancer - Ovarian in his mother; Emphysema in his father.     Assessment/Plan:   Obstructive sleep apnea. - he is compliant with Bipap and reports benefit from therapy - he uses [employer] for his DME - current Bipap ordered April 2024 - continue auto Bipap with max IPAP 22, min EPAP 10, PS 6 cm H2O - will see if he can get fitted with a hybrid mask; this might help minimize nasal collapse  Mild, intermittent asthma. - prn symbicort  Time Spent Involved in Patient Care on Day of Examination:  26 minutes  Follow up:   Patient Instructions  Will have [employer]  arrange for refitting of your Bipap mask  Follow up in 6 months  Medication List:   Allergies as of 01/05/2023       Reactions   Chlorthalidone Other (See Comments)   D/c by dr. [name] for worsening renal function 03/2019   Spironolactone Other (See Comments)   gynecomastia        Medication List        Accurate as of January 05, 2023  2:23 PM. If you have any questions, ask your nurse or doctor.          acetaminophen 500 MG tablet Commonly known as: TYLENOL Take 500 mg by mouth every 6 (six) hours as needed.   albuterol (2.5  MG/3ML) 0.083% nebulizer solution Commonly known as: PROVENTIL Take 3 mLs (2.5 mg total) by nebulization every 4 (four) hours. And PRN What changed:  when to take this reasons to take this additional instructions   amLODipine 10 MG tablet Commonly known as: NORVASC Take 1 tablet (10 mg total) by mouth daily.   aspirin 81 MG tablet Take 81 mg by mouth daily.   Basaglar KwikPen 100 UNIT/ML Inject 25 Units into the skin at bedtime. Via [employer] patient assistance through Dec 2024   BD Pen Needle Nano U/F 32G X 4 MM Misc Generic drug: Insulin Pen Needle USE EVERY DAY AS DIRECTED   carvedilol 25 MG tablet Commonly known as: COREG TAKE 1 TABLET BY MOUTH TWICE DAILY   cetirizine 10 MG tablet Commonly known as: ZYRTEC Take 10 mg by mouth daily.   cloNIDine 0.2 MG tablet Commonly known as: CATAPRES TAKE 1 TABLET BY MOUTH TWICE DAILY   dapagliflozin propanediol 10 MG Tabs tablet Commonly known as: [name] Take 10 mg by mouth daily.   erythromycin ophthalmic ointment Apply 1 a small amount into right eye every night   finasteride 5 MG tablet Commonly known as: PROSCAR Take 1 tablet (5 mg total) by mouth daily.   FreeStyle Libre 3 Sensor Misc Place sensor on the arm every 14 days and use to monitor blood sugars continuously as directed.   furosemide 40 MG tablet Commonly known as: LASIX Take 40 mg by mouth 2 (two) times daily. 2tab AM and 1tab PM.   gabapentin 600 MG tablet Commonly known as: NEURONTIN TAKE 1 TABLET BY MOUTH 2 TIMES DAILY   hydrALAZINE 100 MG tablet Commonly known as: APRESOLINE Take 1 tablet (100 mg total) by mouth 3 (three) times daily.   insulin lispro 100 UNIT/ML KwikPen Commonly known as: HumaLOG KwikPen Inject 6-10 Units into the skin 3 (three) times daily. Sliding scale. Via [employer] patient assistance through Dec 2023   isosorbide mononitrate 60 MG 24 hr tablet Commonly known as: IMDUR TAKE 1 TABLET BY MOUTH DAILY   levothyroxine  125 MCG tablet Commonly known as: SYNTHROID TAKE 1 TABLET EVERY DAY ON EMPTY STOMACHWITH A GLASS OF WATER AT LEAST 30-60 MINBEFORE BREAKFAST   losartan 100 MG tablet Commonly known as: COZAAR Take 1 tablet (100 mg total) by mouth daily.   Lysine 500 MG Caps Take 1 capsule by mouth daily.   OneTouch Ultra test strip Generic drug: glucose blood USE AS DIRECTED THREE TIMES A DAY   pantoprazole 40 MG tablet Commonly known as: PROTONIX TAKE ONE TABLET BY MOUTH DAILY BEFORE BREAKFAST   rosuvastatin 20 MG tablet Commonly known as: Crestor Take 1 tablet (20 mg total) by mouth daily. (TAKE IN PLACE OF ATORVASTATIN AND CANCEL ATORVASTATIN REFILLS)   senna 8.6  MG tablet Commonly known as: SENOKOT Take by mouth.   SENNA PO Take 1 capsule by mouth in the morning, at noon, in the evening, and at bedtime.   Symbicort 80-4.5 MCG/ACT inhaler Generic drug: budesonide-formoterol INHALE 2 PUFFS INTO LUNGS TWICE DAILY   terazosin 5 MG capsule Commonly known as: HYTRIN TAKE 1 CAPSULE BY MOUTH DAILY. TAKE IN PLACE OF DOXAZOSIN.   Trulicity 1.5 MG/0.5ML Sopn Generic drug: Dulaglutide Inject 1.5 mg into the skin once a week. Via [employer] patient assistance through Dec 2023   VITAMIN B 12 PO Take 1,000 mcg by mouth daily.   VITAMIN D (CHOLECALCIFEROL) PO Take by mouth.        Signature:  Coralyn Helling, MD [facility] Pager - [phone] 01/05/2023, 2:23 PM

## 2023-01-09 DIAGNOSIS — M5116 Intervertebral disc disorders with radiculopathy, lumbar region: Secondary | ICD-10-CM | POA: Diagnosis not present

## 2023-01-09 DIAGNOSIS — M5126 Other intervertebral disc displacement, lumbar region: Secondary | ICD-10-CM | POA: Diagnosis not present

## 2023-01-09 DIAGNOSIS — M5136 Other intervertebral disc degeneration, lumbar region: Secondary | ICD-10-CM | POA: Diagnosis not present

## 2023-01-09 DIAGNOSIS — M48062 Spinal stenosis, lumbar region with neurogenic claudication: Secondary | ICD-10-CM | POA: Diagnosis not present

## 2023-01-09 DIAGNOSIS — M47816 Spondylosis without myelopathy or radiculopathy, lumbar region: Secondary | ICD-10-CM | POA: Diagnosis not present

## 2023-01-09 DIAGNOSIS — M5416 Radiculopathy, lumbar region: Secondary | ICD-10-CM | POA: Diagnosis not present

## 2023-01-09 DIAGNOSIS — M419 Scoliosis, unspecified: Secondary | ICD-10-CM | POA: Diagnosis not present

## 2023-01-15 DIAGNOSIS — Z85820 Personal history of malignant melanoma of skin: Secondary | ICD-10-CM | POA: Diagnosis not present

## 2023-01-15 DIAGNOSIS — C44311 Basal cell carcinoma of skin of nose: Secondary | ICD-10-CM | POA: Diagnosis not present

## 2023-01-15 DIAGNOSIS — D225 Melanocytic nevi of trunk: Secondary | ICD-10-CM | POA: Diagnosis not present

## 2023-01-15 DIAGNOSIS — D2262 Melanocytic nevi of left upper limb, including shoulder: Secondary | ICD-10-CM | POA: Diagnosis not present

## 2023-01-15 DIAGNOSIS — D2261 Melanocytic nevi of right upper limb, including shoulder: Secondary | ICD-10-CM | POA: Diagnosis not present

## 2023-01-15 DIAGNOSIS — C44629 Squamous cell carcinoma of skin of left upper limb, including shoulder: Secondary | ICD-10-CM | POA: Diagnosis not present

## 2023-01-15 DIAGNOSIS — D485 Neoplasm of uncertain behavior of skin: Secondary | ICD-10-CM | POA: Diagnosis not present

## 2023-01-15 DIAGNOSIS — C44212 Basal cell carcinoma of skin of right ear and external auricular canal: Secondary | ICD-10-CM | POA: Diagnosis not present

## 2023-01-15 DIAGNOSIS — D2271 Melanocytic nevi of right lower limb, including hip: Secondary | ICD-10-CM | POA: Diagnosis not present

## 2023-01-15 DIAGNOSIS — C44612 Basal cell carcinoma of skin of right upper limb, including shoulder: Secondary | ICD-10-CM | POA: Diagnosis not present

## 2023-01-15 DIAGNOSIS — Z85828 Personal history of other malignant neoplasm of skin: Secondary | ICD-10-CM | POA: Diagnosis not present

## 2023-01-15 DIAGNOSIS — C44229 Squamous cell carcinoma of skin of left ear and external auricular canal: Secondary | ICD-10-CM | POA: Diagnosis not present

## 2023-01-15 DIAGNOSIS — D045 Carcinoma in situ of skin of trunk: Secondary | ICD-10-CM | POA: Diagnosis not present

## 2023-01-15 DIAGNOSIS — Z872 Personal history of diseases of the skin and subcutaneous tissue: Secondary | ICD-10-CM | POA: Diagnosis not present

## 2023-01-15 DIAGNOSIS — L570 Actinic keratosis: Secondary | ICD-10-CM | POA: Diagnosis not present

## 2023-01-15 DIAGNOSIS — D2272 Melanocytic nevi of left lower limb, including hip: Secondary | ICD-10-CM | POA: Diagnosis not present

## 2023-01-15 DIAGNOSIS — C44519 Basal cell carcinoma of skin of other part of trunk: Secondary | ICD-10-CM | POA: Diagnosis not present

## 2023-01-15 DIAGNOSIS — C44619 Basal cell carcinoma of skin of left upper limb, including shoulder: Secondary | ICD-10-CM | POA: Diagnosis not present

## 2023-01-19 ENCOUNTER — Other Ambulatory Visit: Payer: Self-pay | Admitting: Family Medicine

## 2023-01-19 DIAGNOSIS — I1A0 Resistant hypertension: Secondary | ICD-10-CM

## 2023-01-19 NOTE — Telephone Encounter (Signed)
Requested medications are due for refill today.  yes  Requested medications are on the active medications list.  yes  Last refill. 11/30/2021 #270 4 rf  Future visit scheduled.   yes  Notes to clinic.  Labs are expired    Requested Prescriptions  Pending Prescriptions Disp Refills   hydrALAZINE (APRESOLINE) 100 MG tablet [Pharmacy Med Name: HYDRALAZINE HCL 100 MG TAB] 270 tablet 4    Sig: TAKE 1 TABLET BY MOUTH THREE TIMES DAILY     Cardiovascular:  Vasodilators Failed - 01/19/2023  4:49 PM      Failed - HCT in normal range and within 360 days    Hematocrit  Date Value Ref Range Status  12/09/2021 34.8 (L) 37.5 - 51.0 % Final         Failed - HGB in normal range and within 360 days    Hemoglobin  Date Value Ref Range Status  12/09/2021 11.2 (L) 13.0 - 17.7 g/dL Final         Failed - RBC in normal range and within 360 days    RBC  Date Value Ref Range Status  12/09/2021 3.73 (L) 4.14 - 5.80 x10E6/uL Final  11/08/2020 3.57 (A) 3.87 - 5.11 Final         Failed - WBC in normal range and within 360 days    WBC  Date Value Ref Range Status  12/09/2021 5.9 3.4 - 10.8 x10E3/uL Final  07/08/2013 8.0 3.8 - 10.6 x10 3/mm 3 Final         Failed - PLT in normal range and within 360 days    Platelets  Date Value Ref Range Status  12/09/2021 239 150 - 450 x10E3/uL Final         Failed - ANA Screen, Ifa, Serum in normal range and within 360 days    No results found for: "ANA", "ANATITER", "LABANTI"       Passed - Last BP in normal range    BP Readings from Last 1 Encounters:  01/05/23 136/68         Passed - Valid encounter within last 12 months    Recent Outpatient Visits           1 month ago Diabetes mellitus with nephropathy (HCC)   [facility] Malva Limes, MD   5 months ago Diabetes mellitus with nephropathy [facility])   [facility] Malva Limes, MD   9 months ago Hyperlipidemia, unspecified hyperlipidemia  type   [facility] Malva Limes, MD   1 year ago Prostate cancer screening   [place] [facility] Malva Limes, MD   1 year ago Resistant hypertension   [place] [facility] Malva Limes, MD       Future Appointments             In 1 month [name], Demetrios Isaacs, MD [facility]   In 10 months Stoioff, Verna Czech, MD [facility] [place]

## 2023-01-25 ENCOUNTER — Ambulatory Visit: Payer: PPO | Admitting: Podiatry

## 2023-01-25 DIAGNOSIS — I10 Essential (primary) hypertension: Secondary | ICD-10-CM | POA: Diagnosis not present

## 2023-01-25 DIAGNOSIS — L600 Ingrowing nail: Secondary | ICD-10-CM

## 2023-01-25 DIAGNOSIS — I5033 Acute on chronic diastolic (congestive) heart failure: Secondary | ICD-10-CM | POA: Diagnosis not present

## 2023-01-25 DIAGNOSIS — I25118 Atherosclerotic heart disease of native coronary artery with other forms of angina pectoris: Secondary | ICD-10-CM | POA: Diagnosis not present

## 2023-01-25 DIAGNOSIS — G4733 Obstructive sleep apnea (adult) (pediatric): Secondary | ICD-10-CM | POA: Diagnosis not present

## 2023-01-25 DIAGNOSIS — E782 Mixed hyperlipidemia: Secondary | ICD-10-CM | POA: Diagnosis not present

## 2023-01-25 NOTE — Progress Notes (Signed)
Subjective:  Patient ID: Jacob Herrera, male    DOB: 05-05-1950,  MRN: [id number]  Chief Complaint  Patient presents with   Nail Problem    Right hallux nail discomfort     72 y.o. male presents with the above complaint.  Patient presents with right hallux medial border ingrown painful to touch is progressive and worse worse with ambulation worse with pressure he would like to have removed he has not seen anyone else prior to seeing me denies any other acute complaints.  The other site is healing well.   Review of Systems: Negative except as noted in the HPI. Denies N/V/F/Ch.  Past Medical History:  Diagnosis Date   Asthma    CHF (congestive heart failure) (HCC)    Chronic lower back pain    Complication of anesthesia    unknow "difficulty" after cardiac cath   Diabetes mellitus, type 2 (HCC)    History of chicken pox    Hx of skin cancer, basal cell    Hypertension    Hypothyroidism    Kidney disease    stage 3   Neuropathy    feet   Scoliosis    Sleep apnea    BiPAP   Thyrotoxicosis    2010    Current Outpatient Medications:    acetaminophen (TYLENOL) 500 MG tablet, Take 500 mg by mouth every 6 (six) hours as needed., Disp: , Rfl:    albuterol (PROVENTIL) (2.5 MG/3ML) 0.083% nebulizer solution, Take 3 mLs (2.5 mg total) by nebulization every 4 (four) hours. And PRN (Patient taking differently: Take 2.5 mg by nebulization as needed.), Disp: 75 mL, Rfl: 12   amLODipine (NORVASC) 10 MG tablet, Take 1 tablet (10 mg total) by mouth daily., Disp: 90 tablet, Rfl: 1   aspirin 81 MG tablet, Take 81 mg by mouth daily., Disp: , Rfl:    carvedilol (COREG) 25 MG tablet, TAKE 1 TABLET BY MOUTH TWICE DAILY, Disp: 180 tablet, Rfl: 4   cetirizine (ZYRTEC) 10 MG tablet, Take 10 mg by mouth daily., Disp: , Rfl:    cloNIDine (CATAPRES) 0.2 MG tablet, TAKE 1 TABLET BY MOUTH TWICE DAILY, Disp: 180 tablet, Rfl: 0   Continuous Blood Gluc Sensor (FREESTYLE LIBRE 3 SENSOR) MISC, Place  sensor on the arm every 14 days and use to monitor blood sugars continuously as directed., Disp: 6 each, Rfl: 3   Cyanocobalamin (VITAMIN B 12 PO), Take 1,000 mcg by mouth daily. , Disp: , Rfl:    dapagliflozin propanediol ([name]) 10 MG TABS tablet, Take 10 mg by mouth daily., Disp: , Rfl:    Dulaglutide (TRULICITY) 1.5 MG/0.5ML SOPN, Inject 1.5 mg into the skin once a week. Via [employer] patient assistance through Dec 2023, Disp: , Rfl:    erythromycin ophthalmic ointment, Apply 1 a small amount into right eye every night, Disp: , Rfl:    finasteride (PROSCAR) 5 MG tablet, Take 1 tablet (5 mg total) by mouth daily., Disp: 90 tablet, Rfl: 1   furosemide (LASIX) 40 MG tablet, Take 40 mg by mouth 2 (two) times daily. 2tab AM and 1tab PM., Disp: , Rfl:    gabapentin (NEURONTIN) 600 MG tablet, TAKE 1 TABLET BY MOUTH 2 TIMES DAILY, Disp: 180 tablet, Rfl: 4   hydrALAZINE (APRESOLINE) 100 MG tablet, TAKE 1 TABLET BY MOUTH THREE TIMES DAILY, Disp: 270 tablet, Rfl: 4   Insulin Glargine (BASAGLAR KWIKPEN) 100 UNIT/ML, Inject 25 Units into the skin at bedtime. Via [employer] patient assistance  through Dec 2024, Disp: , Rfl:    insulin lispro (HUMALOG KWIKPEN) 100 UNIT/ML KwikPen, Inject 6-10 Units into the skin 3 (three) times daily. Sliding scale. Via [employer] patient assistance through Dec 2023, Disp: , Rfl:    Insulin Pen Needle (BD PEN NEEDLE NANO U/F) 32G X 4 MM MISC, USE EVERY DAY AS DIRECTED, Disp: 100 each, Rfl: 3   isosorbide mononitrate (IMDUR) 60 MG 24 hr tablet, TAKE 1 TABLET BY MOUTH DAILY, Disp: 90 tablet, Rfl: 4   levothyroxine (SYNTHROID) 125 MCG tablet, TAKE 1 TABLET EVERY DAY ON EMPTY STOMACHWITH A GLASS OF WATER AT LEAST 30-60 MINBEFORE BREAKFAST, Disp: 90 tablet, Rfl: 4   losartan (COZAAR) 100 MG tablet, Take 1 tablet (100 mg total) by mouth daily., Disp: 30 tablet, Rfl: 1   Lysine 500 MG CAPS, Take 1 capsule by mouth daily., Disp: , Rfl:    ONETOUCH ULTRA test strip, USE AS DIRECTED  THREE TIMES A DAY, Disp: 100 each, Rfl: 3   pantoprazole (PROTONIX) 40 MG tablet, TAKE ONE TABLET BY MOUTH DAILY BEFORE BREAKFAST, Disp: 30 tablet, Rfl: 0   rosuvastatin (CRESTOR) 20 MG tablet, Take 1 tablet (20 mg total) by mouth daily. (TAKE IN PLACE OF ATORVASTATIN AND CANCEL ATORVASTATIN REFILLS), Disp: 90 tablet, Rfl: 3   senna (SENOKOT) 8.6 MG tablet, Take by mouth., Disp: , Rfl:    SENNA PO, Take 1 capsule by mouth in the morning, at noon, in the evening, and at bedtime., Disp: , Rfl:    SYMBICORT 80-4.5 MCG/ACT inhaler, INHALE 2 PUFFS INTO LUNGS TWICE DAILY, Disp: 10.2 g, Rfl: 0   terazosin (HYTRIN) 5 MG capsule, TAKE 1 CAPSULE BY MOUTH DAILY. TAKE IN PLACE OF DOXAZOSIN., Disp: 90 capsule, Rfl: 4   VITAMIN D, CHOLECALCIFEROL, PO, Take by mouth., Disp: , Rfl:   Social History   Tobacco Use  Smoking Status Former   Current packs/day: 0.00   Average packs/day: 1 pack/day for 25.0 years (25.0 ttl pk-yrs)   Types: Cigarettes   Start date: 05/30/1963   Quit date: 05/29/1988   Years since quitting: 34.7  Smokeless Tobacco Current   Types: Chew  Tobacco Comments   chews 1 bag of loose leaf chew/week    Allergies  Allergen Reactions   Chlorthalidone Other (See Comments)    D/c by dr. [name] for worsening renal function 03/2019   Spironolactone Other (See Comments)    gynecomastia   Objective:  There were no vitals filed for this visit. There is no height or weight on file to calculate BMI. Constitutional Well developed. Well nourished.  Vascular Dorsalis pedis pulses palpable bilaterally. Posterior tibial pulses palpable bilaterally. Capillary refill normal to all digits.  No cyanosis or clubbing noted. Pedal hair growth normal.  Neurologic Normal speech. Oriented to person, place, and time. Epicritic sensation to light touch grossly present bilaterally.  Dermatologic Painful ingrowing nail at medial nail borders of the hallux nail right. No other open wounds. No skin  lesions.  Orthopedic: Normal joint ROM without pain or crepitus bilaterally. No visible deformities. No bony tenderness.   Radiographs: None Assessment:   1. Ingrown toenail of right foot    Plan:  Patient was evaluated and treated and all questions answered.  Ingrown Nail, right -Patient elects to proceed with minor surgery to remove ingrown toenail removal today. Consent reviewed and signed by patient. -Ingrown nail excised. See procedure note. -Educated on post-procedure care including soaking. Written instructions provided and reviewed. -Patient to follow up in  2 weeks for nail check.  Procedure: Excision of Ingrown Toenail Location: Right 1st toe medial nail borders. Anesthesia: Lidocaine 1% plain; 1.5 mL and Marcaine 0.5% plain; 1.5 mL, digital block. Skin Prep: Betadine. Dressing: Silvadene; telfa; dry, sterile, compression dressing. Technique: Following skin prep, the toe was exsanguinated and a tourniquet was secured at the base of the toe. The affected nail border was freed, split with a nail splitter, and excised. Chemical matrixectomy was then performed with phenol and irrigated out with alcohol. The tourniquet was then removed and sterile dressing applied. Disposition: Patient tolerated procedure well. Patient to return in 2 weeks for follow-up.   No follow-ups on file.

## 2023-01-26 ENCOUNTER — Telehealth: Payer: Self-pay

## 2023-01-26 DIAGNOSIS — M5416 Radiculopathy, lumbar region: Secondary | ICD-10-CM | POA: Diagnosis not present

## 2023-01-26 DIAGNOSIS — M48062 Spinal stenosis, lumbar region with neurogenic claudication: Secondary | ICD-10-CM | POA: Diagnosis not present

## 2023-01-26 NOTE — Patient Outreach (Signed)
Care Management   Outreach Note  01/26/2023 Name: Jacob Herrera MRN: [id number] DOB: 03/12/1951  An unsuccessful telephone outreach was attempted today to contact the patient about Care Management needs.     Follow Up Plan:  A HIPAA compliant phone message was left for the patient providing contact information and requesting a return call.     Katina Degree [facility]  [facility], [facility] RN Care Manager Direct Dial: [phone] Website: [email or web address]

## 2023-02-06 DIAGNOSIS — C44229 Squamous cell carcinoma of skin of left ear and external auricular canal: Secondary | ICD-10-CM | POA: Diagnosis not present

## 2023-02-09 ENCOUNTER — Other Ambulatory Visit: Payer: Self-pay

## 2023-02-09 NOTE — Patient Outreach (Signed)
Care Management   Visit Note  02/09/2023 Name: Jacob Herrera MRN: [id number] DOB: February 04, 1951  Subjective: Jacob Herrera is a 72 y.o. year old male who is a primary care patient of [name], Demetrios Isaacs, MD. The Care Management team was consulted for assistance.      Engaged with patient via telephone. Jacob Herrera reports being in the pharmacy at the time of call. Notes discrepancies with the amount of pills dispensed with one of his prescriptions ordered by Cardiologist. Offered to contact Cardiology clinic today if new orders are needed. He declined. Pharmacy will fill 15 day supply of Cardiology medications today. He will follow up with Dr. Shane Crutch Cardiology on 02/12/23 and request order for 30 day supply.  Requested to complete Care Management outreach on a later date. Anticipates being available on 11/4. Appointment scheduled for 02/26/23 at 2 pm. Agreed to call if he requires assistance prior to outreach or if the appointment needs to be rescheduled.   PLAN Will follow up on 02/26/23.   Katina [facility]  [facility], [facility] RN Care Manager Direct Dial: [phone] Website: [email or web address]

## 2023-02-12 DIAGNOSIS — I25118 Atherosclerotic heart disease of native coronary artery with other forms of angina pectoris: Secondary | ICD-10-CM | POA: Diagnosis not present

## 2023-02-15 DIAGNOSIS — M1611 Unilateral primary osteoarthritis, right hip: Secondary | ICD-10-CM | POA: Diagnosis not present

## 2023-02-15 DIAGNOSIS — M47816 Spondylosis without myelopathy or radiculopathy, lumbar region: Secondary | ICD-10-CM | POA: Diagnosis not present

## 2023-02-15 DIAGNOSIS — M160 Bilateral primary osteoarthritis of hip: Secondary | ICD-10-CM | POA: Diagnosis not present

## 2023-02-15 DIAGNOSIS — M48062 Spinal stenosis, lumbar region with neurogenic claudication: Secondary | ICD-10-CM | POA: Diagnosis not present

## 2023-02-15 DIAGNOSIS — C44519 Basal cell carcinoma of skin of other part of trunk: Secondary | ICD-10-CM | POA: Diagnosis not present

## 2023-02-15 DIAGNOSIS — M5416 Radiculopathy, lumbar region: Secondary | ICD-10-CM | POA: Diagnosis not present

## 2023-02-20 ENCOUNTER — Encounter: Payer: Self-pay | Admitting: Podiatry

## 2023-02-20 ENCOUNTER — Ambulatory Visit: Payer: PPO | Admitting: Podiatry

## 2023-02-20 VITALS — BP 154/75 | HR 65

## 2023-02-20 DIAGNOSIS — L600 Ingrowing nail: Secondary | ICD-10-CM

## 2023-02-20 NOTE — Progress Notes (Signed)
Subjective:  Patient ID: Jacob Herrera, male    DOB: June 04, 1950,  MRN: [id number]  Chief Complaint  Patient presents with   Nail Problem    "The right side of my big toe is still giving me problems.  He told me to wait a couple of weeks to see how it was doing."    72 y.o. male presents with the above complaint.  Patient presents with right hallux lateral border ingrown painful to touch as well as and worse worse with ambulation worse with pressure he would like to have removed he denies any other acute complaints.  Pain scale 7 out of 10 dull achy in nature.   Review of Systems: Negative except as noted in the HPI. Denies N/V/F/Ch.  Past Medical History:  Diagnosis Date   Asthma    CHF (congestive heart failure) (HCC)    Chronic lower back pain    Complication of anesthesia    unknow "difficulty" after cardiac cath   Diabetes mellitus, type 2 (HCC)    History of chicken pox    Hx of skin cancer, basal cell    Hypertension    Hypothyroidism    Kidney disease    stage 3   Neuropathy    feet   Scoliosis    Sleep apnea    BiPAP   Thyrotoxicosis    2010    Current Outpatient Medications:    acetaminophen (TYLENOL) 500 MG tablet, Take 500 mg by mouth every 6 (six) hours as needed., Disp: , Rfl:    albuterol (PROVENTIL) (2.5 MG/3ML) 0.083% nebulizer solution, Take 3 mLs (2.5 mg total) by nebulization every 4 (four) hours. And PRN (Patient taking differently: Take 2.5 mg by nebulization as needed.), Disp: 75 mL, Rfl: 12   amLODipine (NORVASC) 10 MG tablet, Take 1 tablet (10 mg total) by mouth daily., Disp: 90 tablet, Rfl: 1   aspirin 81 MG tablet, Take 81 mg by mouth daily., Disp: , Rfl:    carvedilol (COREG) 25 MG tablet, TAKE 1 TABLET BY MOUTH TWICE DAILY, Disp: 180 tablet, Rfl: 4   cetirizine (ZYRTEC) 10 MG tablet, Take 10 mg by mouth daily., Disp: , Rfl:    Continuous Blood Gluc Sensor (FREESTYLE LIBRE 3 SENSOR) MISC, Place sensor on the arm every 14 days and use to  monitor blood sugars continuously as directed., Disp: 6 each, Rfl: 3   Cyanocobalamin (VITAMIN B 12 PO), Take 1,000 mcg by mouth daily. , Disp: , Rfl:    dapagliflozin propanediol ([name]) 10 MG TABS tablet, Take 10 mg by mouth daily., Disp: , Rfl:    Dulaglutide (TRULICITY) 1.5 MG/0.5ML SOPN, Inject 1.5 mg into the skin once a week. Via [employer] patient assistance through Dec 2023, Disp: , Rfl:    erythromycin ophthalmic ointment, Apply 1 a small amount into right eye every night, Disp: , Rfl:    finasteride (PROSCAR) 5 MG tablet, Take 1 tablet (5 mg total) by mouth daily., Disp: 90 tablet, Rfl: 1   furosemide (LASIX) 40 MG tablet, Take 40 mg by mouth 2 (two) times daily. 2tab AM and 1tab PM. (Patient not taking: Reported on 02/26/2023), Disp: , Rfl:    gabapentin (NEURONTIN) 600 MG tablet, TAKE 1 TABLET BY MOUTH 2 TIMES DAILY, Disp: 180 tablet, Rfl: 4   hydrALAZINE (APRESOLINE) 100 MG tablet, TAKE 1 TABLET BY MOUTH THREE TIMES DAILY, Disp: 270 tablet, Rfl: 4   insulin lispro (HUMALOG KWIKPEN) 100 UNIT/ML KwikPen, Inject 6-10 Units into the skin  3 (three) times daily. Sliding scale. Via [employer] patient assistance through Dec 2023, Disp: , Rfl:    Insulin Pen Needle (BD PEN NEEDLE NANO U/F) 32G X 4 MM MISC, USE EVERY DAY AS DIRECTED, Disp: 100 each, Rfl: 3   isosorbide mononitrate (IMDUR) 60 MG 24 hr tablet, TAKE 1 TABLET BY MOUTH DAILY, Disp: 90 tablet, Rfl: 4   levothyroxine (SYNTHROID) 125 MCG tablet, TAKE 1 TABLET EVERY DAY ON EMPTY STOMACHWITH A GLASS OF WATER AT LEAST 30-60 MINBEFORE BREAKFAST, Disp: 90 tablet, Rfl: 4   losartan (COZAAR) 100 MG tablet, Take 1 tablet (100 mg total) by mouth daily., Disp: 30 tablet, Rfl: 1   Lysine 500 MG CAPS, Take 1 capsule by mouth daily., Disp: , Rfl:    pantoprazole (PROTONIX) 40 MG tablet, TAKE ONE TABLET BY MOUTH DAILY BEFORE BREAKFAST (Patient not taking: Reported on 02/26/2023), Disp: 30 tablet, Rfl: 0   senna (SENOKOT) 8.6 MG tablet, Take by  mouth., Disp: , Rfl:    SENNA PO, Take 1 capsule by mouth in the morning, at noon, in the evening, and at bedtime., Disp: , Rfl:    SYMBICORT 80-4.5 MCG/ACT inhaler, INHALE 2 PUFFS INTO LUNGS TWICE DAILY, Disp: 10.2 g, Rfl: 0   terazosin (HYTRIN) 5 MG capsule, TAKE 1 CAPSULE BY MOUTH DAILY. TAKE IN PLACE OF DOXAZOSIN., Disp: 90 capsule, Rfl: 4   VITAMIN D, CHOLECALCIFEROL, PO, Take by mouth., Disp: , Rfl:    cloNIDine (CATAPRES) 0.2 MG tablet, TAKE 1 TABLET BY MOUTH TWICE DAILY, Disp: 180 tablet, Rfl: 0   glucose blood (ONETOUCH ULTRA) test strip, USE AS DIRECTED THREE TIMES A DAY, Disp: 100 each, Rfl: 3   Insulin Glargine (BASAGLAR KWIKPEN) 100 UNIT/ML, DIAL AND INJECT 21 UNITS UNDER THE SKIN DAILY. MAX DAILY DOSE IS 21 UNITS., Disp: 30 mL, Rfl: 0   rosuvastatin (CRESTOR) 20 MG tablet, TAKE ONE TABLET BY MOUTH DAILY, Disp: 90 tablet, Rfl: 3  Social History   Tobacco Use  Smoking Status Former   Current packs/day: 0.00   Average packs/day: 1 pack/day for 25.0 years (25.0 ttl pk-yrs)   Types: Cigarettes   Start date: 05/30/1963   Quit date: 05/29/1988   Years since quitting: 34.7  Smokeless Tobacco Current   Types: Chew  Tobacco Comments   chews 1 bag of loose leaf chew/week    Allergies  Allergen Reactions   Chlorthalidone Other (See Comments)    D/c by dr. [name] for worsening renal function 03/2019   Spironolactone Other (See Comments)    gynecomastia   Objective:   Vitals:   02/20/23 1522  BP: (!) 154/75  Pulse: 65   There is no height or weight on file to calculate BMI. Constitutional Well developed. Well nourished.  Vascular Dorsalis pedis pulses palpable bilaterally. Posterior tibial pulses palpable bilaterally. Capillary refill normal to all digits.  No cyanosis or clubbing noted. Pedal hair growth normal.  Neurologic Normal speech. Oriented to person, place, and time. Epicritic sensation to light touch grossly present bilaterally.  Dermatologic Painful  ingrowing nail at lateral nail borders of the hallux nail right. No other open wounds. No skin lesions.  Orthopedic: Normal joint ROM without pain or crepitus bilaterally. No visible deformities. No bony tenderness.   Radiographs: None Assessment:  No diagnosis found. Plan:  Patient was evaluated and treated and all questions answered.  Ingrown Nail, right -Patient elects to proceed with minor surgery to remove ingrown toenail removal today. Consent reviewed and signed by patient. -Ingrown  nail excised. See procedure note. -Educated on post-procedure care including soaking. Written instructions provided and reviewed. -Patient to follow up in 2 weeks for nail check.  Procedure: Excision of Ingrown Toenail Location: Right 1st toe lateral nail borders. Anesthesia: Lidocaine 1% plain; 1.5 mL and Marcaine 0.5% plain; 1.5 mL, digital block. Skin Prep: Betadine. Dressing: Silvadene; telfa; dry, sterile, compression dressing. Technique: Following skin prep, the toe was exsanguinated and a tourniquet was secured at the base of the toe. The affected nail border was freed, split with a nail splitter, and excised. Chemical matrixectomy was then performed with phenol and irrigated out with alcohol. The tourniquet was then removed and sterile dressing applied. Disposition: Patient tolerated procedure well. Patient to return in 2 weeks for follow-up.   No follow-ups on file.

## 2023-02-21 ENCOUNTER — Other Ambulatory Visit: Payer: Self-pay

## 2023-02-21 NOTE — Patient Outreach (Signed)
Care Management   Visit Note  02/21/2023 Name: Jacob Herrera MRN: [id number] DOB: 1950-08-20  Subjective: Cheyne Kraemer is a 72 y.o. year old male who is a primary care patient of [name], Demetrios Isaacs, MD. The Care Management team was consulted for assistance.      Engaged with patient via telephone.  Call received form Mr. Dubicki. Reports receiving a text message regarding upcoming appointments. Expressed concerns that he agreed to a telephone visit with the nurse manager on 02/26/23. Reports receiving a text message from the automated system indicating his appointment was scheduled for 02/28/23.  Reviewed pending appointments. Confirmed with patient that he is scheduled for outreach with RNCM on 02/26/23 at 2pm. Informed him that the text message he received from the automated system was for the appointment on 02/28/23 with the clinic Pharmacist. Patient became very upset stating he did not schedule that appointment . Stated "all of these visits is stressing me out." He was reminded that engagement with the Care Management team if voluntary and he is not obligated to complete these visits. Offered to cancel pending appointments with the Care Management team and dis-enroll him from the program. States he will keep his telephone visit on 02/26/23 as scheduled.    PLAN Will message clinic Pharmacist Will follow up as scheduled on 02/26/23.    Katina [facility]  [facility], [facility] RN Care Manager Direct Dial: [phone] Website: [email or web address]

## 2023-02-22 DIAGNOSIS — G4733 Obstructive sleep apnea (adult) (pediatric): Secondary | ICD-10-CM | POA: Diagnosis not present

## 2023-02-22 DIAGNOSIS — E782 Mixed hyperlipidemia: Secondary | ICD-10-CM | POA: Diagnosis not present

## 2023-02-22 DIAGNOSIS — I5033 Acute on chronic diastolic (congestive) heart failure: Secondary | ICD-10-CM | POA: Diagnosis not present

## 2023-02-22 DIAGNOSIS — I10 Essential (primary) hypertension: Secondary | ICD-10-CM | POA: Diagnosis not present

## 2023-02-22 DIAGNOSIS — I25118 Atherosclerotic heart disease of native coronary artery with other forms of angina pectoris: Secondary | ICD-10-CM | POA: Diagnosis not present

## 2023-02-23 ENCOUNTER — Other Ambulatory Visit: Payer: Self-pay | Admitting: Family Medicine

## 2023-02-23 DIAGNOSIS — M5416 Radiculopathy, lumbar region: Secondary | ICD-10-CM

## 2023-02-25 ENCOUNTER — Other Ambulatory Visit: Payer: Self-pay | Admitting: Family Medicine

## 2023-02-25 DIAGNOSIS — G4733 Obstructive sleep apnea (adult) (pediatric): Secondary | ICD-10-CM | POA: Diagnosis not present

## 2023-02-25 DIAGNOSIS — E1121 Type 2 diabetes mellitus with diabetic nephropathy: Secondary | ICD-10-CM

## 2023-02-26 ENCOUNTER — Other Ambulatory Visit: Payer: Self-pay

## 2023-02-26 DIAGNOSIS — E1122 Type 2 diabetes mellitus with diabetic chronic kidney disease: Secondary | ICD-10-CM | POA: Diagnosis not present

## 2023-02-26 DIAGNOSIS — N1832 Chronic kidney disease, stage 3b: Secondary | ICD-10-CM | POA: Diagnosis not present

## 2023-02-26 DIAGNOSIS — I10 Essential (primary) hypertension: Secondary | ICD-10-CM | POA: Diagnosis not present

## 2023-02-26 DIAGNOSIS — R600 Localized edema: Secondary | ICD-10-CM | POA: Diagnosis not present

## 2023-02-26 DIAGNOSIS — N2581 Secondary hyperparathyroidism of renal origin: Secondary | ICD-10-CM | POA: Diagnosis not present

## 2023-02-26 DIAGNOSIS — R809 Proteinuria, unspecified: Secondary | ICD-10-CM | POA: Diagnosis not present

## 2023-02-26 NOTE — Patient Instructions (Addendum)
Thank you for allowing the Care Management team to participate in your care. It was great speaking with you.  Reminders: A message will be forwarded to Dr. Sherrie Mustache regarding your glucometer supplies. Please continue to recheck your blood sugars with the glucometer if you are out of sensors for your continuous monitoring device or to verify accuracy.  Our next outreach is scheduled for April 09, 2023 at 2 pm. Please do not hesitate to call if you require assistance prior to your next outreach .

## 2023-02-26 NOTE — Patient Outreach (Signed)
Care Management   Visit Note  02/26/2023 Name: Jacob Herrera MRN: [id number] DOB: Jun 30, 1950  Subjective: Jacob Herrera is a 72 y.o. year old male who is a primary care patient of [name], Demetrios Isaacs, MD. The Care Management team was consulted for assistance.      Engaged with patient via telephone  Assessment:  Outpatient Encounter Medications as of 02/26/2023  Medication Sig Note   acetaminophen (TYLENOL) 500 MG tablet Take 500 mg by mouth every 6 (six) hours as needed.    albuterol (PROVENTIL) (2.5 MG/3ML) 0.083% nebulizer solution Take 3 mLs (2.5 mg total) by nebulization every 4 (four) hours. And PRN (Patient taking differently: Take 2.5 mg by nebulization as needed.) 02/26/2023: Reports using as needed   amLODipine (NORVASC) 10 MG tablet Take 1 tablet (10 mg total) by mouth daily.    aspirin 81 MG tablet Take 81 mg by mouth daily.    carvedilol (COREG) 25 MG tablet TAKE 1 TABLET BY MOUTH TWICE DAILY    cetirizine (ZYRTEC) 10 MG tablet Take 10 mg by mouth daily.    cloNIDine (CATAPRES) 0.2 MG tablet TAKE 1 TABLET BY MOUTH TWICE DAILY    Cyanocobalamin (VITAMIN B 12 PO) Take 1,000 mcg by mouth daily.     dapagliflozin propanediol ([name]) 10 MG TABS tablet Take 10 mg by mouth daily.    Dulaglutide (TRULICITY) 1.5 MG/0.5ML SOPN Inject 1.5 mg into the skin once a week. Via [employer] patient assistance through Dec 2023    erythromycin ophthalmic ointment Apply 1 a small amount into right eye every night    finasteride (PROSCAR) 5 MG tablet Take 1 tablet (5 mg total) by mouth daily.    gabapentin (NEURONTIN) 600 MG tablet TAKE 1 TABLET BY MOUTH 2 TIMES DAILY    hydrALAZINE (APRESOLINE) 100 MG tablet TAKE 1 TABLET BY MOUTH THREE TIMES DAILY    Insulin Glargine (BASAGLAR KWIKPEN) 100 UNIT/ML DIAL AND INJECT 21 UNITS UNDER THE SKIN DAILY. MAX DAILY DOSE IS 21 UNITS. 02/26/2023: Reports dose was increased to 25 units/day   insulin lispro (HUMALOG KWIKPEN) 100 UNIT/ML KwikPen Inject 6-10  Units into the skin 3 (three) times daily. Sliding scale. Via [employer] patient assistance through Dec 2023    isosorbide mononitrate (IMDUR) 60 MG 24 hr tablet TAKE 1 TABLET BY MOUTH DAILY    levothyroxine (SYNTHROID) 125 MCG tablet TAKE 1 TABLET EVERY DAY ON EMPTY STOMACHWITH A GLASS OF WATER AT LEAST 30-60 MINBEFORE BREAKFAST    losartan (COZAAR) 100 MG tablet Take 1 tablet (100 mg total) by mouth daily.    rosuvastatin (CRESTOR) 20 MG tablet Take 1 tablet (20 mg total) by mouth daily. (TAKE IN PLACE OF ATORVASTATIN AND CANCEL ATORVASTATIN REFILLS)    SENNA PO Take 1 capsule by mouth in the morning, at noon, in the evening, and at bedtime.    SYMBICORT 80-4.5 MCG/ACT inhaler INHALE 2 PUFFS INTO LUNGS TWICE DAILY 02/26/2023: Reports only using as needed   terazosin (HYTRIN) 5 MG capsule TAKE 1 CAPSULE BY MOUTH DAILY. TAKE IN PLACE OF DOXAZOSIN.    VITAMIN D, CHOLECALCIFEROL, PO Take by mouth.    Continuous Blood Gluc Sensor (FREESTYLE LIBRE 3 SENSOR) MISC Place sensor on the arm every 14 days and use to monitor blood sugars continuously as directed.    furosemide (LASIX) 40 MG tablet Take 40 mg by mouth 2 (two) times daily. 2tab AM and 1tab PM. (Patient not taking: Reported on 02/26/2023) 02/26/2023: Reports medication was discontinued by  Cardiologist   Insulin Pen Needle (BD PEN NEEDLE NANO U/F) 32G X 4 MM MISC USE EVERY DAY AS DIRECTED    Lysine 500 MG CAPS Take 1 capsule by mouth daily.    ONETOUCH ULTRA test strip USE AS DIRECTED THREE TIMES A DAY    pantoprazole (PROTONIX) 40 MG tablet TAKE ONE TABLET BY MOUTH DAILY BEFORE BREAKFAST (Patient not taking: Reported on 02/26/2023) 02/26/2023: Reports only using as needed   senna (SENOKOT) 8.6 MG tablet Take by mouth.    No facility-administered encounter medications on file as of 02/26/2023.

## 2023-02-27 ENCOUNTER — Ambulatory Visit
Admission: RE | Admit: 2023-02-27 | Discharge: 2023-02-27 | Disposition: A | Discharge status: Home / Self Care | Payer: PPO | Source: Ambulatory Visit | Attending: Family Medicine | Admitting: Family Medicine

## 2023-02-27 DIAGNOSIS — M5416 Radiculopathy, lumbar region: Secondary | ICD-10-CM

## 2023-02-28 ENCOUNTER — Other Ambulatory Visit: Payer: Self-pay | Admitting: Pharmacist

## 2023-02-28 ENCOUNTER — Other Ambulatory Visit: Payer: Self-pay | Admitting: Family Medicine

## 2023-02-28 ENCOUNTER — Other Ambulatory Visit: Payer: PPO | Admitting: Pharmacist

## 2023-02-28 MED ORDER — ONETOUCH ULTRA VI STRP: ORAL_STRIP | 3 refills | Status: AC

## 2023-02-28 NOTE — Progress Notes (Signed)
   02/28/2023  Patient ID: Jacob Herrera, male   DOB: Aug 01, 1950, 72 y.o.   MRN: [id number]  Met with the patient today to complete [facility] 2025 renewal application for Trulicity, Humalog, and Basaglar. Reports completely the email link re-enrollment process for [place] already.   Patient provided social security benefit statement for 2024. Online application submitted and notification sent to PCP to complete. PCP advised that account is not functioning properly and upon review, cannot submit any applications at this time- will try again tomorrow.   Marlowe Aschoff, PharmD [facility] Phone Number: [phone]

## 2023-02-28 NOTE — Progress Notes (Signed)
Order for test strips Received: Today Jacob Fairly, RN  Malva Limes, MD Good Afternoon Dr. Sherrie Mustache  Mr. Harkless needs a new order for test strips. He currently uses his glucometer to double check abnormal readings on his [place] device.  Thank You

## 2023-03-01 DIAGNOSIS — C44519 Basal cell carcinoma of skin of other part of trunk: Secondary | ICD-10-CM | POA: Diagnosis not present

## 2023-03-01 DIAGNOSIS — D045 Carcinoma in situ of skin of trunk: Secondary | ICD-10-CM | POA: Diagnosis not present

## 2023-03-01 DIAGNOSIS — C44619 Basal cell carcinoma of skin of left upper limb, including shoulder: Secondary | ICD-10-CM | POA: Diagnosis not present

## 2023-03-01 DIAGNOSIS — C44612 Basal cell carcinoma of skin of right upper limb, including shoulder: Secondary | ICD-10-CM | POA: Diagnosis not present

## 2023-03-05 ENCOUNTER — Telehealth: Payer: Self-pay | Admitting: Pharmacist

## 2023-03-05 DIAGNOSIS — E1121 Type 2 diabetes mellitus with diabetic nephropathy: Secondary | ICD-10-CM

## 2023-03-05 NOTE — Progress Notes (Signed)
   03/05/2023  Patient ID: Signe Colt, male   DOB: 1950-07-15, 72 y.o.   MRN: [id number]  Patient called and left a voicemail stating he keeps receiving emails to complete the rest of the [employer] application. He was worried that he was getting scam emails.   [employer] and they report the application is submitted, and that he just needs to provide consent.  Tried to walk the patient through steps to create an account with a new password, but was unsuccessful in attempting to provide consent electronically. Also unable to get him to forward the email to assist.  Advised I will mail out the application at this time and he should expect it in the mail in 1 week. Needs to bring to 03/19/23 PCP appointment to have it signed and faxed to [facility] appropriately.   Medications have been sent to [place] for [employer] and [employer] for Marcelline Deist to avoid signing paper prescriptions for renewal. PCP will still need to sign the [employer] application and fax to company for renewal though.    Marlowe Aschoff, PharmD [facility] Phone Number: [phone]

## 2023-03-06 ENCOUNTER — Other Ambulatory Visit: Payer: Self-pay | Admitting: Family Medicine

## 2023-03-06 DIAGNOSIS — I1A0 Resistant hypertension: Secondary | ICD-10-CM

## 2023-03-06 DIAGNOSIS — I25110 Atherosclerotic heart disease of native coronary artery with unstable angina pectoris: Secondary | ICD-10-CM

## 2023-03-06 DIAGNOSIS — E785 Hyperlipidemia, unspecified: Secondary | ICD-10-CM

## 2023-03-07 ENCOUNTER — Other Ambulatory Visit: Payer: Self-pay | Admitting: Family Medicine

## 2023-03-07 ENCOUNTER — Telehealth: Payer: Self-pay

## 2023-03-07 DIAGNOSIS — E1121 Type 2 diabetes mellitus with diabetic nephropathy: Secondary | ICD-10-CM

## 2023-03-07 MED ORDER — FREESTYLE LIBRE 3 PLUS SENSOR MISC: 3 refills | Status: DC

## 2023-03-07 NOTE — Telephone Encounter (Signed)
Copied from CRM [id number]. Topic: General - Other >> Mar 07, 2023  3:10 PM Ja-Kwan M wrote: Reason for CRM: Pt reports that the [employer] is on national back order so he would like to ask if a Rx for [employer] 3+ Sensor could be sent to [employer] - [place], [place] - [id number]  Phone: [phone]  Fax: [phone]

## 2023-03-08 ENCOUNTER — Other Ambulatory Visit: Payer: Self-pay

## 2023-03-08 MED ORDER — INSULIN LISPRO (1 UNIT DIAL) 100 UNIT/ML (KWIKPEN): 6.0000 [IU] | PEN_INJECTOR | Freq: Three times a day (TID) | SUBCUTANEOUS | 3 refills | Status: DC

## 2023-03-08 MED ORDER — FREESTYLE LIBRE 2 READER DEVI: 1.0000 | Freq: Every day | 0 refills | Status: DC

## 2023-03-08 MED ORDER — DAPAGLIFLOZIN PROPANEDIOL 10 MG PO TABS: 10.0000 mg | ORAL_TABLET | Freq: Every day | ORAL | 3 refills | Status: DC

## 2023-03-08 MED ORDER — TRULICITY 1.5 MG/0.5ML ~~LOC~~ SOAJ: 1.5000 mg | SUBCUTANEOUS | 3 refills | Status: DC

## 2023-03-08 MED ORDER — FREESTYLE LIBRE 2 SENSOR MISC: 1.0000 | Freq: Every day | 3 refills | Status: DC

## 2023-03-08 MED ORDER — BASAGLAR KWIKPEN 100 UNIT/ML ~~LOC~~ SOPN: PEN_INJECTOR | SUBCUTANEOUS | 4 refills | Status: DC

## 2023-03-08 NOTE — Addendum Note (Signed)
Addended by: Pollie Friar on: 03/08/2023 11:58 AM   Modules accepted: Orders

## 2023-03-08 NOTE — Telephone Encounter (Signed)
Requested medication (s) are due for refill today:?  Requested medication (s) are on the active medication list:no  Last refill:  07/25/22 1 each  Future visit scheduled: yes  Notes to clinic:  end date 07/25/22    Requested Prescriptions  Pending Prescriptions Disp Refills   Continuous Glucose Receiver (FREESTYLE LIBRE 3 READER) DEVI [Pharmacy Med Name: FREESTYLE LIBRE 3 READER DEVICE] 1 each 0    Sig: USE AS DIRECTED     There is no refill protocol information for this order

## 2023-03-19 ENCOUNTER — Encounter: Payer: Self-pay | Admitting: Family Medicine

## 2023-03-19 ENCOUNTER — Ambulatory Visit: Payer: PPO | Admitting: Family Medicine

## 2023-03-19 VITALS — BP 121/59 | HR 67 | Resp 18 | Ht 68.0 in | Wt 254.0 lb

## 2023-03-19 DIAGNOSIS — Z794 Long term (current) use of insulin: Secondary | ICD-10-CM | POA: Diagnosis not present

## 2023-03-19 DIAGNOSIS — I5032 Chronic diastolic (congestive) heart failure: Secondary | ICD-10-CM

## 2023-03-19 DIAGNOSIS — M5136 Other intervertebral disc degeneration, lumbar region with discogenic back pain only: Secondary | ICD-10-CM

## 2023-03-19 DIAGNOSIS — M545 Low back pain, unspecified: Secondary | ICD-10-CM

## 2023-03-19 DIAGNOSIS — I1A0 Resistant hypertension: Secondary | ICD-10-CM | POA: Diagnosis not present

## 2023-03-19 DIAGNOSIS — N184 Chronic kidney disease, stage 4 (severe): Secondary | ICD-10-CM

## 2023-03-19 DIAGNOSIS — G8929 Other chronic pain: Secondary | ICD-10-CM | POA: Diagnosis not present

## 2023-03-19 DIAGNOSIS — E1121 Type 2 diabetes mellitus with diabetic nephropathy: Secondary | ICD-10-CM | POA: Diagnosis not present

## 2023-03-19 DIAGNOSIS — I25110 Atherosclerotic heart disease of native coronary artery with unstable angina pectoris: Secondary | ICD-10-CM

## 2023-03-19 LAB — POCT GLYCOSYLATED HEMOGLOBIN (HGB A1C)
Est. average glucose Bld gHb Est-mCnc: 137
Hemoglobin A1C: 6.4 % — AB (ref 4.0–5.6)

## 2023-03-19 MED ORDER — AMLODIPINE BESYLATE 5 MG PO TABS: 5.0000 mg | ORAL_TABLET | Freq: Every day | ORAL | 1 refills | Status: DC

## 2023-03-19 NOTE — Patient Instructions (Addendum)
Please review the attached list of medications and notify my office if there are any errors.   Check with your pharmacy benefits to see what the Copay is for [name]. Jacob Herrera is similar to Trulicity but works bette, helps lose weight, and is less like likely to have side effects.

## 2023-03-20 NOTE — Progress Notes (Addendum)
Established patient visit   Patient: Jacob Herrera   DOB: 1950/09/22   72 y.o. Male  MRN: [id number] Visit Date: 03/19/2023  Today's healthcare provider: Mila Merry, MD   Chief Complaint  Patient presents with   Medical Management of Chronic Issues   Hyperlipidemia   Subjective    Discussed the use of AI scribe software for clinical note transcription with the patient, who gave verbal consent to proceed.  History of Present Illness   Mr. Kenna, a patient with a history of diabetes and hypertension, presents with a chief complaint of persistent back pain. The pain is severe enough to affect his ability to stand, walk, and sleep. He also reports a lack of energy. Despite these issues, his blood sugar control appears to be satisfactory, with a recent A1c reading of 6.4.  The patient is currently on a regimen of Basaglar insulin, with a dosage of 45-47 units, and Humalog insulin on a sliding scale. He also takes Trulicity 1.5mg  for which he is enrolled in a patient assistance program. He has not noticed any side effects from Trulicity.  Mr. Kovacevich also has a history of hypertension and is on multiple medications, including clonidine, amlodipine, losartan, terazosin, and hydralazine. He reports that his blood pressure has been running low and his neurologist suggested cutting back on his blood pressure medications. He also mentions that he has been experiencing lightheadedness, which he attributes to the amlodipine.  In addition to his diabetes and hypertension, Mr. Elza is also dealing with fluid retention. He is currently taking Bumetanide and Metolazone, which have helped reduce the swelling in his feet and ankles which is typically worse in the left leg.       Medications: Outpatient Medications Prior to Visit  Medication Sig   acetaminophen (TYLENOL) 500 MG tablet Take 500 mg by mouth every 6 (six) hours as needed.   albuterol (PROVENTIL) (2.5 MG/3ML) 0.083% nebulizer  solution Take 3 mLs (2.5 mg total) by nebulization every 4 (four) hours. And PRN (Patient taking differently: Take 2.5 mg by nebulization as needed.)   aspirin 81 MG tablet Take 81 mg by mouth daily.   carvedilol (COREG) 25 MG tablet TAKE 1 TABLET BY MOUTH TWICE DAILY   cetirizine (ZYRTEC) 10 MG tablet Take 10 mg by mouth daily.   cloNIDine (CATAPRES) 0.2 MG tablet TAKE 1 TABLET BY MOUTH TWICE DAILY   Continuous Blood Gluc Sensor (FREESTYLE LIBRE 3 SENSOR) MISC Place sensor on the arm every 14 days and use to monitor blood sugars continuously as directed.   Continuous Glucose Receiver (FREESTYLE LIBRE 2 READER) DEVI 1 each by Does not apply route daily at 2 PM.   Continuous Glucose Sensor (FREESTYLE LIBRE 3 PLUS SENSOR) MISC Change sensor every 15 days.   Cyanocobalamin (VITAMIN B 12 PO) Take 1,000 mcg by mouth daily.    dapagliflozin propanediol ([name]) 10 MG TABS tablet Take 1 tablet (10 mg total) by mouth daily.   Dulaglutide (TRULICITY) 1.5 MG/0.5ML SOAJ Inject 1.5 mg into the skin once a week.   erythromycin ophthalmic ointment Apply 1 a small amount into right eye every night   finasteride (PROSCAR) 5 MG tablet Take 1 tablet (5 mg total) by mouth daily.   furosemide (LASIX) 40 MG tablet Take 40 mg by mouth 2 (two) times daily. 2tab AM and 1tab PM.   gabapentin (NEURONTIN) 600 MG tablet TAKE 1 TABLET BY MOUTH 2 TIMES DAILY   glucose blood (ONETOUCH ULTRA) test strip  USE AS DIRECTED THREE TIMES A DAY   hydrALAZINE (APRESOLINE) 100 MG tablet TAKE 1 TABLET BY MOUTH THREE TIMES DAILY   Insulin Glargine (BASAGLAR KWIKPEN) 100 UNIT/ML Inject 21-25 units into the skin daily. Adjust as directed by PCP.   insulin lispro (HUMALOG KWIKPEN) 100 UNIT/ML KwikPen Inject 6-10 Units into the skin 3 (three) times daily. Sliding scale.   Insulin Pen Needle (BD PEN NEEDLE NANO U/F) 32G X 4 MM MISC USE EVERY DAY AS DIRECTED   isosorbide mononitrate (IMDUR) 60 MG 24 hr tablet TAKE 1 TABLET BY MOUTH DAILY    levothyroxine (SYNTHROID) 125 MCG tablet TAKE 1 TABLET EVERY DAY ON EMPTY STOMACHWITH A GLASS OF WATER AT LEAST 30-60 MINBEFORE BREAKFAST   losartan (COZAAR) 100 MG tablet Take 1 tablet (100 mg total) by mouth daily.   Lysine 500 MG CAPS Take 1 capsule by mouth daily.   pantoprazole (PROTONIX) 40 MG tablet TAKE ONE TABLET BY MOUTH DAILY BEFORE BREAKFAST   rosuvastatin (CRESTOR) 20 MG tablet TAKE ONE TABLET BY MOUTH DAILY   senna (SENOKOT) 8.6 MG tablet Take by mouth.   SENNA PO Take 1 capsule by mouth in the morning, at noon, in the evening, and at bedtime.   SYMBICORT 80-4.5 MCG/ACT inhaler INHALE 2 PUFFS INTO LUNGS TWICE DAILY   terazosin (HYTRIN) 5 MG capsule TAKE 1 CAPSULE BY MOUTH DAILY. TAKE IN PLACE OF DOXAZOSIN.   VITAMIN D, CHOLECALCIFEROL, PO Take by mouth.   [DISCONTINUED] amLODipine (NORVASC) 10 MG tablet Take 1 tablet (10 mg total) by mouth daily.   Continuous Glucose Sensor (FREESTYLE LIBRE 2 SENSOR) MISC 1 each by Does not apply route daily at 2 PM.   No facility-administered medications prior to visit.   Review of Systems     Objective    BP (!) 121/59 (BP Location: Left Arm, Patient Position: Sitting, Cuff Size: Large)   Pulse 67   Resp 18   Ht 5\' 8"  (1.727 m)   Wt 254 lb (115.2 kg)   SpO2 100%   BMI 38.62 kg/m   Physical Exam   General: Appearance:    Obese male in no acute distress  Eyes:    PERRL, conjunctiva/corneas clear, EOM's intact       Lungs:     Clear to auscultation bilaterally, respirations unlabored  Heart:    Normal heart rate. Normal rhythm.  3/6 systolic murmur at right upper sternal border  3+ bilateral lower leg edema. No calf tenderness.   MS:   All extremities are intact.    Neurologic:   Awake, alert, oriented x 3. No apparent focal neurological defect.             Results for orders placed or performed in visit on 03/19/23  POCT glycosylated hemoglobin (Hb A1C)  Result Value Ref Range   Hemoglobin A1C 6.4 (A) 4.0 - 5.6 %   Est.  average glucose Bld gHb Est-mCnc 137     Assessment & Plan        Chronic Back Pain Reports difficulty with standing, walking, and sleeping due to pain. No specific treatment plan discussed. -Continue current management and monitor.  Type 2 Diabetes Mellitus A1c 6.4, indicating good control. Currently on Basaglar 45-47 units daily, Humalog sliding scale, and Trulicity. He does have occasional issues with constipation on Trulicity. Discussed potential future transition from Trulicity to [facility] which would likely help lose more weight which would likely improved his back pain. He is currently gettering Trulicity through patient assistance,  but will consider change next year if [facility] copay is not cost prohibitive.  -Continue current regimen of Basaglar, Humalog, and Trulicity.  Hypertension Reports low blood pressure readings and lightheadedness, possibly related to amlodipine. Currently on clonidine, amlodipine, losartan, terazosin, and hydralazine. -Reduce amlodipine from 10mg  to 5mg  daily to address lightheadedness and low blood pressure readings.  Edema Reports persistent swelling in feet and ankles despite diuretic therapy (furosemide, bumetanide, metolazone). -Continue current diuretic regimen and monitor, expect some improvement with reduced dose of amlodipine.   Follow-up in March 2025.    Return in about 16 weeks (around 07/09/2023) for Hypertension, Diabetes.      Mila Merry, MD  [facility] [phone] (phone) [phone] (fax)  [facility]

## 2023-03-21 ENCOUNTER — Other Ambulatory Visit: Payer: Self-pay | Admitting: Family Medicine

## 2023-03-21 NOTE — Telephone Encounter (Signed)
Requested Prescriptions  Pending Prescriptions Disp Refills   levothyroxine (SYNTHROID) 125 MCG tablet [Pharmacy Med Name: LEVOTHYROXINE SODIUM 125 MCG TAB] 90 tablet 3    Sig: TAKE 1 TABLET EVERY DAY ON EMPTY STOMACHWITH A GLASS OF WATER AT LEAST 30-60 MINBEFORE BREAKFAST     Endocrinology:  Hypothyroid Agents Passed - 03/21/2023 11:12 AM      Passed - TSH in normal range and within 360 days    TSH  Date Value Ref Range Status  12/18/2022 1.500 0.450 - 4.500 uIU/mL Final         Passed - Valid encounter within last 12 months    Recent Outpatient Visits           2 days ago Diabetes mellitus with nephropathy (HCC)   [facility] Malva Limes, MD   3 months ago Diabetes mellitus with nephropathy [facility])   [place] [facility] Malva Limes, MD   7 months ago Diabetes mellitus with nephropathy [facility])   [facility] Malva Limes, MD   11 months ago Hyperlipidemia, unspecified hyperlipidemia type   [facility] Malva Limes, MD   1 year ago Prostate cancer screening   [facility] Malva Limes, MD       Future Appointments             In 3 months [name], Demetrios Isaacs, MD [facility]   In 8 months Stoioff, Verna Czech, MD [facility] [place]

## 2023-03-26 DIAGNOSIS — M5416 Radiculopathy, lumbar region: Secondary | ICD-10-CM | POA: Diagnosis not present

## 2023-03-26 DIAGNOSIS — M1611 Unilateral primary osteoarthritis, right hip: Secondary | ICD-10-CM | POA: Diagnosis not present

## 2023-03-26 DIAGNOSIS — E782 Mixed hyperlipidemia: Secondary | ICD-10-CM | POA: Diagnosis not present

## 2023-03-26 DIAGNOSIS — M48062 Spinal stenosis, lumbar region with neurogenic claudication: Secondary | ICD-10-CM | POA: Diagnosis not present

## 2023-03-26 DIAGNOSIS — G4733 Obstructive sleep apnea (adult) (pediatric): Secondary | ICD-10-CM | POA: Diagnosis not present

## 2023-03-26 DIAGNOSIS — M419 Scoliosis, unspecified: Secondary | ICD-10-CM | POA: Diagnosis not present

## 2023-03-26 DIAGNOSIS — I25118 Atherosclerotic heart disease of native coronary artery with other forms of angina pectoris: Secondary | ICD-10-CM | POA: Diagnosis not present

## 2023-03-26 DIAGNOSIS — I10 Essential (primary) hypertension: Secondary | ICD-10-CM | POA: Diagnosis not present

## 2023-03-26 DIAGNOSIS — I5033 Acute on chronic diastolic (congestive) heart failure: Secondary | ICD-10-CM | POA: Diagnosis not present

## 2023-03-27 DIAGNOSIS — G4733 Obstructive sleep apnea (adult) (pediatric): Secondary | ICD-10-CM | POA: Diagnosis not present

## 2023-03-27 DIAGNOSIS — J45909 Unspecified asthma, uncomplicated: Secondary | ICD-10-CM | POA: Diagnosis not present

## 2023-04-04 DIAGNOSIS — M1611 Unilateral primary osteoarthritis, right hip: Secondary | ICD-10-CM | POA: Diagnosis not present

## 2023-04-09 ENCOUNTER — Other Ambulatory Visit: Payer: Self-pay

## 2023-04-09 NOTE — Patient Outreach (Signed)
Care Management   Visit Note  04/09/2023 Name: Jacob Herrera MRN: [id number] DOB: 02-04-1951  Subjective: Jacob Herrera is a 72 y.o. year old male who is a primary care patient of [name], Demetrios Isaacs, MD. The Care Management team was consulted for assistance.      Engaged with Jacob Herrera via telephone.  Assessment:  Review of patient past medical history, allergies, medications, health status, including review of consultants reports, laboratory and other test data, was performed as part of  evaluation and provision of care management services.    Outpatient Encounter Medications as of 04/09/2023  Medication Sig Note   senna (SENOKOT) 8.6 MG tablet Take by mouth.    acetaminophen (TYLENOL) 500 MG tablet Take 500 mg by mouth every 6 (six) hours as needed.    albuterol (PROVENTIL) (2.5 MG/3ML) 0.083% nebulizer solution Take 3 mLs (2.5 mg total) by nebulization every 4 (four) hours. And PRN (Patient taking differently: Take 2.5 mg by nebulization as needed.) 02/26/2023: Reports using as needed   amLODipine (NORVASC) 5 MG tablet Take 1 tablet (5 mg total) by mouth daily.    aspirin 81 MG tablet Take 81 mg by mouth daily.    carvedilol (COREG) 25 MG tablet TAKE 1 TABLET BY MOUTH TWICE DAILY    cetirizine (ZYRTEC) 10 MG tablet Take 10 mg by mouth daily.    cloNIDine (CATAPRES) 0.2 MG tablet TAKE 1 TABLET BY MOUTH TWICE DAILY    Continuous Blood Gluc Sensor (FREESTYLE LIBRE 3 SENSOR) MISC Place sensor on the arm every 14 days and use to monitor blood sugars continuously as directed.    Continuous Glucose Receiver (FREESTYLE LIBRE 2 READER) DEVI 1 each by Does not apply route daily at 2 PM.    Continuous Glucose Sensor (FREESTYLE LIBRE 2 SENSOR) MISC 1 each by Does not apply route daily at 2 PM.    Continuous Glucose Sensor (FREESTYLE LIBRE 3 PLUS SENSOR) MISC Change sensor every 15 days.    Cyanocobalamin (VITAMIN B 12 PO) Take 1,000 mcg by mouth daily.     dapagliflozin propanediol  ([name]) 10 MG TABS tablet Take 1 tablet (10 mg total) by mouth daily.    Dulaglutide (TRULICITY) 1.5 MG/0.5ML SOAJ Inject 1.5 mg into the skin once a week.    erythromycin ophthalmic ointment Apply 1 a small amount into right eye every night    finasteride (PROSCAR) 5 MG tablet Take 1 tablet (5 mg total) by mouth daily.    furosemide (LASIX) 40 MG tablet Take 40 mg by mouth 2 (two) times daily. 2tab AM and 1tab PM. 02/26/2023: Reports medication was discontinued by Cardiologist   gabapentin (NEURONTIN) 600 MG tablet TAKE 1 TABLET BY MOUTH 2 TIMES DAILY    glucose blood (ONETOUCH ULTRA) test strip USE AS DIRECTED THREE TIMES A DAY    hydrALAZINE (APRESOLINE) 100 MG tablet TAKE 1 TABLET BY MOUTH THREE TIMES DAILY    Insulin Glargine (BASAGLAR KWIKPEN) 100 UNIT/ML Inject 21-25 units into the skin daily. Adjust as directed by PCP.    insulin lispro (HUMALOG KWIKPEN) 100 UNIT/ML KwikPen Inject 6-10 Units into the skin 3 (three) times daily. Sliding scale.    Insulin Pen Needle (BD PEN NEEDLE NANO U/F) 32G X 4 MM MISC USE EVERY DAY AS DIRECTED    isosorbide mononitrate (IMDUR) 60 MG 24 hr tablet TAKE 1 TABLET BY MOUTH DAILY    levothyroxine (SYNTHROID) 125 MCG tablet TAKE 1 TABLET EVERY DAY ON EMPTY STOMACHWITH A GLASS OF WATER  AT LEAST 30-60 MINBEFORE BREAKFAST    losartan (COZAAR) 100 MG tablet Take 1 tablet (100 mg total) by mouth daily.    Lysine 500 MG CAPS Take 1 capsule by mouth daily.    pantoprazole (PROTONIX) 40 MG tablet TAKE ONE TABLET BY MOUTH DAILY BEFORE BREAKFAST 02/26/2023: Reports only using as needed   rosuvastatin (CRESTOR) 20 MG tablet TAKE ONE TABLET BY MOUTH DAILY    SENNA PO Take 1 capsule by mouth in the morning, at noon, in the evening, and at bedtime.    SYMBICORT 80-4.5 MCG/ACT inhaler INHALE 2 PUFFS INTO LUNGS TWICE DAILY 02/26/2023: Reports only using as needed   terazosin (HYTRIN) 5 MG capsule TAKE 1 CAPSULE BY MOUTH DAILY. TAKE IN PLACE OF DOXAZOSIN.    VITAMIN D,  CHOLECALCIFEROL, PO Take by mouth.    No facility-administered encounter medications on file as of 04/09/2023.

## 2023-04-19 ENCOUNTER — Other Ambulatory Visit: Payer: Self-pay | Admitting: Family Medicine

## 2023-04-19 DIAGNOSIS — I1A0 Resistant hypertension: Secondary | ICD-10-CM

## 2023-04-20 ENCOUNTER — Ambulatory Visit: Payer: Self-pay

## 2023-04-20 NOTE — Telephone Encounter (Addendum)
  Chief Complaint: first BP in the am systolic: 90-96  and diastolic 58/61 Symptoms: am dizziness but subsides as the day goes on and his BP improves Frequency: ongoing Pertinent Negatives: Patient denies present dizziness Disposition: [] ED /[] Urgent Care (no appt availability in office) / [x] Appointment(In office/virtual)/ []  [place] [facility]/ [] Home Care/ [] Refused Recommended Disposition /[] [place] [facility]/ []  Follow-up with PCP Additional Notes: Made in person appt for Monday.  Of note:  Pt taking 2 medication that are not on his active med list. He is on: Metolazone 5 mg daily and Bumetanide 2 mg twice daily.   Pt has appt Monday 04/23/23 to discuss these and his other antihyoertensive medications due to low BP reading in am.      Reason for Disposition  [1] Systolic BP 90-110 AND [2] taking blood pressure medications AND [3] NOT dizzy, lightheaded or weak  Answer Assessment - Initial Assessment Questions 1. BLOOD PRESSURE: "What is the blood pressure?" "Did you take at least two measurements 5 minutes apart?" Today:     1157:130/59   0933 118/55  120/56   96/61 before any meds  Yest 1100 117/61 12/24 1029  117/62 0900 90/58 2. ONSET: "When did you take your blood pressure?"     Every day  3. HOW: "How did you obtain the blood pressure?" (e.g., visiting nurse, automatic home BP monitor)     Auto home monitor 4. HISTORY: "Do you have a history of low blood pressure?" "What is your blood pressure normally?"     Just  5. MEDICINES: "Are you taking any medications for blood pressure?" If Yes, ask: "Have they been changed recently?"     yes  7. OTHER SYMPTOMS: "Have you been sick recently?" "Have you had a recent injury?"     Gets dizzy in the am when his BP is low then as the morning goes on the dizziness subsides and his BP normalizes     N/a  Protocols used: Blood Pressure - Low-A-AH

## 2023-04-23 ENCOUNTER — Ambulatory Visit (INDEPENDENT_AMBULATORY_CARE_PROVIDER_SITE_OTHER): Payer: PPO | Admitting: Family Medicine

## 2023-04-23 VITALS — BP 108/62 | HR 70 | Ht 68.0 in | Wt 244.0 lb

## 2023-04-23 DIAGNOSIS — I5032 Chronic diastolic (congestive) heart failure: Secondary | ICD-10-CM

## 2023-04-23 DIAGNOSIS — I959 Hypotension, unspecified: Secondary | ICD-10-CM | POA: Diagnosis not present

## 2023-04-23 DIAGNOSIS — I1A0 Resistant hypertension: Secondary | ICD-10-CM | POA: Diagnosis not present

## 2023-04-23 MED ORDER — CLONIDINE HCL 0.1 MG PO TABS: 0.1000 mg | ORAL_TABLET | Freq: Two times a day (BID) | ORAL | 1 refills | Status: DC

## 2023-04-23 NOTE — Progress Notes (Signed)
Established patient visit   Patient: Jacob Herrera   DOB: 1951/04/16   72 y.o. Male  MRN: [id number] Visit Date: 04/23/2023  Today's healthcare provider: Mila Merry, MD   No chief complaint on file.  Subjective    HPI HPI   Pt stated---lowest reader: b/p--90/60, late morning after breakfast--have dizziness., light head. Last edited by Shelly Bombard, CMA on 04/23/2023  2:59 PM.      Was seen in November and had amlodipine reduced from 10mg  to 5mg . Low BP persistent for a few weeks, but has started to come back over the last several days, but still gets light headed. He also reports that his cardiologist changed furosemide to metolazone and bumetanide a few months ago, and increased the dose of metolazone to 5mg  about a month ago. Since then his leg swelling has gotten a lot better.   Medications: Outpatient Medications Prior to Visit  Medication Sig   acetaminophen (TYLENOL) 500 MG tablet Take 500 mg by mouth every 6 (six) hours as needed.   albuterol (PROVENTIL) (2.5 MG/3ML) 0.083% nebulizer solution Take 3 mLs (2.5 mg total) by nebulization every 4 (four) hours. And PRN (Patient taking differently: Take 2.5 mg by nebulization as needed.)   amLODipine (NORVASC) 5 MG tablet Take 1 tablet (5 mg total) by mouth daily.   aspirin 81 MG tablet Take 81 mg by mouth daily.   bumetanide (BUMEX) 2 MG tablet Take 2 mg by mouth 2 (two) times daily.   carvedilol (COREG) 25 MG tablet TAKE 1 TABLET BY MOUTH TWICE DAILY   cetirizine (ZYRTEC) 10 MG tablet Take 10 mg by mouth daily.   cloNIDine (CATAPRES) 0.2 MG tablet TAKE 1 TABLET BY MOUTH TWICE DAILY   Continuous Blood Gluc Sensor (FREESTYLE LIBRE 3 SENSOR) MISC Place sensor on the arm every 14 days and use to monitor blood sugars continuously as directed.   Continuous Glucose Sensor (FREESTYLE LIBRE 3 PLUS SENSOR) MISC Change sensor every 15 days.   Cyanocobalamin (VITAMIN B 12 PO) Take 1,000 mcg by mouth daily.    dapagliflozin  propanediol ([name]) 10 MG TABS tablet Take 1 tablet (10 mg total) by mouth daily.   Dulaglutide (TRULICITY) 1.5 MG/0.5ML SOAJ Inject 1.5 mg into the skin once a week.   erythromycin ophthalmic ointment Apply 1 a small amount into right eye every night   finasteride (PROSCAR) 5 MG tablet Take 1 tablet (5 mg total) by mouth daily.   gabapentin (NEURONTIN) 600 MG tablet TAKE 1 TABLET BY MOUTH 2 TIMES DAILY   glucose blood (ONETOUCH ULTRA) test strip USE AS DIRECTED THREE TIMES A DAY   hydrALAZINE (APRESOLINE) 100 MG tablet TAKE 1 TABLET BY MOUTH THREE TIMES DAILY   Insulin Glargine (BASAGLAR KWIKPEN) 100 UNIT/ML Inject 21-25 units into the skin daily. Adjust as directed by PCP.   insulin lispro (HUMALOG KWIKPEN) 100 UNIT/ML KwikPen Inject 6-10 Units into the skin 3 (three) times daily. Sliding scale.   Insulin Pen Needle (BD PEN NEEDLE NANO U/F) 32G X 4 MM MISC USE EVERY DAY AS DIRECTED   isosorbide mononitrate (IMDUR) 60 MG 24 hr tablet TAKE 1 TABLET BY MOUTH DAILY   levothyroxine (SYNTHROID) 125 MCG tablet TAKE 1 TABLET EVERY DAY ON EMPTY STOMACHWITH A GLASS OF WATER AT LEAST 30-60 MINBEFORE BREAKFAST   losartan (COZAAR) 100 MG tablet Take 1 tablet (100 mg total) by mouth daily.   Lysine 500 MG CAPS Take 1 capsule by mouth daily.   metolazone (  ZAROXOLYN) 5 MG tablet Take 5 mg by mouth daily.   pantoprazole (PROTONIX) 40 MG tablet TAKE ONE TABLET BY MOUTH DAILY BEFORE BREAKFAST   rosuvastatin (CRESTOR) 20 MG tablet TAKE ONE TABLET BY MOUTH DAILY   senna (SENOKOT) 8.6 MG tablet Take by mouth.   SENNA PO Take 1 capsule by mouth in the morning, at noon, in the evening, and at bedtime.   SYMBICORT 80-4.5 MCG/ACT inhaler INHALE 2 PUFFS INTO LUNGS TWICE DAILY   terazosin (HYTRIN) 5 MG capsule TAKE 1 CAPSULE BY MOUTH DAILY. TAKE IN PLACE OF DOXAZOSIN.   VITAMIN D, CHOLECALCIFEROL, PO Take by mouth.   furosemide (LASIX) 40 MG tablet Take 40 mg by mouth 2 (two) times daily. 2tab AM and 1tab PM. (Patient  not taking: Reported on 04/23/2023)   [DISCONTINUED] Continuous Glucose Receiver (FREESTYLE LIBRE 2 READER) DEVI 1 each by Does not apply route daily at 2 PM.   [DISCONTINUED] Continuous Glucose Sensor (FREESTYLE LIBRE 2 SENSOR) MISC 1 each by Does not apply route daily at 2 PM.   No facility-administered medications prior to visit.      Objective    BP 108/62 (BP Location: Left Arm, Patient Position: Sitting, Cuff Size: Large)   Pulse 70   Ht 5\' 8"  (1.727 m)   Wt 244 lb (110.7 kg)   BMI 37.10 kg/m    Physical Exam  General appearance: Mildly obese male, cooperative and in no acute distress Head: Normocephalic, without obvious abnormality, atraumatic Respiratory: Respirations even and unlabored, normal respiratory rate Extremities: All extremities are intact.  Skin: Skin color, texture, turgor normal. No rashes seen  Psych: Appropriate mood and affect. Neurologic: Mental status: Alert, oriented to person, place, and time, thought content appropriate.   Assessment & Plan     1. Hypotension, unspecified hypotension type (Primary) Complicated by polypharmacy for treatment of CKD and diastolic HF. 1 month on lower dose of amlodipine and BP starting to come up a bit over the last week.   2. Resistant hypertension Will reduce cloNIDine (CATAPRES) from 0.2mg  BID to 0.1 MG tablet; Take 1 tablet (0.1 mg total) by mouth 2 (two) times daily.  Dispense: 60 tablet; Refill: 1  Has follow up appointment with nephrology in January and will check BP then. Consider stopping clonidine if remains hypotensive  3. Chronic diastolic heart failure (HCC) Edema much better on bumetanide (BUMEX) 2 MG tablet; Take 2 mg by mouth 2 (two) times daily.and metolazone (ZAROXOLYN) 5 MG tablet; Take 5 mg by mouth daily. Prescribed by cardiology. Follow up cardiology in February as scheduled.   Future Appointments  Date Time Provider Department Center  05/21/2023  1:00 PM Juanell Fairly, RN [facility] None   07/09/2023  2:00 PM Malva Limes, MD [facility]  11/19/2023  1:30 PM Lonna Cobb, Verna Czech, MD [facility] None           Mila Merry, MD  [facility] [phone] (phone) [phone] (fax)  [facility]

## 2023-04-23 NOTE — Patient Instructions (Signed)
.   Please review the attached list of medications and notify my office if there are any errors.   . Please bring all of your medications to every appointment so we can make sure that our medication list is the same as yours.   

## 2023-04-27 DIAGNOSIS — G4733 Obstructive sleep apnea (adult) (pediatric): Secondary | ICD-10-CM | POA: Diagnosis not present

## 2023-04-27 NOTE — Telephone Encounter (Signed)
 Pt seen on 04/23/23

## 2023-04-30 DIAGNOSIS — N2581 Secondary hyperparathyroidism of renal origin: Secondary | ICD-10-CM | POA: Diagnosis not present

## 2023-04-30 DIAGNOSIS — N1832 Chronic kidney disease, stage 3b: Secondary | ICD-10-CM | POA: Diagnosis not present

## 2023-04-30 DIAGNOSIS — R809 Proteinuria, unspecified: Secondary | ICD-10-CM | POA: Diagnosis not present

## 2023-04-30 DIAGNOSIS — M1611 Unilateral primary osteoarthritis, right hip: Secondary | ICD-10-CM | POA: Diagnosis not present

## 2023-04-30 DIAGNOSIS — M5416 Radiculopathy, lumbar region: Secondary | ICD-10-CM | POA: Diagnosis not present

## 2023-04-30 DIAGNOSIS — D631 Anemia in chronic kidney disease: Secondary | ICD-10-CM | POA: Diagnosis not present

## 2023-04-30 DIAGNOSIS — M48062 Spinal stenosis, lumbar region with neurogenic claudication: Secondary | ICD-10-CM | POA: Diagnosis not present

## 2023-04-30 DIAGNOSIS — E790 Hyperuricemia without signs of inflammatory arthritis and tophaceous disease: Secondary | ICD-10-CM | POA: Diagnosis not present

## 2023-04-30 DIAGNOSIS — E1122 Type 2 diabetes mellitus with diabetic chronic kidney disease: Secondary | ICD-10-CM | POA: Diagnosis not present

## 2023-04-30 DIAGNOSIS — M419 Scoliosis, unspecified: Secondary | ICD-10-CM | POA: Diagnosis not present

## 2023-05-01 DIAGNOSIS — M5416 Radiculopathy, lumbar region: Secondary | ICD-10-CM | POA: Diagnosis not present

## 2023-05-01 DIAGNOSIS — M48062 Spinal stenosis, lumbar region with neurogenic claudication: Secondary | ICD-10-CM | POA: Diagnosis not present

## 2023-05-07 DIAGNOSIS — N1832 Chronic kidney disease, stage 3b: Secondary | ICD-10-CM | POA: Diagnosis not present

## 2023-05-07 DIAGNOSIS — E790 Hyperuricemia without signs of inflammatory arthritis and tophaceous disease: Secondary | ICD-10-CM | POA: Diagnosis not present

## 2023-05-07 DIAGNOSIS — D631 Anemia in chronic kidney disease: Secondary | ICD-10-CM | POA: Diagnosis not present

## 2023-05-07 DIAGNOSIS — N2581 Secondary hyperparathyroidism of renal origin: Secondary | ICD-10-CM | POA: Diagnosis not present

## 2023-05-07 DIAGNOSIS — R809 Proteinuria, unspecified: Secondary | ICD-10-CM | POA: Diagnosis not present

## 2023-05-07 DIAGNOSIS — I10 Essential (primary) hypertension: Secondary | ICD-10-CM | POA: Diagnosis not present

## 2023-05-07 DIAGNOSIS — E1122 Type 2 diabetes mellitus with diabetic chronic kidney disease: Secondary | ICD-10-CM | POA: Diagnosis not present

## 2023-05-07 DIAGNOSIS — R600 Localized edema: Secondary | ICD-10-CM | POA: Diagnosis not present

## 2023-05-16 DIAGNOSIS — M1612 Unilateral primary osteoarthritis, left hip: Secondary | ICD-10-CM | POA: Diagnosis not present

## 2023-05-21 ENCOUNTER — Other Ambulatory Visit: Payer: Self-pay

## 2023-05-21 NOTE — Patient Outreach (Signed)
Care Management   Visit Note  05/21/2023 Name: Jacob Herrera MRN: [id number] DOB: 06-09-1950  Subjective: Jacob Herrera is a 73 y.o. year old male who is a primary care patient of [name], Jacob Isaacs, MD. The Care Management team was consulted for assistance.      Engaged with Mr. Pumphrey via telephone.  Assessment:  Review of patient past medical history, allergies, medications, health status, including review of consultants reports, laboratory and other test data, was performed as part of  evaluation and provision of care management services.   Outpatient Encounter Medications as of 05/21/2023  Medication Sig Note   acetaminophen (TYLENOL) 500 MG tablet Take 500 mg by mouth every 6 (six) hours as needed.    albuterol (PROVENTIL) (2.5 MG/3ML) 0.083% nebulizer solution Take 3 mLs (2.5 mg total) by nebulization every 4 (four) hours. And PRN (Patient taking differently: Take 2.5 mg by nebulization as needed.) 02/26/2023: Reports using as needed   amLODipine (NORVASC) 5 MG tablet Take 1 tablet (5 mg total) by mouth daily.    aspirin 81 MG tablet Take 81 mg by mouth daily.    bumetanide (BUMEX) 2 MG tablet Take 2 mg by mouth 2 (two) times daily.    carvedilol (COREG) 25 MG tablet TAKE 1 TABLET BY MOUTH TWICE DAILY    cetirizine (ZYRTEC) 10 MG tablet Take 10 mg by mouth daily.    cloNIDine (CATAPRES) 0.1 MG tablet Take 1 tablet (0.1 mg total) by mouth 2 (two) times daily.    Continuous Blood Gluc Sensor (FREESTYLE LIBRE 3 SENSOR) MISC Place sensor on the arm every 14 days and use to monitor blood sugars continuously as directed.    Continuous Glucose Sensor (FREESTYLE LIBRE 3 PLUS SENSOR) MISC Change sensor every 15 days.    Cyanocobalamin (VITAMIN B 12 PO) Take 1,000 mcg by mouth daily.     dapagliflozin propanediol ([name]) 10 MG TABS tablet Take 1 tablet (10 mg total) by mouth daily.    Dulaglutide (TRULICITY) 1.5 MG/0.5ML SOAJ Inject 1.5 mg into the skin once a week.    erythromycin  ophthalmic ointment Apply 1 a small amount into right eye every night    finasteride (PROSCAR) 5 MG tablet Take 1 tablet (5 mg total) by mouth daily.    gabapentin (NEURONTIN) 600 MG tablet TAKE 1 TABLET BY MOUTH 2 TIMES DAILY    glucose blood (ONETOUCH ULTRA) test strip USE AS DIRECTED THREE TIMES A DAY    hydrALAZINE (APRESOLINE) 100 MG tablet TAKE 1 TABLET BY MOUTH THREE TIMES DAILY    Insulin Glargine (BASAGLAR KWIKPEN) 100 UNIT/ML Inject 21-25 units into the skin daily. Adjust as directed by PCP.    insulin lispro (HUMALOG KWIKPEN) 100 UNIT/ML KwikPen Inject 6-10 Units into the skin 3 (three) times daily. Sliding scale.    Insulin Pen Needle (BD PEN NEEDLE NANO U/F) 32G X 4 MM MISC USE EVERY DAY AS DIRECTED    isosorbide mononitrate (IMDUR) 60 MG 24 hr tablet TAKE 1 TABLET BY MOUTH DAILY    levothyroxine (SYNTHROID) 125 MCG tablet TAKE 1 TABLET EVERY DAY ON EMPTY STOMACHWITH A GLASS OF WATER AT LEAST 30-60 MINBEFORE BREAKFAST    losartan (COZAAR) 100 MG tablet Take 1 tablet (100 mg total) by mouth daily.    Lysine 500 MG CAPS Take 1 capsule by mouth daily.    metolazone (ZAROXOLYN) 5 MG tablet Take 5 mg by mouth daily.    pantoprazole (PROTONIX) 40 MG tablet TAKE ONE TABLET BY MOUTH  DAILY BEFORE BREAKFAST 02/26/2023: Reports only using as needed   rosuvastatin (CRESTOR) 20 MG tablet TAKE ONE TABLET BY MOUTH DAILY    senna (SENOKOT) 8.6 MG tablet Take by mouth.    SENNA PO Take 1 capsule by mouth in the morning, at noon, in the evening, and at bedtime.    SYMBICORT 80-4.5 MCG/ACT inhaler INHALE 2 PUFFS INTO LUNGS TWICE DAILY 02/26/2023: Reports only using as needed   terazosin (HYTRIN) 5 MG capsule TAKE 1 CAPSULE BY MOUTH DAILY. TAKE IN PLACE OF DOXAZOSIN.    VITAMIN D, CHOLECALCIFEROL, PO Take by mouth.    No facility-administered encounter medications on file as of 05/21/2023.      Interventions:  Goals Addressed             This Visit's Progress    Care Management       Current  Barriers:  Care Management support and education needs related to Hypertension  Planned Interventions: Hypertension Reviewed current treatment plan related to Hypertension, self-management, and adherence to plan as established by provider.  Reviewed medications and indications for use. Reports taking medications as prescribed. Denies concerns r/t medication management or prescription cost. Provided information regarding established blood pressure parameters along with indications for notifying a provider. Advised to monitor BP consistently and record readings.  Reviewed symptoms. Denies chest pain or palpitations. Denies headaches, dizziness, or visual changes.  Reviewed compliance with recommended cardiac prudent diet. Encouraged continue reading nutrition labels, continue monitoring sodium intake, and avoid highly processed foods when possible.  Reviewed s/sx of heart attack, stroke and worsening symptoms that require immediate medical attention.  Symptom Management: Take all medications as prescribed Attend all scheduled provider appointments Call pharmacy for medication refills 3-7 days in advance of running out of medications Check blood pressure and keep a log Call doctor for signs and symptoms of high blood pressure Read nutrition labels. Eat whole grains, fruits and vegetables, lean meats and healthy fats Limit salt intake  Call provider office for new concerns or questions     ========================================= Current Barriers:  Care Management support and education needs related to Heart Failure  Planned Interventions: CHF Discussed current plan for CHF management.  Reviewed medications. Reports taking as prescribed. Advised to keep the care team updated of concerns related to medication management or prescription costs. Reviewed recommended weight parameters. Encouraged to weigh daily and record readings.  Encouraged to notify provider for weight gain greater than 3  lbs overnight or weight gain greater than 5 lbs within a week.  Discussed s/sx of fluid overload and indications for notifying a provider. Continues to experience fatigue and shortness of breath with minimal exertional. Advised to assess symptoms daily and contact clinic with changes. Reviewed nutritional intake. Advised to closely monitor sodium consumption and avoid highly processed foods when possible. Reviewed worsening s/sx related to CHF exacerbation and indications for seeking immediate medical attention.   Patient Goals/Self-Care Activities:  Take all medications as prescribed Monitor weight and record readings Adhere to recommended cardiac prudent/heart healthy diet Limit sodium intake Notify provider for weight gain outside of established parameters Contact provider or care management team with questions and new concerns as needed.  ======================================= Current Barriers:  Care Management support and education needs related to Hyperlipidemia  Planned Interventions: Hyperlipidemia Reviewed plan for management of hyperlipidemia. Reviewed established cholesterol goals reviewed. Reviewed medications. Reports taking medications as prescribed.  Discussed importance of regular laboratory monitoring as prescribed. Reviewed role and benefits of statin for ASCVD risk reduction. Reports  tolerating rosuvastatin well. Reviewed importance of limiting foods high in cholesterol. Reviewed exercise goals. Reports ability to engage in regular exercise is limited d/t degenerative joints. Encouraged to engage in low impact activities as tolerated. Reviewed s/sx of heart attack, stroke along with indications for seeking immediate medical attention.  Patient Goals/Self-Care Activities:  Take medications as prescribed   Attend all scheduled provider appointments Call pharmacy for medication refills 3-7 days in advance of running out of medications Call doctor with any symptoms you  believe are related to your medicine Follow recommended cardiac diet Call provider office for new concerns or questions         PLAN Will follow up in two months   Juanell Fairly [facility] RN Care Manager Direct Dial: [phone]  Fax: [phone] Website: [email or web address]

## 2023-05-22 ENCOUNTER — Other Ambulatory Visit: Payer: Self-pay | Admitting: Family Medicine

## 2023-05-22 DIAGNOSIS — I1A0 Resistant hypertension: Secondary | ICD-10-CM

## 2023-05-24 DIAGNOSIS — M25551 Pain in right hip: Secondary | ICD-10-CM | POA: Diagnosis not present

## 2023-05-24 DIAGNOSIS — M9943 Connective tissue stenosis of neural canal of lumbar region: Secondary | ICD-10-CM | POA: Diagnosis not present

## 2023-05-25 ENCOUNTER — Inpatient Hospital Stay
Admission: RE | Admit: 2023-05-25 | Discharge: 2023-05-25 | Disposition: A | Discharge status: Home / Self Care | Payer: Self-pay | Source: Ambulatory Visit | Attending: Physician Assistant | Admitting: Physician Assistant

## 2023-05-25 ENCOUNTER — Other Ambulatory Visit: Payer: Self-pay

## 2023-05-25 DIAGNOSIS — M25551 Pain in right hip: Secondary | ICD-10-CM | POA: Diagnosis not present

## 2023-05-25 DIAGNOSIS — Z049 Encounter for examination and observation for unspecified reason: Secondary | ICD-10-CM

## 2023-05-25 DIAGNOSIS — M9943 Connective tissue stenosis of neural canal of lumbar region: Secondary | ICD-10-CM | POA: Diagnosis not present

## 2023-05-28 DIAGNOSIS — G4733 Obstructive sleep apnea (adult) (pediatric): Secondary | ICD-10-CM | POA: Diagnosis not present

## 2023-05-29 NOTE — Progress Notes (Signed)
 Referring Physician:  Kathlynn Sharper, MD [address] [facility] [place] [address]  Primary Physician:  Gasper Nancyann BRAVO, MD  History of Present Illness: 05/30/2023 [name] is here today with a chief complaint of back pain that is progressively getting worse over the past several months.  He states it starts in his lumbar spine and radiates around his right hip into the front of his right shin.  This is exacerbated with walking or standing for a long period of time.  He does add that he finds himself leaning forward to help relieve his pain.  Just recently he had to start using a rollator secondary to pain.  He adds that the pain is sharp and burning in nature.  He denies any weakness and has been taking Tylenol  and gabapentin  for his pain.  Denies any changes to his bowel or bladder.   Conservative measures:  Physical therapy:  has not participated in within the last year? Multimodal medical therapy including regular antiinflammatories:  tylenol , oxycodone , gabapentin , metaxalone, aleve , prednisone  Injections:  05/16/2023: Left hip joint injection 05/01/2023: Bilateral L5-S1 transforaminal ESI (0) 04/04/2023: Right hip joint injection (75% relief) 01/26/2023: Bilateral S1 transforaminal ESI (little relief) 11/30/2020: Bilateral S1 transforaminal ESI (continued 60% improvement with an additional 10% after this injection) 08/24/2020: Right L5-S1 and left S1 transforaminal ESI (60% improvement)   Past Surgery: no spinal surgeries  Jacob Herrera has no symptoms of cervical myelopathy.  The symptoms are causing a significant impact on the patient's life.   Review of Systems:  A 10 point review of systems is negative, except for the pertinent positives and negatives detailed in the HPI.  Past Medical History: Past Medical History:  Diagnosis Date   Asthma    CHF (congestive heart failure) (HCC)    Chronic lower back pain     Complication of anesthesia    unknow difficulty after cardiac cath   Diabetes mellitus, type 2 (HCC)    History of chicken pox    Hx of skin cancer, basal cell    Hypertension    Hypothyroidism    Kidney disease    stage 3   Neuropathy    feet   Scoliosis    Sleep apnea    BiPAP   Thyrotoxicosis    2010    Past Surgical History: Past Surgical History:  Procedure Laterality Date   CARDIAC CATHETERIZATION N/A 03/31/2015   Procedure: Left Heart Cath and Coronary Angiography;  Surgeon: Vinie DELENA Jude, MD;  Location: [facility] INVASIVE CV [facility];  Service: Cardiovascular;  Laterality: N/A;   CARDIAC CATHETERIZATION N/A 03/31/2015   Procedure: Coronary Stent Intervention;  Surgeon: Vinie DELENA Jude, MD;  Location: [facility] CV [facility];  Service: Cardiovascular;  Laterality: N/A;   CARDIAC CATHETERIZATION N/A 03/31/2015   Procedure: Coronary Stent Intervention;  Surgeon: Cara JONETTA Lovelace, MD;  Location: [facility] CV [facility];  Service: Cardiovascular;  Laterality: N/A;   CATARACT EXTRACTION W/PHACO Right 05/11/2020   Procedure: CATARACT EXTRACTION PHACO AND INTRAOCULAR LENS PLACEMENT (IOC) RIGHT DIABETIC 6.52 01:11.6 9.1%;  Surgeon: Mittie Gaskin, MD;  Location: [facility];  Service: Ophthalmology;  Laterality: Right;  Diabetic - insulin  sleep apnea   CATARACT EXTRACTION W/PHACO Left 06/02/2020   Procedure: CATARACT EXTRACTION PHACO AND INTRAOCULAR LENS PLACEMENT (IOC) LEFT DIABETIC;  Surgeon: Mittie Gaskin, MD;  Location: [facility];  Service: Ophthalmology;  Laterality: Left;  3.14 0:45.0 7.0%   COLONOSCOPY WITH PROPOFOL  N/A 04/04/2021  Procedure: COLONOSCOPY WITH PROPOFOL ;  Surgeon: Onita Elspeth Sharper, DO;  Location: [facility]COPY;  Service: Gastroenterology;  Laterality: N/A;   ESOPHAGOGASTRODUODENOSCOPY (EGD) WITH PROPOFOL  N/A 04/04/2021   Procedure: ESOPHAGOGASTRODUODENOSCOPY (EGD) WITH PROPOFOL ;  Surgeon: Onita Elspeth Sharper, DO;  Location: [facility]  ENDOSCOPY;  Service: Gastroenterology;  Laterality: N/A;  IDDM   MOHS SURGERY Left 05/12/2019   Left nares by Bette Bon, MD [facility]   NASAL SINUS SURGERY  2005   TONSILLECTOMY AND ADENOIDECTOMY  1950    Allergies: Allergies as of 05/30/2023 - Review Complete 05/30/2023  Allergen Reaction Noted   Chlorthalidone  Other (See Comments) 05/26/2019   Spironolactone Other (See Comments) 05/24/2017    Medications: Outpatient Encounter Medications as of 05/30/2023  Medication Sig   acetaminophen  (TYLENOL ) 500 MG tablet Take 500 mg by mouth every 6 (six) hours as needed.   albuterol  (PROVENTIL ) (2.5 MG/3ML) 0.083% nebulizer solution Take 3 mLs (2.5 mg total) by nebulization every 4 (four) hours. And PRN (Patient taking differently: Take 2.5 mg by nebulization as needed.)   allopurinol (ZYLOPRIM) 100 MG tablet Take by mouth.   amLODipine  (NORVASC ) 5 MG tablet Take 1 tablet (5 mg total) by mouth daily.   aspirin  81 MG tablet Take 81 mg by mouth daily.   bumetanide (BUMEX) 2 MG tablet Take 2 mg by mouth 2 (two) times daily.   carvedilol  (COREG ) 25 MG tablet TAKE 1 TABLET BY MOUTH TWICE DAILY   cetirizine (ZYRTEC) 10 MG tablet Take 10 mg by mouth daily.   Continuous Glucose Sensor (FREESTYLE LIBRE 3 PLUS SENSOR) MISC Change sensor every 15 days.   Cyanocobalamin (VITAMIN B 12 PO) Take 1,000 mcg by mouth daily.    dapagliflozin  propanediol ([name] ) 10 MG TABS tablet Take 1 tablet (10 mg total) by mouth daily.   Dulaglutide  (TRULICITY ) 1.5 MG/0.5ML SOAJ Inject 1.5 mg into the skin once a week.   erythromycin ophthalmic ointment Apply 1 a small amount into right eye every night   finasteride  (PROSCAR ) 5 MG tablet Take 1 tablet (5 mg total) by mouth daily.   gabapentin  (NEURONTIN ) 600 MG tablet TAKE 1 TABLET BY MOUTH 2 TIMES DAILY   glucose blood (ONETOUCH ULTRA) test strip USE AS DIRECTED THREE TIMES A DAY   hydrALAZINE  (APRESOLINE ) 100 MG tablet TAKE 1 TABLET BY MOUTH THREE TIMES DAILY    Insulin  Glargine (BASAGLAR  KWIKPEN) 100 UNIT/ML Inject 21-25 units into the skin daily. Adjust as directed by PCP.   insulin  lispro (HUMALOG  KWIKPEN) 100 UNIT/ML KwikPen Inject 6-10 Units into the skin 3 (three) times daily. Sliding scale.   Insulin  Pen Needle (BD PEN NEEDLE NANO U/F) 32G X 4 MM MISC USE EVERY DAY AS DIRECTED   isosorbide mononitrate (IMDUR) 60 MG 24 hr tablet TAKE 1 TABLET BY MOUTH DAILY   levothyroxine  (SYNTHROID ) 125 MCG tablet TAKE 1 TABLET EVERY DAY ON EMPTY STOMACHWITH A GLASS OF WATER AT LEAST 30-60 MINBEFORE BREAKFAST   losartan  (COZAAR ) 100 MG tablet Take 1 tablet (100 mg total) by mouth daily.   Lysine 500 MG CAPS Take 1 capsule by mouth daily.   metolazone (ZAROXOLYN) 5 MG tablet Take 5 mg by mouth 3 (three) times a week.   pantoprazole  (PROTONIX ) 40 MG tablet TAKE ONE TABLET BY MOUTH DAILY BEFORE BREAKFAST   rosuvastatin  (CRESTOR ) 20 MG tablet TAKE ONE TABLET BY MOUTH DAILY   senna (SENOKOT) 8.6 MG tablet Take by mouth.   SENNA PO Take 1 capsule by mouth in the morning, at noon, in  the evening, and at bedtime.   SYMBICORT  80-4.5 MCG/ACT inhaler INHALE 2 PUFFS INTO LUNGS TWICE DAILY   terazosin  (HYTRIN ) 5 MG capsule TAKE 1 CAPSULE BY MOUTH DAILY. TAKE IN PLACE OF DOXAZOSIN .   VITAMIN D , CHOLECALCIFEROL, PO Take by mouth.   [DISCONTINUED] cloNIDine  (CATAPRES ) 0.1 MG tablet TAKE ONE TABLET BY MOUTH TWICE DAILY   [DISCONTINUED] Continuous Blood Gluc Sensor (FREESTYLE LIBRE 3 SENSOR) MISC Place sensor on the arm every 14 days and use to monitor blood sugars continuously as directed.   No facility-administered encounter medications on file as of 05/30/2023.    Social History: Social History   Tobacco Use   Smoking status: Former    Current packs/day: 0.00    Average packs/day: 1 pack/day for 25.0 years (25.0 ttl pk-yrs)    Types: Cigarettes    Start date: 05/30/1963    Quit date: 05/29/1988    Years since quitting: 35.0   Smokeless tobacco: Current    Types: Chew    Tobacco comments:    chews 1 bag of loose leaf chew/week  Vaping Use   Vaping status: Never Used  Substance Use Topics   Alcohol use: No    Alcohol/week: 0.0 standard drinks of alcohol   Drug use: No    Family Medical History: Family History  Problem Relation Age of Onset   Emphysema Father    Cancer - Lung Father    Cancer - Ovarian Mother        66's   Breast cancer Mother        late 50's    Physical Examination: @VITALWITHPAIN @  General: Patient is well developed, well nourished, calm, collected, and in no apparent distress. Attention to examination is appropriate.  Psychiatric: Patient is non-anxious.  Head:  Pupils equal, round, and reactive to light.  ENT:  Oral mucosa appears well hydrated.  Neck:   Supple.  Full range of motion.  Respiratory: Patient is breathing without any difficulty.  Extremities: No edema.  Vascular: Palpable dorsal pedal pulses.  Skin:   On exposed skin, there are no abnormal skin lesions.  NEUROLOGICAL:     Awake, alert, oriented to person, place, and time.  Speech is clear and fluent. Fund of knowledge is appropriate.   Cranial Nerves: Pupils equal round and reactive to light.  Facial tone is symmetric.  Facial sensation is symmetric.  Tenderness to palpation of his lumbar paraspinals. Strength:  Side Iliopsoas Quads Hamstring PF DF EHL  R 4- 5 5 5 5 5   L 5 5 5 5 5 5     Absent reflexes  Absent reflexes in patella, Achilles, hamstrings bilaterally.   Clonus is not present.   Decree sensation right lower extremity Patient ambulates with a rollator.  Medical Decision Making  Imaging: IMPRESSION: 1. Generalized lumbar spine degeneration with scoliosis. Little change since 2022. 2. 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 3. Foraminal impingement on the left at L4-5 and on the right at L2-3, L3-4.    I have personally reviewed the images and agree with the above interpretation.  Assessment and Plan: Mr.  Herrera is a pleasant 73 y.o. male is here today with a chief complaint of back pain that is progressively getting worse over the past several months.  He states it starts in his lumbar spine and radiates around his right hip into the front of his right shin.  This is exacerbated with walking or standing for a long period of time.  He does add that  he finds himself leaning forward to help relieve his pain.  Just recently he had to start using a rollator secondary to pain.  He adds that the pain is sharp and burning in nature.  He denies any weakness and has been taking Tylenol  and gabapentin  for his pain.  Denies any changes to his bowel or bladder.  On examination he does have tenderness to a patient with lumbar paraspinals.  He does have noted weakness in his right hip flexion.  He has absent reflexes in bilateral lower extremities.  MRI and previous AP and lateral x-rays were reviewed at length.  He does have  significant scoliosis with compressive spinal stenosis from L2-L5 with worse foraminal impingement on the right.  It was a pleasure to see patient in clinic today.  We did discuss surgery today with his daughter available on the telephone.  Patient has both radicular and claudication symptoms.  Will need to make sure prior to surgery that he would have cardiac clearance we discussed at length expectations postoperatively if he decides he would like to go forward with surgery.  Plan now includes scoliosis films as well as lumbar flexion-extension x-rays.  We will review once complete to finalize potential surgical plans.  He is encouraged to reach out to us  in the meantime for questions or concerns moving forward.    Thank you for involving me in the care of this patient.   Lyle Decamp, PA-C Dept. of Neurosurgery

## 2023-05-30 ENCOUNTER — Ambulatory Visit: Payer: PPO | Admitting: Physician Assistant

## 2023-05-30 ENCOUNTER — Encounter: Payer: Self-pay | Admitting: Physician Assistant

## 2023-05-30 ENCOUNTER — Ambulatory Visit
Admission: RE | Admit: 2023-05-30 | Discharge: 2023-05-30 | Disposition: A | Discharge status: Home / Self Care | Payer: PPO | Attending: Physician Assistant | Admitting: Physician Assistant

## 2023-05-30 ENCOUNTER — Ambulatory Visit
Admission: RE | Admit: 2023-05-30 | Discharge: 2023-05-30 | Disposition: A | Discharge status: Home / Self Care | Payer: PPO | Source: Ambulatory Visit | Attending: Physician Assistant | Admitting: Physician Assistant

## 2023-05-30 VITALS — BP 108/62 | Ht 68.0 in | Wt 237.0 lb

## 2023-05-30 DIAGNOSIS — R531 Weakness: Secondary | ICD-10-CM

## 2023-05-30 DIAGNOSIS — M5416 Radiculopathy, lumbar region: Secondary | ICD-10-CM | POA: Diagnosis not present

## 2023-05-30 DIAGNOSIS — M4186 Other forms of scoliosis, lumbar region: Secondary | ICD-10-CM | POA: Insufficient documentation

## 2023-05-30 DIAGNOSIS — M48062 Spinal stenosis, lumbar region with neurogenic claudication: Secondary | ICD-10-CM

## 2023-05-30 DIAGNOSIS — M4183 Other forms of scoliosis, cervicothoracic region: Secondary | ICD-10-CM | POA: Diagnosis not present

## 2023-05-30 DIAGNOSIS — M4185 Other forms of scoliosis, thoracolumbar region: Secondary | ICD-10-CM | POA: Diagnosis not present

## 2023-05-30 DIAGNOSIS — M5136 Other intervertebral disc degeneration, lumbar region with discogenic back pain only: Secondary | ICD-10-CM | POA: Diagnosis not present

## 2023-05-30 DIAGNOSIS — M47816 Spondylosis without myelopathy or radiculopathy, lumbar region: Secondary | ICD-10-CM | POA: Diagnosis not present

## 2023-06-01 ENCOUNTER — Telehealth: Payer: Self-pay | Admitting: Family Medicine

## 2023-06-02 NOTE — Telephone Encounter (Signed)
 We changed this to 0.1mg  in December, is there some reason they are requesting 0.2mg ?

## 2023-06-05 NOTE — Telephone Encounter (Signed)
FYI-Patient reports that after Dr. Sherrie Mustache took him off the 0.2 mg  and put on 0.1 mg he went to see the Nephrologist and was taken off completely.  Called [facility] and advised.

## 2023-06-06 DIAGNOSIS — M1611 Unilateral primary osteoarthritis, right hip: Secondary | ICD-10-CM | POA: Diagnosis not present

## 2023-06-06 DIAGNOSIS — M5416 Radiculopathy, lumbar region: Secondary | ICD-10-CM | POA: Diagnosis not present

## 2023-06-06 DIAGNOSIS — M419 Scoliosis, unspecified: Secondary | ICD-10-CM | POA: Diagnosis not present

## 2023-06-06 DIAGNOSIS — M48062 Spinal stenosis, lumbar region with neurogenic claudication: Secondary | ICD-10-CM | POA: Diagnosis not present

## 2023-06-21 NOTE — Progress Notes (Signed)
 Referring Physician:  Malva Limes, MD [address] [place],  [place] [address]  Primary Physician:  Malva Limes, MD  History of Present Illness: 06/26/2023 [name] is here today with a chief complaint of severe back pain over many years.  This been worsening over time.  His posture has been worsening over time.  Later in the day or when he has been standing for any length of time, he notices that he is bending forward.  His back is very uncomfortable, and he requires a rollator much of the time.  He is unable to walk a long distance due to back pain and leg pain.  He has tried physical therapy in the past without improvement.  History of Present Illness: 05/30/2023 Note from [place], [place]. [name] is here today with a chief complaint of back pain that is progressively getting worse over the past several months.  He states it starts in his lumbar spine and radiates around his right hip into the front of his right shin.  This is exacerbated with walking or standing for a long period of time.  He does add that he finds himself leaning forward to help relieve his pain.  Just recently he had to start using a rollator secondary to pain.  He adds that the pain is sharp and burning in nature.  He denies any weakness and has been taking Tylenol and gabapentin for his pain.  Denies any changes to his bowel or bladder.     Conservative measures:  Physical therapy:  has not participated in within the last year? Multimodal medical therapy including regular antiinflammatories:  tylenol, oxycodone, gabapentin, metaxalone, aleve, prednisone  Injections:  05/16/2023: Left hip joint injection 05/01/2023: Bilateral L5-S1 transforaminal ESI (0) 04/04/2023: Right hip joint injection (75% relief) 01/26/2023: Bilateral S1 transforaminal ESI (little relief) 11/30/2020: Bilateral S1 transforaminal ESI (continued 60% improvement with an additional 10% after this  injection) 08/24/2020: Right L5-S1 and left S1 transforaminal ESI (60% improvement)    Past Surgery: no spinal surgeries   [name] has no symptoms of cervical myelopathy.   The symptoms are causing a significant impact on the patient's life.    Review of Systems:  A 10 point review of systems is negative, except for the pertinent positives and negatives detailed in the HPI.  Past Medical History: Past Medical History:  Diagnosis Date   Asthma    CHF (congestive heart failure) (HCC)    Chronic lower back pain    Complication of anesthesia    unknow "difficulty" after cardiac cath   Diabetes mellitus, type 2 (HCC)    History of chicken pox    Hx of skin cancer, basal cell    Hypertension    Hypothyroidism    Kidney disease    stage 3   Neuropathy    feet   Scoliosis    Sleep apnea    BiPAP   Thyrotoxicosis    2010    Past Surgical History: Past Surgical History:  Procedure Laterality Date   CARDIAC CATHETERIZATION N/A 03/31/2015   Procedure: Left Heart Cath and Coronary Angiography;  Surgeon: Dalia Heading, MD;  Location: [facility]ASIVE CV [facility];  Service: Cardiovascular;  Laterality: N/A;   CARDIAC CATHETERIZATION N/A 03/31/2015   Procedure: Coronary Stent Intervention;  Surgeon: Dalia Heading, MD;  Location: [facility];  Service: Cardiovascular;  Laterality: N/A;   CARDIAC CATHETERIZATION N/A 03/31/2015   Procedure: Coronary  Stent Intervention;  Surgeon: Alwyn Pea, MD;  Location: [facility];  Service: Cardiovascular;  Laterality: N/A;   CATARACT EXTRACTION W/PHACO Right 05/11/2020   Procedure: CATARACT EXTRACTION PHACO AND INTRAOCULAR LENS PLACEMENT (IOC) RIGHT DIABETIC 6.52 01:11.6 9.1%;  Surgeon: Lockie Mola, MD;  Location[facility];  Service: Ophthalmology;  Laterality: Right;  Diabetic - insulin sleep apnea   CATARACT EXTRACTION W/PHACO Left 06/02/2020   Procedure: CATARACT EXTRACTION PHACO AND INTRAOCULAR LENS  PLACEMENT (IOC) LEFT DIABETIC;  Surgeon: Lockie Mola, MD;  Location: [facility];  Service: Ophthalmology;  Laterality: Left;  3.14 0:45.0 7.0%   COLONOSCOPY WITH PROPOFOL N/A 04/04/2021   Procedure: COLONOSCOPY WITH PROPOFOL;  Surgeon: Jaynie Collins, DO;  Location: [facility] ENDOSCOPY;  Service: Gastroenterology;  Laterality: N/A;   ESOPHAGOGASTRODUODENOSCOPY (EGD) WITH PROPOFOL N/A 04/04/2021   Procedure: ESOPHAGOGASTRODUODENOSCOPY (EGD) WITH PROPOFOL;  Surgeon: Jaynie Collins, DO;  Location: [facility] ENDOSCOPY;  Service: Gastroenterology;  Laterality: N/A;  IDDM   MOHS SURGERY Left 05/12/2019   Left nares by Glennis Brink, MD [facility]   NASAL SINUS SURGERY  2005   TONSILLECTOMY AND ADENOIDECTOMY  1950    Allergies: Allergies as of 06/26/2023 - Review Complete 05/30/2023  Allergen Reaction Noted   Chlorthalidone Other (See Comments) 05/26/2019   Spironolactone Other (See Comments) 05/24/2017    Medications:  Current Outpatient Medications:    acetaminophen (TYLENOL) 500 MG tablet, Take 500 mg by mouth every 6 (six) hours as needed., Disp: , Rfl:    albuterol (PROVENTIL) (2.5 MG/3ML) 0.083% nebulizer solution, Take 3 mLs (2.5 mg total) by nebulization every 4 (four) hours. And PRN (Patient taking differently: Take 2.5 mg by nebulization as needed.), Disp: 75 mL, Rfl: 12   allopurinol (ZYLOPRIM) 100 MG tablet, Take by mouth., Disp: , Rfl:    aspirin 81 MG tablet, Take 81 mg by mouth daily., Disp: , Rfl:    bumetanide (BUMEX) 2 MG tablet, Take 2 mg by mouth 2 (two) times daily., Disp: , Rfl:    carvedilol (COREG) 25 MG tablet, TAKE 1 TABLET BY MOUTH TWICE DAILY, Disp: 180 tablet, Rfl: 4   cetirizine (ZYRTEC) 10 MG tablet, Take 10 mg by mouth daily., Disp: , Rfl:    Continuous Glucose Sensor (FREESTYLE LIBRE 3 PLUS SENSOR) MISC, Change sensor every 15 days., Disp: 6 each, Rfl: 3   Cyanocobalamin (VITAMIN B 12 PO), Take 1,000 mcg by mouth daily. , Disp: ,  Rfl:    dapagliflozin propanediol ([name]) 10 MG TABS tablet, Take 1 tablet (10 mg total) by mouth daily., Disp: 90 tablet, Rfl: 3   Dulaglutide (TRULICITY) 1.5 MG/0.5ML SOAJ, Inject 1.5 mg into the skin once a week., Disp: 8 mL, Rfl: 3   erythromycin ophthalmic ointment, Apply 1 a small amount into right eye every night, Disp: , Rfl:    finasteride (PROSCAR) 5 MG tablet, Take 1 tablet (5 mg total) by mouth daily., Disp: 90 tablet, Rfl: 1   gabapentin (NEURONTIN) 600 MG tablet, TAKE 1 TABLET BY MOUTH 2 TIMES DAILY, Disp: 180 tablet, Rfl: 4   glucose blood (ONETOUCH ULTRA) test strip, USE AS DIRECTED THREE TIMES A DAY, Disp: 100 each, Rfl: 3   hydrALAZINE (APRESOLINE) 100 MG tablet, TAKE 1 TABLET BY MOUTH THREE TIMES DAILY, Disp: 270 tablet, Rfl: 4   Insulin Glargine (BASAGLAR KWIKPEN) 100 UNIT/ML, Inject 21-25 units into the skin daily. Adjust as directed by PCP., Disp: 30 mL, Rfl: 4   insulin lispro (HUMALOG KWIKPEN) 100  UNIT/ML KwikPen, Inject 6-10 Units into the skin 3 (three) times daily. Sliding scale., Disp: 45 mL, Rfl: 3   Insulin Pen Needle (BD PEN NEEDLE NANO U/F) 32G X 4 MM MISC, USE EVERY DAY AS DIRECTED, Disp: 100 each, Rfl: 3   isosorbide mononitrate (IMDUR) 60 MG 24 hr tablet, TAKE 1 TABLET BY MOUTH DAILY, Disp: 90 tablet, Rfl: 4   levothyroxine (SYNTHROID) 125 MCG tablet, TAKE 1 TABLET EVERY DAY ON EMPTY STOMACHWITH A GLASS OF WATER AT LEAST 30-60 MINBEFORE BREAKFAST, Disp: 90 tablet, Rfl: 3   losartan (COZAAR) 100 MG tablet, Take 1 tablet (100 mg total) by mouth daily., Disp: 30 tablet, Rfl: 1   Lysine 500 MG CAPS, Take 1 capsule by mouth daily., Disp: , Rfl:    metolazone (ZAROXOLYN) 5 MG tablet, Take 5 mg by mouth 3 (three) times a week., Disp: , Rfl:    pantoprazole (PROTONIX) 40 MG tablet, TAKE ONE TABLET BY MOUTH DAILY BEFORE BREAKFAST, Disp: 30 tablet, Rfl: 0   rosuvastatin (CRESTOR) 20 MG tablet, TAKE ONE TABLET BY MOUTH DAILY, Disp: 90 tablet, Rfl: 3   senna (SENOKOT) 8.6  MG tablet, Take by mouth., Disp: , Rfl:    SENNA PO, Take 1 capsule by mouth in the morning, at noon, in the evening, and at bedtime., Disp: , Rfl:    SYMBICORT 80-4.5 MCG/ACT inhaler, INHALE 2 PUFFS INTO LUNGS TWICE DAILY, Disp: 10.2 g, Rfl: 0   terazosin (HYTRIN) 5 MG capsule, TAKE 1 CAPSULE BY MOUTH DAILY. TAKE IN PLACE OF DOXAZOSIN., Disp: 90 capsule, Rfl: 4   traMADol (ULTRAM) 50 MG tablet, Take by mouth., Disp: , Rfl:    VITAMIN D, CHOLECALCIFEROL, PO, Take by mouth., Disp: , Rfl:   Social History: Social History   Tobacco Use   Smoking status: Former    Current packs/day: 0.00    Average packs/day: 1 pack/day for 25.0 years (25.0 ttl pk-yrs)    Types: Cigarettes    Start date: 05/30/1963    Quit date: 05/29/1988    Years since quitting: 35.0   Smokeless tobacco: Current    Types: Chew   Tobacco comments:    chews 1 bag of loose leaf chew/week  Vaping Use   Vaping status: Never Used  Substance Use Topics   Alcohol use: No    Alcohol/week: 0.0 standard drinks of alcohol   Drug use: No    Family Medical History: Family History  Problem Relation Age of Onset   Emphysema Father    Cancer - Lung Father    Cancer - Ovarian Mother        41's   Breast cancer Mother        late 40's    Physical Examination: Vitals:   06/26/23 1455  BP: (!) 140/72    General: Patient is in no apparent distress. Attention to examination is appropriate.  Neck:   Supple.  Full range of motion.  Respiratory: Patient is breathing without any difficulty.   NEUROLOGICAL:     Awake, alert, oriented to person, place, and time.  Speech is clear and fluent.   Cranial Nerves: Pupils equal round and reactive to light.  Facial tone is symmetric.  Facial sensation is symmetric. Shoulder shrug is symmetric. Tongue protrusion is midline.  There is no pronator drift.  Strength: Side Biceps Triceps Deltoid Interossei Grip Wrist Ext. Wrist Flex.  R 5 5 5 5 5 5 5   L 5 5 5 5 5 5 5    Side  Iliopsoas  Quads Hamstring PF DF EHL  R 4 5 5 5 5 5   L 5 5 5 5 5 5    Reflexes are 1+ and symmetric at the biceps, triceps, brachioradialis, patella and achilles.   Hoffman's is absent.   Bilateral upper and lower extremity sensation is intact to light touch.    No evidence of dysmetria noted.  Gait is abnormal - uses rollator.     Medical Decision Making  Imaging: L spine xrays 05/30/2023 FINDINGS: Minimal cervicothoracic scoliotic curvature, less than 5 degrees. Thoracolumbar scoliotic curvature convex to the left with the apex at L2. Cobb angle measured from L1 through L4 of 36 degrees.   IMPRESSION: Thoracolumbar scoliotic curvature convex to the left with the apex at L2. Cobb angle measured from L1 through L4 of 36 degrees.     Electronically Signed   By: Paulina Fusi M.D.   On: 06/13/2023 21:16  PI 51 (using L spine xrays) LL 28 Cobb angle L1-4 (coronal) 36 +SVA 19 CM  MRI L spine 02/27/2023 IMPRESSION: 1. Generalized lumbar spine degeneration with scoliosis. Little change since 2022. 2. 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 3. Foraminal impingement on the left at L4-5 and on the right at L2-3, L3-4.     Electronically Signed   By: Tiburcio Pea M.D.   On: 03/21/2023 08:25     I have personally reviewed the images and agree with the above interpretation.  Assessment and Plan: [name] is a pleasant 73 y.o. male with acquired scoliosis with substantial coronal and sagittal imbalance.  He has a 36 degree coronal curve between L1 and L4.  He has at least 23 degrees of spinal pelvic mismatch.  He has 19 cm of positive sagittal balance when measuring from C2.  He has progressive findings on multiple x-rays over the past decade.  At this point, I do not feel that physical therapy has any reasonable hope of improving his condition.  We discussed the possibility of spinal cord stimulation, but I think it is more appropriate to consider him for thoracolumbar  reconstruction to reestablish normal alignment.  To finalize operative plans, I have recommended CT scan of the thoracic and lumbar spine, thoracic spine MRI, and bone density evaluation.  I have also recommended that he discontinue using nicotine products.  His daughter lives in [place].  There is some chance he will choose to have surgery closer to his daughter.  I think that is a reasonable decision.  If he decides for that, I will be happy to refer him to Dr. Tye Maryland.   Thank you for involving me in the care of this patient.      Sindhu Nguyen K. Myer Haff MD, [facility]

## 2023-06-25 DIAGNOSIS — I5033 Acute on chronic diastolic (congestive) heart failure: Secondary | ICD-10-CM | POA: Diagnosis not present

## 2023-06-25 DIAGNOSIS — E782 Mixed hyperlipidemia: Secondary | ICD-10-CM | POA: Diagnosis not present

## 2023-06-25 DIAGNOSIS — I10 Essential (primary) hypertension: Secondary | ICD-10-CM | POA: Diagnosis not present

## 2023-06-25 DIAGNOSIS — I25118 Atherosclerotic heart disease of native coronary artery with other forms of angina pectoris: Secondary | ICD-10-CM | POA: Diagnosis not present

## 2023-06-25 DIAGNOSIS — G4733 Obstructive sleep apnea (adult) (pediatric): Secondary | ICD-10-CM | POA: Diagnosis not present

## 2023-06-26 ENCOUNTER — Encounter: Payer: Self-pay | Admitting: Neurosurgery

## 2023-06-26 ENCOUNTER — Ambulatory Visit: Payer: PPO | Admitting: Neurosurgery

## 2023-06-26 VITALS — BP 140/72 | Ht 68.0 in | Wt 237.0 lb

## 2023-06-26 DIAGNOSIS — M4316 Spondylolisthesis, lumbar region: Secondary | ICD-10-CM | POA: Diagnosis not present

## 2023-06-26 DIAGNOSIS — M4186 Other forms of scoliosis, lumbar region: Secondary | ICD-10-CM | POA: Diagnosis not present

## 2023-06-26 DIAGNOSIS — M438X9 Other specified deforming dorsopathies, site unspecified: Secondary | ICD-10-CM

## 2023-06-26 DIAGNOSIS — M48062 Spinal stenosis, lumbar region with neurogenic claudication: Secondary | ICD-10-CM | POA: Diagnosis not present

## 2023-06-26 DIAGNOSIS — M419 Scoliosis, unspecified: Secondary | ICD-10-CM

## 2023-06-26 DIAGNOSIS — G894 Chronic pain syndrome: Secondary | ICD-10-CM

## 2023-07-09 ENCOUNTER — Ambulatory Visit: Payer: PPO | Admitting: Primary Care

## 2023-07-09 ENCOUNTER — Encounter: Payer: Self-pay | Admitting: Family Medicine

## 2023-07-09 ENCOUNTER — Ambulatory Visit: Payer: PPO | Admitting: Family Medicine

## 2023-07-09 VITALS — BP 115/52 | HR 74 | Resp 18 | Wt 239.0 lb

## 2023-07-09 DIAGNOSIS — E66812 Obesity, class 2: Secondary | ICD-10-CM

## 2023-07-09 DIAGNOSIS — N184 Chronic kidney disease, stage 4 (severe): Secondary | ICD-10-CM

## 2023-07-09 DIAGNOSIS — G4700 Insomnia, unspecified: Secondary | ICD-10-CM | POA: Diagnosis not present

## 2023-07-09 DIAGNOSIS — G8929 Other chronic pain: Secondary | ICD-10-CM | POA: Diagnosis not present

## 2023-07-09 DIAGNOSIS — E039 Hypothyroidism, unspecified: Secondary | ICD-10-CM | POA: Diagnosis not present

## 2023-07-09 DIAGNOSIS — E6609 Other obesity due to excess calories: Secondary | ICD-10-CM

## 2023-07-09 DIAGNOSIS — E1121 Type 2 diabetes mellitus with diabetic nephropathy: Secondary | ICD-10-CM | POA: Diagnosis not present

## 2023-07-09 DIAGNOSIS — I1A0 Resistant hypertension: Secondary | ICD-10-CM | POA: Diagnosis not present

## 2023-07-09 DIAGNOSIS — M545 Low back pain, unspecified: Secondary | ICD-10-CM | POA: Diagnosis not present

## 2023-07-09 DIAGNOSIS — Z6837 Body mass index (BMI) 37.0-37.9, adult: Secondary | ICD-10-CM | POA: Diagnosis not present

## 2023-07-09 LAB — POCT GLYCOSYLATED HEMOGLOBIN (HGB A1C)
Est. average glucose Bld gHb Est-mCnc: 137
Hemoglobin A1C: 6.4 % — AB (ref 4.0–5.6)

## 2023-07-09 NOTE — Patient Instructions (Signed)
 Please review the attached list of medications and notify my office if there are any errors.   You can take Tylenol PM or Unisom at night as needed to help sleep  You can skip a dose of hydralazine on days you take metolazone if your blood pressure is low and you feel weak or dizzy.

## 2023-07-09 NOTE — Progress Notes (Signed)
 Established patient visit   Patient: Jacob Herrera   DOB: May 06, 1950   73 y.o. Male  MRN: [id number] Visit Date: 07/09/2023  Today's healthcare provider: Mila Merry, MD   Chief Complaint  Patient presents with   Medical Management of Chronic Issues    Follow-up Hypertension and Diabetes   Subjective    HPI Ambient AI software was used to assist with clinical note transcription.   History of Present Illness   Jacob Herrera is a 73 year old male with scoliosis who presents for follow up type 2 diabetes, resistant hypertension, and CKD4. He reports significant back pain due to scoliosis, particularly severe in the mornings, which makes it difficult for him to stand. He finds temporary relief through stretching. He has been informed that surgical intervention is required, involving two surgeries with a hospital stay of six days.  He has been having trouble falling asleep over the past three to four weeks. Despite feeling tired and sleepy when going to bed, he is unable to fall asleep and often tosses and turns for two to three hours. He attributes this to staying up late watching TV, particularly ball games, until one o'clock in the morning. This has resulted in him sleeping until eleven o'clock, which he dislikes.  His blood pressure has been running low, and he feels lightheaded and sometimes faint. His nephrologist recently adjusted his medication regimen by discontinuing amlodipine and starting metolazone three days a week (Monday, Wednesday, and Friday). He also takes hydralazine, but he is unsure if the low blood pressure correlates with the days he takes metolazone. He checks his blood pressure occasionally, noting it is generally around 128/65, but suspects it may be lower at times.  He reports cortisone infection for back pain which increased his blood sugar. He managed to bring his blood sugar back down after a month of elevated levels.  He uses a walker,  particularly for long distances or when his back pain is severe, such as when visiting the hospital for x-rays or consultations.     Lab Results  Component Value Date   NA 137 04/14/2022   K 4.9 04/14/2022   CREATININE 2.19 (H) 04/14/2022   EGFR 33 12/11/2022   GLUCOSE 152 (H) 04/14/2022   Lab Results  Component Value Date   HGBA1C 6.4 (A) 07/09/2023   Lab Results  Component Value Date   CHOL 131 04/14/2022   HDL 37 (L) 04/14/2022   LDLCALC 50 04/14/2022   TRIG 285 (H) 04/14/2022   CHOLHDL 3.5 04/14/2022   Lab Results  Component Value Date   TSH 1.500 12/18/2022     Medications: Outpatient Medications Prior to Visit  Medication Sig   acetaminophen (TYLENOL) 500 MG tablet Take 500 mg by mouth every 6 (six) hours as needed.   albuterol (PROVENTIL) (2.5 MG/3ML) 0.083% nebulizer solution Take 3 mLs (2.5 mg total) by nebulization every 4 (four) hours. And PRN (Patient taking differently: Take 2.5 mg by nebulization as needed.)   allopurinol (ZYLOPRIM) 100 MG tablet Take by mouth.   aspirin 81 MG tablet Take 81 mg by mouth daily.   bumetanide (BUMEX) 2 MG tablet Take 2 mg by mouth 2 (two) times daily.   carvedilol (COREG) 25 MG tablet TAKE 1 TABLET BY MOUTH TWICE DAILY   cetirizine (ZYRTEC) 10 MG tablet Take 10 mg by mouth daily.   Continuous Glucose Sensor (FREESTYLE LIBRE 3 PLUS SENSOR) MISC Change sensor every 15 days.   Cyanocobalamin (  VITAMIN B 12 PO) Take 1,000 mcg by mouth daily.    dapagliflozin propanediol ([name]) 10 MG TABS tablet Take 1 tablet (10 mg total) by mouth daily.   Dulaglutide (TRULICITY) 1.5 MG/0.5ML SOAJ Inject 1.5 mg into the skin once a week.   erythromycin ophthalmic ointment Apply 1 a small amount into right eye every night   finasteride (PROSCAR) 5 MG tablet Take 1 tablet (5 mg total) by mouth daily.   gabapentin (NEURONTIN) 600 MG tablet TAKE 1 TABLET BY MOUTH 2 TIMES DAILY   glucose blood (ONETOUCH ULTRA) test strip USE AS DIRECTED THREE TIMES A  DAY   hydrALAZINE (APRESOLINE) 100 MG tablet TAKE 1 TABLET BY MOUTH THREE TIMES DAILY   Insulin Glargine (BASAGLAR KWIKPEN) 100 UNIT/ML Inject 21-25 units into the skin daily. Adjust as directed by PCP.   insulin lispro (HUMALOG KWIKPEN) 100 UNIT/ML KwikPen Inject 6-10 Units into the skin 3 (three) times daily. Sliding scale.   Insulin Pen Needle (BD PEN NEEDLE NANO U/F) 32G X 4 MM MISC USE EVERY DAY AS DIRECTED   isosorbide mononitrate (IMDUR) 60 MG 24 hr tablet TAKE 1 TABLET BY MOUTH DAILY   levothyroxine (SYNTHROID) 125 MCG tablet TAKE 1 TABLET EVERY DAY ON EMPTY STOMACHWITH A GLASS OF WATER AT LEAST 30-60 MINBEFORE BREAKFAST   losartan (COZAAR) 100 MG tablet Take 1 tablet (100 mg total) by mouth daily.   Lysine 500 MG CAPS Take 1 capsule by mouth daily.   metolazone (ZAROXOLYN) 5 MG tablet Take 5 mg by mouth 3 (three) times a week.   pantoprazole (PROTONIX) 40 MG tablet TAKE ONE TABLET BY MOUTH DAILY BEFORE BREAKFAST   rosuvastatin (CRESTOR) 20 MG tablet TAKE ONE TABLET BY MOUTH DAILY   senna (SENOKOT) 8.6 MG tablet Take by mouth.   SENNA PO Take 1 capsule by mouth in the morning, at noon, in the evening, and at bedtime.   SYMBICORT 80-4.5 MCG/ACT inhaler INHALE 2 PUFFS INTO LUNGS TWICE DAILY   terazosin (HYTRIN) 5 MG capsule TAKE 1 CAPSULE BY MOUTH DAILY. TAKE IN PLACE OF DOXAZOSIN.   traMADol (ULTRAM) 50 MG tablet Take by mouth.   VITAMIN D, CHOLECALCIFEROL, PO Take by mouth.   No facility-administered medications prior to visit.    Review of Systems  Constitutional:  Negative for appetite change, chills and fever.  Respiratory:  Negative for chest tightness, shortness of breath and wheezing.   Cardiovascular:  Negative for chest pain and palpitations.  Gastrointestinal:  Negative for abdominal pain, nausea and vomiting.       Objective    BP (!) 115/52 (BP Location: Left Arm, Patient Position: Sitting, Cuff Size: Large)   Pulse 74   Resp 18   Wt 239 lb (108.4 kg)   SpO2 97%    BMI 36.34 kg/m    Physical Exam   General appearance: Obese male, cooperative and in no acute distress Head: Normocephalic, without obvious abnormality, atraumatic Respiratory: Respirations even and unlabored, normal respiratory rate Extremities: All extremities are intact.  Skin: Skin color, texture, turgor normal. No rashes seen  Psych: Appropriate mood and affect. Neurologic: Mental status: Alert, oriented to person, place, and time, thought content appropriate.   Results for orders placed or performed in visit on 07/09/23  POCT glycosylated hemoglobin (Hb A1C)  Result Value Ref Range   Hemoglobin A1C 6.4 (A) 4.0 - 5.6 %   Est. average glucose Bld gHb Est-mCnc 137     Assessment & Plan     1. Diabetes  mellitus with nephropathy (HCC) (Primary) A1c near goal. Continue current medications.    2. Resistant hypertension Now well controlled with hypotensive sx on days he takes metolazone prescribed by nephrology. Advise he could skip one dose of hydralazine on days he takes metolazone if he blood pressure is low.   3. Hypothyroidism, unspecified type Doing well current dose of levothyroxine. Anticipated checking labs next visit since lab is closed today.   4. Class 2 obesity due to excess calories with body mass index (BMI) of 37.0 to 37.9 in adult, unspecified whether serious comorbidity present   5. Chronic kidney disease, stage IV (severe) (HCC)   6. Insomnia, unspecified type Can try OTC tylenol PM or Unisom  7. Chronic midline low back pain without sciatica Follow up Dr. Myer Haff as scheduled.   Return in about 4 months (around 11/08/2023) for Diabetes, Hypertension.         Mila Merry, MD  [facility] [phone] (phone) [phone] (fax)  [facility]

## 2023-07-13 ENCOUNTER — Ambulatory Visit: Payer: PPO | Admitting: Nurse Practitioner

## 2023-07-13 ENCOUNTER — Encounter: Payer: Self-pay | Admitting: Nurse Practitioner

## 2023-07-13 VITALS — BP 118/60 | HR 72 | Temp 98.0°F | Ht 68.0 in | Wt 238.0 lb

## 2023-07-13 DIAGNOSIS — J4520 Mild intermittent asthma, uncomplicated: Secondary | ICD-10-CM | POA: Diagnosis not present

## 2023-07-13 DIAGNOSIS — G4733 Obstructive sleep apnea (adult) (pediatric): Secondary | ICD-10-CM | POA: Diagnosis not present

## 2023-07-13 MED ORDER — SYMBICORT 80-4.5 MCG/ACT IN AERO: 2.0000 | INHALATION_SPRAY | Freq: Two times a day (BID) | RESPIRATORY_TRACT | 2 refills | Status: DC | PRN

## 2023-07-13 NOTE — Assessment & Plan Note (Signed)
 Very severe OSA on BiPAP.  Excellent compliance and control.  Receives benefit from use.  Understands proper care/use of device.  Aware of risks of untreated sleep apnea.  Healthy weight loss encouraged.  Safe driving practices reviewed.  Patient Instructions  Continue to use BiPAP every night, minimum of 4-6 hours a night.  Change equipment every 30 days or as directed by DME. Wash your tubing with warm soap and water daily, hang to dry. Wash humidifier portion weekly. Use bottled, distilled water and change daily  Be aware of reduced alertness and do not drive or operate heavy machinery if experiencing this or drowsiness.  Exercise encouraged, as tolerated. Notify if persistent daytime sleepiness occurs even with consistent use of CPAP.  Continue Albuterol inhaler 2 puffs or 3 mL neb every 6 hours as needed for shortness of breath or wheezing. Notify if symptoms persist despite rescue inhaler/neb use.  Continue Symbicort 2 puffs Twice daily as needed for shortness of breath or wheezing. Brush tongue and rinse mouth afterwards   Let us know if you end up moving forward with the back surgery and we'll get you back in for preoperative evaluation if it's been over 60 days since we last saw you   Follow up in one year with Dr Belia Heman or Florentina Addison Ebony Yorio,NP. If symptoms do not improve or worsen, please contact office for sooner follow up or seek emergency care.

## 2023-07-13 NOTE — Assessment & Plan Note (Signed)
 Intermittent asthma.  Stable on current regimen.  Rare use of ICS/LABA.  No recent exacerbations.

## 2023-07-13 NOTE — Patient Instructions (Addendum)
 Continue to use BiPAP every night, minimum of 4-6 hours a night.  Change equipment every 30 days or as directed by DME. Wash your tubing with warm soap and water daily, hang to dry. Wash humidifier portion weekly. Use bottled, distilled water and change daily  Be aware of reduced alertness and do not drive or operate heavy machinery if experiencing this or drowsiness.  Exercise encouraged, as tolerated. Notify if persistent daytime sleepiness occurs even with consistent use of CPAP.  Continue Albuterol inhaler 2 puffs or 3 mL neb every 6 hours as needed for shortness of breath or wheezing. Notify if symptoms persist despite rescue inhaler/neb use.  Continue Symbicort 2 puffs Twice daily as needed for shortness of breath or wheezing. Brush tongue and rinse mouth afterwards   Let us know if you end up moving forward with the back surgery and we'll get you back in for preoperative evaluation if it's been over 60 days since we last saw you   Follow up in one year with Dr Belia Heman or Florentina Addison Susi Goslin,NP. If symptoms do not improve or worsen, please contact office for sooner follow up or seek emergency care.

## 2023-07-13 NOTE — Progress Notes (Signed)
 @Patient  ID: Jacob Herrera, male    DOB: 06-Feb-1951, 73 y.o.   MRN: [id number]  Chief Complaint  Patient presents with   Follow-up    Wearing CPAP nightly. No problems with mask or pressure.     Referring provider: Malva Limes, MD  HPI: 73 year old male, former smoker followed for OSA on BiPAP and asthma.  He is a patient of Dr. Evlyn Courier and last seen in office 11/22/2022 by [facility] NP.  Past medical history significant for CHF, back pain, hypertension, DM2, hypothyroid, CKD, neuropathy, scoliosis.  TEST/EVENTS:  06/05/2013 PFT: FVC 64, FEV1 65, ratio 84, TLC 64, DLCO corrected for alveolar volume 122. No BD 05/06/2015 PSG: AHI 131.8, SpO2 low 61.9% 02/10/2021 BiPAP titration: Optimal settings 16/10 cmH2O  08/08/2022: OV with Dr. Craige Cotta.  Previously seen by Dr. Belia Heman.  Diagnostic study in 2017 that showed severe sleep apnea.  Eventually started on BiPAP.  Titration study in 2022 that showed BiPAP 16/10 cmH2O was adequate setting.  Current download shows that he is using auto BiPAP.  Has trouble with dry mouth and bloating.  Using a fullface mask.  Unable to tolerate nasal masks.  He was supposed to have his BiPAP changed to 20 over 10 cm of water but his current download shows his settings are max IPAP 25, min EPAP 12 and pressure support of 10.  He was supposed to be on oxygen at night with his BiPAP.  Has not been using this.  Compliant with BiPAP and reports benefit from therapy.  Current BiPAP is more than 75 years old.  Residual AHI remains elevated.  Will arrange for new ResMed auto BiPAP with IPAP max 22, EPAP min 5, pressure support 6.  Will arrange for overnight oximetry with new auto BiPAP and then determine if he should consider restarting 2 L supplemental O2.  11/22/2022: OV with Auren Valdes NP for follow-up after receiving new BiPAP machine.  Feels like it is working relatively well for him.  Not having any more difficulties with bloating or dry mouth.  He does still feel tired some days but  attributes this to some restless sleep at night.  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  Does feel like he receives benefit from using his BiPAP.  Not sure that he would be able to sleep without it.  Not noticing any significant leaks.  Wearing full facemask.  No morning headaches or drowsy driving.  No sleep parasomnia/paralysis.  Still not wearing oxygen at night with his BiPAP. 10/21/2022-11/19/2022: BiPAP auto IPAP max 22, EPAP min 5, PS 6 30/30 days; 100% > 4 h; average use 8 hr 14 min Pressure 95th IPAP 18.3, EPAP 12.3 Leaks 95th 1.1 AHI 7.7  07/13/2023: Today - follow up Patient presents today for follow-up.  Doing well on BiPAP.  Feels like he sleeps well with it.  Energy levels are good during the day for the most part.  Does have some days that he is more tired than others, which is usually due to restless sleep most nights he tends to sleep relatively well.  Does have some tossing and turning related to chronic back pain.  No issues with mask fit or leaks.  Wearing fullface mask.  No morning headaches, drowsy driving or sleep parasomnia/paralysis. Breathing has been doing well.  He has a rare use of ICS/LABA with Symbicort.  Has not used it in the last few months.  Never uses his rescue inhaler.  No issues with cough or wheezing. He is considering an extensive spine surgery to help with his chronic back pain.  No surgery date has been set yet.  06/12/2023-07/11/2023 BiPAP 22/10; PS 6 30/30 days; 100% >4 hr; average use 9 hours 41 minutes Pressure 95th IPAP 19.1, EPAP 13.1 Leaks 1.8 AHI 3.7  Allergies  Allergen Reactions   Chlorthalidone Other (See Comments)    D/c by dr. [name] for worsening renal function 03/2019   Spironolactone Other (See Comments)    gynecomastia    Immunization History  Administered Date(s) Administered   Fluad Quad(high Dose 65+) 01/21/2019   Fluad Trivalent(High Dose 65+) 12/18/2022    Influenza Split 02/26/2013, 01/12/2014   Influenza, High Dose Seasonal PF 02/09/2015, 03/07/2016, 03/20/2017, 02/13/2018, 01/30/2020   PFIZER Comirnaty(Gray Top)Covid-19 Tri-Sucrose Vaccine 06/17/2019, 07/08/2019   PFIZER(Purple Top)SARS-COV-2 Vaccination 06/17/2019, 07/08/2019, 01/30/2020   Pneumococcal Conjugate-13 01/16/2014   Pneumococcal Polysaccharide-23 07/07/2008, 03/07/2016   Respiratory Syncytial Virus Vaccine,Recomb Aduvanted(Arexvy) 01/17/2022   Td 01/05/2003   Tdap 07/07/2008   Zoster Recombinant(Shingrix) 09/28/2017, 12/21/2017    Past Medical History:  Diagnosis Date   Asthma    CHF (congestive heart failure) (HCC)    Chronic lower back pain    Complication of anesthesia    unknow "difficulty" after cardiac cath   Diabetes mellitus, type 2 (HCC)    History of chicken pox    Hx of skin cancer, basal cell    Hypertension    Hypothyroidism    Kidney disease    stage 3   Neuropathy    feet   Scoliosis    Sleep apnea    BiPAP   Thyrotoxicosis    2010    Tobacco History: Social History   Tobacco Use  Smoking Status Former   Current packs/day: 0.00   Average packs/day: 1 pack/day for 25.0 years (25.0 ttl pk-yrs)   Types: Cigarettes   Start date: 05/30/1963   Quit date: 05/29/1988   Years since quitting: 35.1  Smokeless Tobacco Current   Types: Chew  Tobacco Comments   chews 1 bag of loose leaf chew/week   Ready to quit: Not Answered Counseling given: Not Answered Tobacco comments: chews 1 bag of loose leaf chew/week   Outpatient Medications Prior to Visit  Medication Sig Dispense Refill   acetaminophen (TYLENOL) 500 MG tablet Take 500 mg by mouth every 6 (six) hours as needed.     albuterol (PROVENTIL) (2.5 MG/3ML) 0.083% nebulizer solution Take 3 mLs (2.5 mg total) by nebulization every 4 (four) hours. And PRN (Patient taking differently: Take 2.5 mg by nebulization as needed.) 75 mL 12   allopurinol (ZYLOPRIM) 100 MG tablet Take by mouth.     aspirin  81 MG tablet Take 81 mg by mouth daily.     bumetanide (BUMEX) 2 MG tablet Take 2 mg by mouth 2 (two) times daily.     carvedilol (COREG) 25 MG tablet TAKE 1 TABLET BY MOUTH TWICE DAILY 180 tablet 4   cetirizine (ZYRTEC) 10 MG tablet Take 10 mg by mouth daily.     Continuous Glucose Sensor (FREESTYLE LIBRE 3 PLUS SENSOR) MISC Change sensor every 15 days. 6 each 3   Cyanocobalamin (VITAMIN B 12 PO) Take 1,000 mcg by mouth daily.      dapagliflozin propanediol ([name]) 10 MG TABS tablet Take 1 tablet (10 mg total) by mouth daily. 90 tablet 3   Dulaglutide (TRULICITY) 1.5 MG/0.5ML SOAJ Inject 1.5 mg into the skin  once a week. 8 mL 3   erythromycin ophthalmic ointment Apply 1 a small amount into right eye every night     finasteride (PROSCAR) 5 MG tablet Take 1 tablet (5 mg total) by mouth daily. 90 tablet 1   gabapentin (NEURONTIN) 600 MG tablet TAKE 1 TABLET BY MOUTH 2 TIMES DAILY 180 tablet 4   glucose blood (ONETOUCH ULTRA) test strip USE AS DIRECTED THREE TIMES A DAY 100 each 3   hydrALAZINE (APRESOLINE) 100 MG tablet TAKE 1 TABLET BY MOUTH THREE TIMES DAILY 270 tablet 4   Insulin Glargine (BASAGLAR KWIKPEN) 100 UNIT/ML Inject 21-25 units into the skin daily. Adjust as directed by PCP. 30 mL 4   insulin lispro (HUMALOG KWIKPEN) 100 UNIT/ML KwikPen Inject 6-10 Units into the skin 3 (three) times daily. Sliding scale. 45 mL 3   Insulin Pen Needle (BD PEN NEEDLE NANO U/F) 32G X 4 MM MISC USE EVERY DAY AS DIRECTED 100 each 3   isosorbide mononitrate (IMDUR) 60 MG 24 hr tablet TAKE 1 TABLET BY MOUTH DAILY 90 tablet 4   levothyroxine (SYNTHROID) 125 MCG tablet TAKE 1 TABLET EVERY DAY ON EMPTY STOMACHWITH A GLASS OF WATER AT LEAST 30-60 MINBEFORE BREAKFAST 90 tablet 3   losartan (COZAAR) 100 MG tablet Take 1 tablet (100 mg total) by mouth daily. 30 tablet 1   Lysine 500 MG CAPS Take 1 capsule by mouth daily.     metolazone (ZAROXOLYN) 5 MG tablet Take 5 mg by mouth 3 (three) times a week.      pantoprazole (PROTONIX) 40 MG tablet TAKE ONE TABLET BY MOUTH DAILY BEFORE BREAKFAST 30 tablet 0   rosuvastatin (CRESTOR) 20 MG tablet TAKE ONE TABLET BY MOUTH DAILY 90 tablet 3   senna (SENOKOT) 8.6 MG tablet Take by mouth.     SENNA PO Take 1 capsule by mouth in the morning, at noon, in the evening, and at bedtime.     terazosin (HYTRIN) 5 MG capsule TAKE 1 CAPSULE BY MOUTH DAILY. TAKE IN PLACE OF DOXAZOSIN. 90 capsule 4   traMADol (ULTRAM) 50 MG tablet Take by mouth.     VITAMIN D, CHOLECALCIFEROL, PO Take by mouth.     SYMBICORT 80-4.5 MCG/ACT inhaler INHALE 2 PUFFS INTO LUNGS TWICE DAILY 10.2 g 0   No facility-administered medications prior to visit.     Review of Systems:   Constitutional: No weight loss or gain, night sweats, fevers, chills, or lassitude. +fatigue (baseline) HEENT: No headaches, difficulty swallowing, tooth/dental problems, or sore throat. No sneezing, itching, ear ache, nasal congestion, or post nasal drip CV:  No chest pain, orthopnea, PND, swelling in lower extremities, anasarca, dizziness, palpitations, syncope Resp: No shortness of breath with exertion or at rest. No excess mucus or change in color of mucus. No productive or non-productive. No hemoptysis. No wheezing.  No chest wall deformity GI:  No heartburn, indigestion Skin: No rash, lesions, ulcerations MSK:  +chronic hip and back pain Neuro: No dizziness or lightheadedness.  Psych: No depression or anxiety. Mood stable.     Physical Exam:  BP 118/60 (BP Location: Left Arm, Patient Position: Sitting, Cuff Size: Normal)   Pulse 72   Temp 98 F (36.7 C) (Temporal)   Ht 5\' 8"  (1.727 m)   Wt 238 lb (108 kg)   SpO2 98%   BMI 36.19 kg/m   GEN: Pleasant, interactive, well-kempt; obese; in no acute distress. HEENT:  Normocephalic and atraumatic. PERRLA. Sclera white. Nasal turbinates pink, moist and  patent bilaterally. No rhinorrhea present. Oropharynx pink and moist, without exudate or edema. No  lesions, ulcerations, or postnasal drip. Mallampati II NECK:  Supple w/ fair ROM. Thyroid symmetrical with no goiter or nodules palpated. No lymphadenopathy.   CV: RRR, no m/r/g, no peripheral edema. Pulses intact, +2 bilaterally. No cyanosis, pallor or clubbing. PULMONARY:  Unlabored, regular breathing. Clear bilaterally A&P w/o wheezes/rales/rhonchi. No accessory muscle use.  GI: BS present and normoactive. Soft, non-tender to palpation. No organomegaly or masses detected.  MSK: No erythema, warmth or tenderness. Cap refil <2 sec all extrem. No deformities or joint swelling noted.  Neuro: A/Ox3. No focal deficits noted.   Skin: Warm, no lesions or rashe Psych: Normal affect and behavior. Judgement and thought content appropriate.     Lab Results:  CBC    Component Value Date/Time   WBC 5.9 12/09/2021 1524   WBC 8.0 07/08/2013 0555   RBC 3.73 (L) 12/09/2021 1524   RBC 3.57 (A) 11/08/2020 0000   HGB 11.2 (L) 12/09/2021 1524   HCT 34.8 (L) 12/09/2021 1524   PLT 239 12/09/2021 1524   MCV 93 12/09/2021 1524   MCV 93 07/08/2013 0555   MCH 30.0 12/09/2021 1524   MCH 30.0 07/08/2013 0555   MCHC 32.2 12/09/2021 1524   MCHC 32.4 07/08/2013 0555   RDW 12.7 12/09/2021 1524   RDW 14.2 07/08/2013 0555   LYMPHSABS 2.5 07/08/2013 0555   MONOABS 0.4 07/08/2013 0555   EOSABS 0.2 07/08/2013 0555   BASOSABS 0.1 07/08/2013 0555    BMET    Component Value Date/Time   NA 137 04/14/2022 1526   NA 136 07/08/2013 0555   K 4.9 04/14/2022 1526   K 4.0 07/08/2013 0555   CL 101 04/14/2022 1526   CL 102 07/08/2013 0555   CO2 22 04/14/2022 1526   CO2 31 07/08/2013 0555   GLUCOSE 152 (H) 04/14/2022 1526   GLUCOSE 152 (H) 04/01/2015 0906   GLUCOSE 175 (H) 07/08/2013 0555   BUN 52 (H) 04/14/2022 1526   BUN 20 (H) 07/08/2013 0555   CREATININE 2.19 (H) 04/14/2022 1526   CREATININE 1.36 (H) 07/08/2013 0555   CALCIUM 10.4 (H) 04/14/2022 1526   CALCIUM 9.3 07/08/2013 0555   GFRNONAA 33 11/08/2020  0000   GFRNONAA 54 (L) 07/08/2013 0555   GFRAA 42 (L) 10/07/2018 0825   GFRAA >60 07/08/2013 0555    BNP    Component Value Date/Time   BNP 43.9 12/18/2022 1501     Imaging:  No results found.  Administration History     None           No data to display          No results found for: "NITRICOXIDE"      Assessment & Plan:   OSA treated with BiPAP Very severe OSA on BiPAP.  Excellent compliance and control.  Receives benefit from use.  Understands proper care/use of device.  Aware of risks of untreated sleep apnea.  Healthy weight loss encouraged.  Safe driving practices reviewed.  Patient Instructions  Continue to use BiPAP every night, minimum of 4-6 hours a night.  Change equipment every 30 days or as directed by DME. Wash your tubing with warm soap and water daily, hang to dry. Wash humidifier portion weekly. Use bottled, distilled water and change daily  Be aware of reduced alertness and do not drive or operate heavy machinery if experiencing this or drowsiness.  Exercise encouraged, as tolerated. Notify if persistent daytime  sleepiness occurs even with consistent use of CPAP.  Continue Albuterol inhaler 2 puffs or 3 mL neb every 6 hours as needed for shortness of breath or wheezing. Notify if symptoms persist despite rescue inhaler/neb use.  Continue Symbicort 2 puffs Twice daily as needed for shortness of breath or wheezing. Brush tongue and rinse mouth afterwards   Let us know if you end up moving forward with the back surgery and we'll get you back in for preoperative evaluation if it's been over 60 days since we last saw you   Follow up in one year with Dr Belia Heman or Florentina Addison Carmela Piechowski,NP. If symptoms do not improve or worsen, please contact office for sooner follow up or seek emergency care.    Asthma, chronic Intermittent asthma.  Stable on current regimen.  Rare use of ICS/LABA.  No recent exacerbations.   I spent 25 minutes of dedicated to the care of  this patient on the date of this encounter to include pre-visit review of records, face-to-face time with the patient discussing conditions above, post visit ordering of testing, clinical documentation with the electronic health record, making appropriate referrals as documented, and communicating necessary findings to members of the patients care team.  Noemi Chapel, NP 07/13/2023  Pt aware and understands NP's role.

## 2023-07-17 ENCOUNTER — Other Ambulatory Visit: Payer: Self-pay | Admitting: Family Medicine

## 2023-07-17 ENCOUNTER — Other Ambulatory Visit (HOSPITAL_COMMUNITY): Payer: Self-pay

## 2023-07-17 ENCOUNTER — Telehealth: Payer: Self-pay

## 2023-07-17 DIAGNOSIS — G629 Polyneuropathy, unspecified: Secondary | ICD-10-CM

## 2023-07-17 DIAGNOSIS — J4520 Mild intermittent asthma, uncomplicated: Secondary | ICD-10-CM

## 2023-07-17 NOTE — Telephone Encounter (Signed)
*  Pulm  Pharmacy Patient Advocate Encounter   Received notification from CoverMyMeds that prior authorization for Symbicort 80-4.5MCG/ACT aerosol  is required/requested.   Insurance verification completed.   The patient is insured through [facility] ADVANTAGE/RX ADVANCE .   Per test claim: PA required; PA submitted to above mentioned insurance via CoverMyMeds Key/confirmation #/EOC [id number] Status is pending

## 2023-07-18 ENCOUNTER — Other Ambulatory Visit (HOSPITAL_COMMUNITY): Payer: Self-pay

## 2023-07-18 NOTE — Telephone Encounter (Signed)
 Preferred alternatives covered are as follows:   Breyna 10.3gm- $100.00 Generic Advair Diskus- $5.00 Advair HFA- $47.00  Fax received for additional information on why the patient cannot take any of these alternatives.   Form for more information attached in patients media.

## 2023-07-19 ENCOUNTER — Other Ambulatory Visit: Payer: Self-pay

## 2023-07-19 NOTE — Telephone Encounter (Signed)
 Requested medication (s) are due for refill today: yes  Requested medication (s) are on the active medication list: yes  Last refill:  05/01/22 #180 4 RF  Future visit scheduled: yes  Notes to clinic:  overdue lab work   Requested Prescriptions  Pending Prescriptions Disp Refills   gabapentin (NEURONTIN) 600 MG tablet [Pharmacy Med Name: GABAPENTIN 600 MG TAB] 180 tablet 4    Sig: TAKE 1 TABLET BY MOUTH 2 TIMES DAILY     Neurology: Anticonvulsants - gabapentin Failed - 07/19/2023 10:44 AM      Failed - Cr in normal range and within 360 days    Creatinine  Date Value Ref Range Status  07/08/2013 1.36 (H) 0.60 - 1.30 mg/dL Final   Creatinine, Ser  Date Value Ref Range Status  04/14/2022 2.19 (H) 0.76 - 1.27 mg/dL Final         Passed - Completed PHQ-2 or PHQ-9 in the last 360 days      Passed - Valid encounter within last 12 months    Recent Outpatient Visits           1 week ago Diabetes mellitus with nephropathy (HCC)   [facility] Malva Limes, MD       Future Appointments             In 3 months [name], Demetrios Isaacs, MD [facility]   In 4 months Stoioff, Verna Czech, MD [facility] [place]

## 2023-07-19 NOTE — Patient Outreach (Addendum)
 Care Management   Visit Note  07/19/2023 Name: Jacob Herrera MRN: [id number] DOB: June 28, 1950  Subjective: Jacob Herrera is a 73 y.o. year old male who is a primary care patient of [name], Demetrios Isaacs, MD. Engaged with Jacob Herrera via telephone.   Assessment:  Review of patient past medical history, allergies, medications, health status, including review of consultants reports, laboratory and other test data, was performed as part of  evaluation and provision of care management services.   Outpatient Encounter Medications as of 07/19/2023  Medication Sig Note   bumetanide (BUMEX) 2 MG tablet Take 2 mg by mouth 2 (two) times daily.    hydrALAZINE (APRESOLINE) 100 MG tablet TAKE 1 TABLET BY MOUTH THREE TIMES DAILY 07/19/2023: Reports being instructed to only take two if blood pressures are lower than baseling   isosorbide mononitrate (IMDUR) 60 MG 24 hr tablet TAKE 1 TABLET BY MOUTH DAILY    levothyroxine (SYNTHROID) 125 MCG tablet TAKE 1 TABLET EVERY DAY ON EMPTY STOMACHWITH A GLASS OF WATER AT LEAST 30-60 MINBEFORE BREAKFAST    losartan (COZAAR) 100 MG tablet Take 1 tablet (100 mg total) by mouth daily.    metolazone (ZAROXOLYN) 5 MG tablet Take 5 mg by mouth 3 (three) times a week.    rosuvastatin (CRESTOR) 20 MG tablet TAKE ONE TABLET BY MOUTH DAILY    terazosin (HYTRIN) 5 MG capsule TAKE 1 CAPSULE BY MOUTH DAILY. TAKE IN PLACE OF DOXAZOSIN.    acetaminophen (TYLENOL) 500 MG tablet Take 500 mg by mouth every 6 (six) hours as needed.    albuterol (PROVENTIL) (2.5 MG/3ML) 0.083% nebulizer solution Take 3 mLs (2.5 mg total) by nebulization every 4 (four) hours. And PRN (Patient taking differently: Take 2.5 mg by nebulization as needed.) 02/26/2023: Reports using as needed   allopurinol (ZYLOPRIM) 100 MG tablet Take by mouth.    aspirin 81 MG tablet Take 81 mg by mouth daily.    carvedilol (COREG) 25 MG tablet TAKE 1 TABLET BY MOUTH TWICE DAILY    cetirizine (ZYRTEC) 10 MG tablet Take 10 mg  by mouth daily.    Continuous Glucose Sensor (FREESTYLE LIBRE 3 PLUS SENSOR) MISC Change sensor every 15 days.    Cyanocobalamin (VITAMIN B 12 PO) Take 1,000 mcg by mouth daily.     dapagliflozin propanediol ([name]) 10 MG TABS tablet Take 1 tablet (10 mg total) by mouth daily.    Dulaglutide (TRULICITY) 1.5 MG/0.5ML SOAJ Inject 1.5 mg into the skin once a week.    erythromycin ophthalmic ointment Apply 1 a small amount into right eye every night    finasteride (PROSCAR) 5 MG tablet Take 1 tablet (5 mg total) by mouth daily.    gabapentin (NEURONTIN) 600 MG tablet TAKE 1 TABLET BY MOUTH 2 TIMES DAILY    glucose blood (ONETOUCH ULTRA) test strip USE AS DIRECTED THREE TIMES A DAY    Insulin Glargine (BASAGLAR KWIKPEN) 100 UNIT/ML Inject 21-25 units into the skin daily. Adjust as directed by PCP. 07/19/2023: Reports taking 15 units in the morning and 15 units in the evening   insulin lispro (HUMALOG KWIKPEN) 100 UNIT/ML KwikPen Inject 6-10 Units into the skin 3 (three) times daily. Sliding scale.    Insulin Pen Needle (BD PEN NEEDLE NANO U/F) 32G X 4 MM MISC USE EVERY DAY AS DIRECTED    Lysine 500 MG CAPS Take 1 capsule by mouth daily.    pantoprazole (PROTONIX) 40 MG tablet TAKE ONE TABLET BY MOUTH DAILY BEFORE BREAKFAST  02/26/2023: Reports only using as needed   senna (SENOKOT) 8.6 MG tablet Take by mouth.    SENNA PO Take 1 capsule by mouth in the morning, at noon, in the evening, and at bedtime.    SYMBICORT 80-4.5 MCG/ACT inhaler Inhale 2 puffs into the lungs 2 (two) times daily as needed (shortness of breath or wheezing).    traMADol (ULTRAM) 50 MG tablet Take by mouth.    VITAMIN D, CHOLECALCIFEROL, PO Take by mouth.    No facility-administered encounter medications on file as of 07/19/2023.     Interventions: Interventions Today    Flowsheet Row Most Recent Value  Chronic Disease   Chronic disease during today's visit Diabetes, Congestive Heart Failure (CHF), Hypertension (HTN),  Other, Chronic Kidney Disease/End Stage Renal Disease (ESRD)  [Asthma]  General Interventions   General Interventions Discussed/Reviewed General Interventions Reviewed, Annual Eye Exam, Labs, Annual Foot Exam, Lipid Profile, Doctor Visits  Labs Hgb A1c every 6 months  Doctor Visits Discussed/Reviewed Doctor Visits Reviewed, Specialist  PCP/Specialist Visits Compliance with follow-up visit  [Pending Eye Exam o 07/29/23. Will complete follow up with Nephrologist/Dr. Thedore Herrera on 07/30/23.]  Exercise Interventions   Exercise Discussed/Reviewed Assistive device use and maintanence  Education Interventions   Education Provided Provided Education  Provided Verbal Education On Nutrition, Blood Sugar Monitoring, Foot Care, Eye Care, Labs, Medication, When to see the doctor  Labs Reviewed Hgb A1c, Kidney Function, Lipid Profile  Nutrition Interventions   Nutrition Discussed/Reviewed Nutrition Reviewed  Pharmacy Interventions   Pharmacy Dicussed/Reviewed Pharmacy Topics Reviewed, Medications and their functions  Safety Interventions   Safety Discussed/Reviewed Fall Risk        PLAN Jacob Herrera agreed to follow up outreach next month    Jacob Herrera [facility] RN Care Manager Direct Dial: [phone]  Fax: [phone] Website: [email or web address]

## 2023-07-19 NOTE — Patient Instructions (Addendum)
 Thank you for allowing the  Care Management team to participate in your care. It was great speaking with you today!   Reminders: -Please remember to check your blood pressures daily and notify your provider if the readings are outside of the established range. -Please remember to attend your scheduled follow up with the Nephrology team/Dr. Thedore Mins on July 30, 2023. -We will follow up on August 20, 2023 at 1pm.  Please do not hesitate to contact me if you require assistance prior to our next outreach.    Juanell Fairly [facility] Population Health RN Care Manager Direct Dial: [phone]  Fax: [phone] Website: [email or web address]

## 2023-07-20 ENCOUNTER — Telehealth: Payer: Self-pay | Admitting: Nurse Practitioner

## 2023-07-20 MED ORDER — AIRSUPRA 90-80 MCG/ACT IN AERO: 1.0000 | INHALATION_SPRAY | Freq: Four times a day (QID) | RESPIRATORY_TRACT | 2 refills | Status: AC | PRN

## 2023-07-20 NOTE — Telephone Encounter (Signed)
 I spoke with the patient. He still has multiple Symbicort inhalers left. He is only using it as needed. He will continue to use the Symbicort as needed.  Nothing further needed.

## 2023-07-20 NOTE — Telephone Encounter (Signed)
 I have no idea about a referral? He was considering back surgery but he already has a physician for this. We had just discussed to notify us whenever he schedules so we can do preoperative eval/surgical risk assessment

## 2023-07-20 NOTE — Addendum Note (Signed)
 Addended by: Noemi Chapel on: 07/20/2023 01:48 PM   Modules accepted: Orders

## 2023-07-20 NOTE — Telephone Encounter (Signed)
 Copied from CRM [id number]. Topic: General - Other >> Jul 19, 2023  5:01 PM Para March B wrote: Reason for CRM: Mr Janene Madeira, Health Team Advance, would like a call from [place]'s office regarding a referral at [phone]

## 2023-07-20 NOTE — Telephone Encounter (Signed)
 Jacob Addison do you know what referral this patient is talking about

## 2023-07-23 DIAGNOSIS — N2581 Secondary hyperparathyroidism of renal origin: Secondary | ICD-10-CM | POA: Diagnosis not present

## 2023-07-23 DIAGNOSIS — N1832 Chronic kidney disease, stage 3b: Secondary | ICD-10-CM | POA: Diagnosis not present

## 2023-07-23 DIAGNOSIS — I10 Essential (primary) hypertension: Secondary | ICD-10-CM | POA: Diagnosis not present

## 2023-07-23 DIAGNOSIS — R809 Proteinuria, unspecified: Secondary | ICD-10-CM | POA: Diagnosis not present

## 2023-07-23 DIAGNOSIS — E790 Hyperuricemia without signs of inflammatory arthritis and tophaceous disease: Secondary | ICD-10-CM | POA: Diagnosis not present

## 2023-07-23 DIAGNOSIS — R600 Localized edema: Secondary | ICD-10-CM | POA: Diagnosis not present

## 2023-07-23 DIAGNOSIS — D631 Anemia in chronic kidney disease: Secondary | ICD-10-CM | POA: Diagnosis not present

## 2023-07-23 DIAGNOSIS — E1122 Type 2 diabetes mellitus with diabetic chronic kidney disease: Secondary | ICD-10-CM | POA: Diagnosis not present

## 2023-07-23 LAB — MICROALBUMIN / CREATININE URINE RATIO: Microalb Creat Ratio: 174

## 2023-07-23 LAB — PROTEIN / CREATININE RATIO, URINE
Albumin, U: 22.3
Creatinine, Urine: 128

## 2023-07-26 DIAGNOSIS — G4733 Obstructive sleep apnea (adult) (pediatric): Secondary | ICD-10-CM | POA: Diagnosis not present

## 2023-07-30 DIAGNOSIS — E1122 Type 2 diabetes mellitus with diabetic chronic kidney disease: Secondary | ICD-10-CM | POA: Diagnosis not present

## 2023-07-30 DIAGNOSIS — N2581 Secondary hyperparathyroidism of renal origin: Secondary | ICD-10-CM | POA: Diagnosis not present

## 2023-07-30 DIAGNOSIS — N1832 Chronic kidney disease, stage 3b: Secondary | ICD-10-CM | POA: Diagnosis not present

## 2023-07-30 DIAGNOSIS — E790 Hyperuricemia without signs of inflammatory arthritis and tophaceous disease: Secondary | ICD-10-CM | POA: Diagnosis not present

## 2023-07-30 DIAGNOSIS — I10 Essential (primary) hypertension: Secondary | ICD-10-CM | POA: Diagnosis not present

## 2023-07-30 DIAGNOSIS — R600 Localized edema: Secondary | ICD-10-CM | POA: Diagnosis not present

## 2023-07-30 DIAGNOSIS — N179 Acute kidney failure, unspecified: Secondary | ICD-10-CM | POA: Diagnosis not present

## 2023-08-20 ENCOUNTER — Other Ambulatory Visit: Payer: Self-pay

## 2023-08-20 ENCOUNTER — Telehealth: Payer: Self-pay | Admitting: Family Medicine

## 2023-08-20 NOTE — Telephone Encounter (Signed)
 error

## 2023-08-20 NOTE — Patient Instructions (Signed)
 Thank you for allowing the Complex Care Management team to participate in your care.

## 2023-08-20 NOTE — Patient Outreach (Unsigned)
 Complex Care Management   Visit Note  08/20/2023  Name:  Jacob Herrera MRN: [id number] DOB: April 12, 1951  Situation: Referral received for Complex Care Management related to {Criteria:32550} I obtained verbal consent from {CHL AMB Patient/Caregiver:[id number]}.  Visit completed with ***  {VISIT LOCATION:32553}  Background:   Past Medical History:  Diagnosis Date   Asthma    CHF (congestive heart failure) (HCC)    Chronic lower back pain    Complication of anesthesia    unknow "difficulty" after cardiac cath   Diabetes mellitus, type 2 (HCC)    History of chicken pox    Hx of skin cancer, basal cell    Hypertension    Hypothyroidism    Kidney disease    stage 3   Neuropathy    feet   Scoliosis    Sleep apnea    BiPAP   Thyrotoxicosis    2010    Assessment: Patient Reported Symptoms:  Cognitive        Neurological      HEENT        Cardiovascular      Respiratory      Endocrine      Gastrointestinal        Genitourinary      Integumentary      Musculoskeletal          Psychosocial              07/09/2023    2:06 PM  Depression screen PHQ 2/9  Decreased Interest 0  Down, Depressed, Hopeless 0  PHQ - 2 Score 0    Vitals:   08/20/23 1300  BP: 124/65    Medications Reviewed Today     Reviewed by Roxie Cord, RN (Registered Nurse) on 08/20/23 at 2328  Med List Status: <None>   Medication Order Taking? Sig Documenting Provider Last Dose Status Informant  acetaminophen  (TYLENOL ) 500 MG tablet [id number] No Take 500 mg by mouth every 6 (six) hours as needed. [provider] Taking Active   albuterol  (PROVENTIL ) (2.5 MG/3ML) 0.083% nebulizer solution [id number] No Take 3 mLs (2.5 mg total) by nebulization every 4 (four) hours. And PRN  Patient taking differently: Take 2.5 mg by nebulization as needed.   Kasa, Kurian, MD Taking Active            Med Note Dessa Floss, Landrum Carbonell N   Mon Feb 26, 2023  2:02 PM) Reports using as needed   Albuterol -Budesonide  (AIRSUPRA ) 90-80 MCG/ACT AERO [id number]  Inhale 1-2 puffs into the lungs every 6 (six) hours as needed (shortness of breath or wheezing). Roetta Clarke, NP  Active   allopurinol (ZYLOPRIM) 100 MG tablet [id number] No Take by mouth. [provider] Taking Active   aspirin  81 MG tablet [id number] No Take 81 mg by mouth daily. [provider] Taking Active   bumetanide (BUMEX) 2 MG tablet [id number] No Take 2 mg by mouth 2 (two) times daily. [provider] Taking Active   carvedilol  (COREG ) 25 MG tablet [id number] No TAKE 1 TABLET BY MOUTH TWICE DAILY [name], Erlinda Haws, MD Taking Active   cetirizine (ZYRTEC) 10 MG tablet [id number] No Take 10 mg by mouth daily. [provider] Taking Active   Continuous Glucose Sensor (FREESTYLE LIBRE 3 PLUS SENSOR) MISC [id number] No Change sensor every 15 days. Lamon Pillow, MD Taking Active   Cyanocobalamin (VITAMIN B 12 PO) [id number] No Take 1,000 mcg by mouth daily.  [provider] Taking Active Self  dapagliflozin  propanediol (  [name] ) 10 MG TABS tablet [id number] No Take 1 tablet (10 mg total) by mouth daily. Lamon Pillow, MD Taking Active   Dulaglutide  (TRULICITY ) 1.5 MG/0.5ML [place]land [id number] No Inject 1.5 mg into the skin once a week. Lamon Pillow, MD Taking Active   erythromycin ophthalmic ointment [id number] No Apply 1 a small amount into right eye every night [provider] Taking Active   finasteride  (PROSCAR ) 5 MG tablet [id number] No Take 1 tablet (5 mg total) by mouth daily. Lamon Pillow, MD Taking Active   gabapentin  (NEURONTIN ) 600 MG tablet [id number]  TAKE 1 TABLET BY MOUTH 2 TIMES DAILY Lamon Pillow, MD  Active   glucose blood [facility] ULTRA) test strip [id number] No USE AS DIRECTED THREE TIMES A DAY Lamon Pillow, MD Taking Active   hydrALAZINE  (APRESOLINE ) 100 MG tablet [id number] No TAKE 1 TABLET BY MOUTH THREE TIMES DAILY Lamon Pillow, MD Taking  Active            Med Note [facility], Zea Kostka N   Mon Aug 20, 2023  1:28 PM) Reports dose was reduced by Nephrologist to 50 mg three times a day.  Insulin  Glargine (BASAGLAR  KWIKPEN) 100 UNIT/ML [id number] No Inject 21-25 units into the skin daily. Adjust as directed by PCP. Lamon Pillow, MD Taking Active            Med Note [facility], Toia Micale N   Thu Jul 19, 2023  1:15 PM) Reports taking 15 units in the morning and 15 units in the evening  insulin  lispro (HUMALOG  KWIKPEN) 100 UNIT/ML KwikPen [id number] No Inject 6-10 Units into the skin 3 (three) times daily. Sliding scale. Lamon Pillow, MD Taking Active   Insulin  Pen Needle (BD PEN NEEDLE NANO U/F) 32G X 4 MM MISC [id number] No USE EVERY DAY AS DIRECTED Lamon Pillow, MD Taking Active   isosorbide mononitrate (IMDUR) 60 MG 24 hr tablet [id number] No TAKE 1 TABLET BY MOUTH DAILY Lamon Pillow, MD Taking Active   levothyroxine  (SYNTHROID ) 125 MCG tablet [id number] No TAKE 1 TABLET EVERY DAY ON EMPTY STOMACHWITH A GLASS OF WATER AT LEAST 30-60 MINBEFORE BREAKFAST Lamon Pillow, MD Taking Active   losartan  (COZAAR ) 100 MG tablet [id number] No Take 1 tablet (100 mg total) by mouth daily. Ronney Cola, MD Taking Active   Lysine 500 MG CAPS [id number] No Take 1 capsule by mouth daily. [provider] Taking Active   metolazone (ZAROXOLYN) 5 MG tablet [id number] No Take 5 mg by mouth 3 (three) times a week. [provider] Taking Active            Med Note Dessa Floss, Jahron Hunsinger N   Mon Aug 20, 2023  1:27 PM) Reports Nephrologist reduced dose to once a week.  pantoprazole  (PROTONIX ) 40 MG tablet [id number] No TAKE ONE TABLET BY MOUTH DAILY BEFORE Sarrah Cure, MD Taking Active            Med Note Dessa Floss, Maxamilian Amadon N   Mon Feb 26, 2023  2:09 PM) Reports only using as needed  rosuvastatin  (CRESTOR ) 20 MG tablet [id number] No TAKE ONE TABLET BY MOUTH DAILY Lamon Pillow, MD Taking Active   senna (SENOKOT) 8.6 MG tablet  [id number] No Take by mouth. [provider] Taking Active Self  SENNA PO [id number] No Take 1 capsule by mouth in the morning, at noon, in the evening, and at bedtime. [provider] Taking Active Self  terazosin  (HYTRIN ) 5 MG  capsule [id number] No TAKE 1 CAPSULE BY MOUTH DAILY. TAKE IN PLACE OF DOXAZOSIN . Lamon Pillow, MD Taking Active   traMADol [place] Sick) 50 MG tablet [id number] No Take by mouth. [provider] Taking Active   VITAMIN D , CHOLECALCIFEROL, PO [id number] No Take by mouth. [provider] Taking Active   Med List Note Mosetta Areola, RN 03/31/15 1507): UDS done 08-11-14 Meds to last until 10-09-14            Recommendation:   {RECOMMENDATONS:32554}  Follow Up Plan:   {FOLLOWUP:32559}  SIG ***

## 2023-08-21 DIAGNOSIS — N1832 Chronic kidney disease, stage 3b: Secondary | ICD-10-CM | POA: Diagnosis not present

## 2023-08-21 DIAGNOSIS — E1122 Type 2 diabetes mellitus with diabetic chronic kidney disease: Secondary | ICD-10-CM | POA: Diagnosis not present

## 2023-08-21 LAB — COMPREHENSIVE METABOLIC PANEL WITH GFR
Albumin: 4.2 (ref 3.5–5.0)
Calcium: 10.4 (ref 8.7–10.7)
eGFR: 21

## 2023-08-21 LAB — BASIC METABOLIC PANEL WITH GFR
BUN: 61 — AB (ref 4–21)
CO2: 25 — AB (ref 13–22)
Chloride: 99 (ref 99–108)
Creatinine: 3 — AB (ref 0.6–1.3)
Glucose: 150
Potassium: 3.6 meq/L (ref 3.5–5.1)
Sodium: 136 — AB (ref 137–147)

## 2023-08-23 DIAGNOSIS — H04123 Dry eye syndrome of bilateral lacrimal glands: Secondary | ICD-10-CM | POA: Diagnosis not present

## 2023-08-23 DIAGNOSIS — H35373 Puckering of macula, bilateral: Secondary | ICD-10-CM | POA: Diagnosis not present

## 2023-08-23 DIAGNOSIS — Z961 Presence of intraocular lens: Secondary | ICD-10-CM | POA: Diagnosis not present

## 2023-08-23 DIAGNOSIS — E119 Type 2 diabetes mellitus without complications: Secondary | ICD-10-CM | POA: Diagnosis not present

## 2023-08-23 LAB — HM DIABETES EYE EXAM

## 2023-08-25 DIAGNOSIS — G4733 Obstructive sleep apnea (adult) (pediatric): Secondary | ICD-10-CM | POA: Diagnosis not present

## 2023-08-27 DIAGNOSIS — E790 Hyperuricemia without signs of inflammatory arthritis and tophaceous disease: Secondary | ICD-10-CM | POA: Diagnosis not present

## 2023-08-27 DIAGNOSIS — N184 Chronic kidney disease, stage 4 (severe): Secondary | ICD-10-CM | POA: Diagnosis not present

## 2023-08-27 DIAGNOSIS — R801 Persistent proteinuria, unspecified: Secondary | ICD-10-CM | POA: Diagnosis not present

## 2023-08-27 DIAGNOSIS — E1122 Type 2 diabetes mellitus with diabetic chronic kidney disease: Secondary | ICD-10-CM | POA: Diagnosis not present

## 2023-08-27 DIAGNOSIS — N2581 Secondary hyperparathyroidism of renal origin: Secondary | ICD-10-CM | POA: Diagnosis not present

## 2023-08-27 DIAGNOSIS — I10 Essential (primary) hypertension: Secondary | ICD-10-CM | POA: Diagnosis not present

## 2023-08-31 ENCOUNTER — Other Ambulatory Visit: Payer: Self-pay | Admitting: Family Medicine

## 2023-09-03 NOTE — Telephone Encounter (Signed)
 Requested medications are due for refill today.  yes  Requested medications are on the active medications list.  yes  Last refill. 03/03/2022 #30 0 rf  Future visit scheduled.   yes  Notes to clinic.  Medication last prescribed in 2023.    Requested Prescriptions  Pending Prescriptions Disp Refills   pantoprazole  (PROTONIX ) 40 MG tablet [Pharmacy Med Name: PANTOPRAZOLE  SODIUM 40 MG DR TAB] 30 tablet 0    Sig: TAKE ONE TABLET BY MOUTH DAILY BEFORE BREAKFAST     Gastroenterology: Proton Pump Inhibitors Passed - 09/03/2023  3:00 PM      Passed - Valid encounter within last 12 months    Recent Outpatient Visits           1 month ago Diabetes mellitus with nephropathy [facility])   [facility] [name], Erlinda Haws, MD       Future Appointments             In 2 months [name], Erlinda Haws, MD [facility]   In 2 months Stoioff, Kizzie Perks, MD [facility] [place]

## 2023-09-25 DIAGNOSIS — G4733 Obstructive sleep apnea (adult) (pediatric): Secondary | ICD-10-CM | POA: Diagnosis not present

## 2023-09-26 DIAGNOSIS — I10 Essential (primary) hypertension: Secondary | ICD-10-CM | POA: Diagnosis not present

## 2023-09-26 DIAGNOSIS — I25118 Atherosclerotic heart disease of native coronary artery with other forms of angina pectoris: Secondary | ICD-10-CM | POA: Diagnosis not present

## 2023-09-26 DIAGNOSIS — E782 Mixed hyperlipidemia: Secondary | ICD-10-CM | POA: Diagnosis not present

## 2023-09-26 DIAGNOSIS — I5033 Acute on chronic diastolic (congestive) heart failure: Secondary | ICD-10-CM | POA: Diagnosis not present

## 2023-09-26 DIAGNOSIS — G4733 Obstructive sleep apnea (adult) (pediatric): Secondary | ICD-10-CM | POA: Diagnosis not present

## 2023-09-27 ENCOUNTER — Other Ambulatory Visit: Payer: Self-pay

## 2023-09-27 NOTE — Patient Instructions (Signed)
Thank you for allowing the Chronic Care Management team to participate in your care.  

## 2023-09-27 NOTE — Patient Outreach (Signed)
 Complex Care Management   Visit Note  09/27/2023  Name:  Jacob Herrera MRN: [id number] DOB: 1951/02/20  Situation: Referral received for Complex Care Management related to {Criteria:32550} I obtained verbal consent from {CHL AMB Patient/Caregiver:[id number]}.  Visit completed with ***  {VISIT LOCATION:32553}  Background:   Past Medical History:  Diagnosis Date   Asthma    CHF (congestive heart failure) (HCC)    Chronic lower back pain    Complication of anesthesia    unknow "difficulty" after cardiac cath   Diabetes mellitus, type 2 (HCC)    History of chicken pox    Hx of skin cancer, basal cell    Hypertension    Hypothyroidism    Kidney disease    stage 3   Neuropathy    feet   Scoliosis    Sleep apnea    BiPAP   Thyrotoxicosis    2010    Assessment: Patient Reported Symptoms:  Cognitive        Neurological      HEENT        Cardiovascular      Respiratory      Endocrine      Gastrointestinal        Genitourinary      Integumentary      Musculoskeletal          Psychosocial              08/20/2023    1:30 PM  Depression screen PHQ 2/9  Decreased Interest 0  Down, Depressed, Hopeless 0  PHQ - 2 Score 0    There were no vitals filed for this visit.  Medications Reviewed Today     Reviewed by Roxie Cord, RN (Registered Nurse) on 09/27/23 at 1249  Med List Status: <None>   Medication Order Taking? Sig Documenting Provider Last Dose Status Informant  acetaminophen  (TYLENOL ) 500 MG tablet [id number] No Take 500 mg by mouth every 6 (six) hours as needed. [provider] Taking Active   albuterol  (PROVENTIL ) (2.5 MG/3ML) 0.083% nebulizer solution [id number] No Take 3 mLs (2.5 mg total) by nebulization every 4 (four) hours. And PRN  Patient taking differently: Take 2.5 mg by nebulization as needed.   Kasa, Kurian, MD Taking Active            Med Note Dessa Floss, Omolola Mittman N   Mon Feb 26, 2023  2:02 PM) Reports using as needed   Albuterol -Budesonide  (AIRSUPRA ) 90-80 MCG/ACT AERO [id number]  Inhale 1-2 puffs into the lungs every 6 (six) hours as needed (shortness of breath or wheezing). Roetta Clarke, NP  Active   allopurinol (ZYLOPRIM) 100 MG tablet [id number] No Take by mouth. [provider] Taking Active   aspirin  81 MG tablet [id number] No Take 81 mg by mouth daily. [provider] Taking Active   bumetanide (BUMEX) 2 MG tablet [id number] No Take 2 mg by mouth 2 (two) times daily. [provider] Taking Active   carvedilol  (COREG ) 25 MG tablet [id number] No TAKE 1 TABLET BY MOUTH TWICE DAILY [name], Erlinda Haws, MD Taking Active   cetirizine (ZYRTEC) 10 MG tablet [id number] No Take 10 mg by mouth daily. [provider] Taking Active   Continuous Glucose Sensor (FREESTYLE LIBRE 3 PLUS SENSOR) MISC [id number] No Change sensor every 15 days. Lamon Pillow, MD Taking Active   Cyanocobalamin (VITAMIN B 12 PO) [id number] No Take 1,000 mcg by mouth daily.  [provider] Taking Active Self  dapagliflozin  propanediol ([name] ) 10  MG TABS tablet [id number] No Take 1 tablet (10 mg total) by mouth daily. Lamon Pillow, MD Taking Active   Dulaglutide  (TRULICITY ) 1.5 MG/0.5ML [place] [id number] No Inject 1.5 mg into the skin once a week. Lamon Pillow, MD Taking Active   erythromycin ophthalmic ointment [id number] No Apply 1 a small amount into right eye every night [provider] Taking Active   finasteride  (PROSCAR ) 5 MG tablet [id number] No Take 1 tablet (5 mg total) by mouth daily. Lamon Pillow, MD Taking Active   gabapentin  (NEURONTIN ) 600 MG tablet [id number]  TAKE 1 TABLET BY MOUTH 2 TIMES DAILY Lamon Pillow, MD  Active   glucose blood [facility] ULTRA) test strip [id number] No USE AS DIRECTED THREE TIMES A DAY Lamon Pillow, MD Taking Active   hydrALAZINE  (APRESOLINE ) 100 MG tablet [id number] No TAKE 1 TABLET BY MOUTH THREE TIMES DAILY Lamon Pillow, MD Taking  Active            Med Note [facility], Jonathyn Carothers N   Mon Aug 20, 2023  1:28 PM) Reports dose was reduced by Nephrologist to 50 mg three times a day.  Insulin  Glargine (BASAGLAR  KWIKPEN) 100 UNIT/ML [id number] No Inject 21-25 units into the skin daily. Adjust as directed by PCP. Lamon Pillow, MD Taking Active            Med Note [facility], Dawud Mays N   Thu Jul 19, 2023  1:15 PM) Reports taking 15 units in the morning and 15 units in the evening  insulin  lispro (HUMALOG  KWIKPEN) 100 UNIT/ML KwikPen [id number] No Inject 6-10 Units into the skin 3 (three) times daily. Sliding scale. Lamon Pillow, MD Taking Active   Insulin  Pen Needle (BD PEN NEEDLE NANO U/F) 32G X 4 MM MISC [id number] No USE EVERY DAY AS DIRECTED Lamon Pillow, MD Taking Active   isosorbide mononitrate (IMDUR) 60 MG 24 hr tablet [id number] No TAKE 1 TABLET BY MOUTH DAILY Lamon Pillow, MD Taking Active   levothyroxine  (SYNTHROID ) 125 MCG tablet [id number] No TAKE 1 TABLET EVERY DAY ON EMPTY STOMACHWITH A GLASS OF WATER AT LEAST 30-60 MINBEFORE BREAKFAST Lamon Pillow, MD Taking Active   losartan  (COZAAR ) 100 MG tablet [id number] No Take 1 tablet (100 mg total) by mouth daily. Ronney Cola, MD Taking Active   Lysine 500 MG CAPS [id number] No Take 1 capsule by mouth daily. [provider] Taking Active   metolazone (ZAROXOLYN) 5 MG tablet [id number] No Take 5 mg by mouth 3 (three) times a week. [provider] Taking Active            Med Note Dessa Floss, Eila Runyan N   Mon Aug 20, 2023  1:27 PM) Reports Nephrologist reduced dose to once a week.  pantoprazole  (PROTONIX ) 40 MG tablet [id number]  Take 1 tablet (40 mg total) by mouth daily. Lamon Pillow, MD  Active   rosuvastatin  (CRESTOR ) 20 MG tablet [id number] No TAKE ONE TABLET BY MOUTH DAILY Lamon Pillow, MD Taking Active   senna (SENOKOT) 8.6 MG tablet [id number] No Take by mouth. [provider] Taking Active Self  SENNA PO [id number] No Take 1 capsule by  mouth in the morning, at noon, in the evening, and at bedtime. [provider] Taking Active Self  terazosin  (HYTRIN ) 5 MG capsule [id number] No TAKE 1 CAPSULE BY MOUTH DAILY. TAKE IN PLACE OF DOXAZOSIN . Lamon Pillow, MD Taking Active   traMADol Marionette Sick) 50 MG tablet [id number] No Take  by mouth. [provider] Taking Active   VITAMIN D , CHOLECALCIFEROL, PO [id number] No Take by mouth. [provider] Taking Active   Med List Note Mosetta Areola, RN 03/31/15 1507): UDS done 08-11-14 Meds to last until 10-09-14            Recommendation:   {RECOMMENDATONS:32554}  Follow Up Plan:   {FOLLOWUP:32559}  SIG ***

## 2023-10-15 DIAGNOSIS — M65332 Trigger finger, left middle finger: Secondary | ICD-10-CM | POA: Diagnosis not present

## 2023-10-16 DIAGNOSIS — C44212 Basal cell carcinoma of skin of right ear and external auricular canal: Secondary | ICD-10-CM | POA: Diagnosis not present

## 2023-10-19 ENCOUNTER — Telehealth: Payer: Self-pay

## 2023-10-19 DIAGNOSIS — E1121 Type 2 diabetes mellitus with diabetic nephropathy: Secondary | ICD-10-CM

## 2023-10-19 NOTE — Telephone Encounter (Signed)
 Received request for a new prescription from [facility] on [name] .

## 2023-10-24 MED ORDER — DAPAGLIFLOZIN PROPANEDIOL 10 MG PO TABS
10.0000 mg | ORAL_TABLET | Freq: Every day | ORAL | 4 refills | Status: DC
Start: 2023-10-24 — End: 2023-10-29

## 2023-10-24 NOTE — Addendum Note (Signed)
 Addended by: GASPER GOLAS E on: 10/24/2023 12:12 PM   Modules accepted: Orders

## 2023-10-29 MED ORDER — DAPAGLIFLOZIN PROPANEDIOL 10 MG PO TABS
10.0000 mg | ORAL_TABLET | Freq: Every day | ORAL | 4 refills | Status: DC
Start: 2023-10-29 — End: 2024-02-22

## 2023-10-29 NOTE — Addendum Note (Signed)
 Addended by: GASPER NANCYANN BRAVO on: 10/29/2023 04:22 PM   Modules accepted: Orders

## 2023-11-09 ENCOUNTER — Ambulatory Visit: Admitting: Family Medicine

## 2023-11-09 DIAGNOSIS — M65332 Trigger finger, left middle finger: Secondary | ICD-10-CM | POA: Diagnosis not present

## 2023-11-15 ENCOUNTER — Telehealth: Payer: Self-pay

## 2023-11-16 ENCOUNTER — Ambulatory Visit (INDEPENDENT_AMBULATORY_CARE_PROVIDER_SITE_OTHER): Admitting: Family Medicine

## 2023-11-16 VITALS — BP 131/60 | HR 70 | Wt 238.0 lb

## 2023-11-16 DIAGNOSIS — E039 Hypothyroidism, unspecified: Secondary | ICD-10-CM

## 2023-11-16 DIAGNOSIS — Z125 Encounter for screening for malignant neoplasm of prostate: Secondary | ICD-10-CM

## 2023-11-16 DIAGNOSIS — E785 Hyperlipidemia, unspecified: Secondary | ICD-10-CM

## 2023-11-16 DIAGNOSIS — E1121 Type 2 diabetes mellitus with diabetic nephropathy: Secondary | ICD-10-CM

## 2023-11-16 DIAGNOSIS — I1A0 Resistant hypertension: Secondary | ICD-10-CM

## 2023-11-16 NOTE — Progress Notes (Signed)
 Established patient visit   Patient: Jacob Herrera   DOB: [birth year]   73 y.o. Male  MRN: [id number] Visit Date: 11/16/2023  Today's healthcare provider: Nancyann Perry, MD   Chief Complaint  Patient presents with   Medical Management of Chronic Issues    Follow-up Diabetes and Hypertension   Subjective    HPI Follow up htn, lipids, hypothyroid, and CKD. Feels well no complaints. Continues follow up renal for CKD. Tolerating Trulicity  well with no adverse effects.   Lab Results  Component Value Date   HGBA1C 6.4 (A) 07/09/2023   HGBA1C 6.4 (A) 03/19/2023   HGBA1C 6.3 (A) 12/18/2022   Lab Results  Component Value Date   NA 136 (A) 08/21/2023   K 3.6 08/21/2023   CREATININE 3.0 (A) 08/21/2023   EGFR 21 08/21/2023   GLUCOSE 152 (H) 04/14/2022   Lab Results  Component Value Date   CHOL 131 04/14/2022   HDL 37 (L) 04/14/2022   LDLCALC 50 04/14/2022   TRIG 285 (H) 04/14/2022   CHOLHDL 3.5 04/14/2022     Medications: Outpatient Medications Prior to Visit  Medication Sig   acetaminophen  (TYLENOL ) 500 MG tablet Take 500 mg by mouth every 6 (six) hours as needed.   albuterol  (PROVENTIL ) (2.5 MG/3ML) 0.083% nebulizer solution Take 3 mLs (2.5 mg total) by nebulization every 4 (four) hours. And PRN (Patient taking differently: Take 2.5 mg by nebulization as needed.)   Albuterol -Budesonide  (AIRSUPRA ) 90-80 MCG/ACT AERO Inhale 1-2 puffs into the lungs every 6 (six) hours as needed (shortness of breath or wheezing).   allopurinol (ZYLOPRIM) 100 MG tablet Take by mouth.   aspirin  81 MG tablet Take 81 mg by mouth daily.   bumetanide (BUMEX) 2 MG tablet Take 2 mg by mouth 2 (two) times daily.   carvedilol  (COREG ) 25 MG tablet TAKE 1 TABLET BY MOUTH TWICE DAILY   cetirizine (ZYRTEC) 10 MG tablet Take 10 mg by mouth daily.   Continuous Glucose Sensor (FREESTYLE LIBRE 3 PLUS SENSOR) MISC Change sensor every 15 days.   Cyanocobalamin (VITAMIN B 12 PO) Take 1,000 mcg by mouth  daily.    dapagliflozin  propanediol ([name] ) 10 MG TABS tablet Take 1 tablet (10 mg total) by mouth daily.   Dulaglutide  (TRULICITY ) 1.5 MG/0.5ML SOAJ Inject 1.5 mg into the skin once a week.   erythromycin ophthalmic ointment Apply 1 a small amount into right eye every night   finasteride  (PROSCAR ) 5 MG tablet Take 1 tablet (5 mg total) by mouth daily.   gabapentin  (NEURONTIN ) 600 MG tablet TAKE 1 TABLET BY MOUTH 2 TIMES DAILY   glucose blood (ONETOUCH ULTRA) test strip USE AS DIRECTED THREE TIMES A DAY   hydrALAZINE  (APRESOLINE ) 100 MG tablet TAKE 1 TABLET BY MOUTH THREE TIMES DAILY   Insulin  Glargine (BASAGLAR  KWIKPEN) 100 UNIT/ML Inject 21-25 units into the skin daily. Adjust as directed by PCP. (Patient taking differently: Inject 15 Units into the skin 2 (two) times daily.)   insulin  lispro (HUMALOG  KWIKPEN) 100 UNIT/ML KwikPen Inject 6-10 Units into the skin 3 (three) times daily. Sliding scale.   Insulin  Pen Needle (BD PEN NEEDLE NANO U/F) 32G X 4 MM MISC USE EVERY DAY AS DIRECTED   isosorbide mononitrate (IMDUR) 60 MG 24 hr tablet TAKE 1 TABLET BY MOUTH DAILY   levothyroxine  (SYNTHROID ) 125 MCG tablet TAKE 1 TABLET EVERY DAY ON EMPTY STOMACHWITH A GLASS OF WATER AT LEAST 30-60 MINBEFORE BREAKFAST   losartan  (COZAAR ) 100 MG tablet  Take 1 tablet (100 mg total) by mouth daily.   Lysine 500 MG CAPS Take 1 capsule by mouth daily.   metolazone (ZAROXOLYN) 5 MG tablet Take 5 mg by mouth 3 (three) times a week.   pantoprazole  (PROTONIX ) 40 MG tablet Take 1 tablet (40 mg total) by mouth daily.   rosuvastatin  (CRESTOR ) 20 MG tablet TAKE ONE TABLET BY MOUTH DAILY   senna (SENOKOT) 8.6 MG tablet Take by mouth.   SENNA PO Take 1 capsule by mouth in the morning, at noon, in the evening, and at bedtime.   terazosin  (HYTRIN ) 5 MG capsule TAKE 1 CAPSULE BY MOUTH DAILY. TAKE IN PLACE OF DOXAZOSIN .   traMADol (ULTRAM) 50 MG tablet Take by mouth.   VITAMIN D , CHOLECALCIFEROL, PO Take by mouth.   No  facility-administered medications prior to visit.    Review of Systems  Constitutional:  Negative for appetite change, chills and fever.  Respiratory:  Negative for chest tightness, shortness of breath and wheezing.   Cardiovascular:  Negative for chest pain and palpitations.  Gastrointestinal:  Negative for abdominal pain, nausea and vomiting.       Objective    BP 131/60 (BP Location: Left Arm, Patient Position: Sitting, Cuff Size: Large)   Pulse 70   Wt 238 lb (108 kg)   SpO2 99%   BMI 36.19 kg/m    Physical Exam   General: Appearance:    Mildly obese male in no acute distress  Eyes:    PERRL, conjunctiva/corneas clear, EOM's intact       Lungs:     Clear to auscultation bilaterally, respirations unlabored  Heart:    Normal heart rate. Normal rhythm.  2/6 systolic murmur   MS:   All extremities are intact.    Neurologic:   Awake, alert, oriented x 3. No apparent focal neurological defect.           Assessment & Plan     1. Diabetes mellitus with nephropathy (HCC) (Primary) Doing well on current medications regiment.  - Hemoglobin A1c  2. Hypothyroidism, unspecified type  - T4 AND TSH  3. Hyperlipidemia, unspecified hyperlipidemia type He is tolerating rosuvastatin  well with no adverse effects.   - Lipid panel  4. Prostate cancer screening  - PSA Total (Reflex To Free)  5. Morbid obesity (HCC) Continue Trulicity  which is tolerating well.   6. Resistant hypertension Fairly well controlled on current medication regiment.           Nancyann Perry, MD  [facility] [phone] (phone) [phone] (fax)  [facility]

## 2023-11-16 NOTE — Patient Instructions (Signed)
 [name] Kitchen  Please review the attached list of medications and notify my office if there are any errors.   . Please bring all of your medications to every appointment so we can make sure that our medication list is the same as yours.

## 2023-11-17 ENCOUNTER — Ambulatory Visit: Payer: Self-pay | Admitting: Family Medicine

## 2023-11-17 LAB — PSA TOTAL (REFLEX TO FREE): Prostate Specific Ag, Serum: 3.7 ng/mL (ref 0.0–4.0)

## 2023-11-17 LAB — LIPID PANEL
Chol/HDL Ratio: 4.4 ratio (ref 0.0–5.0)
Cholesterol, Total: 118 mg/dL (ref 100–199)
HDL: 27 mg/dL — ABNORMAL LOW (ref >39)
LDL Chol Calc (NIH): 25 mg/dL (ref 0–99)
Triglycerides: 480 mg/dL — ABNORMAL HIGH (ref 0–149)
VLDL Cholesterol Cal: 66 mg/dL — ABNORMAL HIGH (ref 5–40)

## 2023-11-17 LAB — T4 AND TSH
T4, Total: 8.4 ug/dL (ref 4.5–12.0)
TSH: 1.07 u[IU]/mL (ref 0.450–4.500)

## 2023-11-17 LAB — HEMOGLOBIN A1C
Est. average glucose Bld gHb Est-mCnc: 151 mg/dL
Hgb A1c MFr Bld: 6.9 % — ABNORMAL HIGH (ref 4.8–5.6)

## 2023-11-19 ENCOUNTER — Ambulatory Visit: Payer: PPO | Admitting: Urology

## 2023-11-21 ENCOUNTER — Ambulatory Visit: Payer: Self-pay | Admitting: Urology

## 2023-11-21 ENCOUNTER — Encounter: Payer: Self-pay | Admitting: Urology

## 2023-11-21 VITALS — BP 132/55 | HR 86 | Ht 68.0 in | Wt 238.0 lb

## 2023-11-21 DIAGNOSIS — N401 Enlarged prostate with lower urinary tract symptoms: Secondary | ICD-10-CM

## 2023-11-21 LAB — BLADDER SCAN AMB NON-IMAGING: Scan Result: 29

## 2023-11-21 MED ORDER — FINASTERIDE 5 MG PO TABS: 5.0000 mg | ORAL_TABLET | Freq: Every day | ORAL | 3 refills | Status: AC

## 2023-11-21 MED ORDER — TAMSULOSIN HCL 0.4 MG PO CAPS: 0.4000 mg | ORAL_CAPSULE | Freq: Every day | ORAL | 1 refills | Status: DC

## 2023-11-21 NOTE — Progress Notes (Signed)
 11/21/2023 9:09 PM   Jacob Herrera February 14, 1951 [id number]  Referring provider: Gasper Nancyann BRAVO, MD [address] [place],  [place] [address]   Urologic history:  1.  BPH with lower urinary tract symptoms -Combination therapy terazosin  5 mg/finasteride    HPI: 73 y.o. male presents for annual follow-up.  At last year's visit he was having worsening nighttime symptoms and started taking his terazosin  in the evening which resolved his nighttime/early morning symptoms.  He does note however starting in late afternoon in the evening he has increased urinary hesitancy, straining to urinate and decreased force and caliber of his urinary stream.  He has mild dysuria at times Denies gross hematuria No flank, abdominal or pelvic pain   PMH: Past Medical History:  Diagnosis Date   Asthma    CHF (congestive heart failure) (HCC)    Chronic lower back pain    Complication of anesthesia    unknow difficulty after cardiac cath   Diabetes mellitus, type 2 (HCC)    History of chicken pox    Hx of skin cancer, basal cell    Hypertension    Hypothyroidism    Kidney disease    stage 3   Neuropathy    feet   Scoliosis    Sleep apnea    BiPAP   Thyrotoxicosis    2010    Surgical History: Past Surgical History:  Procedure Laterality Date   CARDIAC CATHETERIZATION N/A 03/31/2015   Procedure: Left Heart Cath and Coronary Angiography;  Surgeon: Vinie DELENA Jude, MD;  Location: [facility] INVASIVE CV LAB;  Service: Cardiovascular;  Laterality: N/A;   CARDIAC CATHETERIZATION N/A 03/31/2015   Procedure: Coronary Stent Intervention;  Surgeon: Vinie DELENA Jude, MD;  Location: [facility] INVAS[facility] CV LAB;  Service: Cardiovascular;  Laterality: N/A;   CARDIAC CATHETERIZATION N/A 03/31/2015   Procedure: Coronary Stent Intervention;  Surgeon: Cara JONETTA Lovelace, MD;  Location: [facility] INVASIVE CV LAB;  Service: Cardiovascular;  Laterality: N/A;   CATARACT EXTRACTION W/PHACO Right 05/11/2020    Procedure: CATARACT EXTRACTION PHACO AND INTRAOCULAR LENS PLACEMENT (IOC) RIGHT DIABETIC 6.52 01:11.6 9.1%;  Surgeon: Mittie Gaskin, MD;  Location: [facility];  Service: Ophthalmology;  Laterality: Right;  Diabetic - insulin  sleep apnea   CATARACT EXTRACTION W/PHACO Left 06/02/2020   Procedure: CATARACT EXTRACTION PHACO AND INTRAOCULAR LENS PLACEMENT (IOC) LEFT DIABETIC;  Surgeon: Mittie Gaskin, MD;  Location: [facility];  Service: Ophthalmology;  Laterality: Left;  3.14 0:45.0 7.0%   COLONOSCOPY WITH PROPOFOL  N/A 04/04/2021   Procedure: COLONOSCOPY WITH PROPOFOL ;  Surgeon: Onita Elspeth Sharper, DO;  Location: [facility] END[facility]COPY;  Service: Gastroenterology;  Laterality: N/A;   ESOPHAGOGASTRODUODENOSCOPY (EGD) WITH PROPOFOL  N/A 04/04/2021   Procedure: ESOPHAGOGASTRODUODENOSCOPY (EGD) WITH PROPOFOL ;  Surgeon: Onita Elspeth Sharper, DO;  Location: [facility] ENDOSCOPY;  Service: Gastroenterology;  Laterality: N/A;  IDDM   MOHS SURGERY Left 05/12/2019   Left nares by Bette Bon, MD [facility]   NASAL SINUS SURGERY  2005   TONSILLECTOMY AND ADENOIDECTOMY  1950    Home Medications:  Allergies as of 11/21/2023       Reactions   Chlorthalidone  Other (See Comments)   D/c by dr. douglas for worsening renal function 03/2019   Spironolactone Other (See Comments)   gynecomastia        Medication List        Accurate as of November 21, 2023 11:59 PM. If you have any questions, ask your nurse or doctor.  acetaminophen  500 MG tablet Commonly known as: TYLENOL  Take 500 mg by mouth every 6 (six) hours as needed.   Airsupra  90-80 MCG/ACT Aero Generic drug: Albuterol -Budesonide  Inhale 1-2 puffs into the lungs every 6 (six) hours as needed (shortness of breath or wheezing).   albuterol  (2.5 MG/3ML) 0.083% nebulizer solution Commonly known as: PROVENTIL  Take 3 mLs (2.5 mg total) by nebulization every 4 (four) hours. And PRN What changed:  when to take  this reasons to take this additional instructions   allopurinol 100 MG tablet Commonly known as: ZYLOPRIM Take by mouth.   aspirin  81 MG tablet Take 81 mg by mouth daily.   Basaglar  KwikPen 100 UNIT/ML Inject 21-25 units into the skin daily. Adjust as directed by PCP. What changed:  how much to take how to take this when to take this additional instructions   BD Pen Needle Nano U/F 32G X 4 MM Misc Generic drug: Insulin  Pen Needle USE EVERY DAY AS DIRECTED   bumetanide 2 MG tablet Commonly known as: BUMEX Take 2 mg by mouth 2 (two) times daily.   carvedilol  25 MG tablet Commonly known as: COREG  TAKE 1 TABLET BY MOUTH TWICE DAILY   cetirizine 10 MG tablet Commonly known as: ZYRTEC Take 10 mg by mouth daily.   dapagliflozin  propanediol 10 MG Tabs tablet Commonly known as: [name]  Take 1 tablet (10 mg total) by mouth daily.   erythromycin ophthalmic ointment Apply 1 a small amount into right eye every night   finasteride  5 MG tablet Commonly known as: PROSCAR  Take 1 tablet (5 mg total) by mouth daily.   FreeStyle Libre 3 Plus Sensor Misc Change sensor every 15 days.   gabapentin  600 MG tablet Commonly known as: NEURONTIN  TAKE 1 TABLET BY MOUTH 2 TIMES DAILY   hydrALAZINE  50 MG tablet Commonly known as: APRESOLINE  Take 50 mg by mouth 3 (three) times daily. What changed: Another medication with the same name was removed. Continue taking this medication, and follow the directions you see here. Changed by: Glendia JAYSON Barba   insulin  lispro 100 UNIT/ML KwikPen Commonly known as: HumaLOG  KwikPen Inject 6-10 Units into the skin 3 (three) times daily. Sliding scale.   isosorbide mononitrate 60 MG 24 hr tablet Commonly known as: IMDUR TAKE 1 TABLET BY MOUTH DAILY   levothyroxine  125 MCG tablet Commonly known as: SYNTHROID  TAKE 1 TABLET EVERY DAY ON EMPTY STOMACHWITH A GLASS OF WATER AT LEAST 30-60 MINBEFORE BREAKFAST   losartan  100 MG tablet Commonly known  as: COZAAR  Take 1 tablet (100 mg total) by mouth daily.   Lysine 500 MG Caps Take 1 capsule by mouth daily.   metolazone 5 MG tablet Commonly known as: ZAROXOLYN Take 5 mg by mouth once a week.   OneTouch Ultra test strip Generic drug: glucose blood USE AS DIRECTED THREE TIMES A DAY   pantoprazole  40 MG tablet Commonly known as: PROTONIX  Take 1 tablet (40 mg total) by mouth daily.   rosuvastatin  20 MG tablet Commonly known as: CRESTOR  TAKE ONE TABLET BY MOUTH DAILY   senna 8.6 MG tablet Commonly known as: SENOKOT Take by mouth.   SENNA PO Take 1 capsule by mouth in the morning, at noon, in the evening, and at bedtime.   tamsulosin  0.4 MG Caps capsule Commonly known as: FLOMAX  Take 1 capsule (0.4 mg total) by mouth daily. Started by: Glendia JAYSON Barba   terazosin  5 MG capsule Commonly known as: HYTRIN  TAKE 1 CAPSULE BY MOUTH DAILY. TAKE IN PLACE OF  DOXAZOSIN .   traMADol 50 MG tablet Commonly known as: ULTRAM Take by mouth.   Trulicity  1.5 MG/0.5ML Soaj Generic drug: Dulaglutide  Inject 1.5 mg into the skin once a week.   VITAMIN B 12 PO Take 1,000 mcg by mouth daily.   VITAMIN D  (CHOLECALCIFEROL) PO Take by mouth.        Allergies:  Allergies  Allergen Reactions   Chlorthalidone  Other (See Comments)    D/c by dr. douglas for worsening renal function 03/2019   Spironolactone Other (See Comments)    gynecomastia    Family History: Family History  Problem Relation Age of Onset   Emphysema Father    Cancer - Lung Father    Cancer - Ovarian Mother        34's   Breast cancer Mother        late 98's    Social History:  reports that he quit smoking about 35 years ago. His smoking use included cigarettes. He started smoking about 60 years ago. He has a 25 pack-year smoking history. His smokeless tobacco use includes chew. He reports that he does not drink alcohol and does not use drugs.   Physical Exam: BP (!) 132/55   Pulse 86   Ht 5' 8 (1.727  m)   Wt 238 lb (108 kg)   BMI 36.19 kg/m   Constitutional:  Alert, No acute distress. HEENT: [place] AT Respiratory: Normal respiratory effort, no increased work of breathing. Psychiatric: Normal mood and affect.   Assessment & Plan:    1. Benign prostatic hyperplasia with LUTS Worsening obstructive voiding symptoms late afternoon.  Hesitant to increase terazosin  due to orthostatic symptoms Will add an a.m. dose of tamsulosin  0.4 mg daily if effective he can tried discontinuing the p.m. terazosin  dose after 30 days Finasteride  refilled PVR today 29 mL Continue annual follow-up    Glendia JAYSON Barba, MD  [facility] [address] [place], [place] [address] [phone]

## 2023-11-25 ENCOUNTER — Encounter: Payer: Self-pay | Admitting: Urology

## 2023-11-26 DIAGNOSIS — N184 Chronic kidney disease, stage 4 (severe): Secondary | ICD-10-CM | POA: Diagnosis not present

## 2023-11-26 DIAGNOSIS — I10 Essential (primary) hypertension: Secondary | ICD-10-CM | POA: Diagnosis not present

## 2023-11-26 DIAGNOSIS — R801 Persistent proteinuria, unspecified: Secondary | ICD-10-CM | POA: Diagnosis not present

## 2023-11-26 DIAGNOSIS — N2581 Secondary hyperparathyroidism of renal origin: Secondary | ICD-10-CM | POA: Diagnosis not present

## 2023-11-26 DIAGNOSIS — E1122 Type 2 diabetes mellitus with diabetic chronic kidney disease: Secondary | ICD-10-CM | POA: Diagnosis not present

## 2023-11-26 DIAGNOSIS — E790 Hyperuricemia without signs of inflammatory arthritis and tophaceous disease: Secondary | ICD-10-CM | POA: Diagnosis not present

## 2023-11-27 ENCOUNTER — Telehealth: Payer: Self-pay

## 2023-11-27 NOTE — Telephone Encounter (Signed)
 Copied from CRM [id number]. Topic: Clinical - Order For Equipment >> Nov 27, 2023 12:45 PM Jacob Herrera wrote: Reason for CRM: PT STATED HIS CPAP MACHINE IS MAKING A BAD NOISE AND IT IS CAUSING HIM NOT GET REST AT NIGHT. HE WOULD LIKE A CALL FROM SOMEONE TO LET HIM KNOW WHAT OPTIONS HE HAS TO REPLACE THIS ONE

## 2023-11-27 NOTE — Telephone Encounter (Signed)
 Reached out to [employer] for clarification.

## 2023-11-27 NOTE — Telephone Encounter (Signed)
 Normally [employer] would check to see if the machine can be repaired. We could place an order to check out his machine

## 2023-11-29 ENCOUNTER — Other Ambulatory Visit: Payer: Self-pay | Admitting: Family Medicine

## 2023-11-29 DIAGNOSIS — E785 Hyperlipidemia, unspecified: Secondary | ICD-10-CM

## 2023-11-29 DIAGNOSIS — I25110 Atherosclerotic heart disease of native coronary artery with unstable angina pectoris: Secondary | ICD-10-CM

## 2023-11-29 NOTE — Telephone Encounter (Signed)
 Joylene Adine Joylene Carlean Lang, Exie PARAS, CMA Hey Darcell Yacoub,  Looks like patinet had an appt scheduled for 11-28-2023 but he canceled it. he will need to call [phone] to reschedule when he is ready.  Thank you,  Arvella New       Previous Messages    ----- Message ----- From: Joylene Adine Sent: 11/29/2023   8:46 AM EDT To: Adine Joylene; Exie PARAS Lang, CMA Subject: RE: CPAP Repair                                Good morning,  This was sent to the local branch for them to contact patient for troubleshoot. I received a confirmation email stating they reviewed it and would take care of it.  Thank you,  Brad New  ----- Message ----- From: Lang Exie PARAS, CMA Sent: 11/29/2023   8:28 AM EDT To: Adine New Subject: RE: CPAP Repair                                Hi Brad,  Any updates? Just wanted to make sure we didn't miss anything on our end.  Thanks,  Z ----- Message ----- From: Joylene Adine Sent: 11/27/2023   2:49 PM EDT To: Adine Joylene; Avelina Sprung; Merilee Colorado;* Subject: RE: CPAP Repair                                Hello Renell Coaxum,  I sent a troublshoot message to the local branch. We should be able to review the pap unit to see whats going on with it. If I hear diffrent I will let you know.  Thank you,  Brad New ----- Message ----- From: Lang Exie PARAS, CMA Sent: 11/27/2023   2:36 PM EDT To: Adine Joylene; Avelina Sprung; Merilee Colorado;* Subject: CPAP Repair                                    Hi Guys,  We have received a messaged from this pt about having some mechanical issues with his CPAP, mostly it making a strange noise while on which disrupts his sleep, and when I spoke to him he said he had contacted [employer] to seek out a repair or replacement, and was told he needs a new order placed before it can be fixed.  Is this correct?  Thanks,  Z

## 2023-11-29 NOTE — Telephone Encounter (Signed)
 Followed up with Brad from [employer] for any updates.

## 2023-12-03 DIAGNOSIS — E210 Primary hyperparathyroidism: Secondary | ICD-10-CM | POA: Diagnosis not present

## 2023-12-03 DIAGNOSIS — N184 Chronic kidney disease, stage 4 (severe): Secondary | ICD-10-CM | POA: Diagnosis not present

## 2023-12-03 DIAGNOSIS — E790 Hyperuricemia without signs of inflammatory arthritis and tophaceous disease: Secondary | ICD-10-CM | POA: Diagnosis not present

## 2023-12-03 DIAGNOSIS — E1122 Type 2 diabetes mellitus with diabetic chronic kidney disease: Secondary | ICD-10-CM | POA: Diagnosis not present

## 2023-12-06 DIAGNOSIS — G4733 Obstructive sleep apnea (adult) (pediatric): Secondary | ICD-10-CM | POA: Diagnosis not present

## 2023-12-06 DIAGNOSIS — J45909 Unspecified asthma, uncomplicated: Secondary | ICD-10-CM | POA: Diagnosis not present

## 2023-12-10 ENCOUNTER — Other Ambulatory Visit: Payer: Self-pay

## 2023-12-10 NOTE — Patient Outreach (Signed)
 Complex Care Management   Visit Note  12/10/2023  Name:  Jacob Herrera MRN: [id number] DOB: [birth year]  Situation: Referral received for Complex Care Management related to {Criteria:32550} I obtained verbal consent from {CHL AMB Patient/Caregiver:28184}.  Visit completed with ***  {VISIT LOCATION:32553}  Background:   Past Medical History:  Diagnosis Date   Asthma    CHF (congestive heart failure) (HCC)    Chronic lower back pain    Complication of anesthesia    unknow difficulty after cardiac cath   Diabetes mellitus, type 2 (HCC)    History of chicken pox    Hx of skin cancer, basal cell    Hypertension    Hypothyroidism    Kidney disease    stage 3   Neuropathy    feet   Scoliosis    Sleep apnea    BiPAP   Thyrotoxicosis    2010    Assessment: Patient Reported Symptoms:  Cognitive        Neurological      HEENT        Cardiovascular      Respiratory      Endocrine      Gastrointestinal        Genitourinary      Integumentary      Musculoskeletal          Psychosocial              09/27/2023   12:50 PM  Depression screen PHQ 2/9  Decreased Interest 0  Down, Depressed, Hopeless 0  PHQ - 2 Score 0    There were no vitals filed for this visit.  Medications Reviewed Today   Medications were not reviewed in this encounter     Recommendation:   {RECOMMENDATONS:32554}  Follow Up Plan:   {FOLLOWUP:32559}  SIG ***

## 2023-12-11 ENCOUNTER — Other Ambulatory Visit: Payer: Self-pay | Admitting: Family Medicine

## 2023-12-14 NOTE — Patient Instructions (Signed)
 Thank you for allowing the Complex Care Management team to participate in your care. It was great speaking with you!  We will follow up on February 01, 2024 at 1230. Please do not hesitate to contact me if you require assistance prior to our next outreach.    Jackson Acron [facility] Population Health RN Care Manager Direct Dial : [phone]  Fax: [phone] Website: [email or web address]

## 2023-12-20 DIAGNOSIS — D485 Neoplasm of uncertain behavior of skin: Secondary | ICD-10-CM | POA: Diagnosis not present

## 2023-12-20 DIAGNOSIS — D0461 Carcinoma in situ of skin of right upper limb, including shoulder: Secondary | ICD-10-CM | POA: Diagnosis not present

## 2023-12-21 ENCOUNTER — Other Ambulatory Visit: Payer: Self-pay | Admitting: Urology

## 2024-01-28 DIAGNOSIS — D2261 Melanocytic nevi of right upper limb, including shoulder: Secondary | ICD-10-CM | POA: Diagnosis not present

## 2024-01-28 DIAGNOSIS — B079 Viral wart, unspecified: Secondary | ICD-10-CM | POA: Diagnosis not present

## 2024-01-28 DIAGNOSIS — C44311 Basal cell carcinoma of skin of nose: Secondary | ICD-10-CM | POA: Diagnosis not present

## 2024-01-28 DIAGNOSIS — D485 Neoplasm of uncertain behavior of skin: Secondary | ICD-10-CM | POA: Diagnosis not present

## 2024-01-28 DIAGNOSIS — C44612 Basal cell carcinoma of skin of right upper limb, including shoulder: Secondary | ICD-10-CM | POA: Diagnosis not present

## 2024-01-28 DIAGNOSIS — Z85820 Personal history of malignant melanoma of skin: Secondary | ICD-10-CM | POA: Diagnosis not present

## 2024-01-28 DIAGNOSIS — D2262 Melanocytic nevi of left upper limb, including shoulder: Secondary | ICD-10-CM | POA: Diagnosis not present

## 2024-01-28 DIAGNOSIS — D2272 Melanocytic nevi of left lower limb, including hip: Secondary | ICD-10-CM | POA: Diagnosis not present

## 2024-01-28 DIAGNOSIS — D225 Melanocytic nevi of trunk: Secondary | ICD-10-CM | POA: Diagnosis not present

## 2024-01-28 DIAGNOSIS — L570 Actinic keratosis: Secondary | ICD-10-CM | POA: Diagnosis not present

## 2024-01-28 DIAGNOSIS — D045 Carcinoma in situ of skin of trunk: Secondary | ICD-10-CM | POA: Diagnosis not present

## 2024-01-28 DIAGNOSIS — Z85828 Personal history of other malignant neoplasm of skin: Secondary | ICD-10-CM | POA: Diagnosis not present

## 2024-01-29 ENCOUNTER — Telehealth: Payer: Self-pay

## 2024-01-29 DIAGNOSIS — G4733 Obstructive sleep apnea (adult) (pediatric): Secondary | ICD-10-CM

## 2024-01-29 NOTE — Telephone Encounter (Signed)
 Copied from CRM [id number]. Topic: Clinical - Medical Advice >> Jan 29, 2024 12:13 PM Chantha C wrote: Reason for CRM: Patient states received an email advised patient to schedule an appointment for 06/2024 with Dr. Isaiah. Informed patient last saw NP, Cobb 07/13/23 and advised in one year. Informed patient, 2026 schedules is not out yet and advised to call back in a few months to schedule.   Patient states cpap machine is acting up, the machine makes noises when turning it on and high pitch noise when using it. Patient has been places pillows on the cpap machine to muffle the noise so he can sleep. Patient brought the cpap machine to [facility] [phone] to be fixed, [facility] representative cannot fix the machine and offered a loaner with monthly fee since patient is not eligible for new cpap machine 2027. Patient is asking for guidances, please advise and call back [phone].   ----------------------------------------------------------------------- From previous Reason for Contact - Scheduling: Patient/patient representative is calling to schedule an appointment. Refer to attachments for appointment information.

## 2024-01-29 NOTE — Telephone Encounter (Signed)
 Please advise.    Per Mitch's note : If the PAP is out of the manufacturer's warranty period and we have done all that we can to repair it, we can offer a loaner until they are eligible for a new device but from what I am hearing is that it has a monthly fee for that loaner.   Thank you,   Arvella Leer

## 2024-01-30 DIAGNOSIS — D0461 Carcinoma in situ of skin of right upper limb, including shoulder: Secondary | ICD-10-CM | POA: Diagnosis not present

## 2024-01-31 NOTE — Telephone Encounter (Signed)
 Patient advised. He requested for a new DME order. ORder placed. NFN.

## 2024-01-31 NOTE — Telephone Encounter (Signed)
 Ok to send new BiPAP order -  BiPAP auto 22/10; PS 6, mask of choice and heated humidity

## 2024-02-01 ENCOUNTER — Telehealth: Payer: Self-pay

## 2024-02-07 NOTE — Telephone Encounter (Signed)
 I received a message from [place] with [employer]  RE: Patient is still needing a replacement Bipap Received: Today New, Bradley   After review of the unit and setup date, we will be contacting the patient for review of the pap unit. It's been determined it is still under warranty at this time. Per branch manager Penne will contact the patient for details.

## 2024-02-12 ENCOUNTER — Other Ambulatory Visit: Payer: Self-pay | Admitting: Family Medicine

## 2024-02-12 DIAGNOSIS — E1121 Type 2 diabetes mellitus with diabetic nephropathy: Secondary | ICD-10-CM

## 2024-02-18 ENCOUNTER — Telehealth: Payer: Self-pay | Admitting: Pharmacist

## 2024-02-18 ENCOUNTER — Telehealth: Payer: Self-pay

## 2024-02-18 ENCOUNTER — Other Ambulatory Visit: Payer: Self-pay | Admitting: Family Medicine

## 2024-02-18 DIAGNOSIS — E1121 Type 2 diabetes mellitus with diabetic nephropathy: Secondary | ICD-10-CM

## 2024-02-18 NOTE — Progress Notes (Signed)
 Brief Telephone Documentation Reason for Call: Patient left message regarding question for pharmacist about PAP re-enrollment  Summary of Call: Patient is currently enrolled in LillyCares PAP and AZ&Me PAP. Patient reports completing the AZ&Me process online for 2026, but will need to complete the LillyCare portion. Will scheduled in-person appointment for 02/21/24 at 2:30pm to complete paperwork.  Follow Up: Patient given direct line for further questions/concerns.  Mirca Yale E. Marsh, PharmD Clinical Pharmacist [facility] [phone]

## 2024-02-18 NOTE — Progress Notes (Signed)
   02/18/2024  Patient ID: Jacob Herrera, male   DOB: [birth year], 73 y.o.   MRN: [id number]  Received voicemail from the patient regarding patient assistance application updates and wanting to get those items set-up for next year.  Sending message to office pharmacist Allyson about [employer] and AZ&ME application.   Aloysius Lewis, PharmD, [facility] Phone Number: [phone]

## 2024-02-18 NOTE — Telephone Encounter (Signed)
 Need to know name of pharmacy. There is no pharmacy with that phone number in our database so I'm not able to transmit the prescription.

## 2024-02-18 NOTE — Addendum Note (Signed)
 Addended by: GASPER NANCYANN BRAVO on: 02/18/2024 04:57 PM   Modules accepted: Orders

## 2024-02-18 NOTE — Telephone Encounter (Signed)
 Copied from CRM [id number]. Topic: Clinical - Prescription Issue >> Feb 18, 2024  1:06 PM Zebedee SAUNDERS wrote: Reason for CRM: Pt called stated Insulin  Glargine (BASAGLAR  KWIKPEN) 100 UNIT/ML pt is using 15 in morning and 15 in the evening , 30 per day and [facility] shows using 20 to 24 per day. Please have Dr. Gasper needs to send to [facility] phone:  [phone] new prescription.

## 2024-02-19 ENCOUNTER — Other Ambulatory Visit: Payer: Self-pay | Admitting: Urology

## 2024-02-19 ENCOUNTER — Telehealth: Payer: Self-pay

## 2024-02-19 ENCOUNTER — Other Ambulatory Visit (HOSPITAL_COMMUNITY): Payer: Self-pay

## 2024-02-19 MED ORDER — BASAGLAR KWIKPEN 100 UNIT/ML ~~LOC~~ SOPN: 15.0000 [IU] | PEN_INJECTOR | Freq: Two times a day (BID) | SUBCUTANEOUS | 4 refills | Status: DC

## 2024-02-19 NOTE — Telephone Encounter (Signed)
 PAP app for LillyCares has been faxed to the office, would you like me to email it to you as well? Thanks

## 2024-02-19 NOTE — Addendum Note (Signed)
 Addended by: GASPER NANCYANN BRAVO on: 02/19/2024 05:19 PM   Modules accepted: Orders

## 2024-02-19 NOTE — Telephone Encounter (Signed)
 You got it.

## 2024-02-19 NOTE — Telephone Encounter (Signed)
 PAP: Patient application pending due to Faxed to PCP office, ATTN: RPH for upcoming appt. Dated 02/21/2024

## 2024-02-21 ENCOUNTER — Ambulatory Visit

## 2024-02-21 DIAGNOSIS — E1121 Type 2 diabetes mellitus with diabetic nephropathy: Secondary | ICD-10-CM

## 2024-02-21 DIAGNOSIS — Z7985 Long-term (current) use of injectable non-insulin antidiabetic drugs: Secondary | ICD-10-CM | POA: Diagnosis not present

## 2024-02-21 DIAGNOSIS — Z794 Long term (current) use of insulin: Secondary | ICD-10-CM | POA: Diagnosis not present

## 2024-02-21 MED ORDER — BASAGLAR KWIKPEN 100 UNIT/ML ~~LOC~~ SOPN: 26.0000 [IU] | PEN_INJECTOR | Freq: Every day | SUBCUTANEOUS | 2 refills | Status: DC

## 2024-02-21 NOTE — Patient Instructions (Signed)
 Please adjust your Basaglar  26 units once daily in the morning

## 2024-02-21 NOTE — Progress Notes (Signed)
 S:     Reason for visit: ?  Jacob Herrera is a 73 y.o. male with a history of diabetes (type 2), who presents today for an initial diabetes Face to Face pharmacotherapy visit.? Pertinent PMH also includes CAD, OSA, BPH, GERD, hypothyroidism, HTN, basal cell carcinoma, CKD stage 4, HFpEF.  Care Team: Primary Care Provider: Gasper Nancyann BRAVO, MD  Today, Jacob Herrera reports taking Humalog  at 6 units with small meals and 9 units with larger meals. He reports that he does not wake up when his BG will go overnight.   Current diabetes medications include: [name]  10 mg daily, Trulicity  1.5 mg weekly, Basaglar  15 units BID, Humalog  6-10 units TID  Previous diabetes medications include: Ozempic , pioglitazone, Januvia, metformin  Current hypertension medications include: losartan  100 mg daily, carvedilol  25 mg BID, hydralazine  50 mg TID, bumetanide 2 mg BID, metolazone 5 mg once a week Current hyperlipidemia medications include: rosuvastatin  20 mg daily  Patient reports adherence to taking all medications as prescribed.   Have you been experiencing any side effects to the medications prescribed? no Do you have any problems obtaining medications due to transportation or finances? no Insurance coverage: [employer]  Current medication access support:  [name]  via AZ&Me until 2026 Trulicity , Basaglar , and Humalog  via LilyCares  Patient reports hypoglycemic events.  Patient reported dietary habits: Eats 2 meals/day -reports he normally eats breakfast and lunch, with one as a small meals and the other a large meal  DM Prevention:  Statin: Taking; high intensity.?  ACE/ARB: yes; losartan  100 mg History of chronic kidney disease? yes Last urinary albumin/creatinine ratio:  Lab Results  Component Value Date   MICRALBCREAT 174 mg/g creat 07/23/2023   MICRALBCREAT 1,070 08/23/2021   Last eye exam:  Lab Results  Component Value Date   HMDIABEYEEXA No Retinopathy 08/23/2023   Lab  Results  Component Value Date   HMDIABEYEEXA No Retinopathy 08/23/2023   Last foot exam: No foot exam found Tobacco Use:  Tobacco Use: High Risk (11/25/2023)   Patient History    Smoking Tobacco Use: Former    Smokeless Tobacco Use: Current    Passive Exposure: Not on file   O:  LibreView Report   Vitals:  Wt Readings from Last 3 Encounters:  11/21/23 238 lb (108 kg)  11/16/23 238 lb (108 kg)  07/13/23 238 lb (108 kg)   BP Readings from Last 3 Encounters:  11/21/23 (!) 132/55  11/16/23 131/60  08/20/23 124/65   Pulse Readings from Last 3 Encounters:  11/21/23 86  11/16/23 70  07/13/23 72     Labs:?  Lab Results  Component Value Date   HGBA1C 6.9 (H) 11/16/2023   HGBA1C 6.4 (A) 07/09/2023   HGBA1C 6.4 (A) 03/19/2023   GLUCOSE 152 (H) 04/14/2022   MICRALBCREAT 174 mg/g creat 07/23/2023   MICRALBCREAT 1,070 08/23/2021   CREATININE 3.0 (A) 08/21/2023   CREATININE 2.19 (H) 04/14/2022   CREATININE 2.19 (H) 12/09/2021    Lab Results  Component Value Date   CHOL 118 11/16/2023   LDLCALC 25 11/16/2023   LDLCALC 50 04/14/2022   LDLCALC 61 12/09/2021   HDL 27 (L) 11/16/2023   TRIG 480 (H) 11/16/2023   TRIG 285 (H) 04/14/2022   TRIG 325 (H) 12/09/2021   ALT 22 04/14/2022   ALT 23 12/09/2021   AST 22 04/14/2022   AST 24 12/09/2021      Chemistry      Component Value Date/Time   NA 136 (A) 08/21/2023  0000   NA 136 07/08/2013 0555   K 3.6 08/21/2023 0000   K 4.0 07/08/2013 0555   CL 99 08/21/2023 0000   CL 102 07/08/2013 0555   CO2 25 (A) 08/21/2023 0000   CO2 31 07/08/2013 0555   BUN 61 (A) 08/21/2023 0000   BUN 20 (H) 07/08/2013 0555   CREATININE 3.0 (A) 08/21/2023 0000   CREATININE 2.19 (H) 04/14/2022 1526   CREATININE 1.36 (H) 07/08/2013 0555   GLU 150 08/21/2023 0000      Component Value Date/Time   CALCIUM  10.4 08/21/2023 0000   CALCIUM  9.3 07/08/2013 0555   ALKPHOS 55 04/14/2022 1526   ALKPHOS 53 07/04/2013 1226   AST 22 04/14/2022 1526    AST 32 07/04/2013 1226   ALT 22 04/14/2022 1526   ALT 34 07/04/2013 1226   BILITOT 0.2 04/14/2022 1526   BILITOT 0.4 07/04/2013 1226       The ASCVD Risk score (Arnett DK, et al., 2019) failed to calculate for the following reasons:   The valid total cholesterol range is 130 to 320 mg/dL  Lab Results  Component Value Date   MICRALBCREAT 174 mg/g creat 07/23/2023   MICRALBCREAT 1,070 08/23/2021    A/P: Diabetes currently controlled with a most recent A1c of 6.9% on 11/16/23. Patient is able to verbalize appropriate hypoglycemia management plan, however, he reports that he does not normally wake up if he goes low overnight. Medication adherence appears optimal.  Will switch basal insulin  from BID to morning administration and reduce dose to decrease the incidence of overnight hypoglycemia. -Decreased basal insulin  Basaglar  (insulin  glargine)  from 15 units BID to 26 units once daily.  -Continued rapid insulin  Novolog  (insulin  aspart) 6 units with small meals and 9 units with large meals.  -Continued GLP-1 Trulicity  (dulaglutide ) 1.5 mg weekly -Continued SGLT2-I [name]  (dapagliflozin ) 10 mg daily.  -Patient educated on purpose, proper use, and potential adverse effects of insulin  therapies.  -Extensively discussed pathophysiology of diabetes, recommended lifestyle interventions, dietary effects on blood sugar control.  -Counseled on s/sx of and management of hypoglycemia.  -Patient signed paperwork in office for re-enrollment for LilyCares (Trulicity , Basaglar , and Humalog ). Faxed documents to PAP team.   ASCVD risk - secondary prevention in patient with diabetes. Last LDL is 25 mg/dL, not at goal of <44 mg/dL.  -Continued rosuvastatin  20 mg daily.    Patient verbalized understanding of treatment plan. Total time patient counseling 45 minutes.  Follow-up:  Pharmacist on 04/09/24 PCP clinic visit on 03/17/24  Jacob Herrera, PharmD, CPP Clinical Pharmacist [facility] [phone]

## 2024-02-22 ENCOUNTER — Telehealth: Payer: Self-pay

## 2024-02-22 MED ORDER — INSULIN LISPRO (1 UNIT DIAL) 100 UNIT/ML (KWIKPEN): 6.0000 [IU] | PEN_INJECTOR | Freq: Three times a day (TID) | SUBCUTANEOUS | 3 refills | Status: AC

## 2024-02-22 MED ORDER — DAPAGLIFLOZIN PROPANEDIOL 10 MG PO TABS: 10.0000 mg | ORAL_TABLET | Freq: Every day | ORAL | 4 refills | Status: AC

## 2024-02-22 MED ORDER — TRULICITY 1.5 MG/0.5ML ~~LOC~~ SOAJ: 1.5000 mg | SUBCUTANEOUS | 3 refills | Status: DC

## 2024-02-22 NOTE — Addendum Note (Signed)
 Addended by: ISSAC PEYTON BRAVO on: 02/22/2024 08:39 AM   Modules accepted: Orders

## 2024-02-22 NOTE — Telephone Encounter (Signed)
 Received pt portion fill and sign along proof of income from [facility], filled and faxed provider portion for provider to sign and date and fax it back.

## 2024-02-25 DIAGNOSIS — J45909 Unspecified asthma, uncomplicated: Secondary | ICD-10-CM | POA: Diagnosis not present

## 2024-02-25 DIAGNOSIS — G4733 Obstructive sleep apnea (adult) (pediatric): Secondary | ICD-10-CM | POA: Diagnosis not present

## 2024-02-26 DIAGNOSIS — E790 Hyperuricemia without signs of inflammatory arthritis and tophaceous disease: Secondary | ICD-10-CM | POA: Diagnosis not present

## 2024-02-26 DIAGNOSIS — N184 Chronic kidney disease, stage 4 (severe): Secondary | ICD-10-CM | POA: Diagnosis not present

## 2024-02-26 DIAGNOSIS — E210 Primary hyperparathyroidism: Secondary | ICD-10-CM | POA: Diagnosis not present

## 2024-02-26 LAB — CBC: RBC: 3.27 — AB (ref 3.87–5.11)

## 2024-02-26 LAB — BASIC METABOLIC PANEL WITH GFR
BUN: 79 — AB (ref 4–21)
CO2: 24 — AB (ref 13–22)
Chloride: 97 — AB (ref 99–108)
Creatinine: 3.4 — AB (ref 0.6–1.3)
Glucose: 179
Potassium: 3.8 meq/L (ref 3.5–5.1)
Sodium: 135 — AB (ref 137–147)

## 2024-02-26 LAB — COMPREHENSIVE METABOLIC PANEL WITH GFR
Albumin: 4.1 (ref 3.5–5.0)
Calcium: 10.6 (ref 8.7–10.7)
eGFR: 18

## 2024-02-26 LAB — MICROALBUMIN, URINE: MICROALB/CREAT RATIO: 223

## 2024-02-26 LAB — CBC AND DIFFERENTIAL
HCT: 31 — AB (ref 41–53)
Hemoglobin: 10.1 — AB (ref 13.5–17.5)
Neutrophils Absolute: 4438
Platelets: 225 K/uL (ref 150–400)
WBC: 7.8

## 2024-02-26 LAB — VITAMIN D 25 HYDROXY (VIT D DEFICIENCY, FRACTURES): Vit D, 25-Hydroxy: 49

## 2024-02-27 ENCOUNTER — Other Ambulatory Visit (HOSPITAL_COMMUNITY): Payer: Self-pay

## 2024-02-27 NOTE — Telephone Encounter (Signed)
 Received pt and provider potion [employer] Humalog  today faxed to [employer] today.

## 2024-02-28 NOTE — Telephone Encounter (Signed)
 Received provider portion [employer] Trulicity  and Humalog , faxed to [employer] along pt portion and proof of income

## 2024-03-03 ENCOUNTER — Other Ambulatory Visit (HOSPITAL_COMMUNITY): Payer: Self-pay

## 2024-03-03 NOTE — Telephone Encounter (Signed)
 Received approval letter from [facility] on [employer] (Humalog   and Basaglar ) approval letter index left a HIPAA VM at pt number.

## 2024-03-04 DIAGNOSIS — E210 Primary hyperparathyroidism: Secondary | ICD-10-CM | POA: Diagnosis not present

## 2024-03-04 DIAGNOSIS — E1122 Type 2 diabetes mellitus with diabetic chronic kidney disease: Secondary | ICD-10-CM | POA: Diagnosis not present

## 2024-03-04 DIAGNOSIS — N189 Chronic kidney disease, unspecified: Secondary | ICD-10-CM | POA: Diagnosis not present

## 2024-03-04 DIAGNOSIS — E790 Hyperuricemia without signs of inflammatory arthritis and tophaceous disease: Secondary | ICD-10-CM | POA: Diagnosis not present

## 2024-03-08 ENCOUNTER — Other Ambulatory Visit: Payer: Self-pay | Admitting: Family Medicine

## 2024-03-17 ENCOUNTER — Ambulatory Visit (INDEPENDENT_AMBULATORY_CARE_PROVIDER_SITE_OTHER): Admitting: Family Medicine

## 2024-03-17 ENCOUNTER — Encounter: Payer: Self-pay | Admitting: Family Medicine

## 2024-03-17 VITALS — BP 129/66 | HR 64 | Ht 68.0 in | Wt 234.1 lb

## 2024-03-17 DIAGNOSIS — E1121 Type 2 diabetes mellitus with diabetic nephropathy: Secondary | ICD-10-CM

## 2024-03-17 DIAGNOSIS — N184 Chronic kidney disease, stage 4 (severe): Secondary | ICD-10-CM | POA: Diagnosis not present

## 2024-03-17 DIAGNOSIS — I5032 Chronic diastolic (congestive) heart failure: Secondary | ICD-10-CM

## 2024-03-17 DIAGNOSIS — E1122 Type 2 diabetes mellitus with diabetic chronic kidney disease: Secondary | ICD-10-CM | POA: Diagnosis not present

## 2024-03-17 DIAGNOSIS — I1A0 Resistant hypertension: Secondary | ICD-10-CM

## 2024-03-17 DIAGNOSIS — Z7985 Long-term (current) use of injectable non-insulin antidiabetic drugs: Secondary | ICD-10-CM | POA: Diagnosis not present

## 2024-03-17 LAB — POCT GLYCOSYLATED HEMOGLOBIN (HGB A1C): Hemoglobin A1C: 7 % — AB (ref 4.0–5.6)

## 2024-03-17 MED ORDER — FREESTYLE LIBRE 3 PLUS SENSOR MISC: 3 refills | Status: AC

## 2024-03-17 NOTE — Patient Instructions (Signed)
 SABRA  Please review the attached list of medications and notify my office if there are any errors.   . Please bring all of your medications to every appointment so we can make sure that our medication list is the same as yours.

## 2024-03-17 NOTE — Progress Notes (Signed)
 Established patient visit   Patient: Jacob Herrera   DOB: 1951-04-02   73 y.o. Male  MRN: [id number] Visit Date: 03/17/2024  Today's healthcare provider: Nancyann Perry, MD    Subjective    Discussed the use of AI scribe software for clinical note transcription with the patient, who gave verbal consent to proceed.  History of Present Illness   Jacob Herrera is a 73 year old male with diabetes who presents for a check-up on his blood sugar levels.  He is currently taking Trulicity  for four to five years without any side effects. He also takes Basaglar , which he has adjusted to 27 units in the morning, eliminating the need for an evening dose. Additionally, he uses Humalog , though the specific dose was not mentioned. He has experienced some low blood sugar episodes, primarily while asleep, but these were noted by someone else, and he was unaware of them. His A1c has increased slightly from 6.9 in July to 7.0 currently. His diet and weight have remained stable, and he has even lost some weight, yet his blood sugar levels have been rising.  His blood pressure medication, hydralazine , was reduced from 100 mg to 50 mg several months ago and is now taken twice a day instead of three times. He experienced episodes of lightheadedness in the morning after taking his medications. His blood pressure was noted to be 129/60, which he considers good. He takes multiple medications throughout the day, including a handful in the morning, evening, and at night.  He has received his flu shot at a [facility] in early October. He also mentions being out of stock on some medications, specifically Rebrae and Sensorol, and is awaiting refills.     Lab Results  Component Value Date   HGBA1C 6.9 (H) 11/16/2023   HGBA1C 6.4 (A) 07/09/2023   HGBA1C 6.4 (A) 03/19/2023   Wt Readings from Last 3 Encounters:  03/17/24 234 lb 1.6 oz (106.2 kg)  11/21/23 238 lb (108 kg)  11/16/23 238 lb (108 kg)   BP  Readings from Last 3 Encounters:  03/17/24 129/66  11/21/23 (!) 132/55  11/16/23 131/60   Lab Results  Component Value Date   NA 135 (A) 02/26/2024   K 3.8 02/26/2024   CREATININE 3.4 (A) 02/26/2024   EGFR 18 02/26/2024   GLUCOSE 152 (H) 04/14/2022     Medications: Outpatient Medications Prior to Visit  Medication Sig   hydrALAZINE  (APRESOLINE ) 50 MG tablet Take 50 mg by mouth.   acetaminophen  (TYLENOL ) 500 MG tablet Take 500 mg by mouth every 6 (six) hours as needed.   albuterol  (PROVENTIL ) (2.5 MG/3ML) 0.083% nebulizer solution Take 3 mLs (2.5 mg total) by nebulization every 4 (four) hours. And PRN (Patient taking differently: Take 2.5 mg by nebulization as needed.)   Albuterol -Budesonide  (AIRSUPRA ) 90-80 MCG/ACT AERO Inhale 1-2 puffs into the lungs every 6 (six) hours as needed (shortness of breath or wheezing).   allopurinol (ZYLOPRIM) 100 MG tablet Take by mouth.   aspirin  81 MG tablet Take 81 mg by mouth daily.   BD PEN NEEDLE NANO ULTRAFINE 32G X 4 MM MISC USE EVERY DAY AS DIRECTED   bumetanide (BUMEX) 2 MG tablet Take 2 mg by mouth 2 (two) times daily.   carvedilol  (COREG ) 25 MG tablet TAKE 1 TABLET BY MOUTH TWICE DAILY   cetirizine (ZYRTEC) 10 MG tablet Take 10 mg by mouth daily.   Cyanocobalamin (VITAMIN B 12 PO) Take 1,000 mcg by  mouth daily.    dapagliflozin  propanediol ([name] ) 10 MG TABS tablet Take 1 tablet (10 mg total) by mouth daily.   Dulaglutide  (TRULICITY ) 1.5 MG/0.5ML SOAJ Inject 1.5 mg into the skin once a week.   erythromycin ophthalmic ointment Apply 1 a small amount into right eye every night   finasteride  (PROSCAR ) 5 MG tablet Take 1 tablet (5 mg total) by mouth daily.   gabapentin  (NEURONTIN ) 600 MG tablet TAKE 1 TABLET BY MOUTH 2 TIMES DAILY   glucose blood (ONETOUCH ULTRA) test strip USE AS DIRECTED THREE TIMES A DAY   hydrALAZINE  (APRESOLINE ) 50 MG tablet Take 50 mg by mouth 3 (three) times daily.   Insulin  Glargine (BASAGLAR  KWIKPEN) 100 UNIT/ML  Inject 26 Units into the skin daily.   insulin  lispro (HUMALOG  KWIKPEN) 100 UNIT/ML KwikPen Inject 6-10 Units into the skin 3 (three) times daily. Sliding scale.   isosorbide mononitrate (IMDUR) 60 MG 24 hr tablet TAKE 1 TABLET BY MOUTH DAILY   levothyroxine  (SYNTHROID ) 125 MCG tablet TAKE 1 TABLET EVERY DAY ON EMPTY STOMACHWITH A GLASS OF WATER AT LEAST 30-60 MINBEFORE BREAKFAST   losartan  (COZAAR ) 100 MG tablet Take 1 tablet (100 mg total) by mouth daily.   Lysine 500 MG CAPS Take 1 capsule by mouth daily.   metolazone (ZAROXOLYN) 5 MG tablet Take 5 mg by mouth once a week.   pantoprazole  (PROTONIX ) 40 MG tablet Take 1 tablet (40 mg total) by mouth daily. (Patient taking differently: Take 40 mg by mouth daily as needed.)   rosuvastatin  (CRESTOR ) 20 MG tablet TAKE ONE TABLET BY MOUTH DAILY   senna (SENOKOT) 8.6 MG tablet Take by mouth.   tamsulosin  (FLOMAX ) 0.4 MG CAPS capsule TAKE 1 CAPSULE BY MOUTH ONCE DAILY   terazosin  (HYTRIN ) 5 MG capsule TAKE 1 CAPSULE BY MOUTH DAILY. TAKE IN PLACE OF DOXAZOSIN .   traMADol (ULTRAM) 50 MG tablet Take by mouth.   VITAMIN D , CHOLECALCIFEROL, PO Take by mouth.   [DISCONTINUED] Continuous Glucose Sensor (FREESTYLE LIBRE 3 PLUS SENSOR) MISC Change sensor every 15 days.   No facility-administered medications prior to visit.   Review of Systems  Constitutional:  Negative for appetite change, chills and fever.  Respiratory:  Negative for chest tightness, shortness of breath and wheezing.   Cardiovascular:  Negative for chest pain and palpitations.  Gastrointestinal:  Negative for abdominal pain, nausea and vomiting.       Objective    BP 129/66 (BP Location: Left Arm, Patient Position: Sitting, Cuff Size: Large)   Pulse 64   Ht 5' 8 (1.727 m)   Wt 234 lb 1.6 oz (106.2 kg)   SpO2 98%   BMI 35.59 kg/m   Physical Exam   General: Appearance:    Obese male in no acute distress  Eyes:    PERRL, conjunctiva/corneas clear, EOM's intact       Lungs:      Clear to auscultation bilaterally, respirations unlabored  Heart:    Normal heart rate. Normal rhythm.  2/6 systolic murmur   MS:   All extremities are intact.    Neurologic:   Awake, alert, oriented x 3. No apparent focal neurological defect.            Assessment & Plan    1. Diabetes mellitus with nephropathy (HCC) (Primary) A1c creeping up. Continue current medications.  Consider increasing Trulicity  if continues to rise at follow up in 4 months.   Refill Continuous Glucose Sensor (FREESTYLE LIBRE 3 PLUS SENSOR) MISC; Change  sensor every 15 days.  Dispense: 6 each; Refill: 3  Continue follow up nephrology   2. Chronic diastolic heart failure (HCC) Well compesated on current medications. Continue routine cardiology follow up .  3. Resistant hypertension Well controlled. Primarily managed by nephrology.   4. Chronic kidney disease, stage IV (severe) (HCC) Stable. Continue routine follow up nephrology.   A1c=7.0  Return in about 4 months (around 07/15/2024).     Nancyann Perry, MD  [facility] [phone] (phone) [phone] (fax)  [facility]

## 2024-03-24 ENCOUNTER — Other Ambulatory Visit: Payer: Self-pay

## 2024-03-24 NOTE — Patient Outreach (Unsigned)
 Complex Care Management   Visit Note  03/24/2024  Name:  Jacob Herrera MRN: [id number] DOB: October 06, 1950  Situation: Referral received for Complex Care Management related to {Criteria:32550} I obtained verbal consent from {CHL AMB Patient/Caregiver:28184}.  Visit completed with {CHL AMB Patient/Caregiver:28184}  {VISIT LOCATION:32553}  Background:   Past Medical History:  Diagnosis Date   Asthma    CHF (congestive heart failure) (HCC)    Chronic lower back pain    Complication of anesthesia    unknow difficulty after cardiac cath   Diabetes mellitus, type 2 (HCC)    History of chicken pox    Hx of skin cancer, basal cell    Hypertension    Hypothyroidism    Kidney disease    stage 3   Neuropathy    feet   Scoliosis    Sleep apnea    BiPAP   Thyrotoxicosis    2010    Assessment: Patient Reported Symptoms:  Cognitive        Neurological      HEENT        Cardiovascular      Respiratory      Endocrine      Gastrointestinal        Genitourinary      Integumentary      Musculoskeletal          Psychosocial            03/24/2024    PHQ2-9 Depression Screening   Little interest or pleasure in doing things    Feeling down, depressed, or hopeless    PHQ-2 - Total Score    Trouble falling or staying asleep, or sleeping too much    Feeling tired or having little energy    Poor appetite or overeating     Feeling bad about yourself - or that you are a failure or have let yourself or your family down    Trouble concentrating on things, such as reading the newspaper or watching television    Moving or speaking so slowly that other people could have noticed.  Or the opposite - being so fidgety or restless that you have been moving around a lot more than usual    Thoughts that you would be better off dead, or hurting yourself in some way    PHQ2-9 Total Score    If you checked off any problems, how difficult have these problems made it for you to do  your work, take care of things at home, or get along with other people    Depression Interventions/Treatment      There were no vitals filed for this visit.    Medications Reviewed Today     Reviewed by Karoline Lima, RN (Registered Nurse) on 03/24/24 at 1235  Med List Status: <None>   Medication Order Taking? Sig Documenting Provider Last Dose Status Informant  acetaminophen  (TYLENOL ) 500 MG tablet [id number]  Take 500 mg by mouth every 6 (six) hours as needed. [provider]  Active   albuterol  (PROVENTIL ) (2.5 MG/3ML) 0.083% nebulizer solution [id number]  Take 3 mLs (2.5 mg total) by nebulization every 4 (four) hours. And PRN  Patient taking differently: Take 2.5 mg by nebulization as needed.   Kasa, Kurian, MD  Active            Med Note [name], [name]   Mon Feb 26, 2023  2:02 PM) Reports using as needed  Albuterol -Budesonide  (AIRSUPRA ) 90-80 MCG/ACT AERO [id number]  Inhale 1-2 puffs  into the lungs every 6 (six) hours as needed (shortness of breath or wheezing). Malachy Comer GAILS, NP  Active   allopurinol (ZYLOPRIM) 100 MG tablet [id number]  Take by mouth. [provider]  Active   aspirin  81 MG tablet [id number]  Take 81 mg by mouth daily. [provider]  Active   BD PEN NEEDLE NANO ULTRAFINE 32G X 4 MM MISC [id number]  USE EVERY DAY AS DIRECTED Gasper Nancyann BRAVO, MD  Active   bumetanide (BUMEX) 2 MG tablet [id number]  Take 2 mg by mouth 2 (two) times daily. [provider]  Active   carvedilol  (COREG ) 25 MG tablet [id number]  TAKE 1 TABLET BY MOUTH TWICE DAILY Gasper Nancyann BRAVO, MD  Active   cetirizine (ZYRTEC) 10 MG tablet [id number]  Take 10 mg by mouth daily. [provider]  Active   Continuous Glucose Sensor (FREESTYLE LIBRE 3 PLUS SENSOR) MISC [id number]  Change sensor every 15 days. Gasper Nancyann BRAVO, MD  Active   Cyanocobalamin (VITAMIN B 12 PO) [id number]  Take 1,000 mcg by mouth daily.  [provider]  Active Self   dapagliflozin  propanediol ([name] ) 10 MG TABS tablet [id number]  Take 1 tablet (10 mg total) by mouth daily. Gasper Nancyann BRAVO, MD  Active   Dulaglutide  (TRULICITY ) 1.5 MG/0.5ML EMMANUEL [id number]  Inject 1.5 mg into the skin once a week. Gasper Nancyann BRAVO, MD  Active   erythromycin ophthalmic ointment [id number]  Apply 1 a small amount into right eye every night [provider]  Active   finasteride  (PROSCAR ) 5 MG tablet [id number]  Take 1 tablet (5 mg total) by mouth daily. Twylla Glendia BROCKS, MD  Active   gabapentin  (NEURONTIN ) 600 MG tablet [id number]  TAKE 1 TABLET BY MOUTH 2 TIMES DAILY Gasper Nancyann BRAVO, MD  Active   glucose blood [facility] ULTRA) test strip [id number]  USE AS DIRECTED THREE TIMES A DAY Gasper Nancyann BRAVO, MD  Active   hydrALAZINE  (APRESOLINE ) 50 MG tablet [id number]  Take 50 mg by mouth 3 (three) times daily. [provider]  Active   hydrALAZINE  (APRESOLINE ) 50 MG tablet [id number]  Take 50 mg by mouth. [provider]  Active   Insulin  Glargine (BASAGLAR  KWIKPEN) 100 UNIT/ML [id number] Yes Inject 26 Units into the skin daily.  Patient taking differently: Inject 26 Units into the skin daily.   Gasper Nancyann BRAVO, MD  Active   insulin  lispro (HUMALOG  KWIKPEN) 100 UNIT/ML KwikPen [id number]  Inject 6-10 Units into the skin 3 (three) times daily. Sliding scale. Gasper Nancyann BRAVO, MD  Active   isosorbide mononitrate (IMDUR) 60 MG 24 hr tablet [id number]  TAKE 1 TABLET BY MOUTH DAILY Gasper Nancyann BRAVO, MD  Active   levothyroxine  (SYNTHROID ) 125 MCG tablet [id number]  TAKE 1 TABLET EVERY DAY ON EMPTY STOMACHWITH A GLASS OF WATER AT LEAST 30-60 MINBEFORE BREAKFAST Gasper Nancyann BRAVO, MD  Active   losartan  (COZAAR ) 100 MG tablet [id number]  Take 1 tablet (100 mg total) by mouth daily. Bosie Vinie LABOR, MD  Active   Lysine 500 MG CAPS [id number]  Take 1 capsule by mouth daily. [provider]  Active   metolazone (ZAROXOLYN) 5 MG tablet [id number]  Take 5 mg by mouth once a  week. [provider]  Active            Med Note [name], [name]   Mon Aug 20, 2023  1:27 PM) Reports Nephrologist reduced dose to once a week.  pantoprazole  (PROTONIX ) 40  MG tablet [id number]  Take 1 tablet (40 mg total) by mouth daily.  Patient taking differently: Take 40 mg by mouth daily as needed.   Gasper Nancyann BRAVO, MD  Active   rosuvastatin  (CRESTOR ) 20 MG tablet [id number]  TAKE ONE TABLET BY MOUTH DAILY Gasper Nancyann BRAVO, MD  Active   senna (SENOKOT) 8.6 MG tablet [id number]  Take by mouth. [provider]  Active Self  tamsulosin  (FLOMAX ) 0.4 MG CAPS capsule [id number]  TAKE 1 CAPSULE BY MOUTH ONCE DAILY Stoioff, Scott C, MD  Active   terazosin  (HYTRIN ) 5 MG capsule [id number]  TAKE 1 CAPSULE BY MOUTH DAILY. TAKE IN PLACE OF DOXAZOSIN . Gasper Nancyann BRAVO, MD  Active   traMADol (ULTRAM) 50 MG tablet [id number]  Take by mouth. [provider]  Active   VITAMIN D , CHOLECALCIFEROL, PO [id number]  Take by mouth. [provider]  Active   Med List Note Durenda Madelin BROCKS, RN 03/31/15 1507): UDS done 08-11-14 Meds to last until 10-09-14            Recommendation:   {RECOMMENDATONS:32554}  Follow Up Plan:   {FOLLOWUP:32559}  SIG ***

## 2024-03-25 DIAGNOSIS — D045 Carcinoma in situ of skin of trunk: Secondary | ICD-10-CM | POA: Diagnosis not present

## 2024-03-25 DIAGNOSIS — C44612 Basal cell carcinoma of skin of right upper limb, including shoulder: Secondary | ICD-10-CM | POA: Diagnosis not present

## 2024-03-26 NOTE — Patient Instructions (Signed)
 Thank you for allowing the Complex Care Management team to participate in your care. It was great speaking with you!  Keep up the great work managing your care! Please do not hesitate to notify Dr. Gasper if your health changes and you require assistance. The care team will gladly assist.    Jackson Acron [facility] RN Care Manager Direct Dial : [phone]  Fax: [phone] Website: [email or web address]

## 2024-04-02 DIAGNOSIS — E782 Mixed hyperlipidemia: Secondary | ICD-10-CM | POA: Diagnosis not present

## 2024-04-02 DIAGNOSIS — I5033 Acute on chronic diastolic (congestive) heart failure: Secondary | ICD-10-CM | POA: Diagnosis not present

## 2024-04-02 DIAGNOSIS — G4733 Obstructive sleep apnea (adult) (pediatric): Secondary | ICD-10-CM | POA: Diagnosis not present

## 2024-04-02 DIAGNOSIS — I10 Essential (primary) hypertension: Secondary | ICD-10-CM | POA: Diagnosis not present

## 2024-04-02 DIAGNOSIS — I25118 Atherosclerotic heart disease of native coronary artery with other forms of angina pectoris: Secondary | ICD-10-CM | POA: Diagnosis not present

## 2024-04-09 ENCOUNTER — Other Ambulatory Visit

## 2024-04-09 DIAGNOSIS — Z794 Long term (current) use of insulin: Secondary | ICD-10-CM

## 2024-04-09 DIAGNOSIS — E1121 Type 2 diabetes mellitus with diabetic nephropathy: Secondary | ICD-10-CM

## 2024-04-09 MED ORDER — BASAGLAR KWIKPEN 100 UNIT/ML ~~LOC~~ SOPN: 25.0000 [IU] | PEN_INJECTOR | Freq: Every day | SUBCUTANEOUS | Status: DC

## 2024-04-09 MED ORDER — TRULICITY 3 MG/0.5ML ~~LOC~~ SOAJ: 3.0000 mg | SUBCUTANEOUS | 3 refills | Status: DC

## 2024-04-09 NOTE — Progress Notes (Signed)
 S:     Reason for visit: ?  Jacob Herrera is a 73 y.o. male with a history of diabetes (type 2), who presents today for an initial diabetes Face to Face pharmacotherapy visit.? Pertinent PMH also includes CAD, OSA, BPH, GERD, hypothyroidism, HTN, basal cell carcinoma, CKD stage 4, HFpEF.  Care Team: Primary Care Provider: Gasper Nancyann BRAVO, MD  At last visit with clinical pharmacist on 02/21/24, patient reported some overnight hypoglycemic events, during which time he does not normally notice/wake up for. Basaglar  was decreased from 15 units BID and 26 units daily.   Since last visit, patient denies any additional hypoglycemic events and none were seen on his CGM report.   Current diabetes medications include: [name]  10 mg daily, Trulicity  1.5 mg weekly, Basaglar  26 units daily (taking 27 units), Humalog  9 units with breakfast and  6 units with lunch  Previous diabetes medications include: Ozempic , pioglitazone, Januvia, metformin  Current hypertension medications include: losartan  100 mg daily, carvedilol  25 mg BID, hydralazine  50 mg once daily, bumetanide 2 mg BID, metolazone 5 mg once a week Current hyperlipidemia medications include: rosuvastatin  20 mg daily  Patient reports adherence to taking all medications as prescribed.   Have you been experiencing any side effects to the medications prescribed? no Do you have any problems obtaining medications due to transportation or finances? no Insurance coverage: [employer]  Current medication access support:  [name]  via [employer] until 2026 Trulicity , Basaglar , and Humalog  via [employer]  Patient denies hypoglycemic events.   Patient reported dietary habits: Eats 2 meals/day -reports he normally eats breakfast and lunch, with one as a small meals and the other a large meal  DM Prevention:  Statin: Taking; high intensity.?  ACE/ARB: yes; losartan  100 mg History of chronic kidney disease? yes Last urinary  albumin/creatinine ratio:  Lab Results  Component Value Date   MICRALBCREAT 223 02/26/2024   MICRALBCREAT 174 07/23/2023   MICRALBCREAT 1,070 08/23/2021   Last eye exam:  Lab Results  Component Value Date   HMDIABEYEEXA No Retinopathy 08/23/2023   Lab Results  Component Value Date   HMDIABEYEEXA No Retinopathy 08/23/2023   Last foot exam: No foot exam found Tobacco Use:  Tobacco Use: High Risk (03/24/2024)   Patient History    Smoking Tobacco Use: Former    Smokeless Tobacco Use: Current    Passive Exposure: Not on file   O:  [employer] Report   Vitals:  Wt Readings from Last 3 Encounters:  03/17/24 234 lb 1.6 oz (106.2 kg)  11/21/23 238 lb (108 kg)  11/16/23 238 lb (108 kg)   BP Readings from Last 3 Encounters:  03/17/24 129/66  11/21/23 (!) 132/55  11/16/23 131/60   Pulse Readings from Last 3 Encounters:  03/17/24 64  11/21/23 86  11/16/23 70     Labs:?  Lab Results  Component Value Date   HGBA1C 7.0 (A) 03/17/2024   HGBA1C 6.9 (H) 11/16/2023   HGBA1C 6.4 (A) 07/09/2023   GLUCOSE 152 (H) 04/14/2022   MICRALBCREAT 223 02/26/2024   MICRALBCREAT 174 07/23/2023   MICRALBCREAT 1,070 08/23/2021   CREATININE 3.4 (A) 02/26/2024   CREATININE 3.0 (A) 08/21/2023   CREATININE 2.19 (H) 04/14/2022    Lab Results  Component Value Date   CHOL 118 11/16/2023   LDLCALC 25 11/16/2023   LDLCALC 50 04/14/2022   LDLCALC 61 12/09/2021   HDL 27 (L) 11/16/2023   TRIG 480 (H) 11/16/2023   TRIG 285 (H) 04/14/2022   TRIG 325 (  H) 12/09/2021   ALT 22 04/14/2022   ALT 23 12/09/2021   AST 22 04/14/2022   AST 24 12/09/2021      Chemistry      Component Value Date/Time   NA 135 (A) 02/26/2024 0000   NA 136 07/08/2013 0555   K 3.8 02/26/2024 0000   K 4.0 07/08/2013 0555   CL 97 (A) 02/26/2024 0000   CL 102 07/08/2013 0555   CO2 24 (A) 02/26/2024 0000   CO2 31 07/08/2013 0555   BUN 79 (A) 02/26/2024 0000   BUN 20 (H) 07/08/2013 0555   CREATININE 3.4 (A) 02/26/2024  0000   CREATININE 2.19 (H) 04/14/2022 1526   CREATININE 1.36 (H) 07/08/2013 0555   GLU 179 02/26/2024 0000      Component Value Date/Time   CALCIUM  10.6 02/26/2024 0000   CALCIUM  9.3 07/08/2013 0555   ALKPHOS 55 04/14/2022 1526   ALKPHOS 53 07/04/2013 1226   AST 22 04/14/2022 1526   AST 32 07/04/2013 1226   ALT 22 04/14/2022 1526   ALT 34 07/04/2013 1226   BILITOT 0.2 04/14/2022 1526   BILITOT 0.4 07/04/2013 1226       The ASCVD Risk score (Arnett DK, et al., 2019) failed to calculate for the following reasons:   The valid total cholesterol range is 130 to 320 mg/dL  Lab Results  Component Value Date   MICRALBCREAT 223 02/26/2024   MICRALBCREAT 174 07/23/2023   MICRALBCREAT 1,070 08/23/2021    A/P: Diabetes currently controlled with a most recent A1c of 7% on 03/17/24. Patient is able to verbalize appropriate hypoglycemia management plan. Medication adherence appears optimal.  No additional hypoglycemic events since last visit. Will plan to increase Trulicity  at this time. Given hx of hypoglycemic unawareness, will decrease total daily insulin  dose by ~15%. -Decreased basal insulin  Basaglar  (insulin  glargine) 27 units once daily to 25 units once daily  -Decreased dose of rapid insulin  Humalog  (insulin  lispro) 6 units with breakfast and 4 units with lunch.  -Increased dose of GLP-1 Trulicity  (dulaglutide ) 3 mg weekly Fridays -Continued SGLT2-I [name]  (dapagliflozin ) 10 mg daily.  -Patient educated on purpose, proper use, and potential adverse effects of insulin  therapies.  -Extensively discussed pathophysiology of diabetes, recommended lifestyle interventions, dietary effects on blood sugar control.  -Counseled on s/sx of and management of hypoglycemia.   ASCVD risk - secondary prevention in patient with diabetes. Last LDL is 25 mg/dL, at goal of <44 mg/dL.  -Continued rosuvastatin  20 mg daily.    Patient verbalized understanding of treatment plan. Total time patient  counseling 45 minutes.  Follow-up:  Pharmacist on 05/08/24 PCP clinic visit on 07/16/24  Peyton CHARLENA Ferries, PharmD, CPP Clinical Pharmacist [facility] [phone]

## 2024-04-18 ENCOUNTER — Other Ambulatory Visit: Payer: Self-pay | Admitting: Family Medicine

## 2024-04-18 DIAGNOSIS — I1A0 Resistant hypertension: Secondary | ICD-10-CM

## 2024-05-08 ENCOUNTER — Other Ambulatory Visit (INDEPENDENT_AMBULATORY_CARE_PROVIDER_SITE_OTHER)

## 2024-05-08 DIAGNOSIS — Z794 Long term (current) use of insulin: Secondary | ICD-10-CM

## 2024-05-08 DIAGNOSIS — E1121 Type 2 diabetes mellitus with diabetic nephropathy: Secondary | ICD-10-CM

## 2024-05-08 NOTE — Progress Notes (Unsigned)
 "  S:     Reason for visit: ?  Jacob Herrera is a 74 y.o. male with a history of diabetes (type 2), who presents today for an initial diabetes Face to Face pharmacotherapy visit.? Pertinent PMH also includes CAD, OSA, BPH, GERD, hypothyroidism, HTN, basal cell carcinoma, CKD stage 4, HFpEF.  Care Team: Primary Care Provider: Gasper Nancyann BRAVO, MD  Today, patient reports he is surprised that his Trulicity  dose increase from last visit did end up reducing his insulin  requirement.   Current diabetes medications include: [name]  10 mg daily, Trulicity  3 mg weekly (Friday), Basaglar  25 units daily, Humalog  6 units with breakfast and  4 units with lunch  Previous diabetes medications include: Ozempic , pioglitazone, Januvia, metformin  Current hypertension medications include: losartan  100 mg daily, carvedilol  25 mg BID, hydralazine  50 mg once daily, bumetanide 2 mg BID, metolazone 5 mg once a week Current hyperlipidemia medications include: rosuvastatin  20 mg daily  Patient reports adherence to taking all medications as prescribed.   Have you been experiencing any side effects to the medications prescribed? no Do you have any problems obtaining medications due to transportation or finances? no Insurance coverage: [employer]  Current medication access support:  [name]  via [employer] until 04/23/25 Trulicity , Basaglar , and Humalog  via [employer] until 04/23/25  Patient denies hypoglycemic events.   Patient reported dietary habits: Eats 2 meals/day -reports he normally eats breakfast and lunch, with one as a small meals and the other a large meal  DM Prevention:  Statin: Taking; high intensity.?  ACE/ARB: yes; losartan  100 mg History of chronic kidney disease? yes Last urinary albumin/creatinine ratio:  Lab Results  Component Value Date   MICRALBCREAT 223 02/26/2024   MICRALBCREAT 174 07/23/2023   MICRALBCREAT 1,070 08/23/2021   Last eye exam:  Lab Results  Component Value  Date   HMDIABEYEEXA No Retinopathy 08/23/2023   Lab Results  Component Value Date   HMDIABEYEEXA No Retinopathy 08/23/2023   Last foot exam: No foot exam found Tobacco Use:  Tobacco Use: High Risk (05/05/2024)   Received from [facility]   Patient History    Smoking Tobacco Use: Former    Smokeless Tobacco Use: Current    Passive Exposure: Not on file   O:  [employer] Report    Vitals:  Wt Readings from Last 3 Encounters:  03/17/24 234 lb 1.6 oz (106.2 kg)  11/21/23 238 lb (108 kg)  11/16/23 238 lb (108 kg)   BP Readings from Last 3 Encounters:  03/17/24 129/66  11/21/23 (!) 132/55  11/16/23 131/60   Pulse Readings from Last 3 Encounters:  03/17/24 64  11/21/23 86  11/16/23 70     Labs:?  Lab Results  Component Value Date   HGBA1C 7.0 (A) 03/17/2024   HGBA1C 6.9 (H) 11/16/2023   HGBA1C 6.4 (A) 07/09/2023   GLUCOSE 152 (H) 04/14/2022   MICRALBCREAT 223 02/26/2024   MICRALBCREAT 174 07/23/2023   MICRALBCREAT 1,070 08/23/2021   CREATININE 3.4 (A) 02/26/2024   CREATININE 3.0 (A) 08/21/2023   CREATININE 2.19 (H) 04/14/2022    Lab Results  Component Value Date   CHOL 118 11/16/2023   LDLCALC 25 11/16/2023   LDLCALC 50 04/14/2022   LDLCALC 61 12/09/2021   HDL 27 (L) 11/16/2023   TRIG 480 (H) 11/16/2023   TRIG 285 (H) 04/14/2022   TRIG 325 (H) 12/09/2021   ALT 22 04/14/2022   ALT 23 12/09/2021   AST 22 04/14/2022   AST 24 12/09/2021  Chemistry      Component Value Date/Time   NA 135 (A) 02/26/2024 0000   NA 136 07/08/2013 0555   K 3.8 02/26/2024 0000   K 4.0 07/08/2013 0555   CL 97 (A) 02/26/2024 0000   CL 102 07/08/2013 0555   CO2 24 (A) 02/26/2024 0000   CO2 31 07/08/2013 0555   BUN 79 (A) 02/26/2024 0000   BUN 20 (H) 07/08/2013 0555   CREATININE 3.4 (A) 02/26/2024 0000   CREATININE 2.19 (H) 04/14/2022 1526   CREATININE 1.36 (H) 07/08/2013 0555   GLU 179 02/26/2024 0000      Component Value Date/Time   CALCIUM  10.6 02/26/2024  0000   CALCIUM  9.3 07/08/2013 0555   ALKPHOS 55 04/14/2022 1526   ALKPHOS 53 07/04/2013 1226   AST 22 04/14/2022 1526   AST 32 07/04/2013 1226   ALT 22 04/14/2022 1526   ALT 34 07/04/2013 1226   BILITOT 0.2 04/14/2022 1526   BILITOT 0.4 07/04/2013 1226       The ASCVD Risk score (Arnett DK, et al., 2019) failed to calculate for the following reasons:   The valid total cholesterol range is 130 to 320 mg/dL  Lab Results  Component Value Date   MICRALBCREAT 223 02/26/2024   MICRALBCREAT 174 07/23/2023   MICRALBCREAT 1,070 08/23/2021    A/P: Diabetes currently controlled with a most recent A1c of 7% on 03/17/24. Patient is able to verbalize appropriate hypoglycemia management plan. Medication adherence appears optimal.  No additional hypoglycemic events since last visit. Patient would like to finish up another month of his old Trulicity  stock and then discuss a dose adjustment. Fasting BG is normally around ~150 mg/dL and BG remains a bit elevated post-prandially on CGM report. Will make a small adjustment to both basal and bolus insulin . Will likely need to decrease dose at follow up when Trulicity  is increased -Increased dose of basal insulin  Basaglar  (insulin  glargine) 25 units once daily to 28 units once daily  -Increased dose of rapid insulin  Humalog  (insulin  lispro) 6 units with breakfast and  lunch.  -Continued GLP-1 Trulicity  (dulaglutide ) 3 mg weekly. Sent new Rx for 4.5 mg weekly to discuss initiation at follow up.  -Continued SGLT2-I [name]  (dapagliflozin ) 10 mg daily.  -Patient educated on purpose, proper use, and potential adverse effects of insulin  therapies.  -Extensively discussed pathophysiology of diabetes, recommended lifestyle interventions, dietary effects on blood sugar control.  -Counseled on s/sx of and management of hypoglycemia.   ASCVD risk - secondary prevention in patient with diabetes. Last LDL is 25 mg/dL, at goal of <44 mg/dL.  -Continued rosuvastatin   20 mg daily.    Patient verbalized understanding of treatment plan. Total time patient counseling 45 minutes.  Follow-up:  Pharmacist on 06/12/24 PCP clinic visit on 07/16/24  Peyton CHARLENA Ferries, PharmD, BCACP, CPP Clinical Pharmacist [facility] [phone]    "

## 2024-05-09 MED ORDER — TRULICITY 4.5 MG/0.5ML ~~LOC~~ SOAJ: 4.5000 mg | SUBCUTANEOUS | 3 refills | Status: AC

## 2024-05-09 MED ORDER — BASAGLAR KWIKPEN 100 UNIT/ML ~~LOC~~ SOPN: 28.0000 [IU] | PEN_INJECTOR | Freq: Every day | SUBCUTANEOUS | Status: AC

## 2024-06-12 ENCOUNTER — Other Ambulatory Visit

## 2024-07-16 ENCOUNTER — Ambulatory Visit: Admitting: Family Medicine

## 2024-07-18 ENCOUNTER — Ambulatory Visit: Admitting: Family Medicine

## 2024-11-24 ENCOUNTER — Ambulatory Visit: Admitting: Urology
